# Patient Record
Sex: Female | Born: 1960 | Hispanic: Yes | Marital: Married | Smoking: Never smoker
Health system: Southern US, Community
[De-identification: ages and names within clinical notes are randomized; demographics above are authoritative.]

## PROBLEM LIST (undated history)

## (undated) DIAGNOSIS — IMO0002 Reserved for concepts with insufficient information to code with codable children: Secondary | ICD-10-CM

## (undated) DIAGNOSIS — K750 Abscess of liver: Secondary | ICD-10-CM

## (undated) DIAGNOSIS — M8580 Other specified disorders of bone density and structure, unspecified site: Secondary | ICD-10-CM

## (undated) DIAGNOSIS — K219 Gastro-esophageal reflux disease without esophagitis: Secondary | ICD-10-CM

## (undated) DIAGNOSIS — R0789 Other chest pain: Secondary | ICD-10-CM

## (undated) DIAGNOSIS — R590 Localized enlarged lymph nodes: Secondary | ICD-10-CM

## (undated) DIAGNOSIS — S161XXA Strain of muscle, fascia and tendon at neck level, initial encounter: Secondary | ICD-10-CM

## (undated) DIAGNOSIS — E119 Type 2 diabetes mellitus without complications: Secondary | ICD-10-CM

## (undated) DIAGNOSIS — G43909 Migraine, unspecified, not intractable, without status migrainosus: Secondary | ICD-10-CM

## (undated) DIAGNOSIS — R739 Hyperglycemia, unspecified: Secondary | ICD-10-CM

## (undated) DIAGNOSIS — K839 Disease of biliary tract, unspecified: Secondary | ICD-10-CM

## (undated) DIAGNOSIS — I10 Essential (primary) hypertension: Secondary | ICD-10-CM

## (undated) DIAGNOSIS — K635 Polyp of colon: Secondary | ICD-10-CM

## (undated) DIAGNOSIS — E7800 Pure hypercholesterolemia, unspecified: Secondary | ICD-10-CM

## (undated) DIAGNOSIS — S3613XA Injury of bile duct, initial encounter: Secondary | ICD-10-CM

## (undated) DIAGNOSIS — K2270 Barrett's esophagus without dysplasia: Secondary | ICD-10-CM

## (undated) DIAGNOSIS — R112 Nausea with vomiting, unspecified: Secondary | ICD-10-CM

## (undated) DIAGNOSIS — K812 Acute cholecystitis with chronic cholecystitis: Secondary | ICD-10-CM

## (undated) DIAGNOSIS — Z9889 Other specified postprocedural states: Secondary | ICD-10-CM

## (undated) HISTORY — DX: Hyperglycemia, unspecified: R73.9

## (undated) HISTORY — DX: Strain of muscle, fascia and tendon at neck level, initial encounter: S16.1XXA

## (undated) HISTORY — DX: Barrett's esophagus without dysplasia: K22.70

## (undated) HISTORY — DX: Polyp of colon: K63.5

## (undated) HISTORY — PX: BLADDER SURGERY: SHX569

## (undated) HISTORY — DX: Other chest pain: R07.89

## (undated) HISTORY — PX: BREAST CYST ASPIRATION: SHX578

## (undated) HISTORY — DX: Abscess of liver: K75.0

## (undated) HISTORY — DX: Acute cholecystitis with chronic cholecystitis: K81.2

## (undated) HISTORY — DX: Pure hypercholesterolemia, unspecified: E78.00

## (undated) HISTORY — DX: Localized enlarged lymph nodes: R59.0

## (undated) HISTORY — DX: Gastro-esophageal reflux disease without esophagitis: K21.9

## (undated) HISTORY — DX: Other specified disorders of bone density and structure, unspecified site: M85.80

## (undated) HISTORY — DX: Injury of bile duct, initial encounter: S36.13XA

## (undated) HISTORY — DX: Reserved for concepts with insufficient information to code with codable children: IMO0002

## (undated) HISTORY — DX: Disease of biliary tract, unspecified: K83.9

## (undated) HISTORY — DX: Essential (primary) hypertension: I10

---

## 2006-06-20 LAB — CONVERTED CEMR LAB: Pap Smear: NORMAL

## 2007-02-20 HISTORY — PX: ABDOMINAL HYSTERECTOMY: SHX81

## 2008-02-20 DIAGNOSIS — IMO0002 Reserved for concepts with insufficient information to code with codable children: Secondary | ICD-10-CM

## 2008-02-20 HISTORY — DX: Reserved for concepts with insufficient information to code with codable children: IMO0002

## 2008-02-20 HISTORY — PX: COLONOSCOPY W/ POLYPECTOMY: SHX1380

## 2009-02-19 HISTORY — PX: COLONOSCOPY W/ POLYPECTOMY: SHX1380

## 2009-06-06 ENCOUNTER — Encounter (INDEPENDENT_AMBULATORY_CARE_PROVIDER_SITE_OTHER): Payer: Self-pay | Admitting: *Deleted

## 2009-06-14 LAB — HM COLONOSCOPY: HM Colonoscopy: NORMAL

## 2009-06-16 ENCOUNTER — Emergency Department (HOSPITAL_BASED_OUTPATIENT_CLINIC_OR_DEPARTMENT_OTHER): Admission: EM | Admit: 2009-06-16 | Discharge: 2009-06-16 | Payer: Self-pay | Admitting: Emergency Medicine

## 2009-06-17 ENCOUNTER — Ambulatory Visit: Payer: Self-pay | Admitting: Family

## 2009-06-28 ENCOUNTER — Ambulatory Visit: Payer: Self-pay | Admitting: Family

## 2009-07-04 ENCOUNTER — Ambulatory Visit: Payer: Self-pay | Admitting: Family

## 2009-07-04 DIAGNOSIS — E559 Vitamin D deficiency, unspecified: Secondary | ICD-10-CM | POA: Insufficient documentation

## 2009-07-04 DIAGNOSIS — Z9189 Other specified personal risk factors, not elsewhere classified: Secondary | ICD-10-CM | POA: Insufficient documentation

## 2009-07-04 LAB — CONVERTED CEMR LAB
ALT: 15 units/L (ref 0–35)
AST: 17 units/L (ref 0–37)
Albumin: 4.5 g/dL (ref 3.5–5.2)
Alkaline Phosphatase: 92 units/L (ref 39–117)
BUN: 13 mg/dL (ref 6–23)
Basophils Absolute: 0 10*3/uL (ref 0.0–0.1)
Basophils Relative: 1 % (ref 0–1)
CO2: 28 meq/L (ref 19–32)
Calcium: 10.1 mg/dL (ref 8.4–10.5)
Chloride: 105 meq/L (ref 96–112)
Cholesterol: 149 mg/dL (ref 0–200)
Creatinine, Ser: 0.6 mg/dL (ref 0.40–1.20)
Eosinophils Absolute: 0.1 10*3/uL (ref 0.0–0.7)
Eosinophils Relative: 3 % (ref 0–5)
Glucose, Bld: 99 mg/dL (ref 70–99)
HCT: 40.1 % (ref 36.0–46.0)
HDL: 58 mg/dL (ref >39)
Hemoglobin: 13.1 g/dL (ref 12.0–15.0)
LDL Cholesterol: 71 mg/dL (ref 0–99)
Lymphocytes Relative: 45 % (ref 12–46)
Lymphs Abs: 1.7 10*3/uL (ref 0.7–4.0)
MCHC: 32.7 g/dL (ref 30.0–36.0)
MCV: 83.5 fL (ref 78.0–100.0)
Monocytes Absolute: 0.2 10*3/uL (ref 0.1–1.0)
Monocytes Relative: 5 % (ref 3–12)
Neutro Abs: 1.8 10*3/uL (ref 1.7–7.7)
Neutrophils Relative %: 47 % (ref 43–77)
Platelets: 317 10*3/uL (ref 150–400)
Potassium: 5 meq/L (ref 3.5–5.3)
RBC: 4.8 M/uL (ref 3.87–5.11)
RDW: 13.4 % (ref 11.5–15.5)
Sodium: 141 meq/L (ref 135–145)
TSH: 0.573 microintl units/mL (ref 0.350–4.500)
Total Bilirubin: 0.5 mg/dL (ref 0.3–1.2)
Total CHOL/HDL Ratio: 2.6
Total Protein: 7.4 g/dL (ref 6.0–8.3)
Triglycerides: 101 mg/dL (ref <150)
VLDL: 20 mg/dL (ref 0–40)
Vit D, 1,25-Dihydroxy: 23 — ABNORMAL LOW (ref 30–89)
WBC: 3.8 10*3/uL — ABNORMAL LOW (ref 4.0–10.5)

## 2009-07-05 ENCOUNTER — Encounter (INDEPENDENT_AMBULATORY_CARE_PROVIDER_SITE_OTHER): Payer: Self-pay | Admitting: *Deleted

## 2009-07-05 ENCOUNTER — Telehealth: Payer: Self-pay | Admitting: Family

## 2009-07-06 ENCOUNTER — Telehealth: Payer: Self-pay | Admitting: Family

## 2009-07-11 ENCOUNTER — Encounter: Payer: Self-pay | Admitting: Family

## 2009-07-11 DIAGNOSIS — Z8711 Personal history of peptic ulcer disease: Secondary | ICD-10-CM | POA: Insufficient documentation

## 2009-07-11 DIAGNOSIS — Z8719 Personal history of other diseases of the digestive system: Secondary | ICD-10-CM | POA: Insufficient documentation

## 2009-07-11 DIAGNOSIS — M8580 Other specified disorders of bone density and structure, unspecified site: Secondary | ICD-10-CM | POA: Insufficient documentation

## 2009-07-25 ENCOUNTER — Telehealth: Payer: Self-pay | Admitting: Family

## 2009-08-26 ENCOUNTER — Encounter: Payer: Self-pay | Admitting: Family

## 2009-08-26 LAB — HM MAMMOGRAPHY: HM Mammogram: NORMAL

## 2009-08-30 ENCOUNTER — Encounter: Payer: Self-pay | Admitting: Family

## 2009-09-01 ENCOUNTER — Encounter: Payer: Self-pay | Admitting: Cardiology

## 2009-09-01 ENCOUNTER — Emergency Department (HOSPITAL_BASED_OUTPATIENT_CLINIC_OR_DEPARTMENT_OTHER)
Admission: EM | Admit: 2009-09-01 | Discharge: 2009-09-01 | Payer: Self-pay | Source: Home / Self Care | Admitting: Emergency Medicine

## 2009-09-05 ENCOUNTER — Telehealth: Payer: Self-pay | Admitting: Family

## 2009-09-14 ENCOUNTER — Encounter: Payer: Self-pay | Admitting: Cardiology

## 2009-09-14 ENCOUNTER — Ambulatory Visit: Payer: Self-pay | Admitting: Cardiology

## 2009-09-26 ENCOUNTER — Ambulatory Visit: Payer: Self-pay | Admitting: Family

## 2009-09-26 DIAGNOSIS — E785 Hyperlipidemia, unspecified: Secondary | ICD-10-CM | POA: Insufficient documentation

## 2009-09-26 LAB — CONVERTED CEMR LAB
ALT: 18 units/L (ref 0–35)
AST: 22 units/L (ref 0–37)
Albumin: 4.5 g/dL (ref 3.5–5.2)
Alkaline Phosphatase: 91 units/L (ref 39–117)
Bilirubin, Direct: 0.1 mg/dL (ref 0.0–0.3)
Cholesterol: 196 mg/dL (ref 0–200)
HDL: 53 mg/dL (ref >39)
Indirect Bilirubin: 0.3 mg/dL (ref 0.0–0.9)
LDL Cholesterol: 127 mg/dL — ABNORMAL HIGH (ref 0–99)
Total Bilirubin: 0.4 mg/dL (ref 0.3–1.2)
Total CHOL/HDL Ratio: 3.7
Total Protein: 7.1 g/dL (ref 6.0–8.3)
Triglycerides: 79 mg/dL (ref <150)
VLDL: 16 mg/dL (ref 0–40)
Vit D, 1,25-Dihydroxy: 22 — ABNORMAL LOW (ref 30–89)

## 2009-09-27 ENCOUNTER — Telehealth: Payer: Self-pay | Admitting: Family

## 2009-10-07 ENCOUNTER — Telehealth: Payer: Self-pay | Admitting: Family

## 2009-10-10 ENCOUNTER — Ambulatory Visit: Payer: Self-pay | Admitting: Internal Medicine

## 2009-10-10 ENCOUNTER — Encounter: Payer: Self-pay | Admitting: Family

## 2009-10-12 ENCOUNTER — Telehealth: Payer: Self-pay | Admitting: Family

## 2009-11-01 ENCOUNTER — Telehealth: Payer: Self-pay | Admitting: Family

## 2009-11-07 ENCOUNTER — Telehealth: Payer: Self-pay | Admitting: Family

## 2009-11-23 ENCOUNTER — Ambulatory Visit: Payer: Self-pay | Admitting: Family

## 2009-11-23 DIAGNOSIS — S139XXA Sprain of joints and ligaments of unspecified parts of neck, initial encounter: Secondary | ICD-10-CM | POA: Insufficient documentation

## 2009-11-23 LAB — CONVERTED CEMR LAB: Vit D, 1,25-Dihydroxy: 50 (ref 30–89)

## 2009-11-25 ENCOUNTER — Telehealth: Payer: Self-pay | Admitting: Family

## 2010-01-02 ENCOUNTER — Encounter: Payer: Self-pay | Admitting: Internal Medicine

## 2010-01-02 ENCOUNTER — Ambulatory Visit: Payer: Self-pay | Admitting: Family

## 2010-01-02 DIAGNOSIS — R1013 Epigastric pain: Secondary | ICD-10-CM | POA: Insufficient documentation

## 2010-01-02 LAB — CONVERTED CEMR LAB
ALT: 10 units/L (ref 0–35)
AST: 16 units/L (ref 0–37)
Albumin: 4.3 g/dL (ref 3.5–5.2)
Alkaline Phosphatase: 90 units/L (ref 39–117)
Amylase: 46 units/L (ref 0–105)
Basophils Absolute: 0 10*3/uL (ref 0.0–0.1)
Basophils Relative: 1 % (ref 0–1)
Bilirubin, Direct: 0.1 mg/dL (ref 0.0–0.3)
Eosinophils Absolute: 0 10*3/uL (ref 0.0–0.7)
Eosinophils Relative: 0 % (ref 0–5)
HCT: 39.6 % (ref 36.0–46.0)
Hemoglobin: 12.9 g/dL (ref 12.0–15.0)
Indirect Bilirubin: 0.3 mg/dL (ref 0.0–0.9)
Lipase: 25 units/L (ref 0–75)
Lymphocytes Relative: 47 % — ABNORMAL HIGH (ref 12–46)
Lymphs Abs: 1.9 10*3/uL (ref 0.7–4.0)
MCHC: 32.6 g/dL (ref 30.0–36.0)
MCV: 85.7 fL (ref 78.0–100.0)
Monocytes Absolute: 0.3 10*3/uL (ref 0.1–1.0)
Monocytes Relative: 7 % (ref 3–12)
Neutro Abs: 1.9 10*3/uL (ref 1.7–7.7)
Neutrophils Relative %: 46 % (ref 43–77)
Platelets: 303 10*3/uL (ref 150–400)
RBC: 4.62 M/uL (ref 3.87–5.11)
RDW: 14 % (ref 11.5–15.5)
Total Bilirubin: 0.4 mg/dL (ref 0.3–1.2)
Total Protein: 7 g/dL (ref 6.0–8.3)
WBC: 4.2 10*3/uL (ref 4.0–10.5)

## 2010-01-03 ENCOUNTER — Telehealth: Payer: Self-pay | Admitting: Family

## 2010-01-04 ENCOUNTER — Encounter: Payer: Self-pay | Admitting: Family

## 2010-01-05 ENCOUNTER — Telehealth: Payer: Self-pay | Admitting: Internal Medicine

## 2010-01-25 ENCOUNTER — Telehealth: Payer: Self-pay | Admitting: Family

## 2010-01-25 DIAGNOSIS — Z860100 Personal history of colon polyps, unspecified: Secondary | ICD-10-CM | POA: Insufficient documentation

## 2010-01-25 DIAGNOSIS — I10 Essential (primary) hypertension: Secondary | ICD-10-CM | POA: Insufficient documentation

## 2010-01-25 DIAGNOSIS — K219 Gastro-esophageal reflux disease without esophagitis: Secondary | ICD-10-CM | POA: Insufficient documentation

## 2010-01-25 DIAGNOSIS — K5900 Constipation, unspecified: Secondary | ICD-10-CM | POA: Insufficient documentation

## 2010-01-30 ENCOUNTER — Ambulatory Visit: Payer: Self-pay | Admitting: Internal Medicine

## 2010-02-03 ENCOUNTER — Ambulatory Visit: Payer: Self-pay | Admitting: Internal Medicine

## 2010-02-07 ENCOUNTER — Encounter: Payer: Self-pay | Admitting: Internal Medicine

## 2010-03-21 NOTE — Progress Notes (Signed)
  Phone Note Outgoing Call   Summary of Call: Please call patient and let her know that her bone density does show some bone thinning (osteopenia) not yet ostoporosis.  She should continue calcium supplements, vitamin D, and try to incorporate weight bearing exercises into her daily routine.  She will need a follow up test in 2 years.   Initial call taken by: Lemont Fillers FNP,  November 01, 2009 4:38 PM      Preventive Care Screening     dexa scan 10/10/09 Osteopenia- femoral neck R -1.1, left -1.4, spine -1.3   Appended Document:  Left message on machine to return my call.   Appended Document:  Pt notified and requests instructions be mailed to her. Instructions mailed.

## 2010-03-21 NOTE — Assessment & Plan Note (Signed)
Summary: 3 MONTH FOLLOW UP/DK--Rm 4   Vital Signs:  Patient profile:   50 year old female Height:      61 inches Weight:      152.25 pounds BMI:     28.87 Temp:     98.3 degrees F oral Pulse rate:   72 / minute Pulse rhythm:   regular Resp:     16 per minute BP sitting:   110 / 80  (right arm) Cuff size:   regular  Vitals Entered By: Mervin Kung CMA Duncan Dull) (September 26, 2009 9:19 AM) CC: Room 4  3 month follow up, fasting.  Pt  states she has been having pain in the back of her neck, has taken percocet in the past with relief. Is Patient Diabetic? No Comments Pt needs refill on Lactulose. States she takes Vitamin D when she remembers it. Nicki Guadalajara Fergerson CMA Duncan Dull)  September 26, 2009 9:23 AM    Primary Care Provider:  Lemont Fillers FNP  CC:  Room 4  3 month follow up, fasting.  Pt  states she has been having pain in the back of her neck, and has taken percocet in the past with relief..  History of Present Illness: Ms Tsang is a 50 year old female who presents today for follow up. She reports that since last visit she was seen in the ED for a UTI, but her symptoms have resolved.   1)Atypical CP- improved- scheduled for stress echo with Dr. Jens Som.  2)Vitamin D Deficiency-  Notes that she sometimes forgets to take her vitamin D supplement.  Feels like it makes her dizzy at times.    3)GERD-  reports rare symptoms of reflux,  occasionally notices when she forgets to take omeprazole  Allergies: 1)  ! Codeine  Past History:  Past Medical History: Last updated: 09/14/2009 OSTEOPENIA BARRETT'S ESOPHAGUS, HX OF  VITAMIN D DEFICIENCY CERVICAL LYMPHADENOPATHY, LEFT  Ulcer--2010 Hypercholesterolemia Urine Incontinence ?PFO  Past Surgical History: Last updated: 06/17/2009 Bladder Mesh surgery?-- 2010, Dr. Noland Fordyce, Colon polypectomy--2010 Hysterectomy--2009, Dr. Ferdinand Cava  Family History: Last updated: 09/14/2009 diabetes--sister colon  cancer--father PE--mother,deceased HTN--brother,sister, father, mother Hypercholesterolemia--brother,sister,father,mother Mouth cancer--paternal grandmother Thyroid problem-- 1/62 sister 60 year old daughter-healthy 20-  healthy.   Brother with MI at age 51 Father with MI at age 53  Social History: Last updated: 07/04/2009 Married- is guardian for two grandchildren ages 25 and 7. Never Smoked Alcohol use-no Regular exercise-no Works in housekeeping at [employer]  Risk Factors: Alcohol Use: 0 (06/17/2009) Caffeine Use: 1 cup (06/17/2009) Exercise: no (06/17/2009)  Risk Factors: Smoking Status: never (06/17/2009)  Physical Exam  General:  Well-developed,well-nourished,in no acute distress; alert,appropriate and cooperative throughout examination Head:  Normocephalic and atraumatic without obvious abnormalities. No apparent alopecia or balding. Abdomen:  Bowel sounds positive,abdomen soft and non-tender without masses, organomegaly or hernias noted. Extremities:  No clubbing, cyanosis, edema, or deformity noted with normal full range of motion of all joints.     Impression & Recommendations:  Problem # 1:  OSTEOPENIA (ICD-733.90) Assessment Comment Only Due for dexa scan.  Will order, will also have patient add a daily calcium supplement.  Problem # 2:  VITAMIN D DEFICIENCY (ICD-268.9) Assessment: Comment Only Check vitamin D Level Orders: T-Vitamin D (25-Hydroxy) (04540-98119)  Problem # 3:  CHEST PAIN, ATYPICAL, HX OF (ICD-V15.89) Assessment: Improved Plan for stress echo- per Dr. Jens Som.    Problem # 4:  HYPERLIPIDEMIA (ICD-272.4) Assessment: Comment Only Check flp today. Recently started pravastatin in  place of Simvastatin.   Her updated medication list for this problem includes:    Pravastatin Sodium 40 Mg Tabs (Pravastatin sodium) ..... One tablet by mouth daily at bedtime  Orders: TLB-Lipid Panel (80061-LIPID) TLB-Hepatic/Liver Function Pnl  (80076-HEPATIC)  Complete Medication List: 1)  Omeprazole 40 Mg Cpdr (Omeprazole) .... Take 1 tablet by mouth once a day 2)  Pravastatin Sodium 40 Mg Tabs (Pravastatin sodium) .... One tablet by mouth daily at bedtime 3)  Lactulose 10 Gm/73ml Soln (Lactulose) .Marland Kitchen.. 1 tblsp daily 4)  Ergocalciferol 50000 Unit Caps (Ergocalciferol) .... One cap by mouth once weekly for 12 weeks 5)  Caltrate 600+d 600-400 Mg-unit Tabs (Calcium carbonate-vitamin d) .... One tablet by mouth twice daily  Patient Instructions: 1)  Please arrange a bone density scan at the front desk on your way out. (733.90) 2)  Please complete your lab work downstairs today. 3)  Follow up in 3 months.  Prescriptions: PRAVASTATIN SODIUM 40 MG TABS (PRAVASTATIN SODIUM) one tablet by mouth daily at bedtime  #30 x 5   Entered and Authorized by:   Lemont Fillers FNP   Signed by:   Lemont Fillers FNP on 09/26/2009   Method used:   Electronically to        [facility] ([facility]ail)       [address]       [place], [place]  [address]       Ph: [phone]       Fax: [phone]   RxID:   [id number]    Current Allergies (reviewed today): ! CODEINE

## 2010-03-21 NOTE — Assessment & Plan Note (Signed)
Summary: [facility]   Visit Type:  Initial Consult  CC:  Atypical chest pain.  History of Present Illness: 50 year old female for evaluation of chest pain. No prior cardiac history. The patient has had intermittent chest pain for approximately 5 years. The pain is substernal and described as a tightness. It radiates to her left shoulder and upper arm. It is not pleuritic, positional, exertional or related to food. There are no associated symptoms. It can last all day. It resolved spontaneously. She does not have exertional chest pain, dyspnea on exertion, orthopnea, PND, pedal edema or syncope. Because of the above we were asked to further evaluate.  Current Medications (verified): 1)  Omeprazole 40 Mg Cpdr (Omeprazole) .... Take 1 Tablet By Mouth Once A Day 2)  Pravastatin Sodium 40 Mg Tabs (Pravastatin Sodium) .... One Tablet By Mouth Daily At Bedtime 3)  Lactulose 10 Gm/7m Soln (Lactulose) ..Marland Kitchen. 1 Tblsp Daily 4)  Ergocalciferol 50000 Unit Caps (Ergocalciferol) .... One Cap By Mouth Once Weekly For 12 Weeks  Allergies: 1)  ! Codeine  Past History:  Past Medical History: OSTEOPENIA BARRETT'S ESOPHAGUS, HX OF  VITAMIN D DEFICIENCY CERVICAL LYMPHADENOPATHY, LEFT  Ulcer--2010 Hypercholesterolemia Urine Incontinence ?PFO  Past Surgical History: Reviewed history from 06/17/2009 and no changes required. Bladder Mesh surgery?-- 2010, Dr. LRhodia Albright Colon polypectomy--2010 Hysterectomy--2009, Dr. ABethann Goo Family History: Reviewed history from 07/04/2009 and no changes required. diabetes--sister colon cancer--father PE--mother,deceased HTN--brother,sister, father, mother Hypercholesterolemia--brother,sister,father,mother Mouth cancer--paternal grandmother Thyroid problem-- 1/243sister 32year old daughter-healthy 240  healthy.   Brother with MI at age 3580Father with MI at age 638 Social History: Reviewed history from 07/04/2009 and no changes required. Married- is  guardian for two grandchildren ages 11and 138 Never Smoked Alcohol use-no Regular exercise-no Works in housekeeping at [employer]      no fevers or chills, productive cough, hemoptysis, dysphasia, odynophagia, melena, hematochezia, dysuria, hematuria, rash, seizure activity, orthopnea, PND, pedal edema, claudication. Remaining systems are negative.   Vital Signs:  Patient profile:   50year old female Height:      61 inches Weight:      152 pounds BMI:     28.82 Pulse rate:   89 / minute Pulse rhythm:   regular Resp:     18 per minute BP sitting:   124 / 84  (left arm) Cuff size:   large  Vitals Entered By: LSidney Ace(September 14, 2009 9:03 AM)  Physical Exam  General:  Well developed/well nourished in NAD Skin warm/dry Patient not depressed No peripheral clubbing Back-normal HEENT-normal/normal eyelids Neck supple/normal carotid upstroke bilaterally; no bruits; no JVD; no thyromegaly chest - CTA/ normal expansion CV - RRR/normal S1 and S2; no murmurs, rubs or gallops;  PMI nondisplaced Abdomen -NT/ND, no HSM, no mass, + bowel sounds, no bruit 2+ femoral pulses, no bruits Ext-no edema, chords, 2+ DP Neuro-grossly nonfocal      EKG  Procedure date:  09/14/2009  Findings:      Normal sinus rhythm at a rate of 89. Axis normal. Nonspecific ST changes.  Impression & Recommendations:  Problem # 1:  CHEST PAIN, ATYPICAL, HX OF (ICD-V15.89) Symptoms atypical. However she has a strong family history. Schedule stress echocardiogram. If negative no further cardiac evaluation. Orders: EKG w/ Interpretation (93000) Stress Echo (Stress Echo)  Problem # 2:  BARRETT'S ESOPHAGUS, HX OF (ICD-V12.79) Some symptoms may be secondary to reflux. Management per primary care.  Patient Instructions: 1)  Your physician recommends that you schedule a follow-up appointment in: AS NEEDED 2)  Your physician recommends that you continue on your current  medications as directed. Please refer to the Current Medication list given to you today. 3)  Your physician has requested that you have a stress echocardiogram. For further information please visit [email or web address].  Please follow instruction sheet as given.

## 2010-03-21 NOTE — Progress Notes (Signed)
----   Converted from flag ---- ---- 07/25/2009 4:16 PM, Lannette Donath wrote: SW pt's husband, only wants to see cardiologist in [facility] on either a Monday or Friday. Can you recommend another physician? Thanks, Diane ------------------------------ Myriam Jacobson, could you please arrange referrral to a [facility] cardiologist.  Thanks

## 2010-03-21 NOTE — Progress Notes (Signed)
Summary: triage  Phone Note From Other Clinic Call back at [phone]   Caller: Bonnita Nasuti, ref coor Call For: Dr. Olevia Perches Reason for Call: Schedule Patient Appt Summary of Call: Debbrah Alar, NP would like pt seen sooner for epigastric pain... pt requested Dr. Olevia Perches specifically... no GI hx... Melissa asked if pt could be seen by our PA or NP Initial call taken by: Lucien Mons,  January 05, 2010 8:43 AM  Follow-up for Phone Call        patient wanted to bee seen by Olevia Perches on 01/27/10 or 12/12 .  I have rescheduled the patient with Dr Olevia Perches for 01/30/10 8:45.  They were offered an earlier appointment with an extender with [facility] supervising, but the patient had specific days she requested.   Follow-up by: Barb Merino RN, [facility],  January 05, 2010 10:43 AM

## 2010-03-21 NOTE — Letter (Signed)
Summary: Physician Documentation Sheet  Physician Documentation Sheet   Imported By: Ranell Patrick 09/12/2009 11:04:38  _____________________________________________________________________  External Attachment:    Type:   Image     Comment:   External Document

## 2010-03-21 NOTE — Progress Notes (Signed)
Summary: cardiology referral  Phone Note Call from Patient   Caller: Patient's Husband Call For: [facility] Summary of Call: Pt's husband returned Helen's call re: cardiology referral.  I gave him appt. date/time and he stated that would not work. She was wanting to get appt. set for 08/01/09. Verified with Sandford Craze, NP that it is ok to wait until then for appt.  Please call pt. with new appt. time.  Mervin Kung CMA  Jul 06, 2009 9:49 AM   Follow-up for Phone Call        SW pt's husband, he needs Myriam Jacobson to [facility] with new appt info for 08/01/09 appt [phone], see Tricia's phone note Follow-up by: Lannette Donath,  Jul 06, 2009 11:38 AM  Additional Follow-up for Phone Call Additional follow up Details #1::        Pt's husband called back & said that his wife can keep the 07/15/09 appt with the cardiologist only if the appt can be in our office, if the appt is at another location it needs to be changed to 08/01/09, pls call pt to verify appt Diane Tomerlin  Jul 06, 2009 11:47 AM    Additional Follow-up for Phone Call Additional follow up Details #2::    Spoke to pt. and verified cardiology appt for 07/15/09.  Pt is aware that appt. is at another location.  Dr. name, address, date and time given to pt.  Mervin Kung CMA  Jul 06, 2009 12:25 PM

## 2010-03-21 NOTE — Miscellaneous (Signed)
Summary: Appointment Canceled  Appointment status changed to canceled by [employer] on 09/26/2009 9:54 AM.  Cancellation Comments --------------------- stress echo/v15.89/umr/no prec. req/saf  Appointment Information ----------------------- Appt Type:  CARDIOLOGY ANCILLARY VISIT      Date:  Friday, October 07, 2009      Time:  10:30 AM for 60 min   Urgency:  Routine   Made By:  Pearson Grippe  To Visit:  [id number]    Reason:  stress echo/v15.89/umr/no prec. req/saf  Appt Comments ------------- -- 09/26/09 9:54: ([facility]) CANCELED -- stress echo/v15.89/umr/no prec. req/saf -- 09/14/09 10:06: ([facility]) BOOKED -- Routine CARDIOLOGY ANCILLARY VISIT at 10/07/2009 10:30 AM for 60 min stress echo/v15.89/umr/no prec. req/saf -- 09/14/09 9:31: ([facility]) BOOKED

## 2010-03-21 NOTE — Letter (Signed)
Summary: New Patient letter  [facility]  [address] [place], [place] [address]   Phone: [phone]  Fax: [phone]       01/02/2010 MRN: [id number]  Whitney Neal [address] [place], [place]  [address]  Dear Ms. Scollard,  Welcome to the Gastroenterology Division at [facility].    You are scheduled to see Dr.  Juanda Chance on 03-10-2010 at 1:30pm on the 3rd floor at [facility], [address]  We ask that you try to arrive at our office 15 minutes prior to your appointment time to allow for check-in.  We would like you to complete the enclosed self-administered evaluation form prior to your visit and bring it with you on the day of your appointment.  We will review it with you.  Also, please bring a complete list of all your medications or, if you prefer, bring the medication bottles and we will list them.  Please bring your insurance card so that we may make a copy of it.  If your insurance requires a referral to see a specialist, please bring your referral form from your primary care physician.  Co-payments are due at the time of your visit and may be paid by cash, check or credit card.     Your office visit will consist of a consult with your physician (includes a physical exam), any laboratory testing he/she may order, scheduling of any necessary diagnostic testing (e.g. x-ray, ultrasound, CT-scan), and scheduling of a procedure (e.g. Endoscopy, Colonoscopy) if required.  Please allow enough time on your schedule to allow for any/all of these possibilities.    If you cannot keep your appointment, please call [phone] to cancel or reschedule prior to your appointment date.  This allows [place] the opportunity to schedule an appointment for another patient in need of care.  If you do not cancel or reschedule by 5 p.m. the business day prior to your appointment date, you will be charged a $50.00 late cancellation/no-show fee.    Thank you for  choosing [facility] for your medical needs.  We appreciate the opportunity to care for you.  Please visit [place] at our website  to learn more about our practice.                     Sincerely,                                                             The Gastroenterology Division

## 2010-03-21 NOTE — Progress Notes (Signed)
Summary: lab result  Phone Note Outgoing Call Call back at [phone]--cell   Summary of Call: Please call patient and let her know that cholesterol looks good.  Vitamin D level is still low.  She should continue the once weekly supplement x 8 more weeks and then return for a f/u vitamin D level.  Thanks Initial call taken by: Lemont Fillers FNP,  September 27, 2009 8:15 AM  Follow-up for Phone Call        Left message on cell phone to return my call.  Nicki Guadalajara Fergerson CMA Duncan Dull)  September 27, 2009 9:19 AM   Left detailed message on cell# re: Whitney Neal's instructions. Advised pt order has been sent to the lab for repeat vit d in 8 weeks and to call if she has any questions.  Nicki Guadalajara Fergerson CMA Duncan Dull)  September 28, 2009 3:34 PM     Prescriptions: ERGOCALCIFEROL 50000 UNIT CAPS (ERGOCALCIFEROL) one cap by mouth once weekly for 12 weeks  #8 x 0   Entered and Authorized by:   Lemont Fillers FNP   Signed by:   Lemont Fillers FNP on 09/27/2009   Method used:   Electronically to        [facility] (retail)       [address]       [place], [place]  [address]       Ph: [phone]       Fax: [phone]   RxID:   [id number]

## 2010-03-21 NOTE — Progress Notes (Signed)
Summary: Cholesterol med change  Phone Note Refill Request Call back at [phone]--cell Message from:  Fax from Margarito Liner on September 05, 2009 12:16 PM  Refills Requested: Medication #1:  SIMVASTATIN 80 MG TABS Take 1 tablet by mouth once a day   Dosage confirmed as above?Dosage Confirmed   Supply Requested: 3 months   Last Refilled: 08/08/2009 Please advise if it is ok to dispense this strength for pt?  Next Appointment Scheduled: 09/26/09-- Sandford Craze, NP Initial call taken by: Mervin Kung CMA Duncan Dull),  September 05, 2009 12:16 PM  Follow-up for Phone Call        I am switching to pravachol.  Please notify pt that this is due to new study recommending against 80mg  of simvastatin.   Follow-up by: Lemont Fillers FNP,  September 05, 2009 1:14 PM  Additional Follow-up for Phone Call Additional follow up Details #1::        Left message with pt's husband to have pt return my call.  Nicki Guadalajara Fergerson CMA Duncan Dull)  September 05, 2009 3:03 PM   Left message on pt's cell to return my call.  Nicki Guadalajara Fergerson CMA Duncan Dull)  September 06, 2009 12:48 PM     Additional Follow-up for Phone Call Additional follow up Details #2::    Spoke with pt and notified her of the med change. Pt states she has been hurting all over.  Advised pt to stop all cholesterol med x 2 weeks then start Pravastatin per. Pt instructed to call the office if she develops muscle pain after starting the Pravastatin per Cerra Eisenhower O'Sullivan,NP.  Nicki Guadalajara Fergerson CMA Duncan Dull)  September 07, 2009 8:32 AM   New/Updated Medications: PRAVASTATIN SODIUM 40 MG TABS (PRAVASTATIN SODIUM) one tablet by mouth daily at bedtime Prescriptions: PRAVASTATIN SODIUM 40 MG TABS (PRAVASTATIN SODIUM) one tablet by mouth daily at bedtime  #30 x 1   Entered and Authorized by:   Lemont Fillers FNP   Signed by:   Lemont Fillers FNP on 09/05/2009   Method used:   Electronically to        [facility] (retail)       [address]       [place], [place]  [address]       Ph: [phone]       Fax: [phone]   RxID:   [id number]

## 2010-03-21 NOTE — Miscellaneous (Signed)
Summary: BONE DENSITY  Clinical Lists Changes  Orders: Added new Test order of T-Bone Densitometry (77080) - Signed Added new Test order of T-Lumbar Vertebral Assessment (77082) - Signed 

## 2010-03-21 NOTE — Progress Notes (Signed)
Summary: lab result, card referral confirmation  Phone Note Outgoing Call Call back at [phone]--cell   Summary of Call: Please call patient and let her know that her labs look good, except that her vitamin D is low.  I would like her to resume the once weekly vitamin D and plan to follow up in 3 months for appointment please. Initial call taken by: Lemont Fillers FNP,  Jul 05, 2009 10:33 AM  Follow-up for Phone Call        Left message with daughter to return my call.   Mervin Kung CMA  Jul 05, 2009 10:42 AM   Left message with pt's husband for pt to return my call.  Mervin Kung CMA  Jul 06, 2009 9:46 AM   Pt left message to call her on her cell phone @ [phone].  Advised pt. of lab results and need to start Vit D.  Pt states she will call back in the a.m. to schedule 3 month f/u after she checks her schedule.  I notified her that I spoke to her husband re: cardiology appt for 07/15/09 and he has said that was not a good time.  Per pt., she is ok to keep appt for 07/15/09.   Mervin Kung CMA  Jul 06, 2009 12:21 PM     New/Updated Medications: ERGOCALCIFEROL 50000 UNIT CAPS (ERGOCALCIFEROL) one cap by mouth once weekly for 12 weeks Prescriptions: ERGOCALCIFEROL 50000 UNIT CAPS (ERGOCALCIFEROL) one cap by mouth once weekly for 12 weeks  #12 x 0   Entered and Authorized by:   Lemont Fillers FNP   Signed by:   Lemont Fillers FNP on 07/05/2009   Method used:   Electronically to        [facility] (retail)       [address]       [place], [place]  [address]       Ph: [phone]       Fax: [phone]   RxID:   [id number]

## 2010-03-21 NOTE — Assessment & Plan Note (Signed)
Summary: 1 WEEK FOLLOW UP/MHF   Vital Signs:  Patient profile:   50 year old female Height:      61 inches Weight:      152 pounds BMI:     28.82 Temp:     98.1 degrees F oral Pulse rate:   72 / minute Pulse rhythm:   regular Resp:     12 per minute BP sitting:   108 / 66  (right arm) Cuff size:   regular  Vitals Entered By: Mervin Kung CMA (Jun 28, 2009 8:10 AM) CC: room 5  1 week follow up. Pt states swelling in neck has resolved. Is Patient Diabetic? No   CC:  room 5  1 week follow up. Pt states swelling in neck has resolved.[facility]  History of Present Illness: Whitney Neal is a 50 year old female who presents today for follow up of her cervical LAD.  Last visit she had swelling and tenderness on the left side of her neck.  She was placed on Augmentin.  Pt reports that symptoms resolved within 72 hours of starting antibiotics.  Denies current pain, swelling or fever.    Allergies: 1)  ! Codeine  Physical Exam  General:  Well-developed,well-nourished,in no acute distress; alert,appropriate and cooperative throughout examination Head:  Normocephalic and atraumatic without obvious abnormalities. No apparent alopecia or balding. Neck:  No deformities, masses, or tenderness noted. Lungs:  Normal respiratory effort, chest expands symmetrically. Lungs are clear to auscultation, no crackles or wheezes. Heart:  Normal rate and regular rhythm. S1 and S2 normal without gallop, murmur, click, rub or other extra sounds. Cervical Nodes:  No lymphadenopathy noted   Impression & Recommendations:  Problem # 1:  CERVICAL LYMPHADENOPATHY, LEFT (ICD-785.6) Assessment Improved Symptoms resolved.  Likely reactive process.  Defer further imaging at this time.  Plan f/u next week fasting for cpx.   The following medications were removed from the medication list:    Augmentin 500-125 Mg Tabs (Amoxicillin-pot clavulanate) ..... One tablet by mouth two times a day x 7 days  Complete Medication  List: 1)  Zegerid 40-1100 Mg Caps (Omeprazole-sodium bicarbonate) .... Take 1 tablet by mouth once a day 2)  Zetia 10 Mg Tabs (Ezetimibe) .... Take 1 tablet by mouth once a day  Patient Instructions: 1)  Please return fasting for a complete physical.  Current Allergies (reviewed today): ! CODEINE

## 2010-03-21 NOTE — Progress Notes (Signed)
Summary: PT RETURNED YOUR CALL  Phone Note Outgoing Call   Call placed by: Lemont Fillers FNP,  January 03, 2010 8:53 AM Call placed to: Patient Summary of Call: Left message on patient's cell for her to return my call. Initial call taken by: Lemont Fillers FNP,  January 03, 2010 8:53 AM  Follow-up for Phone Call        Pt returned your call and states you may give the informaiton to her husband at home. Nicki Guadalajara Fergerson CMA Duncan Dull)  January 03, 2010 11:55 AM   Additional Follow-up for Phone Call Additional follow up Details #1::        Spoke with Pt's husband.  Notified him that I am going to try to get her set up earlier with Dr. Verlee Monte Brodie's office to see either NP or PA for earlier apt.  Also told him that her blood work looks normal.   Additional Follow-up by: Lemont Fillers FNP,  January 03, 2010 1:05 PM    Additional Follow-up for Phone Call Additional follow up Details #2::    Myriam Jacobson, Could you please see if pt could be scheduled with Amy or Gunnar Fusi at GI for 12/9 or 12/12 for an earlier apt? Thanks Follow-up by: Lemont Fillers FNP,  January 03, 2010 1:06 PM  Additional Follow-up for Phone Call Additional follow up Details #3:: Details for Additional Follow-up Action Taken: Appt   with Dr Juanda Chance rescheduled for  December 12  @ 8:30am  L/M for pt to return call  Additional Follow-up by: Darral Dash,  January 05, 2010 10:43 AM

## 2010-03-21 NOTE — Progress Notes (Signed)
Summary: REFILL REQUEST-- omeprazole-sodium bicarbonate  Phone Note Refill Request Message from:  Fax from Pharmacy on January 25, 2010 8:53 AM  Refills Requested: Medication #1:  OMEPRAZOLE-SODIUM BICARBONATE 40-1100 MG CAPS one tablet by mouth daily   Dosage confirmed as above?Dosage Confirmed   Brand Name Necessary? No   Supply Requested: 1 month   Last Refilled: 12/02/2009 [facility] [address] [place] Valinda Hoar [phone]   Method Requested: Electronic Next Appointment Scheduled: 04-07-10 Peggyann Juba, NP Initial call taken by: Roselle Locus,  January 25, 2010 8:55 AM    Prescriptions: OMEPRAZOLE-SODIUM BICARBONATE 40-1100 MG CAPS (OMEPRAZOLE-SODIUM BICARBONATE) one tablet by mouth daily  #90 x 0   Entered by:   Mervin Kung CMA (AAMA)   Authorized by:   Lemont Fillers FNP   Signed by:   Mervin Kung CMA (AAMA) on 01/25/2010   Method used:   Electronically to        [facility] (retail)       [address]       [place], [place]  [address]       Ph: [phone]       Fax: [phone]   RxID:   [id number]   Appended Document: REFILL REQUEST-- omeprazole-sodium bicarbonate Received fax from pharmacist wanting to verify that rx should be for 1 daily. Notified her that directions are 1 daily and were called to voicemail on 12/02/09 with that direction.

## 2010-03-21 NOTE — Letter (Signed)
Summary: Out of Work  Adult nurse at [employer]. [address]   [place], [place] [address]   Phone: [phone]  Fax: [phone]    November 23, 2009   Employee:  Docia Chuck    To Whom It May Concern:   For Medical reasons, please excuse the above named employee from work for the following dates:  Start:   10/6  End:   10/7  If you need additional information, please feel free to contact our office.         Sincerely,    Lemont Fillers FNP

## 2010-03-21 NOTE — Progress Notes (Signed)
Summary: refill--Lactulose  Phone Note Call from Patient   Caller: Patient Call For: Lemont Fillers FNP Summary of Call: Pt left message requesting refill on Lactulose. Also asks that we have pharmacy change all scripts to  [facility] name as pt is coming to [place] for primary care now. Per pt, she is not going to [place] for any services. Nicki Guadalajara Fergerson CMA Duncan Dull)  November 07, 2009 9:38 AM   Follow-up for Phone Call        Pls call patient and let her know lactulose was sent to pharmacy.  When she is running low on other rx's she should call us and we will send new rx with my name on it- then pharmacy will call us for future refills- not [facility]. Follow-up by: Lemont Fillers FNP,  November 07, 2009 10:10 AM  Additional Follow-up for Phone Call Additional follow up Details #1::        Pt notified. Nicki Guadalajara Fergerson CMA Duncan Dull)  November 07, 2009 10:48 AM     Prescriptions: LACTULOSE 10 GM/15ML SOLN (LACTULOSE) 1 tblsp daily  #350 x 2   Entered and Authorized by:   Lemont Fillers FNP   Signed by:   Lemont Fillers FNP on 11/07/2009   Method used:   Electronically to        [facility]       [address]       [place], [place]  [address]       Ph: [phone]       Fax: [phone]   RxID:   [id number]

## 2010-03-21 NOTE — Letter (Signed)
Summary: Records Dated 03-19-07 thru 06-06-09/[facility]  Records Dated 03-19-07 thru 06-06-09/[facility]   Imported By: Lanelle Bal 07/20/2009 11:41:45  _____________________________________________________________________  External Attachment:    Type:   Image     Comment:   External Document

## 2010-03-21 NOTE — Progress Notes (Signed)
Summary: Omeprazole Refill  Phone Note Refill Request Message from:  Fax from Pharmacy on October 07, 2009 8:03 AM  Refills Requested: Medication #1:  OMEPRAZOLE 40 MG CPDR Take 1 tablet by mouth once a day   Dosage confirmed as above?Dosage Confirmed   Brand Name Necessary? No   Supply Requested: 1 month  Method Requested: Electronic Next Appointment Scheduled: Appt 01/02/10 Sandford Craze Initial call taken by: Glendell Docker CMA,  October 07, 2009 8:03 AM    Prescriptions: OMEPRAZOLE 40 MG CPDR (OMEPRAZOLE) Take 1 tablet by mouth once a day  #30 x 3   Entered and Authorized by:   Lemont Fillers FNP   Signed by:   Lemont Fillers FNP on 10/07/2009   Method used:   Electronically to        [facility]ail[facility]       [address]       [place], [place]  [address]       Ph: [phone]       Fax: [phone]   RxID:   [id number]

## 2010-03-21 NOTE — Assessment & Plan Note (Signed)
Summary: 3 month follow up/mhf--Rm 5   Vital Signs:  Patient profile:   49 year old female Height:      61 inches Weight:      149.50 pounds BMI:     28.35 Temp:     98.3 degrees F oral Pulse rate:   78 / minute Pulse rhythm:   regular Resp:     16 per minute BP sitting:   130 / 86  (right arm)  Vitals Entered By: Mervin Kung CMA Duncan Dull) (January 02, 2010 8:02 AM) CC: 3 month follow up. Pt has been having a burning pain in her stomach x 2 days. Is Patient Diabetic? No   Primary Care Provider:  Lemont Fillers FNP  CC:  3 month follow up. Pt has been having a burning pain in her stomach x 2 days.[facility]  History of Present Illness: Whitney Neal is a 50 year old female who presents today with chief complaint of epigastric pain. She notes that this pain started approximately 2 days ago. Pain is not improved by food. She does note some improvement with Tylenol. She denies vomiting but does have sensation of reflux in the esophagus. She has a previous history other gastric ulcer as well as Barrett esophagus. She is maintained on a proton pump inhibitor. Her previous gastroenterology workup was completed at [facility]. She tells me that she wishes to establish with both our GI.   Vitamin D- talking and also cltrate.    Allergies: 1)  ! Codeine  Past History:  Past Medical History: Last updated: 09/14/2009 OSTEOPENIA BARRETT'S ESOPHAGUS, HX OF  VITAMIN D DEFICIENCY CERVICAL LYMPHADENOPATHY, LEFT  Ulcer--2010 Hypercholesterolemia Urine Incontinence ?PFO  Past Surgical History: Last updated: 06/17/2009 Bladder Mesh surgery?-- 2010, Dr. Noland Fordyce, Colon polypectomy--2010 Hysterectomy--2009, Dr. Ferdinand Cava  Family History: Reviewed history from 09/14/2009 and no changes required. diabetes--sister colon cancer--father PE--mother,deceased HTN--brother,sister, father, mother Hypercholesterolemia--brother,sister,father,mother Mouth cancer--paternal  grandmother Thyroid problem-- 1/61 sister 48 year old daughter-healthy 80-  healthy.   Brother with MI at age 25 Father with MI at age 57  Social History: Reviewed history from 07/04/2009 and no changes required. Married- is guardian for two grandchildren ages 18 and 74. Never Smoked Alcohol use-no Regular exercise-no Works in housekeeping at [employer]  Review of Systems       the patient denies black or bloody stools. She denies hematemesis. She denies vomiting.  Physical Exam  General:  Well-developed,well-nourished,in no acute distress; alert,appropriate and cooperative throughout examination Lungs:  Normal respiratory effort, chest expands symmetrically. Lungs are clear to auscultation, no crackles or wheezes. Heart:  Normal rate and regular rhythm. S1 and S2 normal without gallop, murmur, click, rub or other extra sounds. Abdomen:  Mild epigastric tenderness without guarding.  Abdomen is soft, + bowel sounds.  Non-distended.   Impression & Recommendations:  Problem # 1:  EPIGASTRIC PAIN (ICD-789.06) Assessment New Will check LFT's, and CBC.  we'll also try to add on an amylase and lipase to rule out pancreatitis. Given patient's history suspect possibility of underlying ulcer. Plan referral to [facility] for further evaluation.  Continue Zegerid.   Orders: TLB-Hepatic/Liver Function Pnl ([id number]) Gastroenterology Referral (GI)  Complete Medication List: 1)  Omeprazole-sodium Bicarbonate 40-1100 Mg Caps (Omeprazole-sodium bicarbonate) .... One tablet by mouth daily 2)  Pravastatin Sodium 40 Mg Tabs (Pravastatin sodium) .... One tablet by mouth daily at bedtime 3)  Lactulose 10 Gm/28ml Soln (Lactulose) .[facility].. 1 tblsp daily 4)  Vitamin D3 1000 Unit Tabs (Cholecalciferol) .... 3 tabs by  mouth once daily 5)  Caltrate 600+d 600-400 Mg-unit Tabs (Calcium carbonate-vitamin d) .... One tablet by mouth twice daily 6)  Percocet 5-325 Mg Tabs (Oxycodone-acetaminophen)  .... One tablet by mouth every 6 hours as needed for pain  Other Orders: TLB-CBC Platelet - w/Differential ([id number]-[id number])  Patient Instructions: 1)  Call if you develop vomitting with "coffee grounds", black or bloody stools. 2)  You will be called about your referral to see Dr. Lina Sar.   Orders Added: 1)  TLB-CBC Platelet - w/Differential [[id number]] 2)  TLB-Hepatic/Liver Function Pnl [[id number]] 3)  Gastroenterology Referral [GI] 4)  Est. Patient Level III [81[address]]    Current Allergies (reviewed today): ! CODEINE

## 2010-03-21 NOTE — Letter (Signed)
Summary: Primary Care Consult Scheduled Letter  [place] [facility]  [address]   [place], [place] [address]   Phone: [phone]  Fax: [phone]      07/05/2009 MRN: [id number]  Zilda Levandoski [address] [place], [place]  [address]    Dear Ms. Figiel,      We have scheduled an appointment for you.  At the recommendation of MELISSA O'SULLIVAN, FNP , we have scheduled you a consult with [facility] [place]  on MAY 27,2011 at 3:30PM_.  Their address [address], [place] . The office phone number is [phone].  If this appointment day and time is not convenient for you, please feel free to call the office of the doctor you are being referred to at the number listed above and reschedule the appointment.     It is important for you to keep your scheduled appointments. We are here to make sure you are given good patient care. If you have questions or you have made changes to your appointment, please notify us at  [phone], ask for HELEN.    Thank you,  Darral Dash Patient Care Coordinator [place] [facility]
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
## 2010-03-21 NOTE — Assessment & Plan Note (Signed)
Summary: INFLAMATION IN JAW/  SEEN IN ED  LAST EVENING /HEA   Vital Signs:  Patient profile:   50 year old female Height:      61 inches Weight:      147.44 pounds BMI:     27.96 Temp:     98.0 degrees F oral Pulse rate:   90 / minute Pulse rhythm:   regular Resp:     16 per minute BP sitting:   132 / 90  (right arm) Cuff size:   regular  Vitals Entered By: Mervin Kung CMA (June 17, 2009 2:51 PM) CC: room 6  Pt here for ER follow up for cervical adenitis. Is Patient Diabetic? No   CC:  room 6  Pt here for ER follow up for cervical adenitis.[facility]  History of Present Illness: Whitney Neal is a 50 year old female (new to establish) who presents with c/o left sided cervical lymphadenapathy x 48 hours.  [facility] and was prescibed as needed vicodin and instructed to f/u with her primary MD.  She had a negative monospot as well as a normal CBC last night.  Pt notes that the area is warm and tender.  Denies associated ear pain or sore throat.  Denies fever.    Preventive Screening-Counseling & Management  Alcohol-Tobacco     Alcohol drinks/day: 0     Smoking Status: never  Caffeine-Diet-Exercise     Caffeine use/day: 1 cup     Does Patient Exercise: no  Allergies (verified): 1)  ! Codeine  Past History:  Past Medical History: Ulcer--2010 Hypercholesterolemia Urine Incontinence  Past Surgical History: Bladder Mesh surgery?-- 2010, Dr. Noland Fordyce, Colon polypectomy--2010 Hysterectomy--2009, Dr. Ferdinand Cava  Family History: diabetes--sister colon cancer--father PE--mother,deceased HTN--brother,sister, father, mother Hypercholesterolemia--brother,sister,father,mother Mouth cancer--paternal grandmother Thyroid problem-- 1/2 sister  Social History: Married Never Smoked Alcohol use-no Regular exercise-no Smoking Status:  never Caffeine use/day:  1 cup Does Patient Exercise:  no  Physical Exam  General:  Well-developed,well-nourished,in  no acute distress; alert,appropriate and cooperative throughout examination Head:  Normocephalic and atraumatic without obvious abnormalities. No apparent alopecia or balding. Eyes:  PERRLA Ears:  External ear exam shows no significant lesions or deformities.  Otoscopic examination reveals clear canals, tympanic membranes are intact bilaterally without bulging, retraction, inflammation or discharge. Hearing is grossly normal bilaterally. Mouth:  Oral mucosa and oropharynx without lesions or exudates.  Teeth in good repair. Lungs:  Normal respiratory effort, chest expands symmetrically. Lungs are clear to auscultation, no crackles or wheezes. Heart:  Normal rate and regular rhythm. S1 and S2 normal without gallop, murmur, click, rub or other extra sounds. Cervical Nodes:  + tender enlarged LN  left anterior cervical chain beneath jaw and another enlarged LN at lower portion of cervical chain.     Impression & Recommendations:  Problem # 1:  CERVICAL LYMPHADENOPATHY, LEFT (ICD-785.6) Assessment New Will treat patient empirically with Augmentin.  Plan f/u in 1 week.  If no improvement in 1 week, consider CT neck.  I suspect that this is a reactive process.   Her updated medication list for this problem includes:    Augmentin 500-125 Mg Tabs (Amoxicillin-pot clavulanate) ..... One tablet by mouth two times a day x 7 days  Complete Medication List: 1)  Zegerid 40-1100 Mg Caps (Omeprazole-sodium bicarbonate) .... Take 1 tablet by mouth once a day 2)  Augmentin 500-125 Mg Tabs (Amoxicillin-pot clavulanate) .... One tablet by mouth two times a day x 7  days  Patient Instructions: 1)  Please follow up in 1 week, call if fever over 101, worsening pain or swelling, come fasting to this appointment. Prescriptions: AUGMENTIN 500-125 MG TABS (AMOXICILLIN-POT CLAVULANATE) one tablet by mouth two times a day x 7 days  #14 x 0   Entered and Authorized by:   Lemont Fillers FNP   Signed by:   Lemont Fillers FNP on 06/17/2009   Method used:   Electronically to        [facility] ([facility]ail)       [address]       [place], [place]  [address]       Ph: [phone]       Fax: [phone]   RxID:   [id number]    Preventive Care Screening  Colonoscopy:    Date:  06/14/2009    Results:  normal   Mammogram:    Date:  02/21/2009    Results:  normal   Pap Smear:    Date:  06/20/2006    Results:  normal    Current Allergies (reviewed today): ! CODEINE

## 2010-03-21 NOTE — Miscellaneous (Signed)
  Clinical Lists Changes  Observations: Added new observation of MAMMOGRAM: normal (08/26/2009 10:50)      Preventive Care Screening  Mammogram:    Date:  08/26/2009    Results:  normal

## 2010-03-21 NOTE — Progress Notes (Signed)
Summary: lab result  Phone Note Outgoing Call Call back at [phone]--cell   Summary of Call: Please call patient and let her know that her vitamin D level is now normal.  She should finish her prescription of the once weekly vitamin D, then start 3000 units once daily as below.  Initial call taken by: Lemont Fillers FNP,  November 25, 2009 8:53 AM  Follow-up for Phone Call        Left message on pt's cell to return my call. Nicki Guadalajara Fergerson CMA Duncan Dull)  November 25, 2009 9:25 AM   Additional Follow-up for Phone Call Additional follow up Details #1::        Pt returned my call and was notified per [facility] instructions. Pt voices understanding. Nicki Guadalajara Fergerson CMA (AAMA)  November 25, 2009 10:04 AM     New/Updated Medications: VITAMIN D3 1000 UNIT TABS (CHOLECALCIFEROL) 3 tabs by mouth once daily

## 2010-03-21 NOTE — Assessment & Plan Note (Signed)
Summary: pain in neck & shoulder hurts when turns head/dt--Rm 5   Vital Signs:  Patient profile:   50 year old female Height:      61 inches Weight:      0.25 pounds BMI:     0.05 Temp:     98.3 degrees F 97.8 Pulse rate:   84 / minute Pulse rhythm:   regular Resp:     18 per minute BP sitting:   128 / 84  (right arm) Cuff size:   regular  Vitals Entered By: Mervin Kung CMA Duncan Dull) (November 23, 2009 3:01 PM) CC: Rm 5   Pt states she woke up with pain on the left side of her neck radiating to her shoulder. Pain Assessment Patient in pain? yes     Location: neck Intensity: 8 Type: sharp Onset of pain  This morning. Feels sharp and heavy. Comments Pt needs note for work today. Whitney Neal CMA Duncan Dull)  November 23, 2009 3:07 PM    Primary Care Provider:  Lemont Fillers FNP  CC:  Rm 5   Pt states she woke up with pain on the left side of her neck radiating to her shoulder.Marland Kitchen  History of Present Illness: Ms Kalman is a 50 year old female who presents today with complaint of left sided neck pain/left shoulderpain.  Woke up with this this AM, took tylenol without relief.  Pain is worse when she moves her head to the left.  She has been using 5 pound hand weights regularly.  Denies known injury.    Allergies: 1)  ! Codeine  Physical Exam  General:  Well-developed,well-nourished,in no acute distress; alert,appropriate and cooperative throughout examination Head:  Normocephalic and atraumatic without obvious abnormalities. No apparent alopecia or balding. Neck:  No deformities, masses, or tenderness noted. Msk:  Full ROM of neck, increased pain with extension.  + tenderness to palpation of left side of neck. Neurologic:  Bilateral upper extremity strength is 5/5.  Strong equal hand grasps.  2+ bilateral UE reflexes   Impression & Recommendations:  Problem # 1:  CERVICAL STRAIN (ICD-847.0) Assessment New Given patient's history of ulcers, will avoid NSAIDS.  Plan  short term rx with percocet.   Her updated medication list for this problem includes:    Percocet 5-325 Mg Tabs (Oxycodone-acetaminophen) ..... One tablet by mouth every 6 hours as needed for pain  Problem # 2:  VITAMIN D DEFICIENCY (ICD-268.9) Assessment: New Patient has been on the once weekly regimen of Vitamin D 50,000.  Will plan to check f/u level. Orders: T-Assay of Vitamin D [phone])  Complete Medication List: 1)  Omeprazole-sodium Bicarbonate 40-1100 Mg Caps (Omeprazole-sodium bicarbonate) .... One tablet by mouth daily 2)  Pravastatin Sodium 40 Mg Tabs (Pravastatin sodium) .... One tablet by mouth daily at bedtime 3)  Lactulose 10 Gm/54ml Soln (Lactulose) .Marland Kitchen.. 1 tblsp daily 4)  Vitamin D3 1000 Unit Tabs (Cholecalciferol) .... 3 tabs by mouth once daily 5)  Caltrate 600+d 600-400 Mg-unit Tabs (Calcium carbonate-vitamin d) .... One tablet by mouth twice daily 6)  Percocet 5-325 Mg Tabs (Oxycodone-acetaminophen) .... One tablet by mouth every 6 hours as needed for pain  Patient Instructions: 1)  Call if symptoms worsen or do not improve. 2)  Follow up in 4 months. Prescriptions: PERCOCET 5-325 MG TABS (OXYCODONE-ACETAMINOPHEN) one tablet by mouth every 6 hours as needed for pain  #20 x 0   Entered and Authorized by:   Lemont Fillers FNP  Signed by:   Lemont Fillers FNP on 11/23/2009   Method used:   Print then Give to Patient   RxID:   [id number]   Current Allergies (reviewed today): ! CODEINE

## 2010-03-21 NOTE — Miscellaneous (Signed)
  Clinical Lists Changes  Problems: Added new problem of BARRETT'S ESOPHAGUS, HX OF (ICD-V12.79) - per EGD 07/23/08 Added new problem of GASTRIC ULCER, HX OF (ICD-V12.71) - per EGD 07/23/08 Added new problem of OSTEOPENIA (ICD-733.90) - 03/28/07 T score -1.49 L spine, -1.84 hip

## 2010-03-21 NOTE — Letter (Signed)
Summary: New Patient Letter   at [place]  [address] [place], [place] [address]   Phone: [phone]  Fax: [phone]       06/06/2009 MRN: [id number]  Whitney Neal [address] [place], [place]  [address]  Dear Whitney Neal,   Welcome to [facility] and thank you for choosing [place] as your Primary Care Providers. Enclosed you will find information about our practice that we hope you find helpful. We have also enclosed forms to be filled out prior to your visit. This will provide [place] with the necessary information and facilitate your being seen in a timely manner. If you have any questions, please call us at:  [phone]        and we will be happy to assist you. We look forward to seeing you at your scheduled appointment time.  Appointment   MAY [phone] @ 12:40PM            with Dr.  Drue Novel               Sincerely,  Primary Health Care Team  Please arrive 15 minutes early for your first appointment and bring your insurance card. Co-pay is required at the time of your visit.  *****Please call the office if you are not able to keep this appointment. There is a charge of $50.00 if any appointment is not cancelled or rescheduled within 24 hours.

## 2010-03-21 NOTE — Progress Notes (Signed)
Summary: refill--generic zegerid  Phone Note Refill Request Call back at [phone] Message from:  Fax from [facility] on 10/11/09  Refills Requested: Medication #1:  Omeprazole/Sodium 40-1100mg . Take 1 capsule twice daily.   Supply Requested: 3 months   Last Refilled: 07/12/2009 Received fax from pharmacy that pt is requesting 90 day supply of generic zegerid.  Left message on pt's cell # to call and clarify why she is wanting to change to this med. Looks like pt was taking this from a past prescriber.  Nicki Guadalajara Fergerson CMA Duncan Dull)  October 12, 2009 8:28 AM   Next Appointment Scheduled: 12/2009-- Sandford Craze, NP  Follow-up for Phone Call        Pt returned my call and states that Generic Zegerid works better for her reflux.  On Omeprazole she is still having some reflux symptoms. Can we call in Generic Zegerid 1 twice a day as she was taking it in the past? Mervin Kung CMA Duncan Dull)  October 12, 2009 9:54 AM   Additional Follow-up for Phone Call Additional follow up Details #1::        Rx sent- she should only take once a day for long term.  Pls notify pt. Additional Follow-up by: Lemont Fillers FNP,  October 12, 2009 11:55 AM    Additional Follow-up for Phone Call Additional follow up Details #2::    Left message with pt's husband to have pt return my call. Nicki Guadalajara Fergerson CMA Duncan Dull)  October 12, 2009 1:34 PM   Received message from pt stating to call her on her cell.  Called and left message on cell voicemail re: new directions for Generic zegerid and to call if she has any questions. Nicki Guadalajara Fergerson CMA (AAMA)  October 12, 2009 1:42 PM   New/Updated Medications: OMEPRAZOLE-SODIUM BICARBONATE 40-1100 MG CAPS (OMEPRAZOLE-SODIUM BICARBONATE) one tablet by mouth daily Prescriptions: OMEPRAZOLE-SODIUM BICARBONATE 40-1100 MG CAPS (OMEPRAZOLE-SODIUM BICARBONATE) one tablet by mouth daily  #90 x 0   Entered and Authorized by:   Lemont Fillers FNP   Signed  by:   Lemont Fillers FNP on 10/12/2009   Method used:   Electronically to        [facility] (retail)       [address]       [place], [place]  [address]       Ph: [phone]       Fax: [phone]   RxID:   [id number]

## 2010-03-23 NOTE — Assessment & Plan Note (Signed)
Summary: EPIGASTRIC PAIN/YF   History of Present Illness Visit Type: Initial Consult Primary GI MD: Lina Sar MD Primary Provider: Lemont Fillers FNP Requesting Provider: Lemont Fillers FNP Chief Complaint: Epigastric abd pain, GERD, bloating, and constipation  History of Present Illness:   This 50 year old Hispanic female from the [place] with a history of gastric ulcer on upper endoscopy in March 2010 but not on a subsequent endoscopy in June 2010. She had Barrett's esophagus at the same time which showed metaplasia and goblet cells at the g-e junction. She was on ibuprofen at that time. She still has intermittent epigastric pain which bothers her episodically. Her last episode was 2 weeks ago and it is still there. She is on omeprazole 20 mg daily. Prilosec twice daily helps  but she was told not to take it twice a day. A colonoscopy in March 2010 showed a hyperplastic polyp and suboptimal prep. There is a positive family of colon cancer in her father in his 68s. There is question as to whether her mother had ulcerative colitis. Patient has chronic constipation. A CT scan of the abdomen in February 2010 showed her to be status post hysterectomy but showed an otherwise normal abdomen. Patient had recent normal liver function tests, amylase and lipase.   GI Review of Systems    Reports abdominal pain, acid reflux, bloating, and  heartburn.     Location of  Abdominal pain: epigastric area.    Denies belching, chest pain, dysphagia with liquids, dysphagia with solids, loss of appetite, nausea, vomiting, vomiting blood, weight loss, and  weight gain.      Reports constipation.     Denies anal fissure, black tarry stools, change in bowel habit, diarrhea, diverticulosis, fecal incontinence, heme positive stool, hemorrhoids, irritable bowel syndrome, jaundice, light color stool, liver problems, rectal bleeding, and  rectal pain.    Current Medications (verified): 1)   Omeprazole-Sodium Bicarbonate 40-1100 Mg Caps (Omeprazole-Sodium Bicarbonate) .... One Tablet By Mouth Daily 2)  Pravastatin Sodium 40 Mg Tabs (Pravastatin Sodium) .... One Tablet By Mouth Daily At Bedtime 3)  Lactulose 10 Gm/49ml Soln (Lactulose) .Marland Kitchen.. 1 Tblsp Daily 4)  Vitamin D3 1000 Unit Tabs (Cholecalciferol) .... 3 Tabs By Mouth Once Daily 5)  Caltrate 600+d 600-400 Mg-Unit Tabs (Calcium Carbonate-Vitamin D) .... One Tablet By Mouth Twice Daily 6)  Percocet 5-325 Mg Tabs (Oxycodone-Acetaminophen) .... One Tablet By Mouth Every 6 Hours As Needed For Pain 7)  Tylenol 325 Mg Tabs (Acetaminophen) .... As Needed For Neck Pain  Allergies (verified): 1)  ! Codeine  Past History:  Past Medical History: CERVICAL LYMPHADENOPATHY, LEFT  Ulcer--2010 Hypercholesterolemia Urine Incontinence ?PFO Family Hx of COLON CANCER (ICD-153.9) GERD (ICD-530.81) HYPERTENSION (ICD-401.9) CONSTIPATION (ICD-564.00) COLONIC POLYPS, HYPERPLASTIC, HX OF (ICD-V12.72) EPIGASTRIC PAIN (ICD-789.06) CERVICAL STRAIN (ICD-847.0) CHEST PAIN, ATYPICAL, HX OF (ICD-V15.89) OSTEOPENIA (ICD-733.90) GASTRIC ULCER, HX OF (ICD-V12.71) BARRETT'S ESOPHAGUS, HX OF (ICD-V12.79) VITAMIN D DEFICIENCY (ICD-268.9) HEALTH SCREENING (ICD-V70.0)  Past Surgical History: Reviewed history from 06/17/2009 and no changes required. Bladder Mesh surgery?-- 2010, Dr. Noland Fordyce, Colon polypectomy--2010 Hysterectomy--2009, Dr. Ferdinand Cava  Family History: Reviewed history from 09/14/2009 and no changes required. diabetes--sister colon cancer--father PE--mother,deceased HTN--brother,sister, father, mother Hypercholesterolemia--brother,sister,father,mother Mouth cancer--paternal grandmother Thyroid problem-- 1/43 sister 66 year old daughter-healthy 51-  healthy.   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 Never Smoked Alcohol use-no Regular  exercise-no Daily Caffeine Use: one daily  Illicit Drug Use - no Drug Use:  no  Review of Systems       The patient complains of arthritis/joint pain and back pain.  The patient denies allergy/sinus, anemia, anxiety-new, blood in urine, breast changes/lumps, change in vision, confusion, cough, coughing up blood, depression-new, fainting, fatigue, fever, headaches-new, hearing problems, heart murmur, heart rhythm changes, itching, menstrual pain, muscle pains/cramps, night sweats, nosebleeds, pregnancy symptoms, shortness of breath, skin rash, sleeping problems, sore throat, swelling of feet/legs, swollen lymph glands, thirst - excessive , urination - excessive , urination changes/pain, urine leakage, vision changes, and voice change.         Pertinent positive and negative review of systems were noted in the above HPI. All other ROS was otherwise negative.   Vital Signs:  Patient profile:   50 year old female Height:      61 inches Weight:      150 pounds BMI:     28.44 BSA:     1.67 Pulse rate:   80 / minute Pulse rhythm:   regular BP sitting:   128 / 74  (left arm) Cuff size:   regular  Vitals Entered By: Ok Anis CMA (January 30, 2010 8:44 AM)  Physical Exam  General:  Well developed, well nourished, no acute distress. Eyes:  PERRLA, no icterus. Mouth:  No deformity or lesions, dentition normal. Neck:  Supple; no masses or thyromegaly. Lungs:  Clear throughout to auscultation. Heart:  Regular rate and rhythm; no murmurs, rubs,  or bruits. Abdomen:  soft abdomen with normal active bowel sounds. No distention. No tenderness. Liver edge at costal margin. No scars. Rectal:  soft Hemoccult negative stool. Extremities:  No clubbing, cyanosis, edema or deformities noted. Skin:  Intact without significant lesions or rashes. Psych:  Alert and cooperative. Normal mood and affect.   Impression & Recommendations:  Problem # 1:  Family Hx of COLON CANCER (ICD-153.9) Patient will  be due for a colonoscopy in March 2015.  Problem # 2:  GERD (ICD-530.81) Patient had Barrett's esophagus on her last endoscopy in March 2010. She is due for a repeat upper endoscopy which will be scheduled this week. We will change her from omeprazole to Nexium 40 mg daily. She is to continue antireflux measures.  Other Orders: EGD (EGD)  Patient Instructions: 1)  You have been scheduled for an upper endoscopy for followup of Nexium 40 mg daily dispense  #90. 2)  Follow Antireflux measures. 3)  Recall colonoscopy March 2015. 4)  Milk of magnesia p.r.n. constipation. 5)  Copy sent to : Sandford Craze, FNP 6)  The medication list was reviewed and reconciled.  All changed / newly prescribed medications were explained.  A complete medication list was provided to the patient / caregiver. Prescriptions: NEXIUM 40 MG CPDR (ESOMEPRAZOLE MAGNESIUM) Take 1 tablet by mouth once daily  #90 x 0   Entered by:   Lamona Curl CMA (AAMA)   Authorized by:   Hart Carwin MD   Signed by:   Lamona Curl CMA (AAMA) on 01/30/2010   Method used:   Electronically to        [facility]* (retail)       [address]       [address]/mailing       [place], [place]  [address]       Ph: [phone]       Fax: [phone]   RxID:   [id number]

## 2010-03-23 NOTE — Procedures (Signed)
Summary: Upper Endoscopy  Patient: [name] Note: All result statuses are Final unless otherwise noted.  Tests: (1) Upper Endoscopy (EGD)   EGD Upper Endoscopy       DONE     [facility]     [address]     [place], [place]  [address]           ENDOSCOPY PROCEDURE REPORT           PATIENT:  Whitney Neal, Whitney Neal  MR#:  [id number]     BIRTHDATE:  1960/11/07, 49 yrs. old  GENDER:  female           ENDOSCOPIST:  Hedwig Morton. Juanda Chance, MD     Referred by:  Sandford Craze, N.P.           PROCEDURE DATE:  02/03/2010     PROCEDURE:  EGD with biopsy, [id number]     ASA CLASS:  Class I     INDICATIONS:  h/o Barrett's Esophagus Barret's esophagus on EGD     04/2008, also gastric ulcer.     Repeat EGD 07/2008 showed no ulcer     recurrent pain           MEDICATIONS:   Versed 5 mg, Fentanyl 50 mcg     TOPICAL ANESTHETIC:  Exactacain Spray           DESCRIPTION OF PROCEDURE:   After the risks benefits and     alternatives of the procedure were thoroughly explained, informed     consent was obtained.  The LB [id number] endoscope was     introduced through the mouth and advanced to the second portion of     the duodenum, without limitations.  The instrument was slowly     withdrawn as the mucosa was fully examined.     <<PROCEDUREIMAGES>>           The upper, middle, and distal third of the esophagus were     carefully inspected and no abnormalities were noted. The z-line     was well seen at the GEJ. The endoscope was pushed into the fundus     which was normal including a retroflexed view. The antrum,gastric     body, first and second part of the duodenum were unremarkable.     normal z-line with slight increase in fibrous tissue     no evidence of gastric ulcer With standard forceps, a biopsy was     obtained and sent to pathology (see image1, image2, image3, and     image4). r/o Barrett's    Retroflexed views revealed no     abnormalities.    The scope was then withdrawn  from the patient     and the procedure completed.           COMPLICATIONS:  None           ENDOSCOPIC IMPRESSION:     1) Normal EGD     s/p biopsies from z-line     no evidence of gastric ulcer     RECOMMENDATIONS:     1) Await biopsy results     cont Nexiem 40 mg po qd           REPEAT EXAM:  In 2 year(s) for.           ______________________________     Hedwig Morton. Juanda Chance, MD           CC:  n.     eSIGNED:   Hedwig Morton. Vilda Zollner at 02/03/2010 01:28 PM           Docia Chuck, [id number]  Note: An exclamation mark (!) indicates a result that was not dispersed into the flowsheet. Document Creation Date: 02/03/2010 1:28 PM _______________________________________________________________________  (1) Order result status: Final Collection or observation date-time: 02/03/2010 13:20 Requested date-time:  Receipt date-time:  Reported date-time:  Referring Physician:   Ordering Physician: Lina Sar [id number]) Specimen Source:  Source: Launa Grill Order Number: [id number] Lab site:   Appended Document: Upper Endoscopy recall 2 yrs     Procedures Next Due Date:    EGD: 01/2012

## 2010-03-23 NOTE — Letter (Signed)
Summary: EGD Instructions  [facility]  [address] [place], [place] [address]   Phone: [phone]  Fax: [phone]       Whitney Neal    12/25/1960    MRN: [id number]       Procedure Day /Date: Friday 02/03/10     Arrival Time: 12:15 pm     Procedure Time: 1:15 pm     Location of Procedure:                    _x  _ [facility] (4th Floor)  PREPARATION FOR ENDOSCOPY   On 02/03/10 THE DAY OF THE PROCEDURE:  1.   No solid foods, milk or milk products are allowed after midnight the night before your procedure.  2.   Do not drink anything colored red or purple.  Avoid juices with pulp.  No orange juice.  3.  You may drink clear liquids until 11:15 am, which is 2 hours before your procedure.                                                                                                CLEAR LIQUIDS INCLUDE: Water Jello Ice Popsicles Tea (sugar ok, no milk/cream) Powdered fruit flavored drinks Coffee (sugar ok, no milk/cream) Gatorade Juice: apple, white grape, white cranberry  Lemonade Clear bullion, consomm, broth Carbonated beverages (any kind) Strained chicken noodle soup Hard Candy   MEDICATION INSTRUCTIONS  Unless otherwise instructed, you should take regular prescription medications with a small sip of water as early as possible the morning of your procedure.                   OTHER INSTRUCTIONS  You will need a responsible adult at least 52 years of age to accompany you and drive you home.   This person must remain in the waiting room during your procedure.  Wear loose fitting clothing that is easily removed.  Leave jewelry and other valuables at home.  However, you may wish to bring a book to read or an iPod/MP3 player to listen to music as you wait for your procedure to start.  Remove all body piercing jewelry and leave at home.  Total time from sign-in until discharge is approximately 2-3 hours.  You  should go home directly after your procedure and rest.  You can resume normal activities the day after your procedure.  The day of your procedure you should not:   Drive   Make legal decisions   Operate machinery   Drink alcohol   Return to work  You will receive specific instructions about eating, activities and medications before you leave.    The above instructions have been reviewed and explained to me by   Lamona Curl CMA Duncan Dull)  January 30, 2010 9:25 AM     I fully understand and can verbalize these instructions _____________________________ Date _________

## 2010-03-23 NOTE — Letter (Signed)
Summary: Patient Notice-Barrett's [facility]  [address] [place], [place] [address]   Phone: [phone]  Fax: [phone]        February 07, 2010 MRN: [id number]    Whitney Neal [address] [place], [place]  [address]    Dear Ms. Hentges,  I am pleased to inform you that the biopsies taken during your recent endoscopic examination did not show any evidence of cancer upon pathologic examination.  However, your biopsies indicate you have a condition known as Barrett's esophagus. While not cancer, it is pre-cancerous (can progress to cancer) and needs to be monitored with repeat endoscopic examination and biopsies.  Fortunately, it is quite rare that this develops into cancer, but careful monitoring of the condition along with taking your medication as prescribed is important in reducing the risk of developing cancer.  It is my recommendation that you have a repeat upper gastrointestinal endoscopic examination in 2_ years.  Additional information/recommendations:  __Please call [phone] to schedule a return visit to further      evaluate your condition.  _x_Continue with treatment plan as outlined the day of your exam.  Please call us if you have or develop heartburn, reflux symptoms, any swallowing problems, or if you have questions about your condition that have not been fully answered at this time.  Sincerely,  Hart Carwin MD  This letter has been electronically signed by your physician.  Appended Document: Patient Notice-Barrett's Esopghagus letter mailed

## 2010-04-07 ENCOUNTER — Encounter: Payer: Self-pay | Admitting: Family

## 2010-04-07 ENCOUNTER — Ambulatory Visit (INDEPENDENT_AMBULATORY_CARE_PROVIDER_SITE_OTHER): Payer: Commercial Managed Care - PPO | Admitting: Family

## 2010-04-07 DIAGNOSIS — M899 Disorder of bone, unspecified: Secondary | ICD-10-CM

## 2010-04-07 DIAGNOSIS — K219 Gastro-esophageal reflux disease without esophagitis: Secondary | ICD-10-CM

## 2010-04-07 DIAGNOSIS — M949 Disorder of cartilage, unspecified: Secondary | ICD-10-CM

## 2010-04-07 DIAGNOSIS — E785 Hyperlipidemia, unspecified: Secondary | ICD-10-CM

## 2010-04-12 NOTE — Assessment & Plan Note (Signed)
Summary: 3 month fu/mhf--rm 5   Vital Signs:  Patient profile:   50 year old female Height:      61 inches Weight:      150.50 pounds BMI:     28.54 Temp:     98.1 degrees F oral Pulse rate:   84 / minute Pulse rhythm:   regular Resp:     12 per minute BP sitting:   122 / 80  (right arm) Cuff size:   regular  Vitals Entered By: Mervin Kung CMA Duncan Dull) (April 07, 2010 8:15 AM) CC: Pt here for 3 month follow up. Is Patient Diabetic? No Comments Pt has completed Lactulose, Percocet and Tylenol. Pt no longer takes Calcium. Nicki Guadalajara Fergerson CMA (AAMA)  April 07, 2010 8:21 AM    Primary Care Provider:  Lemont Fillers FNP  CC:  Pt here for 3 month follow up.Marland Kitchen  History of Present Illness: Ms. Lindseth is a 50 year old female who presents today for follow.  1) GERD/Barrett's Esophagus-  taking nexium with good relief.  Had neg EGD recently with [facility].  Denies chest pain.  2) Osteopenia- Stopped calcium due to constipation  3)Hyperlipidemia- patient continues pravastatin without side effects.  Allergies: 1)  ! Codeine  Past History:  Past Medical History: Last updated: 01/30/2010 CERVICAL LYMPHADENOPATHY, LEFT  Ulcer--2010 Hypercholesterolemia Urine Incontinence ?PFO Family Hx of COLON CANCER (ICD-153.9) GERD (ICD-530.81) HYPERTENSION (ICD-401.9) CONSTIPATION (ICD-564.00) COLONIC POLYPS, HYPERPLASTIC, HX OF (ICD-V12.72) EPIGASTRIC PAIN (ICD-789.06) CERVICAL STRAIN (ICD-847.0) CHEST PAIN, ATYPICAL, HX OF (ICD-V15.89) OSTEOPENIA (ICD-733.90) GASTRIC ULCER, HX OF (ICD-V12.71) BARRETT'S ESOPHAGUS, HX OF (ICD-V12.79) VITAMIN D DEFICIENCY (ICD-268.9) HEALTH SCREENING (ICD-V70.0)  Past Surgical History: Last updated: 06/17/2009 Bladder Mesh surgery?-- 2010, Dr. Noland Fordyce, Colon polypectomy--2010 Hysterectomy--2009, Dr. Ferdinand Cava  Review of Systems       see HPI  Physical Exam  General:  Well-developed,well-nourished,in no acute distress;  alert,appropriate and cooperative throughout examination Lungs:  Normal respiratory effort, chest expands symmetrically. Lungs are clear to auscultation, no crackles or wheezes. Heart:  Normal rate and regular rhythm. S1 and S2 normal without gallop, murmur, click, rub or other extra sounds. Abdomen:  Bowel sounds positive,abdomen soft and non-tender without masses, organomegaly or hernias noted. Psych:  Cognition and judgment appear intact. Alert and cooperative with normal attention span and concentration. No apparent delusions, illusions, hallucinations   Impression & Recommendations:  Problem # 1:  GERD (ICD-530.81) Assessment Improved Symptoms of epigastric pain are resolved.  Recommended that she continue PPI daily for prevention given her hx of Barretts esophagus.  Her updated medication list for this problem includes:    Nexium 40 Mg Cpdr (Esomeprazole magnesium) .Marland Kitchen... Take 1 tablet by mouth once daily  Problem # 2:  OSTEOPENIA (ICD-733.90) Assessment: Unchanged Recommended that she resume calcium supplement, add stool softner as needed   Problem # 3:  HYPERLIPIDEMIA (ICD-272.4) Assessment: Comment Only Continue pravastatin.  Repeat at her upcoming physical fasting.  Her updated medication list for this problem includes:    Pravastatin Sodium 40 Mg Tabs (Pravastatin sodium) ..... One tablet by mouth daily at bedtime  Labs Reviewed: SGOT: 16 (01/02/2010)   SGPT: 10 (01/02/2010)   HDL:53 (09/26/2009), 58 (07/04/2009)  LDL:127 (09/26/2009), 71 (32/35/5732)  Chol:196 (09/26/2009), 149 (07/04/2009)  Trig:79 (09/26/2009), 101 (07/04/2009)  Complete Medication List: 1)  Nexium 40 Mg Cpdr (Esomeprazole magnesium) .... Take 1 tablet by mouth once daily 2)  Pravastatin Sodium 40 Mg Tabs (Pravastatin sodium) .... One tablet by mouth daily at bedtime 3)  Vitamin D3 1000 Unit Tabs (Cholecalciferol) .... 3 tabs by mouth once daily 4)  Caltrate 600+d 600-400 Mg-unit Tabs (Calcium  carbonate-vitamin d) .... One tablet by mouth twice daily  Patient Instructions: 1)  Restart the calcium vitamin D.   2)  Take nexium every day. 3)  Follow up in early June for a complete physical. Come fasting to this appointment.  Prescriptions: PRAVASTATIN SODIUM 40 MG TABS (PRAVASTATIN SODIUM) one tablet by mouth daily at bedtime  #90 x 1   Entered and Authorized by:   Lemont Fillers FNP   Signed by:   Lemont Fillers FNP on 04/07/2010   Method used:   Electronically to        [facility]* (retail)       [address]       [address]/mail[address]       [place], [place]  [address]       Ph: [phone]       Fax: [phone]   RxID:   [id number] NEXIUM 40 MG CPDR (ESOMEPRAZOLE MAGNESIUM) Take 1 tablet by mouth once daily  #90 x 1   Entered and Authorized by:   Lemont Fillers FNP   Signed by:   Lemont Fillers FNP on 04/07/2010   Method used:   Electronically to        [facility]* (retail)       [address]       [address]mail[address]       [place], [place]  [address]       Ph: [phone]       Fax: [phone]   RxID:   [id number]    Orders Added: 1)  Est. Patient Level III [22025]    Current Allergies (reviewed today): ! CODEINE

## 2010-05-07 LAB — URINE MICROSCOPIC-ADD ON

## 2010-05-07 LAB — URINALYSIS, ROUTINE W REFLEX MICROSCOPIC
Bilirubin Urine: NEGATIVE
Glucose, UA: NEGATIVE mg/dL
Ketones, ur: NEGATIVE mg/dL
Nitrite: NEGATIVE
Protein, ur: NEGATIVE mg/dL
Specific Gravity, Urine: 1.011 (ref 1.005–1.030)
Urobilinogen, UA: 0.2 mg/dL (ref 0.0–1.0)
pH: 7 (ref 5.0–8.0)

## 2010-05-07 LAB — URINE CULTURE: Colony Count: 5000

## 2010-05-09 LAB — DIFFERENTIAL
Basophils Absolute: 0 10*3/uL (ref 0.0–0.1)
Basophils Relative: 1 % (ref 0–1)
Eosinophils Absolute: 0.1 10*3/uL (ref 0.0–0.7)
Eosinophils Relative: 1 % (ref 0–5)
Lymphocytes Relative: 29 % (ref 12–46)
Lymphs Abs: 1.8 10*3/uL (ref 0.7–4.0)
Monocytes Absolute: 0.5 10*3/uL (ref 0.1–1.0)
Monocytes Relative: 8 % (ref 3–12)
Neutro Abs: 3.9 10*3/uL (ref 1.7–7.7)
Neutrophils Relative %: 61 % (ref 43–77)

## 2010-05-09 LAB — CBC
HCT: 40 % (ref 36.0–46.0)
Hemoglobin: 13.3 g/dL (ref 12.0–15.0)
MCHC: 33.4 g/dL (ref 30.0–36.0)
MCV: 85.7 fL (ref 78.0–100.0)
Platelets: 281 10*3/uL (ref 150–400)
RBC: 4.67 MIL/uL (ref 3.87–5.11)
RDW: 12.7 % (ref 11.5–15.5)
WBC: 6.3 10*3/uL (ref 4.0–10.5)

## 2010-05-09 LAB — MONONUCLEOSIS SCREEN: Mono Screen: NEGATIVE

## 2010-08-23 ENCOUNTER — Inpatient Hospital Stay (HOSPITAL_COMMUNITY)
Admission: AD | Admit: 2010-08-23 | Discharge: 2010-08-24 | DRG: 392 | Disposition: A | Discharge status: Home / Self Care | Payer: 59 | Source: Other Acute Inpatient Hospital | Attending: Internal Medicine | Admitting: Internal Medicine

## 2010-08-23 ENCOUNTER — Emergency Department (HOSPITAL_BASED_OUTPATIENT_CLINIC_OR_DEPARTMENT_OTHER)
Admission: EM | Admit: 2010-08-23 | Discharge: 2010-08-23 | Disposition: A | Discharge status: Nursing Home (Includes ICF) | Payer: 59 | Source: Home / Self Care | Attending: Emergency Medicine | Admitting: Emergency Medicine

## 2010-08-23 ENCOUNTER — Emergency Department (INDEPENDENT_AMBULATORY_CARE_PROVIDER_SITE_OTHER): Payer: 59

## 2010-08-23 DIAGNOSIS — Z8249 Family history of ischemic heart disease and other diseases of the circulatory system: Secondary | ICD-10-CM

## 2010-08-23 DIAGNOSIS — R0789 Other chest pain: Secondary | ICD-10-CM

## 2010-08-23 DIAGNOSIS — I10 Essential (primary) hypertension: Secondary | ICD-10-CM | POA: Diagnosis present

## 2010-08-23 DIAGNOSIS — R1013 Epigastric pain: Secondary | ICD-10-CM

## 2010-08-23 DIAGNOSIS — Z79899 Other long term (current) drug therapy: Secondary | ICD-10-CM

## 2010-08-23 DIAGNOSIS — K279 Peptic ulcer, site unspecified, unspecified as acute or chronic, without hemorrhage or perforation: Secondary | ICD-10-CM | POA: Diagnosis present

## 2010-08-23 DIAGNOSIS — N898 Other specified noninflammatory disorders of vagina: Secondary | ICD-10-CM | POA: Diagnosis present

## 2010-08-23 DIAGNOSIS — Z823 Family history of stroke: Secondary | ICD-10-CM

## 2010-08-23 DIAGNOSIS — E785 Hyperlipidemia, unspecified: Secondary | ICD-10-CM | POA: Diagnosis present

## 2010-08-23 DIAGNOSIS — K219 Gastro-esophageal reflux disease without esophagitis: Principal | ICD-10-CM | POA: Diagnosis present

## 2010-08-23 DIAGNOSIS — R55 Syncope and collapse: Secondary | ICD-10-CM | POA: Diagnosis present

## 2010-08-23 DIAGNOSIS — R7309 Other abnormal glucose: Secondary | ICD-10-CM | POA: Diagnosis present

## 2010-08-23 LAB — COMPREHENSIVE METABOLIC PANEL
ALT: 14 U/L (ref 0–35)
AST: 20 U/L (ref 0–37)
Albumin: 4.3 g/dL (ref 3.5–5.2)
Alkaline Phosphatase: 100 U/L (ref 39–117)
BUN: 17 mg/dL (ref 6–23)
CO2: 29 mEq/L (ref 19–32)
Calcium: 10.1 mg/dL (ref 8.4–10.5)
Chloride: 102 mEq/L (ref 96–112)
Creatinine, Ser: 0.7 mg/dL (ref 0.50–1.10)
GFR calc Af Amer: >60 mL/min (ref >60)
GFR calc non Af Amer: >60 mL/min (ref >60)
Glucose, Bld: 106 mg/dL — ABNORMAL HIGH (ref 70–99)
Potassium: 3.9 mEq/L (ref 3.5–5.1)
Sodium: 140 mEq/L (ref 135–145)
Total Bilirubin: 0.3 mg/dL (ref 0.3–1.2)
Total Protein: 8 g/dL (ref 6.0–8.3)

## 2010-08-23 LAB — DIFFERENTIAL
Basophils Absolute: 0 10*3/uL (ref 0.0–0.1)
Basophils Relative: 0 % (ref 0–1)
Eosinophils Absolute: 0 10*3/uL (ref 0.0–0.7)
Eosinophils Relative: 0 % (ref 0–5)
Lymphocytes Relative: 38 % (ref 12–46)
Lymphs Abs: 2.3 10*3/uL (ref 0.7–4.0)
Monocytes Absolute: 0.5 10*3/uL (ref 0.1–1.0)
Monocytes Relative: 8 % (ref 3–12)
Neutro Abs: 3.3 10*3/uL (ref 1.7–7.7)
Neutrophils Relative %: 53 % (ref 43–77)

## 2010-08-23 LAB — CK TOTAL AND CKMB (NOT AT ARMC)
CK, MB: 2.9 ng/mL (ref 0.3–4.0)
Relative Index: 1.7 (ref 0.0–2.5)
Total CK: 172 U/L (ref 7–177)

## 2010-08-23 LAB — CBC
HCT: 39.9 % (ref 36.0–46.0)
Hemoglobin: 13.7 g/dL (ref 12.0–15.0)
MCH: 28.4 pg (ref 26.0–34.0)
MCHC: 34.3 g/dL (ref 30.0–36.0)
MCV: 82.8 fL (ref 78.0–100.0)
Platelets: 303 10*3/uL (ref 150–400)
RBC: 4.82 MIL/uL (ref 3.87–5.11)
RDW: 13.7 % (ref 11.5–15.5)
WBC: 6.1 10*3/uL (ref 4.0–10.5)

## 2010-08-23 LAB — D-DIMER, QUANTITATIVE: D-Dimer, Quant: <0.22 ug/mL-FEU (ref 0.00–0.48)

## 2010-08-23 LAB — TROPONIN I: Troponin I: <0.3 ng/mL (ref <0.30)

## 2010-08-24 LAB — CARDIAC PANEL(CRET KIN+CKTOT+MB+TROPI)
CK, MB: 3.4 ng/mL (ref 0.3–4.0)
CK, MB: 3.1 ng/mL (ref 0.3–4.0)
Relative Index: 2.3 (ref 0.0–2.5)
Relative Index: 2.5 (ref 0.0–2.5)
Total CK: 149 U/L (ref 7–177)
Total CK: 126 U/L (ref 7–177)
Troponin I: <0.3 ng/mL
Troponin I: <0.3 ng/mL

## 2010-08-24 LAB — LIPID PANEL
Cholesterol: 157 mg/dL (ref 0–200)
HDL: 52 mg/dL (ref >39)
LDL Cholesterol: 87 mg/dL (ref 0–99)
Total CHOL/HDL Ratio: 3 RATIO
Triglycerides: 89 mg/dL (ref <150)
VLDL: 18 mg/dL (ref 0–40)

## 2010-08-24 LAB — HEMOGLOBIN A1C
Hgb A1c MFr Bld: 6.3 % — ABNORMAL HIGH
Mean Plasma Glucose: 134 mg/dL — ABNORMAL HIGH

## 2010-08-25 ENCOUNTER — Encounter: Payer: Self-pay | Admitting: Family

## 2010-08-28 ENCOUNTER — Ambulatory Visit (INDEPENDENT_AMBULATORY_CARE_PROVIDER_SITE_OTHER): Payer: 59 | Admitting: Family

## 2010-08-28 ENCOUNTER — Encounter: Payer: Self-pay | Admitting: Family

## 2010-08-28 VITALS — BP 118/86 | HR 84 | Temp 97.8°F | Resp 16 | Wt 146.1 lb

## 2010-08-28 DIAGNOSIS — K219 Gastro-esophageal reflux disease without esophagitis: Secondary | ICD-10-CM

## 2010-08-28 DIAGNOSIS — Z0000 Encounter for general adult medical examination without abnormal findings: Secondary | ICD-10-CM

## 2010-08-28 DIAGNOSIS — R739 Hyperglycemia, unspecified: Secondary | ICD-10-CM | POA: Insufficient documentation

## 2010-08-28 DIAGNOSIS — R0789 Other chest pain: Secondary | ICD-10-CM

## 2010-08-28 DIAGNOSIS — Z9189 Other specified personal risk factors, not elsewhere classified: Secondary | ICD-10-CM

## 2010-08-28 NOTE — Patient Instructions (Signed)
Continue your nexium twice daily. You will be contacted about your referral to Dr. Juanda Chance and for your stress test.

## 2010-08-28 NOTE — Progress Notes (Signed)
Subjective:    Patient ID: Whitney Neal, female    DOB: [birth year], 50 y.o.   MRN: [id number]  HPI  Whitney Neal is a 50 year old female who presents today for post hospital followup. She was hospitalized on July 4 through July 5 with atypical chest pain. It was ultimately decided that her chest pain was due to GERD symptoms. She had associated near syncopal episode prior to the hospitalization which was felt to be vasovagal in nature.during this hospitalization she underwent a 2-D echo which showed normal left ventricular ejection fraction of 60-65% with trivial tricuspid regurgitation. She was also noted to have an elevated hemoglobin A1c during that hospitalization with a viable 6.3. Her blood pressure was noted to be mildly elevated is recommended this be followed up in the outpatient setting. Since returning home she reports improvement in her symptoms. She is tolerating by mouth's without difficulty. She does not some occasional nausea. She has very mild epigastric pain. Her Nexium was increased from once daily to twice daily. Hospitalization records were reviewed. Review of Systems See history of present illness  Past Medical History  Diagnosis Date  . Cervical lymphadenopathy     left  . Ulcer 2010  . Hypercholesteremia   . Urinary incontinence   . GERD (gastroesophageal reflux disease)   . Hypertension   . Constipation   . Hyperplastic colonic polyp     history of   . Epigastric pain   . Cervical strain   . Atypical chest pain   . Osteopenia   . Gastric ulcer   . Barrett's esophagus     history of   . Vitamin D deficiency   . Hyperglycemia     a1c 6.3 7/12    History   Social History  . Marital Status: Married    Spouse Name: N/A    Number of Children: N/A  . Years of Education: N/A   Occupational History  . Not on file.   Social History Main Topics  . Smoking status: Never Smoker   . Smokeless tobacco: Not on file  . Alcohol Use: No  . Drug Use: No  .  Sexually Active: Not on file   Other Topics Concern  . Not on file   Social History Narrative   Reviewed history from 07/04/2009 and no changes required.Married- is guardian for two grandchildren ages 60 and 45.Never SmokedAlcohol use-noRegular exercise-noWorks in housekeeping at [employer]    Past Surgical History  Procedure Date  . Bladder surgery     bladder mesh surgery 2010- Dr. Noland Fordyce  . Colonoscopy w/ polypectomy 2010  . Abdominal hysterectomy 2009    Dr. Ferdinand Cava    Family History  Problem Relation Age of Onset  . Diabetes Sister   . Colon cancer Father   . Pulmonary embolism Mother     deceased  . Hypertension Brother   . Hypertension Sister   . Hypertension Father   . Hypertension Mother   . Cancer Paternal Grandmother     oral  . Thyroid disease Sister     1/2 sister thyroid problem  . Healthy Daughter   . Heart attack Brother 42  . Heart attack Father 27    Allergies  Allergen Reactions  . Codeine     REACTION: headache, visual disturbance    Current Outpatient Prescriptions on File Prior to Visit  Medication Sig Dispense Refill  . esomeprazole (NEXIUM) 40 MG capsule Take 40 mg by mouth 2 (two) times daily.       Marland Kitchen  pravastatin (PRAVACHOL) 40 MG tablet Take 40 mg by mouth daily.        . Calcium Carbonate-Vitamin D (CALCIUM 600+D) 600-400 MG-UNIT per tablet Take 1 tablet by mouth 2 (two) times daily.        . Cholecalciferol (VITAMIN D3) 1000 UNITS tablet Take 1,000 Units by mouth. 3 tablets by mouth once a day        BP 118/86  Pulse 84  Temp(Src) 97.8 F (36.6 C) (Oral)  Resp 16  Wt 146 lb 1.3 oz (66.261 kg)       Objective:   Physical Exam  Constitutional: She appears well-developed and well-nourished.  Cardiovascular: Normal rate and regular rhythm.   Pulmonary/Chest: Effort normal and breath sounds normal.  Abdominal: Soft. Bowel sounds are normal. She exhibits no distension and no mass. There is no tenderness. There is no rebound.    Psychiatric: She has a normal mood and affect. Her behavior is normal. Judgment and thought content normal.          Assessment & Plan:

## 2010-08-28 NOTE — Assessment & Plan Note (Signed)
Patient with history of Barrett's esophagus and what appears to be severe GERD symptoms. I have instructed her to continue the twice daily dosing of Nexium and to followup with Dr. Julio Alm. FMLA forms completed.

## 2010-08-28 NOTE — Assessment & Plan Note (Signed)
Will order a exercise stress test. Patient is agreeable to complete at this time.

## 2010-08-30 ENCOUNTER — Ambulatory Visit (HOSPITAL_BASED_OUTPATIENT_CLINIC_OR_DEPARTMENT_OTHER)
Admission: RE | Admit: 2010-08-30 | Discharge: 2010-08-30 | Disposition: A | Discharge status: Home / Self Care | Payer: 59 | Source: Ambulatory Visit | Attending: Family | Admitting: Family

## 2010-08-30 ENCOUNTER — Telehealth: Payer: Self-pay | Admitting: Family

## 2010-08-30 DIAGNOSIS — Z1231 Encounter for screening mammogram for malignant neoplasm of breast: Secondary | ICD-10-CM | POA: Insufficient documentation

## 2010-08-30 NOTE — Telephone Encounter (Signed)
Per Hughie Closs ok to send letter for pt to return to work with no restrictions. Letter completed and faxed to number below.

## 2010-08-30 NOTE — Telephone Encounter (Signed)
Zebedee Iba from [facility] [employer] [facility]) called stating that she needs a letter faxed over to her stating that it is ok for patient to return to work on July 18th. Fax: [phone].

## 2010-08-31 NOTE — H&P (Signed)
Whitney Neal, SKELTON NO.:  [id number]  MEDICAL RECORD NO.:  [id number]  LOCATION:  4741                         FACILITY:  [facility]  PHYSICIAN:  Jonny Ruiz, MD    DATE OF BIRTH:  [birth year]  DATE OF ADMISSION:  08/23/2010 DATE OF DISCHARGE:                             HISTORY & PHYSICAL   CHIEF COMPLAINT:  Chest pain.  HISTORY OF PRESENT ILLNESS:  The patient is a 50 year old [place] female with a past medical history significant for hypercholesterolemia who has been transferred from [facility] for epigastric and lower chest pain.  The patient was in her usual state of health until December 30, today when she felt chest pressure in the lower anterior chest as well as in the epigastrium radiating 10/10 in severity without associated shortness of breath, palpitations or diaphoresis.  She denied radiation of pain.  She took Tylenol which did not make it much better.  The patient was at work.  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  By 3 p.m., the patient felt like she was going to pass out while holding the baby and a co-worker sent the patient to the nursing department for an initial assessment.  During this evaluation, the patient was found hypertensive and she was subsequently referred to [facility].  The patient was found with a blood pressure of 160/100 and because of a strong family history for ischemic heart disease was referred to [place] for further evaluation. At the present time, the patient feels better and her pain is down to 2/10.  She denies shortness of breath or palpitations.  She stated that she was feeling a neck pressure associated to a numbness in the right cheek since yesterday.  PAST MEDICAL HISTORY:  Hypercholesterolemia and peptic ulcer disease. Cardiac catheterization 15 years ago in [place] and she was found with a tiny hole in her heart.  Records not available at this  time.  SURGICAL HISTORY:  Hysterectomy and bladder procedure for urinary incontinence.  She  probably has a pessary in [place] but patient was not sure.  MEDICATIONS: 1. Pravastatin 40 mg a day. 2. Nexium 40 mg a day.  ALLERGIES:  CODEINE causes nausea and vomiting.  FAMILY HISTORY:  The mother died from complications of thromboembolism. The father had prostate cancer.  The grandfather on the mother's side had a myocardial infarction at an old age.  The grandmother in the father side had oral cancer.  She has a twin brother with hypertension. She has 1 sister with hypertension, diabetes, and ischemic heart disease and she has 5 other brothers with ischemic heart disease.  She has a total of 14 siblings.  She denies tobacco, alcohol or illicit.  She is married and they have 1 child together.  She had a miscarriage and they adopted 2 children.  She is a Engineer, civil (consulting) and works at [employer].  She is from the [place], used to live in [place] and moved to [place] 6 years ago.  REVIEW OF SYSTEMS:  CONSTITUTIONAL:  Denies fever, chills, night sweats, fatigue, malaise, weight gain, or weight loss.  NEUROLOGIC:  Positive for  numbness on the right cheek which has now resolved.  Denied headache, focal weakness, numbness or paresthesias.  Positive for lightheadedness and dizziness at 3 o'clock today.  When the patient felt like she was going to pass out did not lose consciousness or fell. CARDIOVASCULAR:  As in HPI.  Negative orthopnea, nocturia, or PND. RESPIRATORY:  Denies cough, shortness of breath, wheezes or hemoptysis. No history of tuberculosis.  GASTROINTESTINAL:  Denies heartburn, but complained of epigastric abdominal pain earlier today.  No Reflux.  No diarrhea or constipation.  No melena.  GENITOURINARY:  Positive for occasional urinary incontinence with stress.  Denies dysuria, frequency or hematuria.  MUSCULOSKELETAL:  Denies arthralgias, myalgias,  joint swelling or morning stiffness.  DERMATOLOGIC:  Denies skin rashes or growth.  PHYSICAL EXAMINATION:  VITAL SIGNS:  Temperature 98.4, pulse 78, respirations 18, BP 151/90, pulse ox 100% on room air. GENERAL APPEARANCE:  The patient is a healthy-looking Latino female who is in no distress.  She is pleasant and cooperative SKIN MUCOSA: Normal. HEENT:  Unremarkable. NECK:  Supple.  No lymphadenopathy.  No JVD.  Thyroid normal size and nontender.  No nodules. CHEST:  Symmetric.  No tenderness on palpation.  Auscultation, regular S1 and S2 without gallops, murmurs or rubs. LUNGS:  Clear to auscultation. ABDOMEN:  Soft, nontender without organomegaly or masses palpable. EXTREMITIES:  No clubbing, cyanosis or edema.  Pulse is 2+ posterior tibialis and dorsalis pedis. NEUROLOGIC:  Awake, alert and oriented x3.  PERRLA, EOMI.  Cranial nerves II-XII intact.  Motor strength 5/5 to upper and lower extremities.  Sensory 2+ DTR.  Babinski negative.  Gait normal.  LABORATORY DATA:  CBC normal.  CMP normal.  CK-MB and troponin negative. Chest x-ray negative.  EKG, normal sinus rhythm at 74 beats per minute. Nonspecific T-wave changes.  D-dimer pending resolved.  IMPRESSION:  A 50 year old female with a past medical history significant for hyperglyceridemia and a very strong family history for ischemic heart disease presenting to the hospital for chest pain and dizziness/presyncope and found with an elevated high blood pressure and made patient to telemetry unit.  Rule out MI.  Stress test in a.m. Cardiology consult pending.  DVT prophylaxis per protocol.  Continue pravastatin.  Add aspirin 81 mg a day.  For her hypertension, lisinopril 10 mg a day, amlodipine 5 mg a day.          ______________________________ Jonny Ruiz, MD     GL/MEDQ  D:  08/23/2010  T:  08/24/2010  Job:  [id number]  Electronically Signed by Jonny Ruiz MD on 08/31/2010 04:49:52 PM

## 2010-09-08 NOTE — Discharge Summary (Signed)
Whitney Neal, DLOUHY NO.:  [id number]  MEDICAL RECORD NO.:  [id number]  LOCATION:  4982                         FACILITY:  [facility]  PHYSICIAN:  Julieta Bellini, MDDATE OF BIRTH:  11/10/1960  DATE OF ADMISSION:  08/23/2010 DATE OF DISCHARGE:  08/24/2010                              DISCHARGE SUMMARY   DISCHARGE DIAGNOSES: 1. Noncardiac chest pain secondary to gastroesophageal reflux disease. 2. Near syncope episode secondary to vasovagal stimulation due to the     pain. 3. Hypertension, most likely secondary to her pain. 4. Elevated hemoglobin A1c without history of diabetes with just mild     hyperglycemia, A1c was 6.3. 5. Hyperlipidemia. 6. Vaginal dryness secondary to menopause.  DISCHARGE MEDICATIONS: 1. Enteric-coated aspirin 81 mg 1 tablet by mouth daily. 2. Nexium 40 mg 1 capsule by mouth twice a day. 3. Pravastatin 20 mg 1 tablet by mouth daily at bedtime. 4. Premarin cream 1 application vaginally Tuesday and Saturday. 5. Tylenol 325 mg 1-2 tablets by mouth every 6 hours as needed for     headache.  DISPOSITION AND FOLLOWUP:  The patient had been discharged in stable improved condition with a negative orthostatic vital signs, currently not complaining of any chest pain, any nausea, or any vomiting, and her blood pressure since admission is in normal range after having the pain in her epigastric area controlled.  She does not have a history of hypertension.  At this moment, the patient is going to be seen over the next 7-10 days by Inda Castle, NP, at her office.  It is going to be important to address another blood pressure check up to make sure that she does not have hypertension.  We are not going to start the patient on any blood pressure medications at this point and we are going to let primary care physician to see her to repeat blood pressure and if elevated, to start and adjust the doses as needed.  Next, it is going to be important for  the primary care physician to keep a close eye on the patient's gastroesophageal reflux disease since it had been brought to my attention that she had been diagnosed in December 2011, with a Barrett esophagus by EGD that was done at that time and it is something that needs to be followed up to make sure that it is not progressing. Also, her Nexium had been increased to twice a day.  Since despite being on the medication, she is still complaining of reflux discomfort.  The patient will need to have another fasting glucose to rule out that she is not having any prediabetic findings since her A1c is 6.3 and her blood sugar was just mildly elevated at 106-110.  The patient had a strong family history for diabetes.  Regarding her hyperlipidemia, we are going to continue her on pravastatin and a fasting lipid profile is going to be followed by primary care physician.  HISTORY OF PRESENT ILLNESS AND LABORATORY DATA:  For full details, please refer to dictation done by Dr. Jannet Mantis on August 23, 2010, but briefly, this is a 50 year old Whitney Neal with a past medical history significant for hypercholesterolemia and gastroesophageal reflux disease who came  into the hospital after she experienced a near syncope episode with a chest pressure in the lower chest/epigastric area, 10/10 in severity with associated palpitations.  The patient denies any radiation of the pain.  She said that she took some Tylenol and did not make any difference.  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  She was transferred for further evaluation and treatment.  PERTINENT LABORATORY DATA:  Throughout this hospitalization after being admitted for ruling out ACS, she had a CBC with differential on admission with a white blood cells 6.1, hemoglobin 13.7, platelet 303, a complete metabolic panel demonstrated a sodium of 140, potassium 3.9, chloride 102, bicarb 29,  glucose 106, creatinine 0.7, BUN 17.  LFTs within normal limits.  Cardiac markers x3 negative  D-dimer less than 0.22 which is within normal limits.  Lipid profile with a triglycerides of 89, total cholesterol 157, HDL 52, LDL 87.  Hemoglobin A1c is 6.3. The patient also had a chest x-ray that demonstrated no acute cardiopulmonary disease and a 2D echo.  HOSPITAL COURSE BY PROBLEM: 1. The patient's chest pain, noncardiac, having negative cardiac     enzymes, no EKG changes, and no abnormalities in telemetry, and     also with a normal 2D echo with a normal ejection fraction between     60 and 65%, and no hypokinesis or wall motion abnormalities and     just trivial tricuspid regurgitation.  It was thought that it is     most likely secondary to her gastroesophageal reflux disease which     despite being on Nexium once a day, is still causing her to have     some discomfort.  She had a recent EGD in December 2011, that     showed findings consistently with Barrett esophagus, so we are     going to increase the dose of her Nexium to twice a day and she     needs to follow closely for a repeat EGD to make sure that this is     not worsening despite the use of PPI.  If that is the case, the     patient might be a candidate for surgery in order to fix her reflux     disease. 2. Near syncope, most likely vasovagal secondary to the severe pain     that she has experienced.  She is not orthostatic and at this     moment, her neurologic exam is completely normal. 3. Hypertension, most likely secondary to pain.  Throughout the whole     hospitalization, the patient's blood pressure had been in the     normal range and she does not have a history of hypertension.  At     this point, we are going to hold on adding any new medication for     blood pressure control and we are going to allow patient's primary     care physician to repeat a blood pressure in the outpatient setting     and if needed to  start one medication at a time to control her     blood pressure and adjust the doses as needed.  The patient had     been instructed to follow a low-sodium diet and also to lose some     weight which will help with the blood pressure issue. 4. Elevated hemoglobin A1c with a history of diabetes and a mild  hyperglycemia, this could be a prediabetes findings.  The patient     was instructed to follow a low-carbohydrate diet and to follow with     primary care physician in order to have another fasting glucose and     to determine if she will require any oral hypoglycemic agent. 5. Hyperlipidemia, well controlled.  We are going to continue to use     of Pravachol.  DISCHARGE PHYSICAL EXAMINATION:  VITAL SIGNS:  Temperature 98.3, heart rate 76, respiratory rate 20, blood pressure 120/81, oxygen saturation 100% on room air. GENERAL:  The patient was in no acute distress, feeling okay and back to her baseline. RESPIRATORY SYSTEM:  Clear to auscultation bilaterally. CARDIOVASCULAR:  Regular rate and rhythm.  No murmurs, gallops, or rubs. ABDOMEN:  Soft, nondistended.  Positive bowel sounds. EXTREMITIES:  Without edema, cyanosis, or clubbing. NEUROLOGIC:  Nonfocal. PSYCHIATRIC:  Appropriate.     Julieta Bellini, MD     CEM/MEDQ  D:  08/24/2010  T:  08/25/2010  Job:  [id number]  cc:   Debbrah Alar, NP [place]  Electronically Signed by Barton Dubois MD on 09/08/2010 04:18:14 PM

## 2010-09-25 ENCOUNTER — Encounter: Payer: Self-pay | Admitting: Internal Medicine

## 2010-09-25 ENCOUNTER — Ambulatory Visit (INDEPENDENT_AMBULATORY_CARE_PROVIDER_SITE_OTHER): Payer: 59 | Admitting: Internal Medicine

## 2010-09-25 VITALS — BP 134/76 | HR 82 | Ht 60.0 in | Wt 147.0 lb

## 2010-09-25 DIAGNOSIS — K2270 Barrett's esophagus without dysplasia: Secondary | ICD-10-CM

## 2010-09-25 NOTE — Patient Instructions (Signed)
Whitney Neal

## 2010-09-25 NOTE — Progress Notes (Signed)
Whitney Neal 1960/09/04 MRN [id number]        History of Present Illness:  This is a 50 year old Hispanic female with Barrett's esophagus, recently seen in the emergency room with chest pain. Cardiac workup was negative. She was hospitalized the from July 4 to July 5. She was started on Nexium 40 mg twice a day with complete relief of the chest pain. Upper endoscopy in March 2010  showed gastric ulcer and Barrett's esophagus. Repeat upper endoscopy in December 2011 again showed intestinal and goblet  cell metaplasia. She denies hoarseness, cough or difficulty in swallowing   Past Medical History  Diagnosis Date  . Cervical lymphadenopathy     left  . Ulcer 2010  . Hypercholesteremia   . Urinary incontinence   . GERD (gastroesophageal reflux disease)   . Hypertension   . Constipation   . Hyperplastic colonic polyp     history of   . Epigastric pain   . Cervical strain   . Atypical chest pain   . Osteopenia   . Gastric ulcer   . Barrett's esophagus     history of   . Vitamin D deficiency   . Hyperglycemia     a1c 6.3 7/12  . Hyperplastic colon polyp    Past Surgical History  Procedure Date  . Bladder surgery     bladder mesh surgery 2010- Dr. Noland Fordyce  . Colonoscopy w/ polypectomy 2010  . Abdominal hysterectomy 2009    Dr. Ferdinand Cava    reports that she has never smoked. She does not have any smokeless tobacco history on file. She reports that she does not drink alcohol or use illicit drugs. family history includes Cancer in her paternal grandmother; Colon cancer in her father; Diabetes in her sister; Healthy in her daughter; Heart attack (age of onset:42) in her brother; Heart attack (age of onset:76) in her father; Hypertension in her brother, father, mother, and sister; Pulmonary embolism in her mother; and Thyroid disease in her sister. Allergies  Allergen Reactions  . Codeine     REACTION: headache, visual disturbance        Review of Systems: Negative for change  in bowel habits, abdominal pain shortness of breath  The remainder of the 10  point ROS is negative except as outlined in H&P   Physical Exam: General appearance  Well developed in no distress Eyes- non icteric HEENT nontraumatic, normocephalic Mouth no lesions, tongue papillated, no cheilosis Neck supple without adenopathy, thyroid not enlarged, no carotid bruits, no JVD Lungs Clear to auscultation bilaterally Cor normal S1 normal S2, regular rhythm , no murmur,  quiet precordium Abdomen soft nontender abdomen with normoactive bowel sounds. Rectal: Not done Extremities no pedal edema Skin no lesions Neurological alert and oriented x3 Psychological normal mood and  affect  Assessment and Plan:  Noncardiac chest pain likely related to gastroesophageal reflux. She has a known Barrett's esophagus and has responded to increased dose of Nexium from 40-80 mg daily. She will remain on this dose until her next upper endoscopy in December 2013 areas she will also continue antireflux measures. He may take antacids on a when necessary basis   09/25/2010 Lina Sar

## 2010-10-16 ENCOUNTER — Other Ambulatory Visit: Payer: Self-pay | Admitting: Family

## 2010-10-20 MED ORDER — PRAVASTATIN SODIUM 40 MG PO TABS: 40.0000 mg | ORAL_TABLET | Freq: Every day | ORAL | Status: DC

## 2010-10-20 NOTE — Telephone Encounter (Signed)
Refill called to Pam at [facility] for Pravastatin 40mg  1 tablet daily #90 x no refills.

## 2010-11-27 ENCOUNTER — Ambulatory Visit (INDEPENDENT_AMBULATORY_CARE_PROVIDER_SITE_OTHER): Payer: 59 | Admitting: Family

## 2010-11-27 ENCOUNTER — Telehealth: Payer: Self-pay | Admitting: Family

## 2010-11-27 ENCOUNTER — Ambulatory Visit (HOSPITAL_BASED_OUTPATIENT_CLINIC_OR_DEPARTMENT_OTHER)
Admission: RE | Admit: 2010-11-27 | Discharge: 2010-11-27 | Disposition: A | Discharge status: Home / Self Care | Payer: 59 | Source: Ambulatory Visit | Attending: Family | Admitting: Family

## 2010-11-27 ENCOUNTER — Encounter: Payer: Self-pay | Admitting: Family

## 2010-11-27 DIAGNOSIS — I10 Essential (primary) hypertension: Secondary | ICD-10-CM

## 2010-11-27 DIAGNOSIS — M549 Dorsalgia, unspecified: Secondary | ICD-10-CM

## 2010-11-27 DIAGNOSIS — Z9189 Other specified personal risk factors, not elsewhere classified: Secondary | ICD-10-CM

## 2010-11-27 DIAGNOSIS — R1013 Epigastric pain: Secondary | ICD-10-CM | POA: Insufficient documentation

## 2010-11-27 DIAGNOSIS — R7309 Other abnormal glucose: Secondary | ICD-10-CM

## 2010-11-27 DIAGNOSIS — R739 Hyperglycemia, unspecified: Secondary | ICD-10-CM

## 2010-11-27 LAB — HEPATIC FUNCTION PANEL
ALT: 16 U/L (ref 0–35)
AST: 19 U/L (ref 0–37)
Albumin: 4.3 g/dL (ref 3.5–5.2)
Alkaline Phosphatase: 87 U/L (ref 39–117)
Bilirubin, Direct: <0.1 mg/dL (ref 0.0–0.3)
Total Bilirubin: 0.3 mg/dL (ref 0.3–1.2)
Total Protein: 7.1 g/dL (ref 6.0–8.3)

## 2010-11-27 LAB — LIPASE: Lipase: 31 U/L (ref 0–75)

## 2010-11-27 LAB — HEMOGLOBIN A1C
Hgb A1c MFr Bld: 6.1 % — ABNORMAL HIGH (ref <5.7)
Mean Plasma Glucose: 128 mg/dL — ABNORMAL HIGH (ref <117)

## 2010-11-27 MED ORDER — OMEPRAZOLE-SODIUM BICARBONATE 40-1100 MG PO CAPS: 1.0000 | ORAL_CAPSULE | Freq: Two times a day (BID) | ORAL | Status: DC

## 2010-11-27 NOTE — Telephone Encounter (Signed)
Pt.notified

## 2010-11-27 NOTE — Telephone Encounter (Signed)
Received call from [facility] at [facility] pharmacy stating Zegerid rx went through for $7 copay and is now covered. Prior auth not required.

## 2010-11-27 NOTE — Assessment & Plan Note (Signed)
Pt underwent an abdominal ultrasound today to evaluate for cholelithiasis/cholecystitis.  Ultrasound is unremarkable.  I suspect that her epigastric pain is GI in nature.  Will have her transition from BID nexium to BID Zegerid to see if this helps. She was advised against the use of NSAIDS which she tells me she has not been using on a regular basis.  Will also have her complete a stress test to be thorough.

## 2010-11-27 NOTE — Telephone Encounter (Signed)
Reviewed abdominal US- negative.  Will have pt start zegerid BID in place of nexium.  Rx sent to pharmacy downstairs.  Left message on cell for pt to return our call.

## 2010-11-27 NOTE — Telephone Encounter (Signed)
Received call from pharmacy that Zegerid will require prior authorization. They will fax contact info.

## 2010-11-27 NOTE — Assessment & Plan Note (Signed)
BP is well controlled with diet at this point.  She is exercising and losing weight.

## 2010-11-27 NOTE — Patient Instructions (Signed)
Please complete your blood work and ultrasound on the first floor. Follow up in 1 month, sooner if problems or concerns.

## 2010-11-27 NOTE — Telephone Encounter (Signed)
Left message on machine to return my call. 

## 2010-11-27 NOTE — Assessment & Plan Note (Signed)
Will send for stress test, pt is agreeable to complete.

## 2010-11-27 NOTE — Progress Notes (Signed)
Subjective:    Patient ID: Whitney Neal, female    DOB: 01-12-1961, 50 y.o.   MRN: [id number]  HPI  [name] is a 50 yr old female who presents today for follow up.  1) HTN- She is watching her sodium intake.  She and her husband are walking together in the evenings.    2)GERD- notes Epigastric pain- radiates around chest between shoulder blades.  She reports that the pain is intermittent in nature.  Saw Dr. Lina Neal who recommended follow up EGD next year and that she continue on a BID PPI in the meantime. No improvement with tylenol or ibuprofen. Denies associate nausea.  This pain is present at rest and is not worsened by movement.  Some improvement with the use of heating pad.   3) History of Atypical Chest pain- She has not completed her stress test.  She is agreeable to do so.       Review of Systems See HPI  Past Medical History  Diagnosis Date  . Cervical lymphadenopathy     left  . Ulcer 2010  . Hypercholesteremia   . Urinary incontinence   . GERD (gastroesophageal reflux disease)   . Hypertension   . Constipation   . Hyperplastic colonic polyp     history of   . Epigastric pain   . Cervical strain   . Atypical chest pain   . Osteopenia   . Gastric ulcer   . Barrett's esophagus     history of   . Vitamin D deficiency   . Hyperglycemia     a1c 6.3 7/12  . Hyperplastic colon polyp     History   Social History  . Marital Status: Married    Spouse Name: N/A    Number of Children: N/A  . Years of Education: N/A   Occupational History  . Not on file.   Social History Main Topics  . Smoking status: Never Smoker   . Smokeless tobacco: Not on file  . Alcohol Use: No  . Drug Use: No  . Sexually Active: Not on file   Other Topics Concern  . Not on file   Social History Narrative   Reviewed history from 07/04/2009 and no changes required.Married- is guardian for two grandchildren ages 49 and 62.Never SmokedAlcohol use-noRegular  exercise-noWorks in housekeeping at [employer]    Past Surgical History  Procedure Date  . Bladder surgery     bladder mesh surgery 2010- Dr. Noland Neal  . Colonoscopy w/ polypectomy 2010  . Abdominal hysterectomy 2009    Whitney Neal    Family History  Problem Relation Age of Onset  . Diabetes Sister   . Colon cancer Father   . Pulmonary embolism Mother     deceased  . Hypertension Brother   . Hypertension Sister   . Hypertension Father   . Hypertension Mother   . Cancer Paternal Grandmother     oral  . Thyroid disease Sister     1/2 sister thyroid problem  . Healthy Daughter   . Heart attack Brother 42  . Heart attack Father 29    Allergies  Allergen Reactions  . Codeine     REACTION: headache, visual disturbance    Current Outpatient Prescriptions on File Prior to Visit  Medication Sig Dispense Refill  . aspirin 81 MG EC tablet Take 81 mg by mouth daily.        . Calcium Carbonate-Vitamin D (CALCIUM 600+D) 600-400 MG-UNIT per tablet Take  1 tablet by mouth 2 (two) times daily.        Whitney Neal Kitchen esomeprazole (NEXIUM) 40 MG capsule Take 40 mg by mouth 2 (two) times daily.       Whitney Neal Kitchen estradiol (ESTRACE) 0.1 MG/GM vaginal cream Place 2 g vaginally. Two times a week.       . pravastatin (PRAVACHOL) 40 MG tablet Take 1 tablet (40 mg total) by mouth daily.  90 tablet  0    BP 116/80  Pulse 78  Temp(Src) 98 F (36.7 C) (Oral)  Resp 16  Wt 149 lb 1.9 oz (67.64 kg)       Objective:   Physical Exam  Constitutional: She appears well-developed and well-nourished.  HENT:  Head: Normocephalic and atraumatic.  Cardiovascular: Normal rate and regular rhythm.   No murmur heard. Pulmonary/Chest: Breath sounds normal. No respiratory distress. She has no wheezes. She has no rales. She exhibits no tenderness.  Abdominal: Soft. Bowel sounds are normal. She exhibits no distension and no mass. There is no tenderness. There is no rebound and no guarding.  Psychiatric: She has a normal mood  and affect. Her behavior is normal. Judgment and thought content normal.          Assessment & Plan:

## 2010-11-27 NOTE — Telephone Encounter (Signed)
Great - thanks

## 2010-11-28 ENCOUNTER — Encounter: Payer: Self-pay | Admitting: Family

## 2010-12-11 ENCOUNTER — Telehealth: Payer: Self-pay | Admitting: Family

## 2010-12-11 NOTE — Telephone Encounter (Signed)
Prior Auth obtained for pt's stress test:  [id number], good for 30 days.

## 2010-12-12 ENCOUNTER — Ambulatory Visit (HOSPITAL_COMMUNITY): Payer: 59 | Attending: Family | Admitting: Radiology

## 2010-12-12 VITALS — Ht 60.0 in | Wt 146.0 lb

## 2010-12-12 DIAGNOSIS — R0602 Shortness of breath: Secondary | ICD-10-CM

## 2010-12-12 DIAGNOSIS — Z9189 Other specified personal risk factors, not elsewhere classified: Secondary | ICD-10-CM

## 2010-12-12 DIAGNOSIS — R079 Chest pain, unspecified: Secondary | ICD-10-CM

## 2010-12-12 DIAGNOSIS — R0789 Other chest pain: Secondary | ICD-10-CM | POA: Insufficient documentation

## 2010-12-12 MED ORDER — TECHNETIUM TC 99M TETROFOSMIN IV KIT
11.0000 | PACK | Freq: Once | INTRAVENOUS | Status: AC | PRN
  Administered 2010-12-12: 11 via INTRAVENOUS

## 2010-12-12 MED ORDER — TECHNETIUM TC 99M TETROFOSMIN IV KIT
33.0000 | PACK | Freq: Once | INTRAVENOUS | Status: AC | PRN
  Administered 2010-12-12: 33 via INTRAVENOUS

## 2010-12-12 NOTE — Progress Notes (Signed)
[facility] [place] [address] [phone]  Cardiology Nuclear Med Study  CAITLYNN JU is a 50 y.o. female [id number] 1960/02/25   Nuclear Med Background Indication for Stress Test:  Evaluation for Ischemia and 7/12 [facility] with Chest pain secondary to GERD,near syncope and negative enzymes History:  7/12 Echo-EF60-65%mild TR;10 yrsMPS NL per pt Cardiac Risk Factors: Family History - CAD, Hypertension and Lipids  Symptoms:  Chest Pain(intermittent/sharp to shoulder blades), Dizziness, DOE, Fatigue with Exertion, Light-Headedness and Near Syncope   Nuclear Pre-Procedure Caffeine/Decaff Intake:  None NPO After: 7:00pm   Lungs:  clear IV 0.9% NS with Angio Cath:  22g  IV Site: R Hand  IV Started by:  Matilde Haymaker, RN  Chest Size (in):  36 Cup Size: B  Height: 5' (1.524 m)  Weight:  146 lb (66.225 kg)  BMI:  Body mass index is 28.51 kg/(m^2). Tech Comments:  n/a    Nuclear Med Study 1 or 2 day study: 1 day  Stress Test Type:  Stress  Reading MD: Jenkins Rouge, MD  Order Authorizing Provider:  Janene Madeira  Resting Radionuclide: Technetium 70mTetrofosmin  Resting Radionuclide Dose: 11.0 mCi   Stress Radionuclide:  Technetium 949metrofosmin  Stress Radionuclide Dose: 33.0 mCi          Stress Protocol Rest HR: 75 Stress HR: 160  Rest BP: 117/86 Stress BP: 173/91  Exercise Time (min): 8:0266m METS: 10.10   Predicted Max HR: 168 bpm % Max HR: 95.24 bpm Rate Pressure Product: 27680   Dose of Adenosine (mg):  n/a Dose of Lexiscan: n/a mg  Dose of Atropine (mg): n/a Dose of Dobutamine: n/a mcg/kg/min (at max HR)  Stress Test Technologist: TerIleene HutchinsonMT-P  Nuclear Technologist:  SteCharlton AmorNMT     Rest Procedure:  Myocardial perfusion imaging was performed at rest 45 minutes following the intravenous administration of Technetium 37m25mrofosmin. Rest ECG: NSR  Stress Procedure:  The  patient exercised for 8:02mi33m The patient stopped due to SOB and c/o sharp c/p after exercise going into recovery that subsided within the first minute of recovery. There were no significant ST-T wave changes.  Technetium 37m T55mfosmin was injected at peak exercise and myocardial perfusion imaging was performed after a brief delay. Stress ECG: No significant change from baseline ECG  QPS Raw Data Images:  Normal; no motion artifact; normal heart/lung ratio. Stress Images:  Normal homogeneous uptake in all areas of the myocardium. Rest Images:  Normal homogeneous uptake in all areas of the myocardium. Subtraction (SDS):  Normal Transient Ischemic Dilatation (Normal <1.22):  1.02 Lung/Heart Ratio (Normal <0.45):  0.39  Quantitative Gated Spect Images QGS EDV:  54 ml QGS ESV:  16 ml QGS cine images:  NL LV Function; NL Wall Motion QGS EF: 70%  Impression Exercise Capacity:  Fair exercise capacity. BP Response:  Normal blood pressure response. Clinical Symptoms:  Atypical sharp chest pain ECG Impression:  No significant ST segment change suggestive of ischemia. Comparison with Prior Nuclear Study: No images to compare  Overall Impression:  Normal stress nuclear study.  EF 70%      Landrum Carbonell Jenkins Rouge

## 2010-12-13 ENCOUNTER — Telehealth: Payer: Self-pay | Admitting: Family

## 2010-12-13 NOTE — Telephone Encounter (Signed)
Pt.notified

## 2010-12-13 NOTE — Telephone Encounter (Signed)
Please call pt and let her know that I have reviewed her stress test and her results are normal.

## 2011-01-05 ENCOUNTER — Encounter: Payer: Self-pay | Admitting: Family

## 2011-01-05 ENCOUNTER — Ambulatory Visit (INDEPENDENT_AMBULATORY_CARE_PROVIDER_SITE_OTHER): Payer: 59 | Admitting: Family

## 2011-01-05 DIAGNOSIS — R7309 Other abnormal glucose: Secondary | ICD-10-CM

## 2011-01-05 DIAGNOSIS — I10 Essential (primary) hypertension: Secondary | ICD-10-CM

## 2011-01-05 DIAGNOSIS — R202 Paresthesia of skin: Secondary | ICD-10-CM | POA: Insufficient documentation

## 2011-01-05 DIAGNOSIS — R1013 Epigastric pain: Secondary | ICD-10-CM

## 2011-01-05 DIAGNOSIS — R200 Anesthesia of skin: Secondary | ICD-10-CM | POA: Insufficient documentation

## 2011-01-05 DIAGNOSIS — E785 Hyperlipidemia, unspecified: Secondary | ICD-10-CM

## 2011-01-05 DIAGNOSIS — Z8719 Personal history of other diseases of the digestive system: Secondary | ICD-10-CM

## 2011-01-05 DIAGNOSIS — E559 Vitamin D deficiency, unspecified: Secondary | ICD-10-CM

## 2011-01-05 DIAGNOSIS — R739 Hyperglycemia, unspecified: Secondary | ICD-10-CM

## 2011-01-05 DIAGNOSIS — R209 Unspecified disturbances of skin sensation: Secondary | ICD-10-CM

## 2011-01-05 MED ORDER — PRAVASTATIN SODIUM 40 MG PO TABS: 40.0000 mg | ORAL_TABLET | Freq: Every day | ORAL | Status: DC

## 2011-01-05 NOTE — Assessment & Plan Note (Signed)
This happened once last night. Advised pt to let us know if symptoms worsen.

## 2011-01-05 NOTE — Patient Instructions (Signed)
Please follow up in 3 months.  

## 2011-01-05 NOTE — Assessment & Plan Note (Signed)
Last A1C stable.  Will need A1C at next visit.Whitney Neal

## 2011-01-05 NOTE — Assessment & Plan Note (Signed)
Stable on pravastatin, continue same.  

## 2011-01-05 NOTE — Assessment & Plan Note (Signed)
Continue Zegerid bid.

## 2011-01-05 NOTE — Assessment & Plan Note (Signed)
Pt continues calcium + D supplement.

## 2011-01-05 NOTE — Assessment & Plan Note (Signed)
BP Readings from Last 3 Encounters:  01/05/11 110/80  11/27/10 116/80  09/25/10 134/76  BP stable on diet control only, monitor.

## 2011-01-05 NOTE — Progress Notes (Signed)
Subjective:    Patient ID: Whitney Neal, female    DOB: February 27, 1960, 50 y.o.   MRN: [id number]  HPI  [name] is a 50 yr old female who presents today for follow up.   HTN-  Diet controlled.    Barretts Esophagus/Epigastric pain- resolved on Zegerid. She is followed by Dr. Lina Sar. She had a negative nuclear stress test in October.  Numbness- Last night she had some numbness in her fingers left hand. Resolved this AM.            Review of Systems    see HPI  Past Medical History  Diagnosis Date  . Cervical lymphadenopathy     left  . Ulcer 2010  . Hypercholesteremia   . Urinary incontinence   . GERD (gastroesophageal reflux disease)   . Hypertension   . Constipation   . Hyperplastic colonic polyp     history of   . Epigastric pain   . Cervical strain   . Atypical chest pain   . Osteopenia   . Gastric ulcer   . Barrett's esophagus     history of   . Vitamin D deficiency   . Hyperglycemia     a1c 6.3 7/12  . Hyperplastic colon polyp     History   Social History  . Marital Status: Married    Spouse Name: N/A    Number of Children: N/A  . Years of Education: N/A   Occupational History  . Not on file.   Social History Main Topics  . Smoking status: Never Smoker   . Smokeless tobacco: Not on file  . Alcohol Use: No  . Drug Use: No  . Sexually Active: Not on file   Other Topics Concern  . Not on file   Social History Narrative   Reviewed history from 07/04/2009 and no changes required.Married- is guardian for two grandchildren ages 29 and 42.Never SmokedAlcohol use-noRegular exercise-noWorks in housekeeping at [employer]    Past Surgical History  Procedure Date  . Bladder surgery     bladder mesh surgery 2010- Dr. Noland Fordyce  . Colonoscopy w/ polypectomy 2010  . Abdominal hysterectomy 2009    Dr. Ferdinand Cava    Family History  Problem Relation Age of Onset  . Diabetes Sister   . Colon cancer Father   . Pulmonary embolism Mother      deceased  . Hypertension Brother   . Hypertension Sister   . Hypertension Father   . Hypertension Mother   . Cancer Paternal Grandmother     oral  . Thyroid disease Sister     1/2 sister thyroid problem  . Healthy Daughter   . Heart attack Brother 42  . Heart attack Father 56    Allergies  Allergen Reactions  . Codeine     REACTION: headache, visual disturbance    Current Outpatient Prescriptions on File Prior to Visit  Medication Sig Dispense Refill  . aspirin 81 MG EC tablet Take 81 mg by mouth daily.        . Calcium Carbonate-Vitamin D (CALCIUM 600+D) 600-400 MG-UNIT per tablet Take 1 tablet by mouth 2 (two) times daily.        Marland Kitchen estradiol (ESTRACE) 0.1 MG/GM vaginal cream Place 2 g vaginally. Two times a week.         BP 110/80  Pulse 78  Temp(Src) 98.2 F (36.8 C) (Oral)  Resp 16  Ht 5' (1.524 m)  Wt 150 lb 0.6 oz (  68.058 kg)  BMI 29.30 kg/m2    Objective:   Physical Exam  Constitutional: She appears well-developed and well-nourished. No distress.  Cardiovascular: Normal rate and regular rhythm.   No murmur heard. Pulmonary/Chest: Effort normal and breath sounds normal. No respiratory distress. She has no wheezes. She has no rales. She exhibits no tenderness.  Abdominal: Soft. Bowel sounds are normal. She exhibits no distension and no mass. There is no tenderness. There is no rebound and no guarding.  Musculoskeletal: She exhibits no edema.          Assessment & Plan:

## 2011-01-05 NOTE — Assessment & Plan Note (Addendum)
Pt had negative stress test.  Pain is resolved since she has been placed on zegerid. Continue same.

## 2011-01-19 ENCOUNTER — Other Ambulatory Visit: Payer: Self-pay | Admitting: Family

## 2011-01-19 ENCOUNTER — Telehealth: Payer: Self-pay | Admitting: *Deleted

## 2011-01-19 NOTE — Telephone Encounter (Signed)
Received message from pt that she thinks she has a sinus infection and is requesting an antibiotic be sent to her pharmacy. Left message for pt to return my call. Pt will need to be seen for Rx. Per Melissa, ok to offer pt 4pm slot today.

## 2011-01-19 NOTE — Telephone Encounter (Signed)
Rx called in on 01/05/11 #90 x 1 refill. Verbal given to [place] per 01/05/11 documentation.

## 2011-01-22 NOTE — Telephone Encounter (Signed)
Spoke with pt and she reports improvement of symptoms with OTC treatment. Advised pt to call if symptoms do not resolve or get worse. Pt voices understanding.

## 2011-04-06 ENCOUNTER — Ambulatory Visit: Payer: 59 | Admitting: Family

## 2011-04-09 ENCOUNTER — Ambulatory Visit (INDEPENDENT_AMBULATORY_CARE_PROVIDER_SITE_OTHER): Payer: 59 | Admitting: Family

## 2011-04-09 ENCOUNTER — Encounter: Payer: Self-pay | Admitting: Family

## 2011-04-09 DIAGNOSIS — K219 Gastro-esophageal reflux disease without esophagitis: Secondary | ICD-10-CM

## 2011-04-09 DIAGNOSIS — R7309 Other abnormal glucose: Secondary | ICD-10-CM

## 2011-04-09 DIAGNOSIS — R739 Hyperglycemia, unspecified: Secondary | ICD-10-CM

## 2011-04-09 DIAGNOSIS — I10 Essential (primary) hypertension: Secondary | ICD-10-CM

## 2011-04-09 DIAGNOSIS — E785 Hyperlipidemia, unspecified: Secondary | ICD-10-CM

## 2011-04-09 MED ORDER — PRAVASTATIN SODIUM 40 MG PO TABS: 40.0000 mg | ORAL_TABLET | Freq: Every day | ORAL | Status: DC

## 2011-04-09 MED ORDER — OMEPRAZOLE-SODIUM BICARBONATE 40-1100 MG PO CAPS: 1.0000 | ORAL_CAPSULE | Freq: Two times a day (BID) | ORAL | Status: DC

## 2011-04-09 NOTE — Assessment & Plan Note (Signed)
BP remains stable with diet alone. Monitor.

## 2011-04-09 NOTE — Assessment & Plan Note (Signed)
Stable on PPI. Continue Zegerid.

## 2011-04-09 NOTE — Assessment & Plan Note (Signed)
Lipids at goal last visit. Continue pravastatin.

## 2011-04-09 NOTE — Assessment & Plan Note (Signed)
Her weight has been stable. Working on diet.  Plan to check A1C next visit.

## 2011-04-09 NOTE — Progress Notes (Signed)
Subjective:    Patient ID: Whitney Neal, female    DOB: [birth year], 51 y.o.   MRN: [id number]  HPI  Whitney Neal is a 51 yr old female who presents today for follow up.   HTN-  She continues to watch her sodium.  She is not on any medication for her blood pressure at this time.   GERD-  Notes that she is continuing the zegerid.    Hyperlipidemia- She continues pravastatin- no myalgias.       Review of Systems See HPI  Past Medical History  Diagnosis Date  . Cervical lymphadenopathy     left  . Ulcer 2010  . Hypercholesteremia   . Urinary incontinence   . GERD (gastroesophageal reflux disease)   . Hypertension   . Constipation   . Hyperplastic colonic polyp     history of   . Epigastric pain   . Cervical strain   . Atypical chest pain   . Osteopenia   . Gastric ulcer   . Barrett's esophagus     history of   . Vitamin d deficiency   . Hyperglycemia     a1c 6.3 7/12  . Hyperplastic colon polyp     History   Social History  . Marital Status: Married    Spouse Name: N/A    Number of Children: N/A  . Years of Education: N/A   Occupational History  . Not on file.   Social History Main Topics  . Smoking status: Never Smoker   . Smokeless tobacco: Not on file  . Alcohol Use: No  . Drug Use: No  . Sexually Active: Not on file   Other Topics Concern  . Not on file   Social History Narrative   Reviewed history from 07/04/2009 and no changes required.Married- is guardian for two grandchildren ages 20 and 70.Never SmokedAlcohol use-noRegular exercise-noWorks in housekeeping at [employer]    Past Surgical History  Procedure Date  . Bladder surgery     bladder mesh surgery 2010- Dr. Rhodia Albright  . Colonoscopy w/ polypectomy 2010  . Abdominal hysterectomy 2009    Dr. Bethann Goo    Family History  Problem Relation Age of Onset  . Diabetes Sister   . Colon cancer Father   . Pulmonary embolism Mother     deceased  . Hypertension Brother   .  Hypertension Sister   . Hypertension Father   . Hypertension Mother   . Cancer Paternal Grandmother     oral  . Thyroid disease Sister     1/2 sister thyroid problem  . Healthy Daughter   . Heart attack Brother 12  . Heart attack Father 68    Allergies  Allergen Reactions  . Codeine     REACTION: headache, visual disturbance    Current Outpatient Prescriptions on File Prior to Visit  Medication Sig Dispense Refill  . aspirin 81 MG EC tablet Take 81 mg by mouth daily.        . Calcium Carbonate-Vitamin D (CALCIUM 600+D) 600-400 MG-UNIT per tablet Take 1 tablet by mouth 2 (two) times daily.        Marland Kitchen estradiol (ESTRACE) 0.1 MG/GM vaginal cream Place 2 g vaginally. Two times a week.         BP 102/78  Pulse 88  Temp(Src) 98.3 F (36.8 C) (Oral)  Resp 16  Ht 5' (1.524 m)  Wt 150 lb (68.04 kg)  BMI 29.30 kg/m2  SpO2 99%  Objective:   Physical Exam  Constitutional: She appears well-developed and well-nourished. No distress.  Neck: Normal range of motion. Neck supple.  Cardiovascular: Normal rate and regular rhythm.   No murmur heard. Pulmonary/Chest: Effort normal and breath sounds normal. No respiratory distress. She has no wheezes. She has no rales. She exhibits no tenderness.  Musculoskeletal: She exhibits no edema.  Skin: Skin is dry.  Psychiatric: She has a normal mood and affect. Her behavior is normal. Judgment and thought content normal.          Assessment & Plan:

## 2011-04-09 NOTE — Patient Instructions (Addendum)
Please follow up in 6 months.

## 2011-04-23 ENCOUNTER — Other Ambulatory Visit: Payer: Self-pay | Admitting: Family

## 2011-04-23 NOTE — Telephone Encounter (Signed)
Spoke with Berna Spare at [facility] and asked her to transfer zegerid refills from [facility]. Current request denied as she will contact Kristopher Oppenheim.

## 2011-07-23 ENCOUNTER — Other Ambulatory Visit: Payer: Self-pay | Admitting: Family

## 2011-07-24 ENCOUNTER — Ambulatory Visit (INDEPENDENT_AMBULATORY_CARE_PROVIDER_SITE_OTHER): Payer: 59 | Admitting: Family

## 2011-07-24 ENCOUNTER — Encounter: Payer: Self-pay | Admitting: Family

## 2011-07-24 VITALS — BP 118/80 | HR 110 | Temp 98.8°F | Resp 16 | Wt 150.0 lb

## 2011-07-24 DIAGNOSIS — R0789 Other chest pain: Secondary | ICD-10-CM

## 2011-07-24 DIAGNOSIS — J029 Acute pharyngitis, unspecified: Secondary | ICD-10-CM | POA: Insufficient documentation

## 2011-07-24 DIAGNOSIS — R079 Chest pain, unspecified: Secondary | ICD-10-CM

## 2011-07-24 LAB — POCT RAPID STREP A (OFFICE): Rapid Strep A Screen: NEGATIVE

## 2011-07-24 MED ORDER — ONDANSETRON HCL 4 MG PO TABS: 4.0000 mg | ORAL_TABLET | Freq: Three times a day (TID) | ORAL | Status: AC | PRN

## 2011-07-24 MED ORDER — AMOXICILLIN-POT CLAVULANATE 875-125 MG PO TABS: 1.0000 | ORAL_TABLET | Freq: Two times a day (BID) | ORAL | Status: AC

## 2011-07-24 NOTE — Assessment & Plan Note (Signed)
She had neg stress test 12/12/10.  EKG today notes sinus tach.  Reviewed EKG with cardiology (Dr. Gerrit Halls) who does not believe that EKG is flutter- reads as sinus tach.  I think pt is mildly dehydrated.  Rx with zofran prn N/V, hydration.

## 2011-07-24 NOTE — Progress Notes (Signed)
Subjective:    Patient ID: Whitney Neal, female    DOB: 17-Apr-1960, 51 y.o.   MRN: [id number]  HPI  Whitney Neal presents today with several complaints.  Her chief complaint is sore throat.  She reports that this started  2 weeks ago.  She then developed chest pain, and headache.   Today she develops pain on right side of throat.  Gargled with salt water.  Helped a little.  Reports that she felt very weak this AM.  +Vomitting.  No diarrhea.  No fever.  No ear pain.  Chest pain is substernal- seemed to worsen after vomitting.   Headache is located above the left eye.      Review of Systems See HPI Past Medical History  Diagnosis Date  . Cervical lymphadenopathy     left  . Ulcer 2010  . Hypercholesteremia   . Urinary incontinence   . GERD (gastroesophageal reflux disease)   . Hypertension   . Constipation   . Hyperplastic colonic polyp     history of   . Epigastric pain   . Cervical strain   . Atypical chest pain   . Osteopenia   . Gastric ulcer   . Barrett's esophagus     history of   . Vitamin d deficiency   . Hyperglycemia     a1c 6.3 7/12  . Hyperplastic colon polyp     History   Social History  . Marital Status: Married    Spouse Name: N/A    Number of Children: N/A  . Years of Education: N/A   Occupational History  . Not on file.   Social History Main Topics  . Smoking status: Never Smoker   . Smokeless tobacco: Not on file  . Alcohol Use: No  . Drug Use: No  . Sexually Active: Not on file   Other Topics Concern  . Not on file   Social History Narrative   Reviewed history from 07/04/2009 and no changes required.Married- is guardian for two grandchildren ages 61 and 41.Never SmokedAlcohol use-noRegular exercise-noWorks in housekeeping at [employer]    Past Surgical History  Procedure Date  . Bladder surgery     bladder mesh surgery 2010- Dr. Rhodia Albright  . Colonoscopy w/ polypectomy 2010  . Abdominal hysterectomy 2009    Dr. Bethann Goo     Family History  Problem Relation Age of Onset  . Diabetes Sister   . Colon cancer Father   . Pulmonary embolism Mother     deceased  . Hypertension Brother   . Hypertension Sister   . Hypertension Father   . Hypertension Mother   . Cancer Paternal Grandmother     oral  . Thyroid disease Sister     1/2 sister thyroid problem  . Healthy Daughter   . Heart attack Brother 72  . Heart attack Father 95    Allergies  Allergen Reactions  . Codeine     REACTION: headache, visual disturbance    Current Outpatient Prescriptions on File Prior to Visit  Medication Sig Dispense Refill  . aspirin 81 MG EC tablet Take 81 mg by mouth daily.        . Calcium Carbonate-Vitamin D (CALCIUM 600+D) 600-400 MG-UNIT per tablet Take 1 tablet by mouth 2 (two) times daily.        Marland Kitchen estradiol (ESTRACE) 0.1 MG/GM vaginal cream Place 2 g vaginally. Two times a week.       Marland Kitchen omeprazole-sodium bicarbonate (ZEGERID) 40-1100 MG per capsule  Take 1 capsule by mouth 2 (two) times daily.  30 capsule  5  . pravastatin (PRAVACHOL) 40 MG tablet TAKE 1 TABLET BY MOUTH AT BEDTIME  90 tablet  1  . DISCONTD: omeprazole-sodium bicarbonate (ZEGERID) 40-1100 MG per capsule Take 1 capsule by mouth 2 (two) times daily before a meal.  60 capsule  2  . DISCONTD: omeprazole-sodium bicarbonate (ZEGERID) 40-1100 MG per capsule TAKE 1 CAPSULE BY MOUTH TWICE DAILY  60 capsule  3    BP 118/80  Pulse 110  Temp(Src) 98.8 F (37.1 C) (Oral)  Resp 16  Wt 150 lb 0.6 oz (68.058 kg)  SpO2 98%       Objective:   Physical Exam  Constitutional: She is oriented to person, place, and time. She appears well-developed and well-nourished.       Tired appearing female.   HENT:  Mouth/Throat: Posterior oropharyngeal erythema present. No oropharyngeal exudate or posterior oropharyngeal edema.  Cardiovascular: Normal rate and regular rhythm.   No murmur heard. Pulmonary/Chest: Effort normal and breath sounds normal. No respiratory  distress. She has no wheezes. She has no rales. She exhibits no tenderness.  Musculoskeletal: She exhibits no edema.  Lymphadenopathy:    She has cervical adenopathy.  Neurological: She is alert and oriented to person, place, and time.  Skin: Skin is warm and dry. No rash noted.  Psychiatric: She has a normal mood and affect. Her behavior is normal. Judgment and thought content normal.          Assessment & Plan:

## 2011-07-24 NOTE — Patient Instructions (Signed)
Please drink lots of water today. Go to the ER if you are unable to keep down food/liquid/medicine. Call if fever or if sore throat, nausea does not improve.

## 2011-07-24 NOTE — Assessment & Plan Note (Signed)
Rapid strep neg, but will plan empiric rx with augmentin due to duration, cervical LAD and erythema.

## 2011-07-27 ENCOUNTER — Telehealth: Payer: Self-pay | Admitting: *Deleted

## 2011-07-27 NOTE — Telephone Encounter (Signed)
Received FMLA paperwork from pt as renewal from previous forms last July due to chest pain/heart rate. Forms initiated and forwarded to Provider for completion/signature.

## 2011-07-31 NOTE — Telephone Encounter (Signed)
Form completed.

## 2011-07-31 NOTE — Telephone Encounter (Signed)
Forms copied and sent for scanning. Original placed at front desk for pt to pick up and message left on home # to return my call.

## 2011-08-01 NOTE — Telephone Encounter (Signed)
Patient returned phone call. Informed her that forms were copied and sent for scanning and original was ready at front dest to be picked up.

## 2011-08-21 DIAGNOSIS — N952 Postmenopausal atrophic vaginitis: Secondary | ICD-10-CM | POA: Insufficient documentation

## 2011-10-01 ENCOUNTER — Ambulatory Visit (HOSPITAL_BASED_OUTPATIENT_CLINIC_OR_DEPARTMENT_OTHER)
Admission: RE | Admit: 2011-10-01 | Discharge: 2011-10-01 | Disposition: A | Discharge status: Home / Self Care | Payer: 59 | Source: Ambulatory Visit | Attending: Family | Admitting: Family

## 2011-10-01 ENCOUNTER — Other Ambulatory Visit: Payer: Self-pay | Admitting: Family

## 2011-10-01 ENCOUNTER — Ambulatory Visit (INDEPENDENT_AMBULATORY_CARE_PROVIDER_SITE_OTHER): Payer: 59 | Admitting: Family

## 2011-10-01 ENCOUNTER — Encounter: Payer: Self-pay | Admitting: Family

## 2011-10-01 VITALS — BP 130/90 | HR 98 | Temp 98.0°F | Resp 16 | Ht 60.0 in | Wt 153.1 lb

## 2011-10-01 DIAGNOSIS — R51 Headache: Secondary | ICD-10-CM

## 2011-10-01 DIAGNOSIS — Z1231 Encounter for screening mammogram for malignant neoplasm of breast: Secondary | ICD-10-CM

## 2011-10-01 DIAGNOSIS — R739 Hyperglycemia, unspecified: Secondary | ICD-10-CM

## 2011-10-01 DIAGNOSIS — E785 Hyperlipidemia, unspecified: Secondary | ICD-10-CM

## 2011-10-01 DIAGNOSIS — I10 Essential (primary) hypertension: Secondary | ICD-10-CM

## 2011-10-01 DIAGNOSIS — R7309 Other abnormal glucose: Secondary | ICD-10-CM

## 2011-10-01 DIAGNOSIS — G43909 Migraine, unspecified, not intractable, without status migrainosus: Secondary | ICD-10-CM | POA: Insufficient documentation

## 2011-10-01 LAB — LIPID PANEL
Cholesterol: 179 mg/dL (ref 0–200)
HDL: 50 mg/dL (ref >39)
LDL Cholesterol: 98 mg/dL (ref 0–99)
Total CHOL/HDL Ratio: 3.6 Ratio
Triglycerides: 155 mg/dL — ABNORMAL HIGH (ref <150)
VLDL: 31 mg/dL (ref 0–40)

## 2011-10-01 LAB — BASIC METABOLIC PANEL WITH GFR
BUN: 18 mg/dL (ref 6–23)
CO2: 27 mEq/L (ref 19–32)
Calcium: 9.4 mg/dL (ref 8.4–10.5)
Chloride: 104 mEq/L (ref 96–112)
Creat: 0.65 mg/dL (ref 0.50–1.10)
GFR, Est African American: >89 mL/min
GFR, Est Non African American: >89 mL/min
Glucose, Bld: 100 mg/dL — ABNORMAL HIGH (ref 70–99)
Potassium: 4.7 mEq/L (ref 3.5–5.3)
Sodium: 142 mEq/L (ref 135–145)

## 2011-10-01 LAB — HEPATIC FUNCTION PANEL
ALT: 15 U/L (ref 0–35)
AST: 16 U/L (ref 0–37)
Albumin: 4.1 g/dL (ref 3.5–5.2)
Alkaline Phosphatase: 77 U/L (ref 39–117)
Bilirubin, Direct: 0.1 mg/dL (ref 0.0–0.3)
Indirect Bilirubin: 0.3 mg/dL (ref 0.0–0.9)
Total Bilirubin: 0.4 mg/dL (ref 0.3–1.2)
Total Protein: 6.9 g/dL (ref 6.0–8.3)

## 2011-10-01 LAB — HEMOGLOBIN A1C
Hgb A1c MFr Bld: 6.2 % — ABNORMAL HIGH (ref <5.7)
Mean Plasma Glucose: 131 mg/dL — ABNORMAL HIGH (ref <117)

## 2011-10-01 NOTE — Progress Notes (Signed)
Subjective:    Patient ID: Whitney Neal, female    DOB: [birth year], 50 y.o.   MRN: [id number]  HPI  [name] is a 51 yr old female who presents today for follow up.  1) HTN-  Reports that her DBP tends to run around 90.  Limits sodium.  2) Hyperlipidemia- pt is fasting today, she continues pravastatin. Denies myalgias.  3) Hyperglycemia-  Denies polyuria/polydipsia  4) Headaches-  Reports intermittent.  Occasionally associated with nausea.Takes tylenol and dimetapp. Headaches resolve with use of tylenol.    Review of Systems See HPI  Past Medical History  Diagnosis Date  . Cervical lymphadenopathy     left  . Ulcer 2010  . Hypercholesteremia   . Urinary incontinence   . GERD (gastroesophageal reflux disease)   . Hypertension   . Constipation   . Hyperplastic colonic polyp     history of   . Epigastric pain   . Cervical strain   . Atypical chest pain   . Osteopenia   . Gastric ulcer   . Barrett's esophagus     history of   . Vitamin d deficiency   . Hyperglycemia     a1c 6.3 7/12  . Hyperplastic colon polyp     History   Social History  . Marital Status: Married    Spouse Name: N/A    Number of Children: N/A  . Years of Education: N/A   Occupational History  . Not on file.   Social History Main Topics  . Smoking status: Never Smoker   . Smokeless tobacco: Not on file  . Alcohol Use: No  . Drug Use: No  . Sexually Active: Not on file   Other Topics Concern  . Not on file   Social History Narrative   Reviewed history from 07/04/2009 and no changes required.Married- is guardian for two grandchildren ages 86 and 67.Never SmokedAlcohol use-noRegular exercise-noWorks in housekeeping at [employer]    Past Surgical History  Procedure Date  . Bladder surgery     bladder mesh surgery 2010- Dr. Noland Fordyce  . Colonoscopy w/ polypectomy 2010  . Abdominal hysterectomy 2009    Dr. Ferdinand Cava    Family History  Problem Relation Age of Onset  .  Diabetes Sister   . Colon cancer Father   . Pulmonary embolism Mother     deceased  . Hypertension Brother   . Hypertension Sister   . Hypertension Father   . Hypertension Mother   . Cancer Paternal Grandmother     oral  . Thyroid disease Sister     1/2 sister thyroid problem  . Healthy Daughter   . Heart attack Brother 42  . Heart attack Father 58    Allergies  Allergen Reactions  . Codeine     REACTION: headache, visual disturbance    Current Outpatient Prescriptions on File Prior to Visit  Medication Sig Dispense Refill  . aspirin 81 MG EC tablet Take 81 mg by mouth daily.        . Calcium Carbonate-Vitamin D (CALCIUM 600+D) 600-400 MG-UNIT per tablet Take 1 tablet by mouth 2 (two) times daily.        Marland Kitchen estradiol (ESTRACE) 0.1 MG/GM vaginal cream Place 2 g vaginally. Two times a week.       Marland Kitchen omeprazole-sodium bicarbonate (ZEGERID) 40-1100 MG per capsule Take 1 capsule by mouth 2 (two) times daily.  30 capsule  5  . pravastatin (PRAVACHOL) 40 MG tablet TAKE 1 TABLET  BY MOUTH AT BEDTIME  90 tablet  1    BP 130/90  Pulse 98  Temp 98 F (36.7 C) (Oral)  Resp 16  Ht 5' (1.524 m)  Wt 153 lb 1.9 oz (69.455 kg)  BMI 29.90 kg/m2  SpO2 98%       Objective:   Physical Exam  Constitutional: She appears well-developed and well-nourished. No distress.  Cardiovascular: Normal rate and regular rhythm.   No murmur heard. Pulmonary/Chest: Effort normal and breath sounds normal. No respiratory distress. She has no wheezes. She has no rales. She exhibits no tenderness.  Musculoskeletal: She exhibits no edema.  Psychiatric: She has a normal mood and affect. Her behavior is normal. Judgment and thought content normal.          Assessment & Plan:

## 2011-10-01 NOTE — Assessment & Plan Note (Signed)
Could be migraines, but seem well controlled with PRN tylenol.  Monitor for now.

## 2011-10-01 NOTE — Assessment & Plan Note (Signed)
Tolerating statin, obtain FLP/LFT.   

## 2011-10-01 NOTE — Assessment & Plan Note (Signed)
BP is borderline today.  Will monitor.  Continue exercise and low sodium diet.  We discussed limiting calories to 1200 a day to aid in weight loss- recommended calorie counting app for her phone.

## 2011-10-01 NOTE — Patient Instructions (Addendum)
Try counting calories and keeping your calories to 1200 a day.  Try a calorie app for your phone- https://peterson.info/. Continue regular exercise. Complete lab work prior to leaving.  Please schedule a follow up appointment in 3 months.

## 2011-10-01 NOTE — Assessment & Plan Note (Signed)
Obtain A1C. BMET 

## 2011-10-02 ENCOUNTER — Telehealth: Payer: Self-pay | Admitting: Family

## 2011-10-02 NOTE — Telephone Encounter (Signed)
Pls call pt and let her know that total cholesterol looks good.  Triglycerides slightly elevated.  She should avoid concentrated sweets (juices), white fluffy carbs and continue regular exercise. A1C is 6.2 which is stable.  Liver/kidney function normal.

## 2011-10-02 NOTE — Telephone Encounter (Signed)
Notified pt. 

## 2011-10-02 NOTE — Telephone Encounter (Signed)
Left message on cell# to return my call. 

## 2011-11-12 ENCOUNTER — Encounter: Payer: Self-pay | Admitting: Family

## 2011-11-12 ENCOUNTER — Ambulatory Visit (INDEPENDENT_AMBULATORY_CARE_PROVIDER_SITE_OTHER): Payer: 59 | Admitting: Family

## 2011-11-12 VITALS — BP 106/78 | HR 76 | Temp 98.1°F | Resp 16 | Wt 150.0 lb

## 2011-11-12 DIAGNOSIS — R202 Paresthesia of skin: Secondary | ICD-10-CM

## 2011-11-12 DIAGNOSIS — R209 Unspecified disturbances of skin sensation: Secondary | ICD-10-CM

## 2011-11-12 DIAGNOSIS — M949 Disorder of cartilage, unspecified: Secondary | ICD-10-CM

## 2011-11-12 DIAGNOSIS — M899 Disorder of bone, unspecified: Secondary | ICD-10-CM

## 2011-11-12 DIAGNOSIS — S139XXA Sprain of joints and ligaments of unspecified parts of neck, initial encounter: Secondary | ICD-10-CM

## 2011-11-12 DIAGNOSIS — M8580 Other specified disorders of bone density and structure, unspecified site: Secondary | ICD-10-CM

## 2011-11-12 LAB — VITAMIN B12: Vitamin B-12: 426 pg/mL (ref 211–911)

## 2011-11-12 LAB — FOLATE: Folate: 10.5 ng/mL

## 2011-11-12 MED ORDER — CYCLOBENZAPRINE HCL 5 MG PO TABS
5.0000 mg | ORAL_TABLET | Freq: Three times a day (TID) | ORAL | Status: DC | PRN
Start: 2011-11-12 — End: 2016-02-08

## 2011-11-12 NOTE — Progress Notes (Signed)
Subjective:    Patient ID: Whitney Neal, female    DOB: 1960-08-06, 51 y.o.   MRN: [id number]  HPI  Ms Whitney Neal is a 51 yr old female who presents today with several concerns.    Paresthesia- Pt reports bilateral arm tingling and hand tingling.  Also has some tingling right side of face.  Started Monday.  Pain in the base of the neck. She attributes this to using the computer at work Using [employer] and ibuprofen with some improvement.  + associated dizziness/headache.    Nausea started on Friday.  She reports that she continues to take zegerid.    Review of Systems See HPI  Past Medical History  Diagnosis Date  . Cervical lymphadenopathy     left  . Ulcer 2010  . Hypercholesteremia   . Urinary incontinence   . GERD (gastroesophageal reflux disease)   . Hypertension   . Constipation   . Hyperplastic colonic polyp     history of   . Epigastric pain   . Cervical strain   . Atypical chest pain   . Osteopenia   . Gastric ulcer   . Barrett's esophagus     history of   . Vitamin d deficiency   . Hyperglycemia     a1c 6.3 7/12  . Hyperplastic colon polyp     History   Social History  . Marital Status: Married    Spouse Name: N/A    Number of Children: N/A  . Years of Education: N/A   Occupational History  . Not on file.   Social History Main Topics  . Smoking status: Never Smoker   . Smokeless tobacco: Not on file  . Alcohol Use: No  . Drug Use: No  . Sexually Active: Not on file   Other Topics Concern  . Not on file   Social History Narrative   Reviewed history from 07/04/2009 and no changes required.Married- is guardian for two grandchildren ages 53 and 3.Never SmokedAlcohol use-noRegular exercise-noWorks in housekeeping at [employer]    Past Surgical History  Procedure Date  . Bladder surgery     bladder mesh surgery 2010- Dr. Noland Fordyce  . Colonoscopy w/ polypectomy 2010  . Abdominal hysterectomy 2009    Dr. Ferdinand Cava    Family History  Problem  Relation Age of Onset  . Diabetes Sister   . Colon cancer Father   . Pulmonary embolism Mother     deceased  . Hypertension Brother   . Hypertension Sister   . Hypertension Father   . Hypertension Mother   . Cancer Paternal Grandmother     oral  . Thyroid disease Sister     1/2 sister thyroid problem  . Healthy Daughter   . Heart attack Brother 42  . Heart attack Father 76    Allergies  Allergen Reactions  . Codeine     REACTION: headache, visual disturbance    Current Outpatient Prescriptions on File Prior to Visit  Medication Sig Dispense Refill  . aspirin 81 MG EC tablet Take 81 mg by mouth daily.        . Calcium Carbonate-Vitamin D (CALCIUM 600+D) 600-400 MG-UNIT per tablet Take 1 tablet by mouth 2 (two) times daily.        Marland Kitchen estradiol (ESTRACE) 0.1 MG/GM vaginal cream Place 2 g vaginally. Two times a week.       Marland Kitchen omeprazole-sodium bicarbonate (ZEGERID) 40-1100 MG per capsule Take 1 capsule by mouth 2 (two) times daily.  30 capsule  5  . pravastatin (PRAVACHOL) 40 MG tablet TAKE 1 TABLET BY MOUTH AT BEDTIME  90 tablet  1    BP 106/78  Pulse 76  Temp 98.1 F (36.7 C) (Oral)  Resp 16  Wt 150 lb (68.04 kg)  SpO2 99%        Objective:   Physical Exam  Constitutional: She is oriented to person, place, and time. She appears well-developed and well-nourished. No distress.  Cardiovascular: Normal rate and regular rhythm.   No murmur heard. Pulmonary/Chest: Effort normal and breath sounds normal. No respiratory distress. She has no wheezes. She has no rales. She exhibits no tenderness.  Neurological: She is alert and oriented to person, place, and time.       Bilateral UE/LE strength is 5/5.   Sensation intact to light touch bilateral face, arms, legs.          Assessment & Plan:

## 2011-11-12 NOTE — Assessment & Plan Note (Signed)
Trial of prn flexeril.  She understands that this may cause drowsiness and she should not drive after taking.  I advised her to stop ibuprofen as this may be contributing to her nausea.  Instead, recommended tylenol.

## 2011-11-12 NOTE — Patient Instructions (Addendum)
Please complete your blood work prior to leaving.  Call if symptoms worsen or if no improvement in 1 week.   Please schedule a follow up appointment in 3 months.

## 2011-11-12 NOTE — Assessment & Plan Note (Signed)
Will check B12/folate levels.  Neurologically intact.

## 2011-11-13 ENCOUNTER — Encounter: Payer: Self-pay | Admitting: Family

## 2011-11-22 ENCOUNTER — Ambulatory Visit (HOSPITAL_COMMUNITY)
Admission: RE | Admit: 2011-11-22 | Discharge: 2011-11-22 | Disposition: A | Discharge status: Home / Self Care | Payer: 59 | Source: Ambulatory Visit | Attending: Family | Admitting: Family

## 2011-11-22 ENCOUNTER — Inpatient Hospital Stay: Admission: RE | Admit: 2011-11-22 | Payer: 59 | Source: Ambulatory Visit

## 2011-11-22 DIAGNOSIS — M949 Disorder of cartilage, unspecified: Secondary | ICD-10-CM | POA: Insufficient documentation

## 2011-11-22 DIAGNOSIS — M899 Disorder of bone, unspecified: Secondary | ICD-10-CM | POA: Insufficient documentation

## 2011-11-22 DIAGNOSIS — Z13820 Encounter for screening for osteoporosis: Secondary | ICD-10-CM | POA: Insufficient documentation

## 2011-11-22 DIAGNOSIS — M8580 Other specified disorders of bone density and structure, unspecified site: Secondary | ICD-10-CM

## 2011-11-22 LAB — HM DEXA SCAN

## 2011-11-30 ENCOUNTER — Encounter: Payer: Self-pay | Admitting: Family

## 2011-12-04 ENCOUNTER — Telehealth: Payer: Self-pay | Admitting: Family

## 2011-12-04 DIAGNOSIS — E559 Vitamin D deficiency, unspecified: Secondary | ICD-10-CM

## 2011-12-04 NOTE — Addendum Note (Signed)
Addended by: Mervin Kung A on: 12/04/2011 03:17 PM   Modules accepted: Orders

## 2011-12-04 NOTE — Telephone Encounter (Signed)
Message copied by Kathi Simpers on Tue Dec 04, 2011  2:30 PM ------      Message from: O'SULLIVAN, MELISSA      Created: Thu Nov 22, 2011  9:32 PM       Pls let pt know that bone density shows osteopenia (mild bone thinning) which was also noted on her last bone density.  I would like her to return to the lab please for a vitamin D level at her convenience (vitamin D deficiency). She should continue exercise and caltrate.

## 2011-12-04 NOTE — Telephone Encounter (Signed)
Please call pt and let her know that her bone density again shows osteopenia (mild bone thinning).  It is similar to the results from 2009.  She should continue caltrate + D bid, regular exercise and plan to repeat bone density in 2 years.

## 2011-12-04 NOTE — Telephone Encounter (Signed)
Pt presented to the lab, future order released and given to the lab. 

## 2011-12-04 NOTE — Telephone Encounter (Signed)
Notified pt. She will return to the lab today. Future order entered and given to the lab. Copy of DEXA placed at the front desk for pick up per pt's request.

## 2011-12-05 ENCOUNTER — Telehealth: Payer: Self-pay | Admitting: Family

## 2011-12-05 DIAGNOSIS — E559 Vitamin D deficiency, unspecified: Secondary | ICD-10-CM

## 2011-12-05 LAB — VITAMIN D 25 HYDROXY (VIT D DEFICIENCY, FRACTURES): Vit D, 25-Hydroxy: 17 ng/mL — ABNORMAL LOW (ref 30–89)

## 2011-12-05 MED ORDER — ERGOCALCIFEROL 1.25 MG (50000 UT) PO CAPS: 50000.0000 [IU] | ORAL_CAPSULE | ORAL | Status: DC

## 2011-12-05 NOTE — Telephone Encounter (Signed)
Left message for pt to return my call.

## 2011-12-05 NOTE — Telephone Encounter (Signed)
Please call pt and let her know vitamin D is low. She should start once weekly supplement x 12 weeks, then repeat vitamin D (dx vit D deficiency).

## 2011-12-07 NOTE — Telephone Encounter (Signed)
Notified pt and she voices understanding. Future order entered for the week of 02/29/12 and given to the lab.

## 2011-12-07 NOTE — Telephone Encounter (Signed)
Pt states home today. Please return call.

## 2011-12-31 ENCOUNTER — Ambulatory Visit: Payer: 59 | Admitting: Family

## 2012-01-10 ENCOUNTER — Other Ambulatory Visit: Payer: Self-pay | Admitting: Family

## 2012-01-25 ENCOUNTER — Other Ambulatory Visit: Payer: Self-pay | Admitting: Family

## 2012-01-25 NOTE — Telephone Encounter (Signed)
Rx to pharmacy/SLS 

## 2012-02-06 ENCOUNTER — Ambulatory Visit: Payer: 59 | Admitting: Internal Medicine

## 2012-02-08 ENCOUNTER — Encounter: Payer: Self-pay | Admitting: Family

## 2012-02-08 ENCOUNTER — Ambulatory Visit (INDEPENDENT_AMBULATORY_CARE_PROVIDER_SITE_OTHER): Payer: 59 | Admitting: Family

## 2012-02-08 VITALS — BP 120/84 | HR 81 | Temp 98.4°F | Resp 16 | Ht 60.0 in | Wt 152.0 lb

## 2012-02-08 DIAGNOSIS — R739 Hyperglycemia, unspecified: Secondary | ICD-10-CM

## 2012-02-08 DIAGNOSIS — E785 Hyperlipidemia, unspecified: Secondary | ICD-10-CM

## 2012-02-08 DIAGNOSIS — I10 Essential (primary) hypertension: Secondary | ICD-10-CM

## 2012-02-08 DIAGNOSIS — R7309 Other abnormal glucose: Secondary | ICD-10-CM

## 2012-02-08 DIAGNOSIS — Z8719 Personal history of other diseases of the digestive system: Secondary | ICD-10-CM

## 2012-02-08 DIAGNOSIS — E559 Vitamin D deficiency, unspecified: Secondary | ICD-10-CM

## 2012-02-08 DIAGNOSIS — K2270 Barrett's esophagus without dysplasia: Secondary | ICD-10-CM

## 2012-02-08 LAB — HEMOGLOBIN A1C
Hgb A1c MFr Bld: 5.9 % — ABNORMAL HIGH (ref <5.7)
Mean Plasma Glucose: 123 mg/dL — ABNORMAL HIGH (ref <117)

## 2012-02-08 NOTE — Patient Instructions (Addendum)
Please complete your lab work prior to leaving.  Please follow up in 3 months.  You will be contacted about your referral to Dr. Loreta Ave  Please let us know if you have not heard back within 1 week about your referral.

## 2012-02-08 NOTE — Assessment & Plan Note (Signed)
Continues weekly supplement.  Obtain Vit D.

## 2012-02-08 NOTE — Progress Notes (Signed)
Subjective:    Patient ID: Whitney Neal, female    DOB: 11-25-1960, 51 y.o.   MRN: [id number]  HPI  Whitney Neal is a 51 yr old female who presents today for follow up.  1) HTN- She is not currently on BP meds.     2) Hyperlipidemia- she continues statin without myalgias.    3) Hyperglycemia- last A1C was 6.2  4) GERD/Barrett's Esophagus- Not epigastric burning which is relieved by eating every 2 hours.      Review of Systems    see HPI  Past Medical History  Diagnosis Date  . Cervical lymphadenopathy     left  . Ulcer 2010  . Hypercholesteremia   . Urinary incontinence   . GERD (gastroesophageal reflux disease)   . Hypertension   . Constipation   . Hyperplastic colonic polyp     history of   . Epigastric pain   . Cervical strain   . Atypical chest pain   . Osteopenia   . Gastric ulcer   . Barrett's esophagus     history of   . Vitamin D deficiency   . Hyperglycemia     a1c 6.3 7/12  . Hyperplastic colon polyp     History   Social History  . Marital Status: Married    Spouse Name: N/A    Number of Children: N/A  . Years of Education: N/A   Occupational History  . Not on file.   Social History Main Topics  . Smoking status: Never Smoker   . Smokeless tobacco: Not on file  . Alcohol Use: No  . Drug Use: No  . Sexually Active: Not on file   Other Topics Concern  . 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 Bladder surgery     bladder mesh surgery 2010- Dr. Noland Fordyce  . Colonoscopy w/ polypectomy 2010  . Abdominal hysterectomy 2009    Dr. Ferdinand Cava    Family History  Problem Relation Age of Onset  . Diabetes Sister   . Colon cancer Father   . Pulmonary embolism Mother     deceased  . Hypertension  Brother   . Hypertension Sister   . Hypertension Father   . Hypertension Mother   . Cancer Paternal Grandmother     oral  . Thyroid disease Sister     1/2 sister thyroid problem  . Healthy Daughter   . Heart attack Brother 42  . Heart attack Father 58    Allergies  Allergen Reactions  . Codeine     REACTION: headache, visual disturbance    Current Outpatient Prescriptions on File Prior to Visit  Medication Sig Dispense Refill  . aspirin 81 MG EC tablet Take 81 mg by mouth daily.        . Calcium Carbonate-Vitamin D (CALCIUM 600+D) 600-400 MG-UNIT per tablet Take 1 tablet by mouth 2 (two) times daily.        . cyclobenzaprine (FLEXERIL) 5 MG tablet Take 1 tablet (5 mg total) by mouth 3 (three) times daily as needed for muscle spasms.  20 tablet  0  . ergocalciferol (VITAMIN D2) 50000 UNITS capsule Take 1 capsule (50,000 Units total) by mouth once a week.  12 capsule  0  . estradiol (ESTRACE) 0.1 MG/GM  vaginal cream Place 2 g vaginally. Two times a week.       Marland Kitchen omeprazole-sodium bicarbonate (ZEGERID) 40-1100 MG per capsule Take 1 capsule by mouth 2 (two) times daily.  30 capsule  5  . omeprazole-sodium bicarbonate (ZEGERID) 40-1100 MG per capsule TAKE 1 CAPSULE BY MOUTH TWICE DAILY  60 capsule  3  . pravastatin (PRAVACHOL) 40 MG tablet TAKE 1 TABLET BY MOUTH AT BEDTIME  90 tablet  1    BP 120/84  Pulse 81  Temp 98.4 F (36.9 C) (Oral)  Resp 16  Ht 5' (1.524 m)  Wt 152 lb (68.947 kg)  BMI 29.69 kg/m2  SpO2 97%    Objective:   Physical Exam        Assessment & Plan:

## 2012-02-08 NOTE — Assessment & Plan Note (Signed)
Obtain A1C. 

## 2012-02-09 ENCOUNTER — Telehealth: Payer: Self-pay | Admitting: Family

## 2012-02-09 ENCOUNTER — Encounter: Payer: Self-pay | Admitting: Family

## 2012-02-09 LAB — VITAMIN D 25 HYDROXY (VIT D DEFICIENCY, FRACTURES): Vit D, 25-Hydroxy: 37 ng/mL (ref 30–89)

## 2012-02-09 NOTE — Telephone Encounter (Signed)
Pls let pt know that her sugar appears well controlled and vitamin D is improved. She should complete her weekly vitamin D supplements. When complete, she can switch to otc vitamin D 3000 units PO daily.

## 2012-02-09 NOTE — Assessment & Plan Note (Signed)
LDL at goal, continue statin. 

## 2012-02-09 NOTE — Assessment & Plan Note (Addendum)
Continues to have some intermittent GERD symptoms despite bid Zegerid.  She wishes to establish with a new GI.  Will refer to Dr. Starleen Arms.

## 2012-02-09 NOTE — Assessment & Plan Note (Signed)
BP Readings from Last 3 Encounters:  02/08/12 120/84  11/12/11 106/78  10/01/11 130/90   BP remains stable off of medication.  Monitor.

## 2012-02-11 NOTE — Telephone Encounter (Signed)
Pt unavailable; spouse informed to forward message, understood & agreed/SLS

## 2012-02-15 ENCOUNTER — Encounter: Payer: Self-pay | Admitting: Internal Medicine

## 2012-03-18 ENCOUNTER — Ambulatory Visit: Payer: 59 | Admitting: Family

## 2012-03-18 ENCOUNTER — Telehealth: Payer: Self-pay | Admitting: *Deleted

## 2012-03-18 NOTE — Telephone Encounter (Signed)
Received call from pt stating she is having pain from her throat/neck through her chest. Pain woke her up around 2am this morning and is about 6-7 intensity on a scale of 1-10. Pt denies sob, n/v, or any other symptoms. States she has taken Tylenol but it has not relieved her pain.  Advised pt to be evaluated in the ER. Pt is reluctant to be seen in the ER and wants to wait to be seen in the office. Advised pt per Provider that she should be evaluated in the ER first to rule out underlying cardiac or lung abnormalities. Pt voices understanding.

## 2012-05-12 ENCOUNTER — Ambulatory Visit: Payer: 59 | Admitting: Family

## 2012-05-23 ENCOUNTER — Encounter: Payer: Self-pay | Admitting: Family

## 2012-05-23 ENCOUNTER — Ambulatory Visit (INDEPENDENT_AMBULATORY_CARE_PROVIDER_SITE_OTHER): Payer: 59 | Admitting: Family

## 2012-05-23 VITALS — BP 112/82 | HR 80 | Temp 98.0°F | Resp 16 | Wt 150.0 lb

## 2012-05-23 DIAGNOSIS — E785 Hyperlipidemia, unspecified: Secondary | ICD-10-CM

## 2012-05-23 DIAGNOSIS — R51 Headache: Secondary | ICD-10-CM

## 2012-05-23 DIAGNOSIS — I10 Essential (primary) hypertension: Secondary | ICD-10-CM

## 2012-05-23 DIAGNOSIS — R7309 Other abnormal glucose: Secondary | ICD-10-CM

## 2012-05-23 DIAGNOSIS — R739 Hyperglycemia, unspecified: Secondary | ICD-10-CM

## 2012-05-23 LAB — HEPATIC FUNCTION PANEL
ALT: 11 U/L (ref 0–35)
AST: 16 U/L (ref 0–37)
Albumin: 4.4 g/dL (ref 3.5–5.2)
Alkaline Phosphatase: 75 U/L (ref 39–117)
Bilirubin, Direct: 0.1 mg/dL (ref 0.0–0.3)
Indirect Bilirubin: 0.4 mg/dL (ref 0.0–0.9)
Total Bilirubin: 0.5 mg/dL (ref 0.3–1.2)
Total Protein: 6.9 g/dL (ref 6.0–8.3)

## 2012-05-23 LAB — LIPID PANEL
Cholesterol: 151 mg/dL (ref 0–200)
HDL: 52 mg/dL (ref >39)
LDL Cholesterol: 77 mg/dL (ref 0–99)
Total CHOL/HDL Ratio: 2.9 Ratio
Triglycerides: 109 mg/dL (ref <150)
VLDL: 22 mg/dL (ref 0–40)

## 2012-05-23 LAB — HEMOGLOBIN A1C
Hgb A1c MFr Bld: 6.1 % — ABNORMAL HIGH (ref <5.7)
Mean Plasma Glucose: 128 mg/dL — ABNORMAL HIGH (ref <117)

## 2012-05-23 MED ORDER — AMITRIPTYLINE HCL 25 MG PO TABS: 25.0000 mg | ORAL_TABLET | Freq: Every day | ORAL | Status: DC

## 2012-05-23 MED ORDER — PRAVASTATIN SODIUM 40 MG PO TABS: ORAL_TABLET | ORAL | Status: DC

## 2012-05-23 MED ORDER — SUMATRIPTAN SUCCINATE 50 MG PO TABS: ORAL_TABLET | ORAL | Status: DC

## 2012-05-23 NOTE — Assessment & Plan Note (Signed)
Obtain flp/lft.  

## 2012-05-23 NOTE — Assessment & Plan Note (Signed)
BP stable, continue low sodium diet. 

## 2012-05-23 NOTE — Assessment & Plan Note (Signed)
Symptoms most consistent with migraines at this point. Will give trial of elavil HS for HA prophylaxis and PRN imitrex.  If no improvement in 1 month, will plan referral to HA clinic.

## 2012-05-23 NOTE — Assessment & Plan Note (Signed)
Obtain A1C. 

## 2012-05-23 NOTE — Progress Notes (Signed)
Subjective:    Patient ID: Whitney Neal, female    DOB: [birth year], 52 y.o.   MRN: [id number]  HPI  Whitney Neal is a 52 yr old female who presents today for follow up of multiple medical problems.  1) HTN- currently managed with diet alone.  2) Hyperlipidemia- she is maintained on pravastatin.    3) Hyperglycemia- last A1C back in December was 5.9.  4) Headache- She reports that she has been having headache for a "long time."  Sometimes she wakes with headache.  Reports chronic eye redness.  Occasional sinus pressure.  Reports associated nausea, mild photophobia, + associated phonophobia.    Review of Systems Past Medical History  Diagnosis Date  . Cervical lymphadenopathy     left  . Ulcer 2010  . Hypercholesteremia   . Urinary incontinence   . GERD (gastroesophageal reflux disease)   . Hypertension   . Constipation   . Hyperplastic colonic polyp     history of   . Epigastric pain   . Cervical strain   . Atypical chest pain   . Osteopenia   . Gastric ulcer   . Barrett's esophagus     history of   . Vitamin D deficiency   . Hyperglycemia     a1c 6.3 7/12  . Hyperplastic colon polyp     History   Social History  . Marital Status: Married    Spouse Name: N/A    Number of Children: N/A  . Years of Education: N/A   Occupational History  . Not on file.   Social History Main Topics  . Smoking status: Never Smoker   . Smokeless tobacco: Not on file  . Alcohol Use: No  . Drug Use: No  . Sexually Active: Not on file   Other Topics Concern  . Not on file   Social History Narrative   Reviewed history from 07/04/2009 and no changes required.   Married- is guardian for two grandchildren ages 65 and 65.   Never Smoked   Alcohol use-no   Regular exercise-no   Works in housekeeping at [employer]          Past Surgical History  Procedure Laterality Date  . Bladder surgery      bladder mesh surgery 2010- Dr. Noland Fordyce  . Colonoscopy w/ polypectomy   2010  . Abdominal hysterectomy  2009    Dr. Ferdinand Cava    Family History  Problem Relation Age of Onset  . Diabetes Sister   . Colon cancer Father   . Pulmonary embolism Mother     deceased  . Hypertension Brother   . Hypertension Sister   . Hypertension Father   . Hypertension Mother   . Cancer Paternal Grandmother     oral  . Thyroid disease Sister     1/2 sister thyroid problem  . Healthy Daughter   . Heart attack Brother 42  . Heart attack Father 15    Allergies  Allergen Reactions  . Codeine     REACTION: headache, visual disturbance    Current Outpatient Prescriptions on File Prior to Visit  Medication Sig Dispense Refill  . aspirin 81 MG EC tablet Take 81 mg by mouth daily.        . Calcium Carbonate-Vitamin D (CALCIUM 600+D) 600-400 MG-UNIT per tablet Take 1 tablet by mouth 2 (two) times daily.        . cholecalciferol (VITAMIN D) 1000 UNITS tablet Take 3,000 Units by mouth daily.      Marland Kitchen  cyclobenzaprine (FLEXERIL) 5 MG tablet Take 1 tablet (5 mg total) by mouth 3 (three) times daily as needed for muscle spasms.  20 tablet  0  . estradiol (ESTRACE) 0.1 MG/GM vaginal cream Place 2 g vaginally. Two times a week.       Marland Kitchen omeprazole-sodium bicarbonate (ZEGERID) 40-1100 MG per capsule Take 1 capsule by mouth 2 (two) times daily.  30 capsule  5  . pravastatin (PRAVACHOL) 40 MG tablet TAKE 1 TABLET BY MOUTH AT BEDTIME  90 tablet  1   No current facility-administered medications on file prior to visit.    BP 112/82  Pulse 80  Temp(Src) 98 F (36.7 C) (Oral)  Resp 16  Wt 150 lb 0.6 oz (68.058 kg)  BMI 29.3 kg/m2  SpO2 99%       Objective:   Physical Exam  Constitutional: She appears well-developed and well-nourished. No distress.  HENT:  Head: Normocephalic and atraumatic.  Eyes: Right conjunctiva is not injected. Left conjunctiva is not injected.  Cardiovascular: Normal rate and regular rhythm.   No murmur heard. Pulmonary/Chest: Effort normal and breath sounds  normal. No respiratory distress. She has no wheezes. She has no rales. She exhibits no tenderness.  Lymphadenopathy:    She has no cervical adenopathy.  Skin: Skin is warm and dry.  Psychiatric: She has a normal mood and affect. Her behavior is normal. Judgment and thought content normal.          Assessment & Plan:

## 2012-05-23 NOTE — Patient Instructions (Addendum)
Follow up in 1 month.   Complete lab work prior to leaving.

## 2012-05-26 ENCOUNTER — Encounter: Payer: Self-pay | Admitting: Family

## 2012-05-27 ENCOUNTER — Telehealth: Payer: Self-pay | Admitting: *Deleted

## 2012-05-27 NOTE — Telephone Encounter (Signed)
Pt left message requesting status of FMLA paperwork. States it is due before 05/30/12.  Please advise.

## 2012-05-28 NOTE — Telephone Encounter (Signed)
Form is complete.

## 2012-05-28 NOTE — Telephone Encounter (Signed)
Form placed at front desk for pick up and pt has been notified.

## 2012-06-30 ENCOUNTER — Encounter: Payer: Self-pay | Admitting: Family

## 2012-06-30 ENCOUNTER — Ambulatory Visit (INDEPENDENT_AMBULATORY_CARE_PROVIDER_SITE_OTHER): Payer: 59 | Admitting: Family

## 2012-06-30 VITALS — BP 116/78 | HR 84 | Temp 97.8°F | Resp 16 | Ht 60.0 in | Wt 153.0 lb

## 2012-06-30 DIAGNOSIS — M791 Myalgia, unspecified site: Secondary | ICD-10-CM

## 2012-06-30 DIAGNOSIS — IMO0001 Reserved for inherently not codable concepts without codable children: Secondary | ICD-10-CM

## 2012-06-30 DIAGNOSIS — R51 Headache: Secondary | ICD-10-CM

## 2012-06-30 NOTE — Patient Instructions (Addendum)
Please hold pravastatin x 2 weeks- call me and let me know how your muscle pain is.   Follow up in 3 months, sooner if problems/concerns.

## 2012-06-30 NOTE — Progress Notes (Signed)
Subjective:    Patient ID: Whitney Neal, female    DOB: Sep 17, 1960, 52 y.o.   MRN: [id number]  HPI  Whitney Neal is a 52 yr old female who presents today for follow up of her migraine headaches.  Last visit amitriptyline was added for HA prophylaxis.  Felt like a zombie on amitryptylline. She did not take this at night but took it PRN HA during the day.  In generall she reports some light headaches.  Took imitrex 2 days ago for bad ha. 1st dose didn't help but she did have some help from the second dose.  Reports myalgia- has been having muscle pain x 1 year.  Also reports some joint pain in the hands and feet.      Review of Systems See HPI  Past Medical History  Diagnosis Date  . Cervical lymphadenopathy     left  . Ulcer 2010  . Hypercholesteremia   . Urinary incontinence   . GERD (gastroesophageal reflux disease)   . Hypertension   . Constipation   . Hyperplastic colonic polyp     history of   . Epigastric pain   . Cervical strain   . Atypical chest pain   . Osteopenia   . Gastric ulcer   . Barrett's esophagus     history of   . Vitamin D deficiency   . Hyperglycemia     a1c 6.3 7/12  . Hyperplastic colon polyp     History   Social History  . Marital Status: Married    Spouse Name: Whitney Neal    Number of Children: Whitney Neal  . Years of Education: Whitney Neal   Occupational History  . Not on file.   Social History Main Topics  . Smoking status: Never Smoker   . Smokeless tobacco: Not on file  . Alcohol Use: No  . Drug Use: No  . Sexually Active: Not on file   Other Topics Concern  . Not on file   Social History Narrative   Reviewed history from 07/04/2009 and no changes required.   Married- is guardian for two grandchildren ages Whitney Neal and Whitney Neal.   Never Smoked   Alcohol use-no   Regular exercise-no   Works in housekeeping at [employer]          Past Surgical History  Procedure Laterality Date  . Bladder surgery      bladder mesh surgery 2010- Dr. Noland Fordyce   . Colonoscopy w/ polypectomy  2010  . Abdominal hysterectomy  2009    Dr. Ferdinand Cava    Family History  Problem Relation Age of Onset  . Diabetes Sister   . Colon cancer Father   . Pulmonary embolism Mother     deceased  . Hypertension Brother   . Hypertension Sister   . Hypertension Father   . Hypertension Mother   . Cancer Paternal Grandmother     oral  . Thyroid disease Sister     1/2 sister thyroid problem  . Healthy Daughter   . Heart attack Brother 42  . Heart attack Father 75    Allergies  Allergen Reactions  . Codeine     REACTION: headache, visual disturbance    Current Outpatient Prescriptions on File Prior to Visit  Medication Sig Dispense Refill  . amitriptyline (ELAVIL) 25 MG tablet Take 1 tablet (25 mg total) by mouth at bedtime.  30 tablet  2  . aspirin 81 MG EC tablet Take 81 mg by mouth daily.        Marland Kitchen  Calcium Carbonate-Vitamin D (CALCIUM 600+D) 600-400 MG-UNIT per tablet Take 1 tablet by mouth 2 (two) times daily.        . cholecalciferol (VITAMIN D) 1000 UNITS tablet Take 3,000 Units by mouth daily.      . cyclobenzaprine (FLEXERIL) 5 MG tablet Take 1 tablet (5 mg total) by mouth 3 (three) times daily as needed for muscle spasms.  20 tablet  0  . estradiol (ESTRACE) 0.1 MG/GM vaginal cream Place 2 g vaginally. Two times a week.       . loratadine (CLARITIN) 10 MG tablet Take 10 mg by mouth daily.      Marland Kitchen omeprazole-sodium bicarbonate (ZEGERID) 40-1100 MG per capsule Take 1 capsule by mouth 2 (two) times daily.  30 capsule  5  . pravastatin (PRAVACHOL) 40 MG tablet TAKE 1 TABLET BY MOUTH AT BEDTIME  90 tablet  1  . SUMAtriptan (IMITREX) 50 MG tablet One tab by mouth at start of headache.  May repeat in 2 hours if needed.  Max 2 tabs in 24 hours  10 tablet  2   No current facility-administered medications on file prior to visit.    Ht 5' (1.524 m)       Objective:   Physical Exam  Constitutional: She is oriented to person, place, and time. She appears  well-developed and well-nourished. No distress.  HENT:  Head: Normocephalic.  Cardiovascular: Normal rate and regular rhythm.   No murmur heard. Pulmonary/Chest: Effort normal and breath sounds normal. No respiratory distress. She has no wheezes. She has no rales. She exhibits no tenderness.  Neurological: She is alert and oriented to person, place, and time.  Psychiatric: She has a normal mood and affect. Her behavior is normal. Judgment and thought content normal.          Assessment & Plan:

## 2012-06-30 NOTE — Assessment & Plan Note (Signed)
She reports + myalgia.  I asked pt to hold pravastatin x 2 weeks and contact me to let me know if her myalgias are improved.

## 2012-06-30 NOTE — Assessment & Plan Note (Signed)
Didn't tolerate elavil.  Overall feels that HA's better.  Some relief with PRN imitrex.

## 2012-08-06 ENCOUNTER — Ambulatory Visit: Payer: 59 | Admitting: Family

## 2012-09-03 ENCOUNTER — Other Ambulatory Visit: Payer: Self-pay | Admitting: Family

## 2012-09-03 DIAGNOSIS — Z0000 Encounter for general adult medical examination without abnormal findings: Secondary | ICD-10-CM

## 2012-09-09 ENCOUNTER — Encounter: Payer: Self-pay | Admitting: Family

## 2012-09-09 ENCOUNTER — Ambulatory Visit (INDEPENDENT_AMBULATORY_CARE_PROVIDER_SITE_OTHER): Payer: 59 | Admitting: Family

## 2012-09-09 VITALS — BP 106/80 | HR 84 | Temp 98.3°F | Resp 16 | Wt 152.0 lb

## 2012-09-09 DIAGNOSIS — I10 Essential (primary) hypertension: Secondary | ICD-10-CM

## 2012-09-09 MED ORDER — OMEPRAZOLE-SODIUM BICARBONATE 40-1100 MG PO CAPS: 1.0000 | ORAL_CAPSULE | Freq: Two times a day (BID) | ORAL | Status: DC

## 2012-09-09 NOTE — Progress Notes (Signed)
Subjective:    Patient ID: Whitney Neal, female    DOB: 10-22-1960, 53 y.o.   MRN: [id number]  HPI  Whitney Neal is a 52 yr old female who presents today for follow up of her HTN. Yesterday BP was elevated at her GYN.  Had another high reading when she was at work.   Migraine- will meet with Dr. Vela Prose for management of her migraine.     Review of Systems See HPI  Past Medical History  Diagnosis Date  . Cervical lymphadenopathy     left  . Ulcer 2010  . Hypercholesteremia   . Urinary incontinence   . GERD (gastroesophageal reflux disease)   . Hypertension   . Constipation   . Hyperplastic colonic polyp     history of   . Epigastric pain   . Cervical strain   . Atypical chest pain   . Osteopenia   . Gastric ulcer   . Barrett's esophagus     history of   . Vitamin D deficiency   . Hyperglycemia     a1c 6.3 7/12  . Hyperplastic colon polyp     History   Social History  . Marital Status: Married    Spouse Name: N/A    Number of Children: N/A  . Years of Education: N/A   Occupational History  . Not on file.   Social History Main Topics  . Smoking status: Never Smoker   . Smokeless tobacco: Not on file  . Alcohol Use: No  . Drug Use: No  . Sexually Active: Not on file   Other Topics Concern  . Not on file   Social History Narrative   Reviewed history from 07/04/2009 and no changes required.   Married- is guardian for two grandchildren ages 55 and 11.   Never Smoked   Alcohol use-no   Regular exercise-no   Works in housekeeping at [employer]          Past Surgical History  Procedure Laterality Date  . Bladder surgery      bladder mesh surgery 2010- Dr. Noland Fordyce  . Colonoscopy w/ polypectomy  2010  . Abdominal hysterectomy  2009    Dr. Ferdinand Cava    Family History  Problem Relation Age of Onset  . Diabetes Sister   . Colon cancer Father   . Pulmonary embolism Mother     deceased  . Hypertension Brother   . Hypertension Sister   .  Hypertension Father   . Hypertension Mother   . Cancer Paternal Grandmother     oral  . Thyroid disease Sister     1/2 sister thyroid problem  . Healthy Daughter   . Heart attack Brother 42  . Heart attack Father 39    Allergies  Allergen Reactions  . Codeine     REACTION: headache, visual disturbance    Current Outpatient Prescriptions on File Prior to Visit  Medication Sig Dispense Refill  . amitriptyline (ELAVIL) 25 MG tablet Take 1 tablet (25 mg total) by mouth at bedtime.  30 tablet  2  . aspirin 81 MG EC tablet Take 81 mg by mouth daily.        . Calcium Carbonate-Vitamin D (CALCIUM 600+D) 600-400 MG-UNIT per tablet Take 1 tablet by mouth 2 (two) times daily.        . cholecalciferol (VITAMIN D) 1000 UNITS tablet Take 3,000 Units by mouth daily.      . cyclobenzaprine (FLEXERIL) 5 MG tablet Take 1 tablet (  5 mg total) by mouth 3 (three) times daily as needed for muscle spasms.  20 tablet  0  . estradiol (ESTRACE) 0.1 MG/GM vaginal cream Place 2 g vaginally. Two times a week.       . loratadine (CLARITIN) 10 MG tablet Take 10 mg by mouth daily.      . pravastatin (PRAVACHOL) 40 MG tablet TAKE 1 TABLET BY MOUTH AT BEDTIME  90 tablet  1  . SUMAtriptan (IMITREX) 50 MG tablet One tab by mouth at start of headache.  May repeat in 2 hours if needed.  Max 2 tabs in 24 hours  10 tablet  2   No current facility-administered medications on file prior to visit.    BP 106/80  Pulse 84  Temp(Src) 98.3 F (36.8 C) (Oral)  Resp 16  Wt 152 lb 0.6 oz (68.965 kg)  BMI 29.69 kg/m2  SpO2 99%       Objective:   Physical Exam  Constitutional: She is oriented to person, place, and time. She appears well-developed and well-nourished. No distress.  Cardiovascular: Normal rate and regular rhythm.   No murmur heard. Pulmonary/Chest: Effort normal and breath sounds normal. No respiratory distress. She has no wheezes. She has no rales. She exhibits no tenderness.  Musculoskeletal: She  exhibits no edema.  Neurological: She is alert and oriented to person, place, and time.  Psychiatric: She has a normal mood and affect. Her behavior is normal. Judgment and thought content normal.          Assessment & Plan:

## 2012-09-09 NOTE — Assessment & Plan Note (Signed)
BP looks great today. I have asked her to continue low sodium diet. Check BP once daily x 1 week and then contact us with her readings.    BP Readings from Last 3 Encounters:  09/09/12 106/80  06/30/12 116/78  05/23/12 112/82

## 2012-09-09 NOTE — Patient Instructions (Addendum)
Please check your blood pressure once daily for 1 week. Call us in 1 week with your readings. Continue a low sodium diet.  Follow up in 3 months.

## 2012-10-01 ENCOUNTER — Ambulatory Visit (HOSPITAL_COMMUNITY)
Admission: RE | Admit: 2012-10-01 | Discharge: 2012-10-01 | Disposition: A | Discharge status: Home / Self Care | Payer: 59 | Source: Ambulatory Visit | Attending: Family | Admitting: Family

## 2012-10-01 DIAGNOSIS — Z0000 Encounter for general adult medical examination without abnormal findings: Secondary | ICD-10-CM

## 2012-10-01 DIAGNOSIS — Z1231 Encounter for screening mammogram for malignant neoplasm of breast: Secondary | ICD-10-CM | POA: Insufficient documentation

## 2012-10-06 ENCOUNTER — Ambulatory Visit: Payer: 59 | Admitting: Family

## 2012-12-08 ENCOUNTER — Encounter: Payer: Self-pay | Admitting: Family

## 2012-12-08 ENCOUNTER — Ambulatory Visit (INDEPENDENT_AMBULATORY_CARE_PROVIDER_SITE_OTHER): Payer: 59 | Admitting: Family

## 2012-12-08 VITALS — BP 126/80 | HR 83 | Temp 98.1°F | Resp 16 | Ht 60.0 in | Wt 150.1 lb

## 2012-12-08 DIAGNOSIS — R51 Headache: Secondary | ICD-10-CM

## 2012-12-08 DIAGNOSIS — I10 Essential (primary) hypertension: Secondary | ICD-10-CM

## 2012-12-08 NOTE — Progress Notes (Signed)
Subjective:    Patient ID: Whitney Neal, female    DOB: Apr 16, 1960, 52 y.o.   MRN: [id number]  HPI  [name] is a 52 yr old female who presents today for follow up of her HTN.  Continues low sodium diet. Denies CPSOB/swelling.  Migraines- Reports that she is following with Dr. Vela Prose.  Reports that HA's have decreased to 2 times a week.       Review of Systems See HPI  Past Medical History  Diagnosis Date  . Cervical lymphadenopathy     left  . Ulcer 2010  . Hypercholesteremia   . Urinary incontinence   . GERD (gastroesophageal reflux disease)   . Hypertension   . Constipation   . Hyperplastic colonic polyp     history of   . Epigastric pain   . Cervical strain   . Atypical chest pain   . Osteopenia   . Gastric ulcer   . Barrett's esophagus     history of   . Vitamin D deficiency   . Hyperglycemia     a1c 6.3 7/12  . Hyperplastic colon polyp     History   Social History  . Marital Status: Married    Spouse Name: N/A    Number of Children: N/A  . Years of Education: N/A   Occupational History  . Not on file.   Social History Main Topics  . Smoking status: Never Smoker   . Smokeless tobacco: Not on file  . Alcohol Use: No  . Drug Use: No  . Sexual Activity: Not on file   Other Topics Concern  . Not on file   Social History Narrative   Reviewed history from 07/04/2009 and no changes required.   Married- is guardian for two grandchildren ages 68 and 2.   Never Smoked   Alcohol use-no   Regular exercise-no   Works in housekeeping at [employer]          Past Surgical History  Procedure Laterality Date  . Bladder surgery      bladder mesh surgery 2010- Dr. Noland Fordyce  . Colonoscopy w/ polypectomy  2010  . Abdominal hysterectomy  2009    Dr. Ferdinand Cava    Family History  Problem Relation Age of Onset  . Diabetes Sister   . Colon cancer Father   . Pulmonary embolism Mother     deceased  . Hypertension Brother   . Hypertension Sister    . Hypertension Father   . Hypertension Mother   . Cancer Paternal Grandmother     oral  . Thyroid disease Sister     1/2 sister thyroid problem  . Healthy Daughter   . Heart attack Brother 42  . Heart attack Father 45    Allergies  Allergen Reactions  . Codeine     REACTION: headache, visual disturbance    Current Outpatient Prescriptions on File Prior to Visit  Medication Sig Dispense Refill  . amitriptyline (ELAVIL) 25 MG tablet Take 1 tablet (25 mg total) by mouth at bedtime.  30 tablet  2  . aspirin 81 MG EC tablet Take 81 mg by mouth daily.        . Calcium Carbonate-Vitamin D (CALCIUM 600+D) 600-400 MG-UNIT per tablet Take 1 tablet by mouth 2 (two) times daily.        . cholecalciferol (VITAMIN D) 1000 UNITS tablet Take 3,000 Units by mouth daily.      . cyclobenzaprine (FLEXERIL) 5 MG tablet Take 1 tablet (  5 mg total) by mouth 3 (three) times daily as needed for muscle spasms.  20 tablet  0  . estradiol (ESTRACE) 0.1 MG/GM vaginal cream Place 2 g vaginally. Two times a week.       . loratadine (CLARITIN) 10 MG tablet Take 10 mg by mouth daily.      Marland Kitchen omeprazole-sodium bicarbonate (ZEGERID) 40-1100 MG per capsule Take 1 capsule by mouth 2 (two) times daily.  60 capsule  3  . pravastatin (PRAVACHOL) 40 MG tablet TAKE 1 TABLET BY MOUTH AT BEDTIME  90 tablet  1   No current facility-administered medications on file prior to visit.    BP 126/80  Pulse 83  Temp(Src) 98.1 F (36.7 C) (Oral)  Resp 16  Ht 5' (1.524 m)  Wt 150 lb 1.3 oz (68.076 kg)  BMI 29.31 kg/m2  SpO2 99%       Objective:   Physical Exam  Constitutional: She is oriented to person, place, and time. She appears well-developed and well-nourished. No distress.  HENT:  Head: Normocephalic and atraumatic.  Cardiovascular: Normal rate and regular rhythm.   No murmur heard. Pulmonary/Chest: Effort normal and breath sounds normal. No respiratory distress. She has no wheezes. She has no rales. She exhibits  no tenderness.  Musculoskeletal: She exhibits no edema.  Neurological: She is alert and oriented to person, place, and time.  Psychiatric: She has a normal mood and affect. Her behavior is normal. Judgment and thought content normal.          Assessment & Plan:

## 2012-12-08 NOTE — Assessment & Plan Note (Signed)
BP stable on low sodium diet. Continue same.  Monitor.

## 2012-12-08 NOTE — Assessment & Plan Note (Signed)
Migraines improved.  Management per Neurology.

## 2012-12-08 NOTE — Patient Instructions (Signed)
Please follow up in 6 months, sooner if problems/concerns.  

## 2013-03-31 ENCOUNTER — Telehealth: Payer: Self-pay | Admitting: Internal Medicine

## 2013-03-31 NOTE — Telephone Encounter (Signed)
TRANSFERRED GI CARE TO [facility]

## 2013-05-15 ENCOUNTER — Other Ambulatory Visit: Payer: Self-pay | Admitting: Family

## 2013-05-15 NOTE — Telephone Encounter (Signed)
Pravastatin refill sent to pt's pharmacy.  I see pt's upcoming appt on 06/01/13 is with Dr Charlett Blake but she has not seen dr  Charlett Blake before and I do not see any documentation that pt is requesting to change Providers.  Please r/s appt with PCP (O'sullivan,NP).

## 2013-05-18 NOTE — Telephone Encounter (Signed)
Patient came into office and rescheduled to see Melissa.

## 2013-05-18 NOTE — Telephone Encounter (Signed)
Left message for patient to return my call.

## 2013-06-01 ENCOUNTER — Ambulatory Visit (INDEPENDENT_AMBULATORY_CARE_PROVIDER_SITE_OTHER): Payer: 59 | Admitting: Family

## 2013-06-01 ENCOUNTER — Encounter: Payer: Self-pay | Admitting: Family

## 2013-06-01 ENCOUNTER — Ambulatory Visit: Payer: 59 | Admitting: Family Medicine

## 2013-06-01 VITALS — BP 122/82 | HR 81 | Temp 97.8°F | Resp 16 | Ht 60.0 in | Wt 153.0 lb

## 2013-06-01 DIAGNOSIS — I10 Essential (primary) hypertension: Secondary | ICD-10-CM

## 2013-06-01 DIAGNOSIS — R739 Hyperglycemia, unspecified: Secondary | ICD-10-CM

## 2013-06-01 DIAGNOSIS — G43909 Migraine, unspecified, not intractable, without status migrainosus: Secondary | ICD-10-CM

## 2013-06-01 DIAGNOSIS — R7309 Other abnormal glucose: Secondary | ICD-10-CM

## 2013-06-01 DIAGNOSIS — E785 Hyperlipidemia, unspecified: Secondary | ICD-10-CM

## 2013-06-01 LAB — BASIC METABOLIC PANEL WITH GFR
BUN: 16 mg/dL (ref 6–23)
CO2: 25 mEq/L (ref 19–32)
Calcium: 9.2 mg/dL (ref 8.4–10.5)
Chloride: 106 mEq/L (ref 96–112)
Creat: 0.57 mg/dL (ref 0.50–1.10)
GFR, Est African American: >89 mL/min
GFR, Est Non African American: >89 mL/min
Glucose, Bld: 99 mg/dL (ref 70–99)
Potassium: 4.9 mEq/L (ref 3.5–5.3)
Sodium: 142 mEq/L (ref 135–145)

## 2013-06-01 LAB — HEPATIC FUNCTION PANEL
ALT: 13 U/L (ref 0–35)
AST: 16 U/L (ref 0–37)
Albumin: 4.2 g/dL (ref 3.5–5.2)
Alkaline Phosphatase: 66 U/L (ref 39–117)
Bilirubin, Direct: 0.1 mg/dL (ref 0.0–0.3)
Indirect Bilirubin: 0.2 mg/dL (ref 0.2–1.2)
Total Bilirubin: 0.3 mg/dL (ref 0.2–1.2)
Total Protein: 6.8 g/dL (ref 6.0–8.3)

## 2013-06-01 LAB — LIPID PANEL
Cholesterol: 158 mg/dL (ref 0–200)
HDL: 51 mg/dL (ref >39)
LDL Cholesterol: 85 mg/dL (ref 0–99)
Total CHOL/HDL Ratio: 3.1 Ratio
Triglycerides: 110 mg/dL (ref <150)
VLDL: 22 mg/dL (ref 0–40)

## 2013-06-01 LAB — HEMOGLOBIN A1C
Hgb A1c MFr Bld: 6.2 % — ABNORMAL HIGH (ref <5.7)
Mean Plasma Glucose: 131 mg/dL — ABNORMAL HIGH (ref <117)

## 2013-06-01 MED ORDER — ONDANSETRON 4 MG PO TBDP: 4.0000 mg | ORAL_TABLET | Freq: Three times a day (TID) | ORAL | Status: DC | PRN

## 2013-06-01 NOTE — Progress Notes (Signed)
Pre visit review using our clinic review tool, if applicable. No additional management support is needed unless otherwise documented below in the visit note. 

## 2013-06-01 NOTE — Patient Instructions (Signed)
Please complete lab work prior to leaving. Follow up in 3 months.  

## 2013-06-01 NOTE — Assessment & Plan Note (Signed)
rx provided for zofran odt, as it is non-drowsy.

## 2013-06-01 NOTE — Assessment & Plan Note (Signed)
Stable on low sodium diet.

## 2013-06-01 NOTE — Progress Notes (Signed)
Subjective:    Patient ID: Whitney Neal, female    DOB: 1961-01-25, 53 y.o.   MRN: [id number]  HPI  Whitney Neal is a 53 yr old female who presents today for follow up.   Migraine-  She woke up yesterday AM with migraine. Reports HA is mild currently. Migraine is associated with nausea. She is maintained on topamax, elavil.  She reports that she follows with Dr. Bobby Rumpf and he gave her ? imitrex which helps. Has rx for phenergan but it makes her drowsy so she did not take.   HTN  Currently maintained on low sodium diet.   BP Readings from Last 3 Encounters:  06/01/13 122/82  12/08/12 126/80  09/09/12 106/80   Hyperlipidemia-  Currently maintained on pravastatin.  Last LDL was at goal.    Review of Systems See HPI  Past Medical History  Diagnosis Date  . Cervical lymphadenopathy     left  . Ulcer 2010  . Hypercholesteremia   . Urinary incontinence   . GERD (gastroesophageal reflux disease)   . Hypertension   . Constipation   . Hyperplastic colonic polyp     history of   . Epigastric pain   . Cervical strain   . Atypical chest pain   . Osteopenia   . Gastric ulcer   . Barrett's esophagus     history of   . Vitamin D deficiency   . Hyperglycemia     a1c 6.3 7/12  . Hyperplastic colon polyp     History   Social History  . Marital Status: Married    Spouse Name: N/A    Number of Children: N/A  . Years of Education: N/A   Occupational History  . Not on file.   Social History Main Topics  . Smoking status: Never Smoker   . Smokeless tobacco: Not on file  . Alcohol Use: No  . Drug Use: No  . Sexual Activity: Not on file   Other Topics Concern  . Not on file   Social History Narrative   Reviewed history from 07/04/2009 and no changes required.   Married- is guardian for two grandchildren ages 81 and 77.   Never Smoked   Alcohol use-no   Regular exercise-no   Works in housekeeping at [employer]          Past Surgical History  Procedure  Laterality Date  . Bladder surgery      bladder mesh surgery 2010- Dr. Rhodia Albright  . Colonoscopy w/ polypectomy  2010  . Abdominal hysterectomy  2009    Dr. Bethann Goo    Family History  Problem Relation Age of Onset  . Diabetes Sister   . Colon cancer Father   . Pulmonary embolism Mother     deceased  . Hypertension Brother   . Hypertension Sister   . Hypertension Father   . Hypertension Mother   . Cancer Paternal Grandmother     oral  . Thyroid disease Sister     1/2 sister thyroid problem  . Healthy Daughter   . Heart attack Brother 55  . Heart attack Father 8    Allergies  Allergen Reactions  . Codeine     REACTION: headache, visual disturbance    Current Outpatient Prescriptions on File Prior to Visit  Medication Sig Dispense Refill  . amitriptyline (ELAVIL) 25 MG tablet Take 1 tablet (25 mg total) by mouth at bedtime.  30 tablet  2  . aspirin 81 MG EC tablet  Take 81 mg by mouth daily.        . Calcium Carbonate-Vitamin D (CALCIUM 600+D) 600-400 MG-UNIT per tablet Take 1 tablet by mouth 2 (two) times daily.        . chlorzoxazone (PARAFON) 500 MG tablet Take 500 mg by mouth 3 (three) times daily as needed for muscle spasms.      . cholecalciferol (VITAMIN D) 1000 UNITS tablet Take 3,000 Units by mouth daily.      . cyclobenzaprine (FLEXERIL) 5 MG tablet Take 1 tablet (5 mg total) by mouth 3 (three) times daily as needed for muscle spasms.  20 tablet  0  . estradiol (ESTRACE) 0.1 MG/GM vaginal cream Place 2 g vaginally. Two times a week.       . isometheptene-acetaminophen-dichloralphenazone (MIDRIN) 65-325-100 MG capsule Take 1-2 capsules by mouth 4 (four) times daily as needed for migraine. Maximum 5 capsules in 12 hours for migraine headaches, 8 capsules in 24 hours for tension headaches.      Marland Kitchen omeprazole-sodium bicarbonate (ZEGERID) 40-1100 MG per capsule Take 1 capsule by mouth 2 (two) times daily.  60 capsule  3  . pravastatin (PRAVACHOL) 40 MG tablet TAKE 1 TABLET BY  MOUTH AT BEDTIME  90 tablet  1  . promethazine (PHENERGAN) 25 MG tablet Take 25 mg by mouth every 4 (four) hours as needed for nausea.      Marland Kitchen topiramate (TOPAMAX) 25 MG tablet Take 25 mg by mouth at bedtime. May increase by 1 tablet each week until you are taking 4 tablets at bedtime if needed.       No current facility-administered medications on file prior to visit.    BP 122/82  Pulse 81  Temp(Src) 97.8 F (36.6 C) (Oral)  Resp 16  Ht 5' (1.524 m)  Wt 153 lb (69.4 kg)  BMI 29.88 kg/m2  SpO2 99%       Objective:   Physical Exam  Constitutional: She is oriented to person, place, and time. She appears well-developed and well-nourished. No distress.  HENT:  Head: Normocephalic and atraumatic.  Cardiovascular: Normal rate and regular rhythm.   No murmur heard. Pulmonary/Chest: Effort normal and breath sounds normal. No respiratory distress. She has no wheezes. She has no rales. She exhibits no tenderness.  Musculoskeletal: She exhibits no edema.  Neurological: She is alert and oriented to person, place, and time.  Psychiatric: She has a normal mood and affect. Her behavior is normal. Judgment and thought content normal.          Assessment & Plan:

## 2013-06-01 NOTE — Assessment & Plan Note (Signed)
Tolerating statin, continue same.  Obtain lft/lipid panel.

## 2013-06-03 ENCOUNTER — Encounter: Payer: Self-pay | Admitting: Family

## 2013-06-15 ENCOUNTER — Emergency Department (HOSPITAL_BASED_OUTPATIENT_CLINIC_OR_DEPARTMENT_OTHER)
Admission: EM | Admit: 2013-06-15 | Discharge: 2013-06-15 | Disposition: A | Discharge status: Home / Self Care | Payer: 59 | Attending: Emergency Medicine | Admitting: Emergency Medicine

## 2013-06-15 ENCOUNTER — Ambulatory Visit (INDEPENDENT_AMBULATORY_CARE_PROVIDER_SITE_OTHER): Payer: 59 | Admitting: Family

## 2013-06-15 ENCOUNTER — Emergency Department (HOSPITAL_BASED_OUTPATIENT_CLINIC_OR_DEPARTMENT_OTHER): Payer: 59

## 2013-06-15 ENCOUNTER — Encounter (HOSPITAL_BASED_OUTPATIENT_CLINIC_OR_DEPARTMENT_OTHER): Payer: Self-pay | Admitting: Emergency Medicine

## 2013-06-15 ENCOUNTER — Ambulatory Visit: Payer: 59 | Admitting: Family

## 2013-06-15 VITALS — BP 170/120 | HR 118 | Temp 98.1°F | Resp 18 | Ht 60.0 in

## 2013-06-15 DIAGNOSIS — R079 Chest pain, unspecified: Secondary | ICD-10-CM | POA: Diagnosis not present

## 2013-06-15 DIAGNOSIS — Z79899 Other long term (current) drug therapy: Secondary | ICD-10-CM | POA: Diagnosis not present

## 2013-06-15 DIAGNOSIS — I10 Essential (primary) hypertension: Secondary | ICD-10-CM | POA: Diagnosis not present

## 2013-06-15 DIAGNOSIS — M899 Disorder of bone, unspecified: Secondary | ICD-10-CM | POA: Diagnosis not present

## 2013-06-15 DIAGNOSIS — Z87828 Personal history of other (healed) physical injury and trauma: Secondary | ICD-10-CM | POA: Diagnosis not present

## 2013-06-15 DIAGNOSIS — Z872 Personal history of diseases of the skin and subcutaneous tissue: Secondary | ICD-10-CM | POA: Diagnosis not present

## 2013-06-15 DIAGNOSIS — E7800 Pure hypercholesterolemia, unspecified: Secondary | ICD-10-CM | POA: Insufficient documentation

## 2013-06-15 DIAGNOSIS — M79609 Pain in unspecified limb: Secondary | ICD-10-CM | POA: Diagnosis present

## 2013-06-15 DIAGNOSIS — R112 Nausea with vomiting, unspecified: Secondary | ICD-10-CM | POA: Insufficient documentation

## 2013-06-15 DIAGNOSIS — M949 Disorder of cartilage, unspecified: Secondary | ICD-10-CM

## 2013-06-15 DIAGNOSIS — Z7982 Long term (current) use of aspirin: Secondary | ICD-10-CM | POA: Insufficient documentation

## 2013-06-15 DIAGNOSIS — E559 Vitamin D deficiency, unspecified: Secondary | ICD-10-CM | POA: Insufficient documentation

## 2013-06-15 DIAGNOSIS — Z860100 Personal history of colon polyps, unspecified: Secondary | ICD-10-CM | POA: Insufficient documentation

## 2013-06-15 DIAGNOSIS — K219 Gastro-esophageal reflux disease without esophagitis: Secondary | ICD-10-CM | POA: Insufficient documentation

## 2013-06-15 LAB — BASIC METABOLIC PANEL
BUN: 15 mg/dL (ref 6–23)
CO2: 26 mEq/L (ref 19–32)
Calcium: 9.8 mg/dL (ref 8.4–10.5)
Chloride: 103 mEq/L (ref 96–112)
Creatinine, Ser: 0.6 mg/dL (ref 0.50–1.10)
GFR calc Af Amer: >90 mL/min (ref >90)
GFR calc non Af Amer: >90 mL/min (ref >90)
Glucose, Bld: 128 mg/dL — ABNORMAL HIGH (ref 70–99)
Potassium: 4.2 mEq/L (ref 3.7–5.3)
Sodium: 142 mEq/L (ref 137–147)

## 2013-06-15 LAB — TROPONIN I
Troponin I: <0.3 ng/mL (ref <0.30)
Troponin I: <0.3 ng/mL (ref <0.30)

## 2013-06-15 LAB — CBC WITH DIFFERENTIAL/PLATELET
Basophils Absolute: 0 10*3/uL (ref 0.0–0.1)
Basophils Relative: 0 % (ref 0–1)
Eosinophils Absolute: 0 10*3/uL (ref 0.0–0.7)
Eosinophils Relative: 0 % (ref 0–5)
HCT: 42.4 % (ref 36.0–46.0)
Hemoglobin: 14.4 g/dL (ref 12.0–15.0)
Lymphocytes Relative: 28 % (ref 12–46)
Lymphs Abs: 1.4 10*3/uL (ref 0.7–4.0)
MCH: 29.3 pg (ref 26.0–34.0)
MCHC: 34 g/dL (ref 30.0–36.0)
MCV: 86.4 fL (ref 78.0–100.0)
Monocytes Absolute: 0.3 10*3/uL (ref 0.1–1.0)
Monocytes Relative: 7 % (ref 3–12)
Neutro Abs: 3.3 10*3/uL (ref 1.7–7.7)
Neutrophils Relative %: 65 % (ref 43–77)
Platelets: 315 10*3/uL (ref 150–400)
RBC: 4.91 MIL/uL (ref 3.87–5.11)
RDW: 13.3 % (ref 11.5–15.5)
WBC: 5.1 10*3/uL (ref 4.0–10.5)

## 2013-06-15 MED ORDER — ASPIRIN 81 MG PO CHEW
324.0000 mg | CHEWABLE_TABLET | Freq: Once | ORAL | Status: AC
  Administered 2013-06-15: 324 mg via ORAL
  Filled 2013-06-15: qty 4

## 2013-06-15 NOTE — Assessment & Plan Note (Signed)
+   nausea/left arm pain, chest heaviness.  Pt was sent down to ED for further evaluation. Report given to charge nurse Terrie.

## 2013-06-15 NOTE — ED Notes (Signed)
Patient transported to X-ray 

## 2013-06-15 NOTE — ED Notes (Signed)
Patient was sent by Carolynn Sayers, with a diagnosis of atrial flutter.  Patient states she has felt bad for one week, with c/o left upper arm pain, fatigue and nausea.  Today felt worse while working, and went to see her PCP.

## 2013-06-15 NOTE — Discharge Instructions (Signed)
Dolor en el pecho (inespecfico) (Chest Pain, Nonspecific) Frecuentemente es difcil dar un diagnstico especfico de la causa de un dolor en el pecho. Siempre existe una posibilidad de que el dolor est relacionado con algo ms grave como un ataque cardaco o un cogulo en los pulmones. Necesitar realizar un seguimiento con el mdico para Brewing technologist.  CAUSAS  Acidez.  Neumona o bronquitis.  Ansiedad o estrs.  Una inflamacin de la zona que rodea al corazn (pericarditis) o a los pulmones (pleuritis, pleuresa).  Un cogulo sanguneo en el pulmn.  Pulmones colapsados (neumotrax). Puede aparecer de [place] repentina por s solo (neumotrax espontneo) o por un traumatismo en el pecho.  Culebrilla (virus del herpes zster). Las paredes del pecho estn compuestas de [place], msculos y Estate manager/land agent. Cualquiera de estos puede ser fuente del dolor.  Puede haber una contusin en los huesos debido a una lesin.  Puede haber un esguince en los msculos o el cartlago ocasionado por la tos o un esfuerzo.  El cartlago tambin puede verse afectado por una inflamacin y Orthoptist (costocondritis). DIAGNSTICO Puede ser necesario realizar anlisis de laboratorio u otros estudios tales como radiografas, Aeronautical engineer, prueba de esfuerzo, o diagnstico por imgenes para el corazn para determinar la causa de su dolor.  TRATAMIENTO  El tratamiento depender de la causa que provoque el dolor en el pecho. El tratamiento pueden incluir:  Bloqueadores de cido para la acidez estomacal.  Medicamentos antiinflamatorios.  Analgsicos para las enfermedades inflamatorias.  Antibiticos si existe infeccin.  Podrn aconsejarle que modifique su estilo de vida. Esto incluye dejar de fumar y evitar el alcohol, la cafena y el chocolate.  Se le aconsejar que duerma con la cabeza levantada [place]). Esto reduce la probabilidad de que el cido vuelva hacia atrs desde el estmago  hasta el esfago.  [place] veces, Conservation officer, historic buildings en el pecho no especfico mejora dentro de los 2 a 3 das con reposo y medicamentos para Clinical biochemist. INSTRUCCIONES PARA EL CUIDADO DOMICILIARIO  Si le prescriben antibiticos, tmelos tal como se le indic. Tmelos todos, aunque se sienta mejor.  [place], evite la actividad fsica que le hace Patent examiner. Contine con las actividades fsicas tal como se le indic.  No fume.  Evite consumir alcohol.  Slo tome medicamentos de Radio broadcast assistant o prescriptos para Glass blower/designer, las molestias o bajar la fiebre segn las indicaciones de su mdico.  Siga las indicaciones del profesional que lo asiste para un mayor control si los problemas persisten.  Cumpla con todas las visitas de control programadas. Si no lo hace, podr desarrollar problemas permanentes (crnicos) relacionados con el dolor. Si tiene algn problema para asistir a la cita, debe comunicarse con el establecimiento para obtener asistencia. SOLICITE ATENCIN MDICA SI:  Tiene problemas que considere que pueden ser efectos secundarios de los medicamentos que toma. Lea con [place] recomendaciones para su medicacin.  El dolor en el pecho contina incluso despus de haber seguido el tratamiento.  Observa una erupcin en el pecho, que presenta ampollas. SOLICITE ATENCIN [place] DE INMEDIATO SI:  El dolor en el pecho aumenta o se extiende al brazo, cuello, mandbula, espalda o abdomen.  Le falta el aire, aumenta la tos o tose y [place].  Tiene dolor intenso en la espalda o el abdomen, nuseas o vmitos.  Presenta debilidad, desmayos o escalofros.  Tiene fiebre. ESTO ES UNA EMERGENCIA. No espere a que el dolor se vaya. Pida ayuda mdica de  inmediato. Comunquese con el servicio de urgencias de su localidad (911 en los [place]). No maneje solo [employer]. EST SEGURO QUE:   Comprende las instrucciones para el alta  mdica.  Controlar su enfermedad.  Solicitar atencin mdica de inmediato segn las indicaciones. Document Released: 02/05/2005 Document Revised: 04/30/2011 [facility] Patient Information 2014 [place], [place].   Hipertensin (Hypertension) Cuando el corazn late [facility] sangre a travs de las arterias. La fuerza que se origina es la presin arterial. Si la presin es demasiado elevada, se denomina hipertensin. El peligro radica en que puede sufrirla y no saberlo. Hipertensin puede significar que su corazn debe trabajar ms intensamente para bombear sangre. Las arterias pueden Insurance risk surveyor o rgidas. El [place] extra [place] el riesgo de enfermedades cardacas, ictus y [employer].  La presin arterial est formada por dos nmeros: el nmero mayor sobre el nmero menor, por ejemplo110/70. Se seala "110/72". Los valores ideales son por debajo de 120 para el nmero ms alto (sistlica) y por debajo de 80 para el ms bajo (diastlica). Anote su presin sangunea hoy. Debe prestar mucha atencin a su presin arterial si sufre alguna otra enfermedad como:  Insuficiencia cardaca  Ataques cardiacos previos  Diabetes  Enfermedad renal crnica  Ictus previo  Mltiples factores de riesgo para enfermedades cardacas Para diagnosticar si usted sufre hipertensin arterial, debe medirse la presin mientras encuentra sentado con el brazo elevado a la altura del nivel del corazn. Debe medirse al menos 2 veces. Una nica lectura de presin arterial elevada (especialmente en el servicio de emergencias) no significa que necesita tratamiento. Hay enfermedades en las que la presin arterial es diferente en ambos brazos. Es importante que consulte rpidamente con su mdico para un control. La [employer] de las personas sufren hipertensin esencial, lo que significa que no tiene una causa especfica. Este tipo de hipertensin puede bajarse modificando algunos factores en el estilo de vida  como:  Psychologist, forensic.  El consumo de cigarrillos.  La falta de actividad fsica.  Peso excesivo  Consumo de drogas y alcohol.  Consumiendo menos sal La mayora de las personas no tienen sntomas hasta que la hipertensin ocasiona un dao en el organismo. El tratamiento efectivo puede evitar, Network engineer o reducir ese dao. TRATAMIENTO: El tratamiento para la hipertensin, cuando se ha identificado una causa, est dirigido a la misma Hay un gran nmero en medicamentos para tratarla. Se agrupan en diferentes categoras y Office manager los medicamentos indicados para usted. Muchos medicamentos disponibles tienen efectos secundarios. Debe comentar los efectos secundarios con su mdico. Si la presin arterial permanece elevada despus de modificar su estilo de vida o comenzar a tomar medicamentos:  Los medicamentos deben ser reemplazados  Puede ser necesario evaluar otros problemas.  Debe estar seguro que comprende las indicaciones, que sabe cmo y cundo [employer].  Asegrese de Optometrist un control con su mdico dentro del tiempo indicado (generalmente dentro de las [employer]) para volver a Mudlogger presin arterial y Health visitor los medicamentos prescritos.  Si est tomando ms de un medicamento para la presin arterial, asegrese que sabe cmo y en qu momentos debe tomarlos. Tomar los medicamentos al mismo tiempo puede dar como resultado un gran descenso en la presin arterial. SOLICITE ATENCIN MDICA DE INMEDIATO SI PRESENTA:  Dolor de cabeza intenso, visin borrosa o cambios en la visin, o confusin.  Debilidad o adormecimientos inusuales o sensacin de desmayo.  Dolor de pecho o abdominal intenso, vmitos o problemas para respirar. ASEGRESE QUE:  Comprende estas instrucciones.  Controlar su enfermedad.  Solicitar ayuda inmediatamente si no mejora o si empeora. Document Released: 02/05/2005 Document Revised: 04/30/2011 [facility] Patient Information 2014  [place], [place].

## 2013-06-15 NOTE — Progress Notes (Signed)
Subjective:    Patient ID: Whitney Neal, female    DOB: 06/22/1960, 53 y.o.   MRN: [id number]  HPI  Whitney Neal is a 53 yr old female who presents today with chief complaint of elevated blood pressure, nausea, left arm pain and chest heaviness.  Symptoms started 2 days ago.    Review of Systems See HPI  Past Medical History  Diagnosis Date  . Cervical lymphadenopathy     left  . Ulcer 2010  . Hypercholesteremia   . Urinary incontinence   . GERD (gastroesophageal reflux disease)   . Hypertension   . Constipation   . Hyperplastic colonic polyp     history of   . Epigastric pain   . Cervical strain   . Atypical chest pain   . Osteopenia   . Gastric ulcer   . Barrett's esophagus     history of   . Vitamin D deficiency   . Hyperglycemia     a1c 6.3 7/12  . Hyperplastic colon polyp     History   Social History  . Marital Status: Married    Spouse Name: N/A    Number of Children: N/A  . Years of Education: N/A   Occupational History  . Not on file.   Social History Main Topics  . Smoking status: Never Smoker   . Smokeless tobacco: Not on file  . Alcohol Use: No  . Drug Use: No  . Sexual Activity: Not on file   Other Topics Concern  . Not on file   Social History Narrative   Reviewed history from 07/04/2009 and no changes required.   Married- is guardian for two grandchildren ages 14 and 81.   Never Smoked   Alcohol use-no   Regular exercise-no   Works in housekeeping at [employer]          Past Surgical History  Procedure Laterality Date  . Bladder surgery      bladder mesh surgery 2010- Dr. Rhodia Albright  . Colonoscopy w/ polypectomy  2010  . Abdominal hysterectomy  2009    Dr. Bethann Goo    Family History  Problem Relation Age of Onset  . Diabetes Sister   . Colon cancer Father   . Pulmonary embolism Mother     deceased  . Hypertension Brother   . Hypertension Sister   . Hypertension Father   . Hypertension Mother   . Cancer Paternal  Grandmother     oral  . Thyroid disease Sister     1/2 sister thyroid problem  . Healthy Daughter   . Heart attack Brother 9  . Heart attack Father 78    Allergies  Allergen Reactions  . Codeine     REACTION: headache, visual disturbance    Current Outpatient Prescriptions on File Prior to Visit  Medication Sig Dispense Refill  . amitriptyline (ELAVIL) 25 MG tablet Take 1 tablet (25 mg total) by mouth at bedtime.  30 tablet  2  . aspirin 81 MG EC tablet Take 81 mg by mouth daily.        . Calcium Carbonate-Vitamin D (CALCIUM 600+D) 600-400 MG-UNIT per tablet Take 1 tablet by mouth 2 (two) times daily.        . chlorzoxazone (PARAFON) 500 MG tablet Take 500 mg by mouth 3 (three) times daily as needed for muscle spasms.      . cholecalciferol (VITAMIN D) 1000 UNITS tablet Take 3,000 Units by mouth daily.      Marland Kitchen  cyclobenzaprine (FLEXERIL) 5 MG tablet Take 1 tablet (5 mg total) by mouth 3 (three) times daily as needed for muscle spasms.  20 tablet  0  . estradiol (ESTRACE) 0.1 MG/GM vaginal cream Place 2 g vaginally. Two times a week.       . isometheptene-acetaminophen-dichloralphenazone (MIDRIN) 65-325-100 MG capsule Take 1-2 capsules by mouth 4 (four) times daily as needed for migraine. Maximum 5 capsules in 12 hours for migraine headaches, 8 capsules in 24 hours for tension headaches.      Marland Kitchen omeprazole-sodium bicarbonate (ZEGERID) 40-1100 MG per capsule Take 1 capsule by mouth 2 (two) times daily.  60 capsule  3  . ondansetron (ZOFRAN ODT) 4 MG disintegrating tablet Take 1 tablet (4 mg total) by mouth every 8 (eight) hours as needed for nausea or vomiting.  20 tablet  0  . pravastatin (PRAVACHOL) 40 MG tablet TAKE 1 TABLET BY MOUTH AT BEDTIME  90 tablet  1  . promethazine (PHENERGAN) 25 MG tablet Take 25 mg by mouth every 4 (four) hours as needed for nausea.      Marland Kitchen topiramate (TOPAMAX) 25 MG tablet Take 25 mg by mouth at bedtime. May increase by 1 tablet each week until you are taking 4  tablets at bedtime if needed.       No current facility-administered medications on file prior to visit.    BP 170/120  Pulse 118  Temp(Src) 98.1 F (36.7 C) (Oral)  Resp 18  Ht 5' (1.524 m)  SpO2 100%       Objective:   Physical Exam  Constitutional: She is oriented to person, place, and time. She appears well-developed and well-nourished. She appears distressed.  Crying/uncomfortable appearing  Cardiovascular: Tachycardia present.   Pulmonary/Chest: Effort normal and breath sounds normal. No respiratory distress. She has no wheezes. She has no rales. She exhibits no tenderness.  Neurological: She is alert and oriented to person, place, and time.  Psychiatric: Her behavior is normal. Judgment and thought content normal.          Assessment & Plan:

## 2013-06-15 NOTE — ED Provider Notes (Signed)
CSN: [id number]     Arrival date & time 06/15/13  0815 History   First MD Initiated Contact with Patient 06/15/13 [phone]     Chief Complaint  Patient presents with  . Arm Pain     (Consider location/radiation/quality/duration/timing/severity/associated sxs/prior Treatment) Patient is a 53 y.o. female presenting with arm pain.  Arm Pain   Pt with history of diet controlled HTN reports she has not been feeling well for several days. She reports feeling nauseated at times, 'heavy' and intermittent aching pain in L biceps. She was at work today and felt nauseated, vomited once. Also began complaining of mild L chest pain which has since resolved. She went to her PCP office who did an EKG and sent her to the ED for further eval. PCP EKG machine read is Atrial flutter, but clearly a sinus rhythm there and here.   Past Medical History  Diagnosis Date  . Cervical lymphadenopathy     left  . Ulcer 2010  . Hypercholesteremia   . Urinary incontinence   . GERD (gastroesophageal reflux disease)   . Hypertension   . Constipation   . Hyperplastic colonic polyp     history of   . Epigastric pain   . Cervical strain   . Atypical chest pain   . Osteopenia   . Gastric ulcer   . Barrett's esophagus     history of   . Vitamin D deficiency   . Hyperglycemia     a1c 6.3 7/12  . Hyperplastic colon polyp    Past Surgical History  Procedure Laterality Date  . Bladder surgery      bladder mesh surgery 2010- Dr. Rhodia Albright  . Colonoscopy w/ polypectomy  2010  . Abdominal hysterectomy  2009    Dr. Bethann Goo   Family History  Problem Relation Age of Onset  . Diabetes Sister   . Colon cancer Father   . Pulmonary embolism Mother     deceased  . Hypertension Brother   . Hypertension Sister   . Hypertension Father   . Hypertension Mother   . Cancer Paternal Grandmother     oral  . Thyroid disease Sister     1/2 sister thyroid problem  . Healthy Daughter   . Heart attack Brother 53  . Heart attack  Father 61   History  Substance Use Topics  . Smoking status: Never Smoker   . Smokeless tobacco: Not on file  . Alcohol Use: No   OB History   Grav Para Term Preterm Abortions TAB SAB Ect Mult Living                 Review of Systems  All other systems reviewed and are negative except as noted in HPI.     Allergies  Codeine  Home Medications   Prior to Admission medications   Medication Sig Start Date End Date Taking? Authorizing Provider  amitriptyline (ELAVIL) 25 MG tablet Take 1 tablet (25 mg total) by mouth at bedtime. 05/23/12   Debbrah Alar, NP  aspirin 81 MG EC tablet Take 81 mg by mouth daily.      Historical Provider, MD  Calcium Carbonate-Vitamin D (CALCIUM 600+D) 600-400 MG-UNIT per tablet Take 1 tablet by mouth 2 (two) times daily.      Historical Provider, MD  chlorzoxazone (PARAFON) 500 MG tablet Take 500 mg by mouth 3 (three) times daily as needed for muscle spasms.    Historical Provider, MD  cholecalciferol (VITAMIN D) 1000 UNITS tablet  Take 3,000 Units by mouth daily.    Historical Provider, MD  cyclobenzaprine (FLEXERIL) 5 MG tablet Take 1 tablet (5 mg total) by mouth 3 (three) times daily as needed for muscle spasms. 11/12/11   Debbrah Alar, NP  estradiol (ESTRACE) 0.1 MG/GM vaginal cream Place 2 g vaginally. Two times a week.     Historical Provider, MD  isometheptene-acetaminophen-dichloralphenazone (MIDRIN) 65-325-100 MG capsule Take 1-2 capsules by mouth 4 (four) times daily as needed for migraine. Maximum 5 capsules in 12 hours for migraine headaches, 8 capsules in 24 hours for tension headaches.    Historical Provider, MD  omeprazole-sodium bicarbonate (ZEGERID) 40-1100 MG per capsule Take 1 capsule by mouth 2 (two) times daily. 09/09/12   Debbrah Alar, NP  ondansetron (ZOFRAN ODT) 4 MG disintegrating tablet Take 1 tablet (4 mg total) by mouth every 8 (eight) hours as needed for nausea or vomiting. 06/01/13   Debbrah Alar, NP   pravastatin (PRAVACHOL) 40 MG tablet TAKE 1 TABLET BY MOUTH AT BEDTIME 05/15/13   Debbrah Alar, NP  promethazine (PHENERGAN) 25 MG tablet Take 25 mg by mouth every 4 (four) hours as needed for nausea.    Historical Provider, MD  topiramate (TOPAMAX) 25 MG tablet Take 25 mg by mouth at bedtime. May increase by 1 tablet each week until you are taking 4 tablets at bedtime if needed.    Historical Provider, MD   BP 158/85  Pulse 92  Temp(Src) 98.3 F (36.8 C) (Oral)  Resp 16  Ht 5' (1.524 m)  Wt 153 lb (69.4 kg)  BMI 29.88 kg/m2  SpO2 100% Physical Exam  Nursing note and vitals reviewed. Constitutional: She is oriented to person, place, and time. She appears well-developed and well-nourished.  HENT:  Head: Normocephalic and atraumatic.  Eyes: EOM are normal. Pupils are equal, round, and reactive to light.  Neck: Normal range of motion. Neck supple.  Cardiovascular: Normal rate, normal heart sounds and intact distal pulses.   Pulmonary/Chest: Effort normal and breath sounds normal.  Abdominal: Bowel sounds are normal. She exhibits no distension. There is no tenderness.  Musculoskeletal: Normal range of motion. She exhibits no edema and no tenderness.  Neurological: She is alert and oriented to person, place, and time. She has normal strength. No cranial nerve deficit or sensory deficit.  Skin: Skin is warm and dry. No rash noted.  Psychiatric: She has a normal mood and affect.    ED Course  Procedures (including critical care time) Labs Review Labs Reviewed  BASIC METABOLIC PANEL - Abnormal; Notable for the following:    Glucose, Bld 128 (*)    All other components within normal limits  CBC WITH DIFFERENTIAL  TROPONIN I  TROPONIN I    Imaging Review Dg Chest 2 View  06/15/2013   CLINICAL DATA:  Chest pain.  High blood pressure.  EXAM: CHEST  2 VIEW  COMPARISON:  08/23/2010  FINDINGS: The heart size and mediastinal contours are within normal limits. Both lungs are clear.  The visualized skeletal structures are unremarkable.  IMPRESSION: No active cardiopulmonary disease.   Electronically Signed   By: Lajean Manes M.D.   On: 06/15/2013 09:15     EKG Interpretation   Date/Time:  Monday June 15 2013 08:30:04 EDT Ventricular Rate:  103 PR Interval:  174 QRS Duration: 90 QT Interval:  370 QTC Calculation: 484 R Axis:   36 Text Interpretation:  Sinus tachycardia Otherwise normal ECG Since last  tracing Rate faster Confirmed by [facility]  MD, Juanda Crumble [phone]) on 06/15/2013  8:47:58 AM      MDM   Final diagnoses:  Chest pain  Hypertension    Labs imaging and EKG are unremarkable. Pt is hypertensive here but no signs of end organ damage. Will check second Trop. Low concern for PE or ACS.   11:41 AM Pt remains asymptomatic in the ED. Atypical symptoms, Trop neg x 2. HTN here but has been well controlled on diet at home. Advised to log BP at home for a week and call her PCP with those results. Return to the ED for any other concerns.    Charles B. Karle Starch, MD 06/15/13 1143

## 2013-06-16 ENCOUNTER — Telehealth: Payer: Self-pay | Admitting: Family

## 2013-06-16 NOTE — Telephone Encounter (Signed)
Relevant patient education assigned to patient using Emmi. ° °

## 2013-06-26 ENCOUNTER — Ambulatory Visit: Payer: 59 | Admitting: Family

## 2013-07-08 ENCOUNTER — Encounter: Payer: Self-pay | Admitting: Cardiology

## 2013-07-08 ENCOUNTER — Ambulatory Visit (INDEPENDENT_AMBULATORY_CARE_PROVIDER_SITE_OTHER): Payer: 59 | Admitting: Cardiology

## 2013-07-08 VITALS — BP 130/84 | HR 88 | Ht 60.0 in | Wt 149.0 lb

## 2013-07-08 DIAGNOSIS — E785 Hyperlipidemia, unspecified: Secondary | ICD-10-CM

## 2013-07-08 DIAGNOSIS — R079 Chest pain, unspecified: Secondary | ICD-10-CM

## 2013-07-08 DIAGNOSIS — I10 Essential (primary) hypertension: Secondary | ICD-10-CM

## 2013-07-08 NOTE — Progress Notes (Signed)
HPI: 53 year old female for evaluation of chest pain. Nuclear study in 2012 showed an ejection fraction of 70% and normal perfusion. Seen in emergency room recently for left arm pain. Enzymes negative. Chest x-ray unremarkable. Hemoglobin and potassium normal. Patient denies dyspnea on exertion, orthopnea, PND, pedal edema, palpitations or syncope. No exertional chest pain. Patient states that when she was seen in the emergency room her blood pressure was elevated but now is running 130/80.  Current Outpatient Prescriptions  Medication Sig Dispense Refill  . amitriptyline (ELAVIL) 25 MG tablet Take 1 tablet (25 mg total) by mouth at bedtime.  30 tablet  2  . aspirin 81 MG EC tablet Take 81 mg by mouth daily.        . Calcium Carbonate-Vitamin D (CALCIUM 600+D) 600-400 MG-UNIT per tablet Take 1 tablet by mouth 2 (two) times daily.        . chlorzoxazone (PARAFON) 500 MG tablet Take 500 mg by mouth 3 (three) times daily as needed for muscle spasms.      . cholecalciferol (VITAMIN D) 1000 UNITS tablet Take 3,000 Units by mouth daily.      . cyclobenzaprine (FLEXERIL) 5 MG tablet Take 1 tablet (5 mg total) by mouth 3 (three) times daily as needed for muscle spasms.  20 tablet  0  . estradiol (ESTRACE) 0.1 MG/GM vaginal cream Place 2 g vaginally. Two times a week.       . isometheptene-acetaminophen-dichloralphenazone (MIDRIN) 65-325-100 MG capsule Take 1-2 capsules by mouth 4 (four) times daily as needed for migraine. Maximum 5 capsules in 12 hours for migraine headaches, 8 capsules in 24 hours for tension headaches.      Marland Kitchen omeprazole-sodium bicarbonate (ZEGERID) 40-1100 MG per capsule Take 1 capsule by mouth 2 (two) times daily.  60 capsule  3  . ondansetron (ZOFRAN ODT) 4 MG disintegrating tablet Take 1 tablet (4 mg total) by mouth every 8 (eight) hours as needed for nausea or vomiting.  20 tablet  0  . pravastatin (PRAVACHOL) 40 MG tablet TAKE 1 TABLET BY MOUTH AT BEDTIME  90 tablet  1  .  promethazine (PHENERGAN) 25 MG tablet Take 25 mg by mouth every 4 (four) hours as needed for nausea.      Marland Kitchen topiramate (TOPAMAX) 25 MG tablet Take 25 mg by mouth at bedtime. May increase by 1 tablet each week until you are taking 4 tablets at bedtime if needed.       No current facility-administered medications for this visit.    Allergies  Allergen Reactions  . Codeine     REACTION: headache, visual disturbance    Past Medical History  Diagnosis Date  . Cervical lymphadenopathy     left  . Ulcer 2010  . Hypercholesteremia   . Urinary incontinence   . GERD (gastroesophageal reflux disease)   . Hypertension   . Constipation   . Hyperplastic colonic polyp     history of   . Cervical strain   . Atypical chest pain   . Osteopenia   . Barrett's esophagus     history of   . Vitamin D deficiency   . Hyperglycemia     a1c 6.3 7/12    Past Surgical History  Procedure Laterality Date  . Bladder surgery      bladder mesh surgery 2010- Dr. Rhodia Albright  . Colonoscopy w/ polypectomy  2010  . Abdominal hysterectomy  2009    Dr. Bethann Goo    History   Social  History  . Marital Status: Married    Spouse Name: N/A    Number of Children: 2  . Years of Education: N/A   Occupational History  . [employer]   Social History Main Topics  . Smoking status: Never Smoker   . Smokeless tobacco: Not on file  . Alcohol Use: No  . Drug Use: No  . Sexual Activity: Not on file   Other Topics Concern  . Not on file   Social History Narrative   Reviewed history from 07/04/2009 and no changes required.   Married- is guardian for two grandchildren ages 2 and 27.   Never Smoked   Alcohol use-no   Regular exercise-no   Works in housekeeping at [employer]          Family History  Problem Relation Age of Onset  . Diabetes Sister   . Colon cancer Father   . Pulmonary embolism Mother     deceased  . Hypertension Brother   . Hypertension Sister   . Hypertension Father     . Hypertension Mother   . Cancer Paternal Grandmother     oral  . Thyroid disease Sister     1/2 sister thyroid problem  . Healthy Daughter   . Heart attack Brother 35  . Heart attack Father 52    ROS: no fevers or chills, productive cough, hemoptysis, dysphasia, odynophagia, melena, hematochezia, dysuria, hematuria, rash, seizure activity, orthopnea, PND, pedal edema, claudication. Remaining systems are negative.  Physical Exam:   Blood pressure 130/84, pulse 88, height 5' (1.524 m), weight 149 lb (67.586 kg).  General:  Well developed/well nourished in NAD Skin warm/dry Patient not depressed No peripheral clubbing Back-normal HEENT-normal/normal eyelids Neck supple/normal carotid upstroke bilaterally; no bruits; no JVD; no thyromegaly chest - CTA/ normal expansion CV - RRR/normal S1 and S2; no murmurs, rubs or gallops;  PMI nondisplaced Abdomen -NT/ND, no HSM, no mass, + bowel sounds, no bruit 2+ femoral pulses, no bruits Ext-no edema, chords, 2+ DP Neuro-grossly nonfocal  ECG 06/15/2013-sinus tachycardia.

## 2013-07-08 NOTE — Assessment & Plan Note (Signed)
Blood pressure is much improved. She will follow this at home and we will add medications if needed.

## 2013-07-08 NOTE — Assessment & Plan Note (Signed)
Symptoms are extremely atypical. Previous nuclear study normal. I do not think further ischemia evaluation is indicated at this point. We will reconsider if symptoms change.

## 2013-07-08 NOTE — Assessment & Plan Note (Signed)
Continue statin. 

## 2013-07-08 NOTE — Patient Instructions (Signed)
Your physician wants you to follow-up in: ONE YEAR WITH DR CRENSHAW You will receive a reminder letter in the mail two months in advance. If you don't receive a letter, please call our office to schedule the follow-up appointment.  

## 2013-08-31 ENCOUNTER — Encounter: Payer: Self-pay | Admitting: Family

## 2013-08-31 ENCOUNTER — Ambulatory Visit: Payer: 59 | Admitting: Family

## 2013-08-31 ENCOUNTER — Ambulatory Visit (INDEPENDENT_AMBULATORY_CARE_PROVIDER_SITE_OTHER): Payer: 59 | Admitting: Family

## 2013-08-31 VITALS — BP 126/84 | HR 83 | Temp 98.6°F | Resp 16 | Ht 60.0 in | Wt 150.1 lb

## 2013-08-31 DIAGNOSIS — R7309 Other abnormal glucose: Secondary | ICD-10-CM

## 2013-08-31 DIAGNOSIS — R739 Hyperglycemia, unspecified: Secondary | ICD-10-CM

## 2013-08-31 DIAGNOSIS — G43909 Migraine, unspecified, not intractable, without status migrainosus: Secondary | ICD-10-CM

## 2013-08-31 DIAGNOSIS — I10 Essential (primary) hypertension: Secondary | ICD-10-CM

## 2013-08-31 DIAGNOSIS — Z0000 Encounter for general adult medical examination without abnormal findings: Secondary | ICD-10-CM

## 2013-08-31 NOTE — Progress Notes (Signed)
Pre visit review using our clinic review tool, if applicable. No additional management support is needed unless otherwise documented below in the visit note. 

## 2013-08-31 NOTE — Assessment & Plan Note (Signed)
Has about 3 migraines per month. Topamax relieves pain.

## 2013-08-31 NOTE — Assessment & Plan Note (Signed)
BP stable. FU in 4 months. BP Readings from Last 3 Encounters:  08/31/13 126/84  07/08/13 130/84  06/15/13 158/85

## 2013-08-31 NOTE — Patient Instructions (Signed)
Please complete lab work prior to leaving. Follow up in 4 months.  

## 2013-08-31 NOTE — Progress Notes (Signed)
Subjective:    Patient ID: Whitney Neal, female    DOB: 1960-05-03, 53 y.o.   MRN: [id number]  HPI Whitney Neal is a 53 yo female here today for FU.  Hypertension-  Home Blood pressure range/ average : 409-811B systolic, diastolic 14-78G. Edema: denies Walks twice for 30-40 minutes. BP Readings from Last 3 Encounters:  08/31/13 126/84  07/08/13 130/84  06/15/13 158/85   Migraines: She has about 3 migraines per month. Topamax relieves them.  Complains of dry cough that she has had since spring. Seems to cough more when she gets overheated. Seems to resolve when she takes omeprazole.  Pt requests order for annual mammogram.  Review of Systems  Respiratory: Positive for cough. Negative for shortness of breath.   Cardiovascular: Negative for chest pain, palpitations and leg swelling.  Gastrointestinal: Negative for diarrhea, constipation and blood in stool.   Past Medical History  Diagnosis Date  . Cervical lymphadenopathy     left  . Ulcer 2010  . Hypercholesteremia   . Urinary incontinence   . GERD (gastroesophageal reflux disease)   . Hypertension   . Constipation   . Hyperplastic colonic polyp     history of   . Cervical strain   . Atypical chest pain   . Osteopenia   . Barrett's esophagus     history of   . Vitamin D deficiency   . Hyperglycemia     a1c 6.3 7/12    History   Social History  . Marital Status: Married    Spouse Name: N/A    Number of Children: 2  . Years of Education: N/A   Occupational History  . [employer]   Social History Main Topics  . Smoking status: Never Smoker   . Smokeless tobacco: Not on file  . Alcohol Use: No  . Drug Use: No  . Sexual Activity: Not on file   Other Topics Concern  . Not on file   Social History Narrative   Reviewed history from 07/04/2009 and no changes required.   Married- is guardian for two grandchildren ages 91 and 5.   Never Smoked   Alcohol use-no   Regular exercise-no   Works in housekeeping at [employer]          Past Surgical History  Procedure Laterality Date  . Bladder surgery      bladder mesh surgery 2010- Dr. Rhodia Albright  . Colonoscopy w/ polypectomy  2010  . Abdominal hysterectomy  2009    Dr. Bethann Goo    Family History  Problem Relation Age of Onset  . Diabetes Sister   . Colon cancer Father   . Pulmonary embolism Mother     deceased  . Hypertension Brother   . Hypertension Sister   . Hypertension Father   . Hypertension Mother   . Cancer Paternal Grandmother     oral  . Thyroid disease Sister     1/2 sister thyroid problem  . Healthy Daughter   . Heart attack Brother 4  . Heart attack Father 65    Allergies  Allergen Reactions  . Codeine     REACTION: headache, visual disturbance    Current Outpatient Prescriptions on File Prior to Visit  Medication Sig Dispense Refill  . amitriptyline (ELAVIL) 25 MG tablet Take 1 tablet (25 mg total) by mouth at bedtime.  30 tablet  2  . aspirin 81 MG EC tablet Take 81 mg by mouth daily.        Marland Kitchen  Calcium Carbonate-Vitamin D (CALCIUM 600+D) 600-400 MG-UNIT per tablet Take 1 tablet by mouth 2 (two) times daily.        . chlorzoxazone (PARAFON) 500 MG tablet Take 500 mg by mouth 3 (three) times daily as needed for muscle spasms.      . cholecalciferol (VITAMIN D) 1000 UNITS tablet Take 3,000 Units by mouth daily.      . cyclobenzaprine (FLEXERIL) 5 MG tablet Take 1 tablet (5 mg total) by mouth 3 (three) times daily as needed for muscle spasms.  20 tablet  0  . estradiol (ESTRACE) 0.1 MG/GM vaginal cream Place 2 g vaginally. Two times a week.       . isometheptene-acetaminophen-dichloralphenazone (MIDRIN) 65-325-100 MG capsule Take 1-2 capsules by mouth 4 (four) times daily as needed for migraine. Maximum 5 capsules in 12 hours for migraine headaches, 8 capsules in 24 hours for tension headaches.      Marland Kitchen omeprazole-sodium bicarbonate (ZEGERID) 40-1100 MG per capsule Take 1 capsule by mouth 2 (two)  times daily.  60 capsule  3  . ondansetron (ZOFRAN ODT) 4 MG disintegrating tablet Take 1 tablet (4 mg total) by mouth every 8 (eight) hours as needed for nausea or vomiting.  20 tablet  0  . pravastatin (PRAVACHOL) 40 MG tablet TAKE 1 TABLET BY MOUTH AT BEDTIME  90 tablet  1  . promethazine (PHENERGAN) 25 MG tablet Take 25 mg by mouth every 4 (four) hours as needed for nausea.      Marland Kitchen topiramate (TOPAMAX) 25 MG tablet Take 25 mg by mouth at bedtime. May increase by 1 tablet each week until you are taking 4 tablets at bedtime if needed.       No current facility-administered medications on file prior to visit.    BP 126/84  Pulse 83  Temp(Src) 98.6 F (37 C) (Oral)  Resp 16  Ht 5' (1.524 m)  Wt 150 lb 1.9 oz (68.094 kg)  BMI 29.32 kg/m2  SpO2 99%        Objective:   Physical Exam  Constitutional: She is oriented to person, place, and time. She appears well-developed and well-nourished. No distress.  HENT:  Head: Normocephalic and atraumatic.  Mouth/Throat: No oropharyngeal exudate.  Eyes: Pupils are equal, round, and reactive to light. No scleral icterus.  Cardiovascular: Normal rate, regular rhythm, normal heart sounds and intact distal pulses.  Exam reveals no gallop and no friction rub.   No murmur heard. Pulmonary/Chest: Effort normal and breath sounds normal. No respiratory distress. She has no wheezes. She has no rales.  Lymphadenopathy:    She has no cervical adenopathy.  Neurological: She is alert and oriented to person, place, and time.  Skin: Skin is warm and dry. No rash noted. She is not diaphoretic. No erythema.  Psychiatric: She has a normal mood and affect. Her behavior is normal.          Assessment & Plan:  Patient seen by [facility] NP-student.  I have personally seen and examined patient and agree with Whitney Neal's assessment and plan.

## 2013-08-31 NOTE — Assessment & Plan Note (Signed)
Check A1c and BMET.

## 2013-09-01 LAB — BASIC METABOLIC PANEL
BUN: 19 mg/dL (ref 6–23)
CO2: 27 mEq/L (ref 19–32)
Calcium: 9.5 mg/dL (ref 8.4–10.5)
Chloride: 108 mEq/L (ref 96–112)
Creat: 0.74 mg/dL (ref 0.50–1.10)
Glucose, Bld: 102 mg/dL — ABNORMAL HIGH (ref 70–99)
Potassium: 4.8 mEq/L (ref 3.5–5.3)
Sodium: 143 mEq/L (ref 135–145)

## 2013-09-01 LAB — HEMOGLOBIN A1C
Hgb A1c MFr Bld: 6.1 % — ABNORMAL HIGH (ref <5.7)
Mean Plasma Glucose: 128 mg/dL — ABNORMAL HIGH (ref <117)

## 2013-09-02 ENCOUNTER — Encounter: Payer: Self-pay | Admitting: Family

## 2013-09-18 ENCOUNTER — Other Ambulatory Visit: Payer: Self-pay | Admitting: Family

## 2013-10-08 ENCOUNTER — Ambulatory Visit (HOSPITAL_COMMUNITY): Admission: RE | Admit: 2013-10-08 | Payer: 59 | Source: Ambulatory Visit

## 2013-10-08 ENCOUNTER — Other Ambulatory Visit: Payer: Self-pay | Admitting: Family

## 2013-10-08 ENCOUNTER — Ambulatory Visit (HOSPITAL_COMMUNITY)
Admission: RE | Admit: 2013-10-08 | Discharge: 2013-10-08 | Disposition: A | Discharge status: Home / Self Care | Payer: 59 | Source: Ambulatory Visit | Attending: Family | Admitting: Family

## 2013-10-08 DIAGNOSIS — Z1231 Encounter for screening mammogram for malignant neoplasm of breast: Secondary | ICD-10-CM

## 2013-11-23 ENCOUNTER — Telehealth: Payer: Self-pay | Admitting: *Deleted

## 2013-11-23 NOTE — Telephone Encounter (Signed)
Received call from pharmacy that zegerid cost has increased greatly. Requested verbal to give pt omeprazole and sodium bicarb separately. States pt was ok with change. Verbal authorization given.

## 2013-11-24 ENCOUNTER — Encounter: Payer: Self-pay | Admitting: Internal Medicine

## 2013-12-29 ENCOUNTER — Ambulatory Visit (INDEPENDENT_AMBULATORY_CARE_PROVIDER_SITE_OTHER): Payer: 59 | Admitting: Family

## 2013-12-29 ENCOUNTER — Encounter: Payer: Self-pay | Admitting: Family

## 2013-12-29 VITALS — BP 120/82 | HR 74 | Temp 98.1°F | Resp 16 | Ht 60.0 in | Wt 155.8 lb

## 2013-12-29 DIAGNOSIS — E785 Hyperlipidemia, unspecified: Secondary | ICD-10-CM

## 2013-12-29 DIAGNOSIS — R739 Hyperglycemia, unspecified: Secondary | ICD-10-CM

## 2013-12-29 LAB — BASIC METABOLIC PANEL
BUN: 18 mg/dL (ref 6–23)
CO2: 26 mEq/L (ref 19–32)
Calcium: 10.2 mg/dL (ref 8.4–10.5)
Chloride: 105 mEq/L (ref 96–112)
Creatinine, Ser: 0.7 mg/dL (ref 0.4–1.2)
GFR: 96.9 mL/min (ref >60.00)
Glucose, Bld: 95 mg/dL (ref 70–99)
Potassium: 4.2 mEq/L (ref 3.5–5.1)
Sodium: 141 mEq/L (ref 135–145)

## 2013-12-29 LAB — HEMOGLOBIN A1C: Hgb A1c MFr Bld: 6 % (ref 4.6–6.5)

## 2013-12-29 MED ORDER — PRAVASTATIN SODIUM 40 MG PO TABS: ORAL_TABLET | ORAL | Status: DC

## 2013-12-29 NOTE — Progress Notes (Signed)
Pre visit review using our clinic review tool, if applicable. No additional management support is needed unless otherwise documented below in the visit note. 

## 2013-12-29 NOTE — Progress Notes (Signed)
Subjective:    Patient ID: Whitney Neal, female    DOB: 10-26-1960, 53 y.o.   MRN: [id number]  Hypertension  Hyperglycemia  Gastrophageal Reflux    [name] is here today for a 4 month follow up for HTN and hyperglycemia. She has had a recent migraine (see below).    1). Hypertension-  Takes at home Range 120-130/68-80's   BP Readings from Last 3 Encounters:  12/29/13 120/82  08/31/13 126/84  07/08/13 130/84  She is watching sodium intake.  Exercise is described as "walking around the hospital for work".  She is compliant on her BP medication.   2). Hyperglycemia- Eating white bread. Does not check BS at home. She reports she does not eat sweets.  Next eye exam will be 01/27/14.   3). Migraines- Last episode was Sunday evening to Monday morning (11/8-11/9). Lasted until early this am when she reports she took medication (described as little red/white pill). She had a light residual headache in the morning. She reported vomiting twice, photophobia, and sound sensitivity. Location started in the front and then progressed to the right side. She describes this as a sharp pain. She sees Dr. Bobby Rumpf again on 12/1.    Review of Systems See HPI  Past Medical History  Diagnosis Date  . Cervical lymphadenopathy     left  . Ulcer 2010  . Hypercholesteremia   . Urinary incontinence   . GERD (gastroesophageal reflux disease)   . Hypertension   . Constipation   . Hyperplastic colonic polyp     history of   . Cervical strain   . Atypical chest pain   . Osteopenia   . Barrett's esophagus     history of   . Vitamin D deficiency   . Hyperglycemia     a1c 6.3 7/12    History   Social History  . Marital Status: Married    Spouse Name: N/A    Number of Children: 2  . Years of Education: N/A   Occupational History  . [employer]   Social History Main Topics  . Smoking status: Never Smoker   . Smokeless tobacco: Not on file  . Alcohol Use: No  . Drug  Use: No  . Sexual Activity: Not on file   Other Topics Concern  . Not on file   Social History Narrative   Reviewed history from 07/04/2009 and no changes required.   Married- is guardian for two grandchildren ages 53 and 51.   Never Smoked   Alcohol use-no   Regular exercise-no   Works in housekeeping at [employer]          Past Surgical History  Procedure Laterality Date  . Bladder surgery      bladder mesh surgery 2010- Dr. Rhodia Albright  . Colonoscopy w/ polypectomy  2010  . Abdominal hysterectomy  2009    Dr. Bethann Goo    Family History  Problem Relation Age of Onset  . Diabetes Sister   . Colon cancer Father   . Pulmonary embolism Mother     deceased  . Hypertension Brother   . Hypertension Sister   . Hypertension Father   . Hypertension Mother   . Cancer Paternal Grandmother     oral  . Thyroid disease Sister     1/2 sister thyroid problem  . Healthy Daughter   . Heart attack Brother 3  . Heart attack Father 18    Allergies  Allergen Reactions  . Codeine  REACTION: headache, visual disturbance    Current Outpatient Prescriptions on File Prior to Visit  Medication Sig Dispense Refill  . amitriptyline (ELAVIL) 25 MG tablet Take 1 tablet (25 mg total) by mouth at bedtime. 30 tablet 2  . aspirin 81 MG EC tablet Take 81 mg by mouth daily.      . Calcium Carbonate-Vitamin D (CALCIUM 600+D) 600-400 MG-UNIT per tablet Take 1 tablet by mouth 2 (two) times daily.      . chlorzoxazone (PARAFON) 500 MG tablet Take 500 mg by mouth 3 (three) times daily as needed for muscle spasms.    . cholecalciferol (VITAMIN D) 1000 UNITS tablet Take 3,000 Units by mouth daily.    . cyclobenzaprine (FLEXERIL) 5 MG tablet Take 1 tablet (5 mg total) by mouth 3 (three) times daily as needed for muscle spasms. 20 tablet 0  . estradiol (ESTRACE) 0.1 MG/GM vaginal cream Place 2 g vaginally. Two times a week.     . isometheptene-acetaminophen-dichloralphenazone (MIDRIN) 65-325-100 MG  capsule Take 1-2 capsules by mouth 4 (four) times daily as needed for migraine. Maximum 5 capsules in 12 hours for migraine headaches, 8 capsules in 24 hours for tension headaches.    Marland Kitchen omeprazole-sodium bicarbonate (ZEGERID) 40-1100 MG per capsule TAKE 1 CAPSULE BY MOUTH 2 TIMES DAILY. 60 capsule 4  . ondansetron (ZOFRAN ODT) 4 MG disintegrating tablet Take 1 tablet (4 mg total) by mouth every 8 (eight) hours as needed for nausea or vomiting. 20 tablet 0  . promethazine (PHENERGAN) 25 MG tablet Take 25 mg by mouth every 4 (four) hours as needed for nausea.    Marland Kitchen topiramate (TOPAMAX) 25 MG tablet Take 25 mg by mouth at bedtime. May increase by 1 tablet each week until you are taking 4 tablets at bedtime if needed.     No current facility-administered medications on file prior to visit.    BP 120/82 mmHg  Pulse 74  Temp(Src) 98.1 F (36.7 C) (Oral)  Resp 16  Ht 5' (1.524 m)  Wt 155 lb 12.8 oz (70.67 kg)  BMI 30.43 kg/m2  SpO2 97%       Objective:   Physical Exam  Constitutional: She appears well-developed and well-nourished. No distress.  Eyes: Conjunctivae and EOM are normal. Pupils are equal, round, and reactive to light. Right eye exhibits no discharge. Left eye exhibits no discharge. No scleral icterus.  Neck: Normal range of motion. Neck supple. No tracheal deviation present. No thyromegaly present.  Cardiovascular: Normal rate, regular rhythm and normal heart sounds.  Exam reveals no gallop and no friction rub.   No murmur heard. Pulmonary/Chest: Effort normal and breath sounds normal. No respiratory distress. She has no wheezes. She has no rales. She exhibits no tenderness.  Lymphadenopathy:    She has no cervical adenopathy.  Skin: Skin is warm. She is not diaphoretic.  Psychiatric: She has a normal mood and affect. Her behavior is normal. Judgment and thought content normal.        Assessment & Plan:  For training/orientation purposes patient was seen along with Lorane Gell NP. I have personally seen and examined patient and agree with Frederico Hamman NP's assessment and plan.

## 2013-12-29 NOTE — Patient Instructions (Signed)
Please complete lab work prior to leaving. Follow up in 3 months.  

## 2013-12-31 ENCOUNTER — Encounter: Payer: Self-pay | Admitting: Family

## 2014-04-20 ENCOUNTER — Encounter: Payer: Self-pay | Admitting: Family

## 2014-04-20 ENCOUNTER — Ambulatory Visit (INDEPENDENT_AMBULATORY_CARE_PROVIDER_SITE_OTHER): Payer: 59 | Admitting: Family

## 2014-04-20 VITALS — BP 124/88 | HR 59 | Temp 98.2°F | Resp 16 | Ht 60.0 in | Wt 155.6 lb

## 2014-04-20 DIAGNOSIS — R739 Hyperglycemia, unspecified: Secondary | ICD-10-CM

## 2014-04-20 DIAGNOSIS — E785 Hyperlipidemia, unspecified: Secondary | ICD-10-CM

## 2014-04-20 DIAGNOSIS — I10 Essential (primary) hypertension: Secondary | ICD-10-CM

## 2014-04-20 DIAGNOSIS — E559 Vitamin D deficiency, unspecified: Secondary | ICD-10-CM

## 2014-04-20 LAB — LIPID PANEL
Cholesterol: 224 mg/dL — ABNORMAL HIGH (ref 0–200)
HDL: 45.3 mg/dL (ref >39.00)
NonHDL: 178.7
Total CHOL/HDL Ratio: 5
Triglycerides: 206 mg/dL — ABNORMAL HIGH (ref 0.0–149.0)
VLDL: 41.2 mg/dL — ABNORMAL HIGH (ref 0.0–40.0)

## 2014-04-20 LAB — LDL CHOLESTEROL, DIRECT: Direct LDL: 142 mg/dL

## 2014-04-20 LAB — VITAMIN D 25 HYDROXY (VIT D DEFICIENCY, FRACTURES): VITD: 14.06 ng/mL — ABNORMAL LOW (ref 30.00–100.00)

## 2014-04-20 NOTE — Patient Instructions (Signed)
Please complete lab work prior to leaving. Follow up in 3 months. You may use tylenol as needed for joint pain.

## 2014-04-20 NOTE — Progress Notes (Signed)
Subjective:    Patient ID: Whitney Neal, female    DOB: 02/25/1960, 54 y.o.   MRN: [id number]  HPI  Whitney Neal is a 54 yr old female who presents today for follow up.  Patient is currently maintained on the following medications for blood pressure: none Patient reports good compliance with blood pressure medications. Patient denies chest pain, shortness of breath or swelling. Last 3 blood pressure readings in our office are as follows: BP Readings from Last 3 Encounters:  04/20/14 124/88  12/29/13 120/82  08/31/13 126/84   Notes that her readings are sometimes higher at home.    Hyperglycemia-  Lab Results  Component Value Date   HGBA1C 6.0 12/29/2013   HGBA1C 6.1* 09/01/2013   HGBA1C 6.2* 06/01/2013   Lab Results  Component Value Date   LDLCALC 85 06/01/2013   CREATININE 0.7 12/29/2013   Hyperlipidemia- Patient is currently maintained on the following medication for hyperlipidemia: pravastatin.  Last lipid panel as follows:  Lab Results  Component Value Date   CHOL 158 06/01/2013   HDL 51 06/01/2013   LDLCALC 85 06/01/2013   TRIG 110 06/01/2013   CHOLHDL 3.1 06/01/2013    Patient denies myalgia. Patient reports good compliance with low fat/low cholesterol diet.    Review of Systems Reports joint pain hands/ similar pain in past with vit D deficiency     Past Medical History  Diagnosis Date  . Cervical lymphadenopathy     left  . Ulcer 2010  . Hypercholesteremia   . Urinary incontinence   . GERD (gastroesophageal reflux disease)   . Hypertension   . Constipation   . Hyperplastic colonic polyp     history of   . Cervical strain   . Atypical chest pain   . Osteopenia   . Barrett's esophagus     history of   . Vitamin D deficiency   . Hyperglycemia     a1c 6.3 7/12    History   Social History  . Marital Status: Married    Spouse Name: N/A  . Number of Children: 2  . Years of Education: N/A   Occupational History  . [employer]   Social History Main Topics  . Smoking status: Never Smoker   . Smokeless tobacco: Not on file  . Alcohol Use: No  . Drug Use: No  . Sexual Activity: Not on file   Other Topics Concern  . Not on file   Social History Narrative   Reviewed history from 07/04/2009 and no changes required.   Married- is guardian for two grandchildren ages 2 and 38.   Never Smoked   Alcohol use-no   Regular exercise-no   Works in housekeeping at [employer]          Past Surgical History  Procedure Laterality Date  . Bladder surgery      bladder mesh surgery 2010- Dr. Rhodia Albright  . Colonoscopy w/ polypectomy  2010  . Abdominal hysterectomy  2009    Dr. Bethann Goo    Family History  Problem Relation Age of Onset  . Diabetes Sister   . Colon cancer Father   . Pulmonary embolism Mother     deceased  . Hypertension Brother   . Hypertension Sister   . Hypertension Father   . Hypertension Mother   . Cancer Paternal Grandmother     oral  . Thyroid disease Sister     1/2 sister thyroid problem  . Healthy Daughter   .  Heart attack Brother 11  . Heart attack Father 50    Allergies  Allergen Reactions  . Codeine     REACTION: headache, visual disturbance    Current Outpatient Prescriptions on File Prior to Visit  Medication Sig Dispense Refill  . amitriptyline (ELAVIL) 25 MG tablet Take 1 tablet (25 mg total) by mouth at bedtime. 30 tablet 2  . aspirin 81 MG EC tablet Take 81 mg by mouth daily.      . Calcium Carbonate-Vitamin D (CALCIUM 600+D) 600-400 MG-UNIT per tablet Take 1 tablet by mouth 2 (two) times daily.      . chlorzoxazone (PARAFON) 500 MG tablet Take 500 mg by mouth 3 (three) times daily as needed for muscle spasms.    . cholecalciferol (VITAMIN D) 1000 UNITS tablet Take 3,000 Units by mouth daily.    . cyclobenzaprine (FLEXERIL) 5 MG tablet Take 1 tablet (5 mg total) by mouth 3 (three) times daily as needed for muscle spasms. 20 tablet 0  . estradiol (ESTRACE) 0.1  MG/GM vaginal cream Place 2 g vaginally. Two times a week.     . isometheptene-acetaminophen-dichloralphenazone (MIDRIN) 65-325-100 MG capsule Take 1-2 capsules by mouth 4 (four) times daily as needed for migraine. Maximum 5 capsules in 12 hours for migraine headaches, 8 capsules in 24 hours for tension headaches.    Marland Kitchen omeprazole-sodium bicarbonate (ZEGERID) 40-1100 MG per capsule TAKE 1 CAPSULE BY MOUTH 2 TIMES DAILY. 60 capsule 4  . ondansetron (ZOFRAN ODT) 4 MG disintegrating tablet Take 1 tablet (4 mg total) by mouth every 8 (eight) hours as needed for nausea or vomiting. 20 tablet 0  . pravastatin (PRAVACHOL) 40 MG tablet TAKE 1 TABLET BY MOUTH AT BEDTIME 90 tablet 1  . promethazine (PHENERGAN) 25 MG tablet Take 25 mg by mouth every 4 (four) hours as needed for nausea.    Marland Kitchen topiramate (TOPAMAX) 25 MG tablet Take 25 mg by mouth at bedtime. May increase by 1 tablet each week until you are taking 4 tablets at bedtime if needed.     No current facility-administered medications on file prior to visit.    BP 124/88 mmHg  Pulse 59  Temp(Src) 98.2 F (36.8 C) (Oral)  Resp 16  Ht 5' (1.524 m)  Wt 155 lb 9.6 oz (70.58 kg)  BMI 30.39 kg/m2  SpO2 97%    Objective:   Physical Exam  Constitutional: She appears well-developed and well-nourished.  HENT:  Head: Normocephalic and atraumatic.  Cardiovascular: Normal rate, regular rhythm and normal heart sounds.   No murmur heard. Pulmonary/Chest: Effort normal and breath sounds normal. No respiratory distress. She has no wheezes.  Musculoskeletal: She exhibits no edema.  Psychiatric: She has a normal mood and affect. Her behavior is normal. Judgment and thought content normal.          Assessment & Plan:

## 2014-04-20 NOTE — Assessment & Plan Note (Signed)
Check vit d level 

## 2014-04-20 NOTE — Assessment & Plan Note (Signed)
BP stable in office off of meds.

## 2014-04-20 NOTE — Assessment & Plan Note (Signed)
Obtain follow up flp, continue statin.

## 2014-04-20 NOTE — Assessment & Plan Note (Signed)
Reinforced importance of diet/exercise.

## 2014-04-20 NOTE — Progress Notes (Signed)
Pre visit review using our clinic review tool, if applicable. No additional management support is needed unless otherwise documented below in the visit note. 

## 2014-04-23 ENCOUNTER — Other Ambulatory Visit: Payer: Self-pay | Admitting: Family

## 2014-04-23 DIAGNOSIS — E559 Vitamin D deficiency, unspecified: Secondary | ICD-10-CM

## 2014-04-23 MED ORDER — ATORVASTATIN CALCIUM 40 MG PO TABS: 40.0000 mg | ORAL_TABLET | Freq: Every day | ORAL | Status: DC

## 2014-04-23 MED ORDER — VITAMIN D (ERGOCALCIFEROL) 1.25 MG (50000 UNIT) PO CAPS: 50000.0000 [IU] | ORAL_CAPSULE | ORAL | Status: DC

## 2014-04-23 NOTE — Telephone Encounter (Signed)
Cholesterol has worsened. Stop pravastatin, start atorvastatin. Vit d is low. Stop daily vit d, start weekly at 50000. Repeat flp and vit d in 12 weeks.

## 2014-04-23 NOTE — Telephone Encounter (Signed)
Notified pt and she voices understanding. Lab appt scheduled for 07/23/14 at 2pm. Future order entered.

## 2014-06-27 ENCOUNTER — Encounter (HOSPITAL_COMMUNITY): Payer: Self-pay | Admitting: Emergency Medicine

## 2014-06-27 ENCOUNTER — Emergency Department (HOSPITAL_COMMUNITY)
Admission: EM | Admit: 2014-06-27 | Discharge: 2014-06-27 | Disposition: A | Discharge status: Home / Self Care | Payer: 59 | Attending: Emergency Medicine | Admitting: Emergency Medicine

## 2014-06-27 ENCOUNTER — Emergency Department (HOSPITAL_COMMUNITY): Payer: 59

## 2014-06-27 DIAGNOSIS — Z7982 Long term (current) use of aspirin: Secondary | ICD-10-CM | POA: Diagnosis not present

## 2014-06-27 DIAGNOSIS — E559 Vitamin D deficiency, unspecified: Secondary | ICD-10-CM | POA: Diagnosis not present

## 2014-06-27 DIAGNOSIS — G40909 Epilepsy, unspecified, not intractable, without status epilepticus: Secondary | ICD-10-CM | POA: Insufficient documentation

## 2014-06-27 DIAGNOSIS — E780 Pure hypercholesterolemia: Secondary | ICD-10-CM | POA: Diagnosis not present

## 2014-06-27 DIAGNOSIS — K219 Gastro-esophageal reflux disease without esophagitis: Secondary | ICD-10-CM | POA: Diagnosis not present

## 2014-06-27 DIAGNOSIS — M25572 Pain in left ankle and joints of left foot: Secondary | ICD-10-CM | POA: Diagnosis not present

## 2014-06-27 DIAGNOSIS — I10 Essential (primary) hypertension: Secondary | ICD-10-CM | POA: Insufficient documentation

## 2014-06-27 DIAGNOSIS — Z79899 Other long term (current) drug therapy: Secondary | ICD-10-CM | POA: Insufficient documentation

## 2014-06-27 HISTORY — DX: Migraine, unspecified, not intractable, without status migrainosus: G43.909

## 2014-06-27 MED ORDER — IBUPROFEN 600 MG PO TABS: 600.0000 mg | ORAL_TABLET | Freq: Four times a day (QID) | ORAL | Status: DC | PRN

## 2014-06-27 NOTE — ED Provider Notes (Signed)
CSN: [id number]     Arrival date & time 06/27/14  1147 History  This chart was scribed for Whitney Bailiff, PA-C working with Lacretia Leigh, MD by Randa Evens, ED Scribe. This patient was seen in room WTR7/WTR7 and the patient's care was started at 12:30 PM.      Chief Complaint  Patient presents with  . Ankle Pain    pain and swlling in l/ankle   The history is provided by the patient. No language interpreter was used.   HPI Comments: Whitney Neal is a 54 y.o. female who presents to the Emergency Department complaining of new worsening left ankle pain that began 4 days ago. Pt states she has associated swelling. Pt states that she walks a lot while at work up to 12 hrs a shift. Pt states that the pain is worse when bearing weight and walking. Pt states she has applied ice and kept the leg elevated with no relief.  Pt denies injury or fall. Pt denies fever, nausea, vomiting, numbness or tingling.   Past Medical History  Diagnosis Date  . Cervical lymphadenopathy     left  . Ulcer 2010  . Hypercholesteremia   . Urinary incontinence   . GERD (gastroesophageal reflux disease)   . Hypertension   . Constipation   . Hyperplastic colonic polyp     history of   . Cervical strain   . Atypical chest pain   . Osteopenia   . Barrett's esophagus     history of   . Vitamin D deficiency   . Hyperglycemia     a1c 6.3 7/12  . Migraine    Past Surgical History  Procedure Laterality Date  . Bladder surgery      bladder mesh surgery 2010- Dr. Rhodia Albright  . Colonoscopy w/ polypectomy  2010  . Abdominal hysterectomy  2009    Dr. Bethann Goo   Family History  Problem Relation Age of Onset  . Diabetes Sister   . Colon cancer Father   . Hypertension Father   . Heart attack Father 57  . Pulmonary embolism Mother     deceased  . Hypertension Mother   . Hypertension Brother   . Hypertension Sister   . Cancer Paternal Grandmother     oral  . Thyroid disease Sister     1/2 sister thyroid problem  .  Healthy Daughter   . Heart attack Brother 42   History  Substance Use Topics  . Smoking status: Never Smoker   . Smokeless tobacco: Not on file  . Alcohol Use: No   OB History    No data available      Review of Systems  Constitutional: Negative for fever.  Gastrointestinal: Negative for nausea and vomiting.  Musculoskeletal: Positive for joint swelling and arthralgias. Negative for gait problem.     Allergies  Codeine  Home Medications   Prior to Admission medications   Medication Sig Start Date End Date Taking? Authorizing Provider  amitriptyline (ELAVIL) 25 MG tablet Take 1 tablet (25 mg total) by mouth at bedtime. 05/23/12   Debbrah Alar, NP  aspirin 81 MG EC tablet Take 81 mg by mouth daily.      Historical Provider, MD  atorvastatin (LIPITOR) 40 MG tablet Take 1 tablet (40 mg total) by mouth daily. 04/23/14   Debbrah Alar, NP  Calcium Carbonate-Vitamin D (CALCIUM 600+D) 600-400 MG-UNIT per tablet Take 1 tablet by mouth 2 (two) times daily.      Historical Provider, MD  chlorzoxazone (PARAFON) 500 MG tablet Take 500 mg by mouth 3 (three) times daily as needed for muscle spasms.    Historical Provider, MD  cyclobenzaprine (FLEXERIL) 5 MG tablet Take 1 tablet (5 mg total) by mouth 3 (three) times daily as needed for muscle spasms. 11/12/11   Debbrah Alar, NP  estradiol (ESTRACE) 0.1 MG/GM vaginal cream Place 2 g vaginally. Two times a week.     Historical Provider, MD  ibuprofen (ADVIL,MOTRIN) 600 MG tablet Take 1 tablet (600 mg total) by mouth every 6 (six) hours as needed. 06/27/14   Whitney Bailiff, PA-C  isometheptene-acetaminophen-dichloralphenazone (MIDRIN) 65-325-100 MG capsule Take 1-2 capsules by mouth 4 (four) times daily as needed for migraine. Maximum 5 capsules in 12 hours for migraine headaches, 8 capsules in 24 hours for tension headaches.    Historical Provider, MD  omeprazole-sodium bicarbonate (ZEGERID) 40-1100 MG per capsule TAKE 1 CAPSULE BY MOUTH 2  TIMES DAILY. 09/18/13   Debbrah Alar, NP  ondansetron (ZOFRAN ODT) 4 MG disintegrating tablet Take 1 tablet (4 mg total) by mouth every 8 (eight) hours as needed for nausea or vomiting. 06/01/13   Debbrah Alar, NP  promethazine (PHENERGAN) 25 MG tablet Take 25 mg by mouth every 4 (four) hours as needed for nausea.    Historical Provider, MD  topiramate (TOPAMAX) 25 MG tablet Take 25 mg by mouth at bedtime. May increase by 1 tablet each week until you are taking 4 tablets at bedtime if needed.    Historical Provider, MD  Vitamin D, Ergocalciferol, (DRISDOL) 50000 UNITS CAPS capsule Take 1 capsule (50,000 Units total) by mouth every 7 (seven) days. 04/23/14   Debbrah Alar, NP   BP 133/89 mmHg  Pulse 73  Temp(Src) 98.2 F (36.8 C)  Resp 16  Wt 152 lb (68.947 kg)  SpO2 97%   Physical Exam  Constitutional: She is oriented to person, place, and time. She appears well-developed and well-nourished. No distress.  HENT:  Head: Normocephalic and atraumatic.  Eyes: Conjunctivae and EOM are normal.  Neck: Neck supple. No tracheal deviation present.  Cardiovascular: Normal rate.   Pulses:      Dorsalis pedis pulses are 2+ on the right side, and 2+ on the left side.  Pulmonary/Chest: Effort normal. No respiratory distress.  Musculoskeletal: Normal range of motion.  Mild swelling to left lateral malleolus. No erythema, ecchymosis, warmth, deformity. Full active and passive range of motion without pain. No ankle instability. cap refill less than 2 seconds, distal sensation intact. Motor strength 5 out of 5 at hip, knee, ankle.  Neurological: She is alert and oriented to person, place, and time.  Skin: Skin is warm and dry.  Psychiatric: She has a normal mood and affect. Her behavior is normal.  Nursing note and vitals reviewed.   ED Course  Procedures (including critical care time) DIAGNOSTIC STUDIES: Oxygen Saturation is 100% on RA, normal by my interpretation.    COORDINATION OF  CARE: 12:56 PM-Discussed treatment plan with pt at bedside and pt agreed to plan.     Labs Review Labs Reviewed - No data to display  Imaging Review Dg Ankle Complete Left  06/27/2014   CLINICAL DATA:  LEFT ankle pain for 5 days.  No injury.  EXAM: LEFT ANKLE COMPLETE - 3+ VIEW  COMPARISON:  None.  FINDINGS: Anatomic alignment. Ankle mortise congruent. Talar dome intact. No fracture. No destructive osseous lesions. Cornuate navicular incidentally noted.  IMPRESSION: Negative.   Electronically Signed   By: Dereck Ligas  M.D.   On: 06/27/2014 12:48     EKG Interpretation None      MDM   Final diagnoses:  Ankle pain, left   Patient here with atraumatic ankle pain, however states she has been overusing it, working more than typically. There is some mild swelling noted, however it appears nonspecific based on negative radiographs and exam. Patient afebrile, hemodynamically stable and in no acute distress. There is no concern for septic arthritis, there is no concern for gouty arthritis. Likely inflammatory process from overuse, we'll place patient in Cam Walker boot and strongly encouraged her to follow up with orthopedics. Return precautions discussed with patient, she verbalizes understanding and agreement of this plan.  I personally performed the services described in this documentation, which was scribed in my presence. The recorded information has been reviewed and is accurate.  BP 133/89 mmHg  Pulse 73  Temp(Src) 98.2 F (36.8 C)  Resp 16  Wt 152 lb (68.947 kg)  SpO2 97%  Signed,  Whitney Bailiff, PA-C 2:58 PM     Whitney Bailiff, PA-C 06/27/14 1458  Lacretia Leigh, MD 06/28/14 1444

## 2014-06-27 NOTE — Discharge Instructions (Signed)

## 2014-06-27 NOTE — ED Notes (Signed)
Pt stated that her l/ankle began to swell on Wednesday night. Increased pain and swelling over the last 24 hours. Swelling noted on l/side of l/ankle. Denies trauma.

## 2014-07-08 ENCOUNTER — Ambulatory Visit: Payer: 59 | Attending: Orthopaedic Surgery | Admitting: Physical Therapy

## 2014-07-08 DIAGNOSIS — R262 Difficulty in walking, not elsewhere classified: Secondary | ICD-10-CM | POA: Diagnosis not present

## 2014-07-08 DIAGNOSIS — R609 Edema, unspecified: Secondary | ICD-10-CM | POA: Insufficient documentation

## 2014-07-08 DIAGNOSIS — M6281 Muscle weakness (generalized): Secondary | ICD-10-CM | POA: Insufficient documentation

## 2014-07-08 DIAGNOSIS — M25652 Stiffness of left hip, not elsewhere classified: Secondary | ICD-10-CM | POA: Diagnosis not present

## 2014-07-08 DIAGNOSIS — M25672 Stiffness of left ankle, not elsewhere classified: Secondary | ICD-10-CM | POA: Insufficient documentation

## 2014-07-08 DIAGNOSIS — M25572 Pain in left ankle and joints of left foot: Secondary | ICD-10-CM | POA: Insufficient documentation

## 2014-07-08 NOTE — Patient Instructions (Signed)
  Ankle Alphabet   Using left ankle and foot only, trace the letters of the alphabet. Perform A to Z. Repeat _1___ times per set. Do ____ sets per session. Do __2-3__ sessions per day.  http://orth.exer.us/16   Copyright  VHI. All rights reserved.    Ankle Circles   Slowly rotate right foot and ankle clockwise then counterclockwise. Gradually increase range of motion. Avoid pain. Circle __10__ times each direction per set. Do ____ sets per session. Do 2-3____ sessions per day.  http://orth.exer.us/30   Copyright  VHI. All rights reserved.    ROM: Plantar / Dorsiflexion   With left leg relaxed, gently flex and extend ankle. Move through full range of motion. Avoid pain. Repeat __20__ times per set. Do ____ sets per session. Do __3-4__ sessions per day.  http://orth.exer.us/34   Copyright  VHI. All rights reserved.    ROM: Inversion / Eversion   With left leg relaxed, gently turn ankle and foot in and out. Move through full range of motion. Avoid pain. Repeat _20___ times per set. Do ____ sets per session. Do _3-4___ sessions per day.  http://orth.exer.us/36   Copyright  VHI. All rights reserved.    Achilles / Gastroc, Standing   Stand, right foot behind, heel on floor and turned slightly out, leg straight, forward leg bent. Move hips forward. Hold _30__ seconds. Repeat _3__ times per session. Do _3__ sessions per day.  Sit and Reach - Foot Supported   Remove leg supports. Place one foot on table. Straighten leg and attempt to keep it straight. Hold _30__ seconds. Repeat _3__ times each leg, alternating. Do _2__ sessions per day.   Copyright  VHI. All rights reserved.   Madelyn Flavors, PT 07/08/2014 3:18 PM [place] [phone]

## 2014-07-08 NOTE — Therapy (Signed)
[facility] [address]  [place], [place], [address] Phone: [phone]   Fax:  [phone]  Physical Therapy Evaluation  Patient Details  Name: Whitney Neal MRN: [id number] Date of Birth: 1960-04-10 Referring Provider:  Leandrew Koyanagi, MD  Encounter Date: 07/08/2014      PT End of Session - 07/08/14 1441    Visit Number 1   Number of Visits 12   Date for PT Re-Evaluation 08/19/14   PT Start Time 1441   PT Stop Time 1524   PT Time Calculation (min) 43 min   Activity Tolerance Patient tolerated treatment well   Behavior During Therapy [facility] for tasks assessed/performed      Past Medical History  Diagnosis Date  . Cervical lymphadenopathy     left  . Ulcer 2010  . Hypercholesteremia   . Urinary incontinence   . GERD (gastroesophageal reflux disease)   . Hypertension   . Constipation   . Hyperplastic colonic polyp     history of   . Cervical strain   . Atypical chest pain   . Osteopenia   . Barrett's esophagus     history of   . Vitamin D deficiency   . Hyperglycemia     a1c 6.3 7/12  . Migraine     Past Surgical History  Procedure Laterality Date  . Bladder surgery      bladder mesh surgery 2010- Dr. Rhodia Albright  . Colonoscopy w/ polypectomy  2010  . Abdominal hysterectomy  2009    Dr. Bethann Goo    There were no vitals filed for this visit.  Visit Diagnosis:  Stiffness of left ankle joint - Plan: PT plan of care cert/re-cert  Pain in left ankle - Plan: PT plan of care cert/re-cert  Stiffness of left hip joint - Plan: PT plan of care cert/re-cert  Generalized muscle weakness - Plan: PT plan of care cert/re-cert  Edema - Plan: PT plan of care cert/re-cert  Difficulty walking - Plan: PT plan of care cert/re-cert      Subjective Assessment - 07/08/14 1444    Subjective Patient works at [employer]. Her foot had started hurting at work for a few days and then one night she couldn't walk  when she got up from bed. She went to ER on Mothers Day and they put her in a CAM boot.  She is off work until  07/15/14.  Going back to Dr. Erlinda Hong 07/29/14.    How long can you walk comfortably? 5 minutes   Patient Stated Goals to get back to work and get rid of pain   Currently in Pain? Yes   Pain Score 2    Pain Location Ankle   Pain Orientation Left;Lateral   Pain Descriptors / Indicators Pins and needles   Pain Type Acute pain   Pain Onset 1 to 4 weeks ago   Pain Frequency Intermittent   Aggravating Factors  walking   Pain Relieving Factors elevate   Effect of Pain on Daily Activities cannot work as Electrical engineer            [facility] PT Assessment - 07/08/14 0001    Assessment   Medical Diagnosis left peroneal tendinitis, sprain   Onset Date 06/27/14   Next MD Visit 07/29/14   Prior Therapy none   Precautions   Required Braces or Orthoses --  CAM boot   Restrictions   Weight Bearing Restrictions No   Balance Screen  Has the patient fallen in the past 6 months No   Has the patient had a decrease in activity level because of a fear of falling?  No   Is the patient reluctant to leave their home because of a fear of falling?  No   Prior Function   Level of Independence Independent with basic ADLs   Vocation Full time employment   Vocation Requirements nursing tech   Observation/Other Assessments   Observations mild atrophy left gastroc   ROM / Strength   AROM / PROM / Strength AROM;Strength   AROM   Overall AROM Comments marked tightness bil HS   AROM Assessment Site Ankle   Right/Left Ankle Left   Left Ankle Dorsiflexion -17  Passive 7   Left Ankle Plantar Flexion 45  60   Left Ankle Inversion 38   Left Ankle Eversion 32  40   Strength   Overall Strength Comments left hip flex, abd 4/5, ext 4-/5; Rt hip 5/5; left knee flex 4-/5, ext 5/5   Strength Assessment Site Ankle   Right/Left Ankle Left   Left Ankle Dorsiflexion 5/5   Left Ankle Plantar Flexion 4+/5   Left Ankle  Inversion 5/5   Left Ankle Eversion 4+/5                   [facility] Adult PT Treatment/Exercise - 07/08/14 0001    Exercises   Exercises Knee/Hip;Ankle   Knee/Hip Exercises: Stretches   Active Hamstring Stretch 1 rep;30 seconds  seated   Gastroc Stretch Limitations 1x 30 sec   Modalities   Modalities Iontophoresis   Iontophoresis   Type of Iontophoresis Dexamethasone   Location left lateral malleolus   Dose 1.0 cc   Time 6 hour patch   Ankle Exercises: Seated   ABC's 1 rep   Ankle Circles/Pumps AROM;10 reps   Towel Inversion/Eversion --  10 reps no towel                PT Education - 07/08/14 1529    Education provided Yes   Education Details hep: ankle rom, gastroc and HS stretch   Person(s) Educated Patient   Methods Explanation;Demonstration;Handout   Comprehension Verbalized understanding;Returned demonstration          PT Short Term Goals - 07/08/14 1544    PT SHORT TERM GOAL #1   Title I with initial hep   Time 2   Period Weeks   Status New   PT SHORT TERM GOAL #2   Title demo improved left ankle DF to -5 degrees or better   Time 2   Period Weeks   Status New           PT Long Term Goals - 07/08/14 1545    PT LONG TERM GOAL #1   Title I with advanced HEP   Time 6   Period Weeks   Status New   PT LONG TERM GOAL #2   Title amb with a normal gait patttern and pain 1/10 or less   Time 6   Period Weeks   Status New   PT LONG TERM GOAL #3   Title demo 4+/5 or better LLE strength   Time 6   Period Weeks   Status New   PT LONG TERM GOAL #4   Title demo 5 degrees left ankle DF or better   Time 6   Period Weeks   Status New  Plan - 07/08/14 1529    Clinical Impression Statement 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 She has pain with ambulation and presently wears a CAM boot until her RTD. She has decreased ROM and strength in the ankle with  mild edema. She also has marked tightness in bil HS and LLE weakness.    Pt will benefit from skilled therapeutic intervention in order to improve on the following deficits Abnormal gait;Decreased range of motion;Difficulty walking;Impaired flexibility;Increased edema;Pain;Decreased mobility;Decreased strength   PT Frequency 2x / week   PT Duration 6 weeks   PT Treatment/Interventions ADLs/Self Care Home Management;Moist Heat;Patient/family education;Therapeutic exercise;Passive range of motion;Ultrasound;Gait training;Balance training;Manual techniques;Cryotherapy;Neuromuscular re-education;Electrical Stimulation;Other (comment)  iontophoresis 4mg /ml dexamethasone   PT Next Visit Plan rom, LLE strength and flexibility, assess ionto   Consulted and Agree with Plan of Care Patient         Problem List Patient Active Problem List   Diagnosis Date Noted  . Chest pain 06/15/2013  . Myalgia 06/30/2012  . Paresthesia 11/12/2011  . Migraine 10/01/2011  . Numbness and tingling in left hand 01/05/2011  . Hyperglycemia   . Essential hypertension 01/25/2010  . GERD 01/25/2010  . COLONIC POLYPS, HYPERPLASTIC, HX OF 01/25/2010  . CERVICAL STRAIN 11/23/2009  . Hyperlipidemia 09/26/2009  . OSTEOPENIA 07/11/2009  . GASTRIC ULCER, HX OF 07/11/2009  . BARRETT'S ESOPHAGUS, HX OF 07/11/2009  . Vitamin D deficiency 07/04/2009  . CHEST PAIN, ATYPICAL, HX OF 07/04/2009   Madelyn Flavors PT  07/08/2014, 4:38 PM  [facility] [address]  [place], [place], [address] Phone: [phone]   Fax:  [phone]

## 2014-07-09 ENCOUNTER — Ambulatory Visit: Payer: 59 | Admitting: Rehabilitation

## 2014-07-14 ENCOUNTER — Ambulatory Visit: Payer: 59 | Admitting: Rehabilitation

## 2014-07-14 DIAGNOSIS — R609 Edema, unspecified: Secondary | ICD-10-CM

## 2014-07-14 DIAGNOSIS — M25672 Stiffness of left ankle, not elsewhere classified: Secondary | ICD-10-CM

## 2014-07-14 DIAGNOSIS — M6281 Muscle weakness (generalized): Secondary | ICD-10-CM

## 2014-07-14 DIAGNOSIS — R262 Difficulty in walking, not elsewhere classified: Secondary | ICD-10-CM

## 2014-07-14 DIAGNOSIS — M25652 Stiffness of left hip, not elsewhere classified: Secondary | ICD-10-CM

## 2014-07-14 DIAGNOSIS — M25572 Pain in left ankle and joints of left foot: Secondary | ICD-10-CM

## 2014-07-14 NOTE — Therapy (Signed)
[facility] [address]  [place], [place], [address] Phone: [phone]   Fax:  [phone]  Physical Therapy Treatment  Patient Details  Name: Whitney Neal MRN: [id number] Date of Birth: [birth year] Referring Provider:  Leandrew Koyanagi, MD  Encounter Date: 07/14/2014      PT End of Session - 07/14/14 1446    Visit Number 2   Number of Visits 12   Date for PT Re-Evaluation 08/19/14   PT Start Time 1446   PT Stop Time 1525   PT Time Calculation (min) 39 min      Past Medical History  Diagnosis Date  . Cervical lymphadenopathy     left  . Ulcer 2010  . Hypercholesteremia   . Urinary incontinence   . GERD (gastroesophageal reflux disease)   . Hypertension   . Constipation   . Hyperplastic colonic polyp     history of   . Cervical strain   . Atypical chest pain   . Osteopenia   . Barrett's esophagus     history of   . Vitamin D deficiency   . Hyperglycemia     a1c 6.3 7/12  . Migraine     Past Surgical History  Procedure Laterality Date  . Bladder surgery      bladder mesh surgery 2010- Dr. Rhodia Albright  . Colonoscopy w/ polypectomy  2010  . Abdominal hysterectomy  2009    Dr. Bethann Goo    There were no vitals filed for this visit.  Visit Diagnosis:  Stiffness of left ankle joint  Pain in left ankle  Stiffness of left hip joint  Generalized muscle weakness  Edema  Difficulty walking      Subjective Assessment - 07/14/14 1447    Subjective Reports taking boot off last week and has been going great. States complaince with HEP and minimal pain. Returns to work tomorrow.    Currently in Pain? Yes   Pain Score 1    Pain Location Ankle   Pain Orientation Left;Lateral            [facility] PT Assessment - 07/14/14 1514    AROM   AROM Assessment Site Ankle   Right/Left Ankle Left   Left Ankle Dorsiflexion 9   Left Ankle Plantar Flexion 45   Left Ankle Inversion 38   Left Ankle Eversion 32                      [facility] Adult PT Treatment/Exercise - 07/14/14 1450    Exercises   Exercises Ankle   Knee/Hip Exercises: Stretches   Gastroc Stretch 2 reps;20 seconds   Soleus Stretch 2 reps;20 seconds   Knee/Hip Exercises: Aerobic   Stationary Bike Nustep level 5x5' UE/LE   Knee/Hip Exercises: Standing   Other Standing Knee Exercises marching on blue foam x10   Other Standing Knee Exercises tandam stance on blue foam   Ankle Exercises: Standing   SLS max hold x 5 bilateral on blue foam   Heel Raises 10 reps  on blue foam   Toe Raise 10 reps  on blue foam   Ankle Exercises: Seated   BAPS 10 reps  DF/PF, CW/CCW, IV/EV   Other Seated Ankle Exercises 4 way ankle green TB x10                PT Education - 07/14/14 1529    Education provided Yes   Education Details HEP: 4-way ankle,  tandam stance, SLS stance   Person(s) Educated Patient   Methods Explanation;Demonstration;Handout   Comprehension Verbalized understanding;Returned demonstration          PT Short Term Goals - 07/08/14 1544    PT SHORT TERM GOAL #1   Title I with initial hep   Time 2   Period Weeks   Status New   PT SHORT TERM GOAL #2   Title demo improved left ankle DF to -5 degrees or better   Time 2   Period Weeks   Status New           PT Long Term Goals - 07/08/14 1545    PT LONG TERM GOAL #1   Title I with advanced HEP   Time 6   Period Weeks   Status New   PT LONG TERM GOAL #2   Title amb with a normal gait patttern and pain 1/10 or less   Time 6   Period Weeks   Status New   PT LONG TERM GOAL #3   Title demo 4+/5 or better LLE strength   Time 6   Period Weeks   Status New   PT LONG TERM GOAL #4   Title demo 5 degrees left ankle DF or better   Time 6   Period Weeks   Status New               Plan - 07/14/14 1529    Clinical Impression Statement Good tolerance to all exericses with no complaint of pain. Noted slight pain with standing heel  raises but pt wanted to continue. Pt is very motivated to get back to prior function. Forgot to put patch on today, will resume at next visit.    PT Next Visit Plan rom, LLE strength and flexibility. Ionto if needed.    Consulted and Agree with Plan of Care Patient        Problem List Patient Active Problem List   Diagnosis Date Noted  . Chest pain 06/15/2013  . Myalgia 06/30/2012  . Paresthesia 11/12/2011  . Migraine 10/01/2011  . Numbness and tingling in left hand 01/05/2011  . Hyperglycemia   . Essential hypertension 01/25/2010  . GERD 01/25/2010  . COLONIC POLYPS, HYPERPLASTIC, HX OF 01/25/2010  . CERVICAL STRAIN 11/23/2009  . Hyperlipidemia 09/26/2009  . OSTEOPENIA 07/11/2009  . GASTRIC ULCER, HX OF 07/11/2009  . BARRETT'S ESOPHAGUS, HX OF 07/11/2009  . Vitamin D deficiency 07/04/2009  . CHEST PAIN, ATYPICAL, HX OF 07/04/2009    Barbette Hair, PTA 07/14/2014, 3:31 PM  [facility] [address]  [place], [place], [address] Phone: [phone]   Fax:  [phone]

## 2014-07-21 ENCOUNTER — Ambulatory Visit: Payer: 59 | Attending: Orthopaedic Surgery | Admitting: Rehabilitation

## 2014-07-21 DIAGNOSIS — M6281 Muscle weakness (generalized): Secondary | ICD-10-CM | POA: Diagnosis present

## 2014-07-21 DIAGNOSIS — R262 Difficulty in walking, not elsewhere classified: Secondary | ICD-10-CM | POA: Diagnosis present

## 2014-07-21 DIAGNOSIS — M25572 Pain in left ankle and joints of left foot: Secondary | ICD-10-CM | POA: Diagnosis present

## 2014-07-21 DIAGNOSIS — M25672 Stiffness of left ankle, not elsewhere classified: Secondary | ICD-10-CM | POA: Diagnosis not present

## 2014-07-21 DIAGNOSIS — M25652 Stiffness of left hip, not elsewhere classified: Secondary | ICD-10-CM | POA: Diagnosis present

## 2014-07-21 DIAGNOSIS — R609 Edema, unspecified: Secondary | ICD-10-CM | POA: Insufficient documentation

## 2014-07-21 NOTE — Therapy (Signed)
[facility] [address]  [place], [place], [address] Phone: [phone]   Fax:  [phone]  Physical Therapy Treatment  Patient Details  Name: Whitney Neal MRN: [id number] Date of Birth: 1960-04-12 Referring Provider:  Leandrew Koyanagi, MD  Encounter Date: 07/21/2014      PT End of Session - 07/21/14 1453    Visit Number 3   Number of Visits 12   Date for PT Re-Evaluation 08/19/14   PT Start Time 1450   PT Stop Time 1530   PT Time Calculation (min) 40 min      Past Medical History  Diagnosis Date  . Cervical lymphadenopathy     left  . Ulcer 2010  . Hypercholesteremia   . Urinary incontinence   . GERD (gastroesophageal reflux disease)   . Hypertension   . Constipation   . Hyperplastic colonic polyp     history of   . Cervical strain   . Atypical chest pain   . Osteopenia   . Barrett's esophagus     history of   . Vitamin D deficiency   . Hyperglycemia     a1c 6.3 7/12  . Migraine     Past Surgical History  Procedure Laterality Date  . Bladder surgery      bladder mesh surgery 2010- Dr. Rhodia Albright  . Colonoscopy w/ polypectomy  2010  . Abdominal hysterectomy  2009    Dr. Bethann Goo    There were no vitals filed for this visit.  Visit Diagnosis:  Stiffness of left ankle joint  Pain in left ankle  Stiffness of left hip joint  Generalized muscle weakness  Edema  Difficulty walking      Subjective Assessment - 07/21/14 1452    Subjective Reports she started back to work and notes increased pain following her shift.    Currently in Pain? Yes   Pain Score 6    Pain Location Ankle   Pain Orientation Left;Lateral            [facility] PT Assessment - 07/21/14 1504    AROM   AROM Assessment Site Ankle   Right/Left Ankle Left   Left Ankle Dorsiflexion 2   Left Ankle Plantar Flexion 45   Left Ankle Inversion 38   Left Ankle Eversion 32  noted pain                      [facility] Adult PT Treatment/Exercise - 07/21/14 1454    Knee/Hip Exercises: Aerobic   Stationary Bike level 1x6'   Knee/Hip Exercises: Standing   Other Standing Knee Exercises marching on blue foam 5"x10   Other Standing Knee Exercises alternate hip abduction on blue foam x10   Modalities   Modalities Ultrasound;Iontophoresis   Ultrasound   Ultrasound Location Lt lateral ankle    Ultrasound Parameters 3.3Hz , 20%, 0.8 w/cm2 x8'   Ultrasound Goals Edema;Pain   Iontophoresis   Type of Iontophoresis Dexamethasone   Location left lateral malleolus   Dose 1.0 cc   Time 6 hour patch   Ankle Exercises: Stretches   Gastroc Stretch 3 reps;20 seconds  blue rocker   Ankle Exercises: Supine   Isometrics PF/DF, IV/EV 3"x10 each way                  PT Short Term Goals - 07/21/14 1454    PT SHORT TERM GOAL #1   Title I with initial hep  Status Achieved   PT SHORT TERM GOAL #2   Title demo improved left ankle DF to -5 degrees or better   Status Achieved           PT Long Term Goals - 07/21/14 1454    PT LONG TERM GOAL #1   Title I with advanced HEP   Status On-going   PT LONG TERM GOAL #2   Title amb with a normal gait patttern and pain 1/10 or less   Status On-going   PT LONG TERM GOAL #3   Title demo 4+/5 or better LLE strength   Status On-going   PT LONG TERM GOAL #4   Title demo 5 degrees left ankle DF or better   Status On-going               Plan - 07/21/14 1506    Clinical Impression Statement Pt reports she was working a lot last night and feels a little more limited with ROM today. Still good improvements with DF AROM but less than last week.    PT Next Visit Plan rom, LLE strength and flexibility. Ionto if needed.         Problem List Patient Active Problem List   Diagnosis Date Noted  . Chest pain 06/15/2013  . Myalgia 06/30/2012  . Paresthesia 11/12/2011  . Migraine 10/01/2011  . Numbness and tingling in left hand 01/05/2011  .  Hyperglycemia   . Essential hypertension 01/25/2010  . GERD 01/25/2010  . COLONIC POLYPS, HYPERPLASTIC, HX OF 01/25/2010  . CERVICAL STRAIN 11/23/2009  . Hyperlipidemia 09/26/2009  . OSTEOPENIA 07/11/2009  . GASTRIC ULCER, HX OF 07/11/2009  . BARRETT'S ESOPHAGUS, HX OF 07/11/2009  . Vitamin D deficiency 07/04/2009  . CHEST PAIN, ATYPICAL, HX OF 07/04/2009    Barbette Hair, PTA 07/21/2014, 3:27 PM  [facility] [place], [place], [address] Phone: [phone]   Fax:  [phone]

## 2014-07-22 ENCOUNTER — Ambulatory Visit: Payer: 59 | Admitting: Rehabilitation

## 2014-07-22 DIAGNOSIS — R609 Edema, unspecified: Secondary | ICD-10-CM

## 2014-07-22 DIAGNOSIS — M25672 Stiffness of left ankle, not elsewhere classified: Secondary | ICD-10-CM | POA: Diagnosis not present

## 2014-07-22 DIAGNOSIS — M25572 Pain in left ankle and joints of left foot: Secondary | ICD-10-CM

## 2014-07-22 DIAGNOSIS — M6281 Muscle weakness (generalized): Secondary | ICD-10-CM

## 2014-07-22 DIAGNOSIS — R262 Difficulty in walking, not elsewhere classified: Secondary | ICD-10-CM

## 2014-07-22 DIAGNOSIS — M25652 Stiffness of left hip, not elsewhere classified: Secondary | ICD-10-CM

## 2014-07-22 NOTE — Therapy (Signed)
[facility] [address]  [place], [place], [address] Phone: [phone]   Fax:  [phone]  Physical Therapy Treatment  Patient Details  Name: Whitney Neal MRN: [id number] Date of Birth: 1960/11/13 Referring Provider:  Leandrew Koyanagi, MD  Encounter Date: 07/22/2014      PT End of Session - 07/22/14 1453    Visit Number 4   Number of Visits 12   Date for PT Re-Evaluation 08/19/14   PT Start Time 1450   PT Stop Time 1520   PT Time Calculation (min) 30 min      Past Medical History  Diagnosis Date  . Cervical lymphadenopathy     left  . Ulcer 2010  . Hypercholesteremia   . Urinary incontinence   . GERD (gastroesophageal reflux disease)   . Hypertension   . Constipation   . Hyperplastic colonic polyp     history of   . Cervical strain   . Atypical chest pain   . Osteopenia   . Barrett's esophagus     history of   . Vitamin D deficiency   . Hyperglycemia     a1c 6.3 7/12  . Migraine     Past Surgical History  Procedure Laterality Date  . Bladder surgery      bladder mesh surgery 2010- Dr. Rhodia Albright  . Colonoscopy w/ polypectomy  2010  . Abdominal hysterectomy  2009    Dr. Bethann Goo    There were no vitals filed for this visit.  Visit Diagnosis:  Stiffness of left ankle joint  Pain in left ankle  Stiffness of left hip joint  Generalized muscle weakness  Edema  Difficulty walking      Subjective Assessment - 07/22/14 1452    Subjective Reports feeling a little better than yesterday but still limping due to pain.    Currently in Pain? Yes   Pain Score 5    Pain Location Ankle   Pain Orientation Left;Lateral           [facility] Adult PT Treatment/Exercise - 07/22/14 1454    Knee/Hip Exercises: Aerobic   Stationary Bike level 1x6'   Modalities   Modalities Ultrasound;Iontophoresis   Ultrasound   Ultrasound Location Lt lateral ankle   Ultrasound Parameters 3.3Hz , 20%, 0.8 w/cm2 x8'    Ultrasound Goals Edema;Pain   Iontophoresis   Type of Iontophoresis Dexamethasone   Location left lateral malleolus   Dose 1.0 cc   Time 6 hour patch   Ankle Exercises: Stretches   Soleus Stretch 3 reps;20 seconds  with towel in long sitting   Gastroc Stretch 3 reps;20 seconds  rocker                  PT Short Term Goals - 07/21/14 1454    PT SHORT TERM GOAL #1   Title I with initial hep   Status Achieved   PT SHORT TERM GOAL #2   Title demo improved left ankle DF to -5 degrees or better   Status Achieved           PT Long Term Goals - 07/21/14 1454    PT LONG TERM GOAL #1   Title I with advanced HEP   Status On-going   PT LONG TERM GOAL #2   Title amb with a normal gait patttern and pain 1/10 or less   Status On-going   PT LONG TERM GOAL #3   Title demo 4+/5 or better  LLE strength   Status On-going   PT LONG TERM GOAL #4   Title demo 5 degrees left ankle DF or better   Status On-going               Plan - 07/22/14 1522    Clinical Impression Statement Continued focus on pain relieving modalities today due to higher pain levels.    PT Next Visit Plan rom, LLE strength and flexibility. Ionto if needed.    Consulted and Agree with Plan of Care Patient        Problem List Patient Active Problem List   Diagnosis Date Noted  . Chest pain 06/15/2013  . Myalgia 06/30/2012  . Paresthesia 11/12/2011  . Migraine 10/01/2011  . Numbness and tingling in left hand 01/05/2011  . Hyperglycemia   . Essential hypertension 01/25/2010  . GERD 01/25/2010  . COLONIC POLYPS, HYPERPLASTIC, HX OF 01/25/2010  . CERVICAL STRAIN 11/23/2009  . Hyperlipidemia 09/26/2009  . OSTEOPENIA 07/11/2009  . GASTRIC ULCER, HX OF 07/11/2009  . BARRETT'S ESOPHAGUS, HX OF 07/11/2009  . Vitamin D deficiency 07/04/2009  . CHEST PAIN, ATYPICAL, HX OF 07/04/2009    Barbette Hair, PTA 07/22/2014, 3:30 PM  [facility] [place], [place], [address] Phone: [phone]   Fax:  [phone]

## 2014-07-23 ENCOUNTER — Other Ambulatory Visit (INDEPENDENT_AMBULATORY_CARE_PROVIDER_SITE_OTHER): Payer: 59

## 2014-07-23 ENCOUNTER — Other Ambulatory Visit: Payer: 59

## 2014-07-23 ENCOUNTER — Ambulatory Visit: Payer: 59

## 2014-07-23 DIAGNOSIS — E559 Vitamin D deficiency, unspecified: Secondary | ICD-10-CM

## 2014-07-23 DIAGNOSIS — E785 Hyperlipidemia, unspecified: Secondary | ICD-10-CM | POA: Diagnosis not present

## 2014-07-23 LAB — LIPID PANEL
Cholesterol: 187 mg/dL (ref 0–200)
HDL: 44.7 mg/dL (ref >39.00)
LDL Cholesterol: 121 mg/dL — ABNORMAL HIGH (ref 0–99)
NonHDL: 142.3
Total CHOL/HDL Ratio: 4
Triglycerides: 107 mg/dL (ref 0.0–149.0)
VLDL: 21.4 mg/dL (ref 0.0–40.0)

## 2014-07-23 LAB — VITAMIN D 25 HYDROXY (VIT D DEFICIENCY, FRACTURES): VITD: 35.52 ng/mL (ref 30.00–100.00)

## 2014-07-23 NOTE — Addendum Note (Signed)
Addended by: Peggyann Shoals on: 07/23/2014 01:41 PM   Modules accepted: Orders

## 2014-07-26 ENCOUNTER — Ambulatory Visit (INDEPENDENT_AMBULATORY_CARE_PROVIDER_SITE_OTHER): Payer: 59 | Admitting: Family

## 2014-07-26 ENCOUNTER — Encounter: Payer: Self-pay | Admitting: Family

## 2014-07-26 VITALS — BP 122/90 | HR 86 | Temp 97.9°F | Resp 16 | Ht 60.0 in | Wt 155.8 lb

## 2014-07-26 DIAGNOSIS — K219 Gastro-esophageal reflux disease without esophagitis: Secondary | ICD-10-CM

## 2014-07-26 DIAGNOSIS — I10 Essential (primary) hypertension: Secondary | ICD-10-CM

## 2014-07-26 DIAGNOSIS — R739 Hyperglycemia, unspecified: Secondary | ICD-10-CM

## 2014-07-26 DIAGNOSIS — E785 Hyperlipidemia, unspecified: Secondary | ICD-10-CM | POA: Diagnosis not present

## 2014-07-26 DIAGNOSIS — E559 Vitamin D deficiency, unspecified: Secondary | ICD-10-CM

## 2014-07-26 MED ORDER — VITAMIN D3 75 MCG (3000 UT) PO TABS: 1.0000 | ORAL_TABLET | Freq: Every day | ORAL | Status: DC

## 2014-07-26 MED ORDER — ATORVASTATIN CALCIUM 40 MG PO TABS: 40.0000 mg | ORAL_TABLET | Freq: Every day | ORAL | Status: DC

## 2014-07-26 NOTE — Assessment & Plan Note (Signed)
Stable on zegerid, continue same.

## 2014-07-26 NOTE — Patient Instructions (Signed)
Complete lab work prior to leaving. Start vit D 3000units once daily. Restart lipitor- call if you develop recurrent neck pain on lipitor.

## 2014-07-26 NOTE — Progress Notes (Signed)
Subjective:    Patient ID: Whitney Neal, female    DOB: [birth year], 54 y.o.   MRN: [id number]  HPI  Whitney Neal is a 54 yr old female who presents today for follow up.  Patient presents today for follow up of multiple medical problems.  Hyperglycemia  Pt is currently maintained on the following medications: none  Lab Results  Component Value Date   HGBA1C 6.0 12/29/2013   HGBA1C 6.1* 09/01/2013   HGBA1C 6.2* 06/01/2013    Lab Results  Component Value Date   LDLCALC 121* 07/23/2014   CREATININE 0.7 12/29/2013    Hyperlipidemia  Patient reports that she developed some neck pain which radiated down the left arm.  Attributed this pain to lipitor and stopped the medication. Reports pain is now resolved.  Last lipid panel as follows:  Lab Results  Component Value Date   CHOL 187 07/23/2014   HDL 44.70 07/23/2014   LDLCALC 121* 07/23/2014   LDLDIRECT 142.0 04/20/2014   TRIG 107.0 07/23/2014   CHOLHDL 4 07/23/2014   Hypertension  Patient is currently maintained on the following medications for blood pressure: none Patient reports good compliance with blood pressure medications. Patient denies chest pain, shortness of breath or swelling. Last 3 blood pressure readings in our office are as follows: BP Readings from Last 3 Encounters:  07/26/14 122/90  06/27/14 133/89  04/20/14 124/88   GERD- maintained on zegerid. Reports that this is improved with dietary modification  Vit D def- on weekly vit D supplement.    Review of Systems    see HPI  Past Medical History  Diagnosis Date  . Cervical lymphadenopathy     left  . Ulcer 2010  . Hypercholesteremia   . Urinary incontinence   . GERD (gastroesophageal reflux disease)   . Hypertension   . Constipation   . Hyperplastic colonic polyp     history of   . Cervical strain   . Atypical chest pain   . Osteopenia   . Barrett's esophagus     history of   . Vitamin D deficiency   . Hyperglycemia     a1c  6.3 7/12  . Migraine     History   Social History  . Marital Status: Married    Spouse Name: N/A  . Number of Children: 2  . Years of Education: N/A   Occupational History  . [employer]   Social History Main Topics  . Smoking status: Never Smoker   . Smokeless tobacco: Not on file  . Alcohol Use: No  . Drug Use: No  . Sexual Activity: Not on file   Other Topics Concern  . Not on file   Social History Narrative   Reviewed history from 07/04/2009 and no changes required.   Married- is guardian for two grandchildren ages 64 and 38.   Never Smoked   Alcohol use-no   Regular exercise-no   Works in housekeeping at [employer]          Past Surgical History  Procedure Laterality Date  . Bladder surgery      bladder mesh surgery 2010- Dr. Rhodia Albright  . Colonoscopy w/ polypectomy  2010  . Abdominal hysterectomy  2009    Dr. Bethann Goo    Family History  Problem Relation Age of Onset  . Diabetes Sister   . Colon cancer Father   . Hypertension Father   . Heart attack Father 69  . Pulmonary embolism Mother  deceased  . Hypertension Mother   . Hypertension Brother   . Hypertension Sister   . Cancer Paternal Grandmother     oral  . Thyroid disease Sister     1/2 sister thyroid problem  . Healthy Daughter   . Heart attack Brother 42    Allergies  Allergen Reactions  . Codeine     REACTION: headache, visual disturbance    Current Outpatient Prescriptions on File Prior to Visit  Medication Sig Dispense Refill  . amitriptyline (ELAVIL) 25 MG tablet Take 1 tablet (25 mg total) by mouth at bedtime. 30 tablet 2  . aspirin 81 MG EC tablet Take 81 mg by mouth daily.      . Calcium Carbonate-Vitamin D (CALCIUM 600+D) 600-400 MG-UNIT per tablet Take 1 tablet by mouth 2 (two) times daily.      . chlorzoxazone (PARAFON) 500 MG tablet Take 500 mg by mouth 3 (three) times daily as needed for muscle spasms.    . cyclobenzaprine (FLEXERIL) 5 MG tablet Take 1  tablet (5 mg total) by mouth 3 (three) times daily as needed for muscle spasms. 20 tablet 0  . estradiol (ESTRACE) 0.1 MG/GM vaginal cream Place 2 g vaginally. Two times a week.     Marland Kitchen ibuprofen (ADVIL,MOTRIN) 600 MG tablet Take 1 tablet (600 mg total) by mouth every 6 (six) hours as needed. 30 tablet 0  . isometheptene-acetaminophen-dichloralphenazone (MIDRIN) 65-325-100 MG capsule Take 1-2 capsules by mouth 4 (four) times daily as needed for migraine. Maximum 5 capsules in 12 hours for migraine headaches, 8 capsules in 24 hours for tension headaches.    Marland Kitchen omeprazole-sodium bicarbonate (ZEGERID) 40-1100 MG per capsule TAKE 1 CAPSULE BY MOUTH 2 TIMES DAILY. 60 capsule 4  . ondansetron (ZOFRAN ODT) 4 MG disintegrating tablet Take 1 tablet (4 mg total) by mouth every 8 (eight) hours as needed for nausea or vomiting. 20 tablet 0  . promethazine (PHENERGAN) 25 MG tablet Take 25 mg by mouth every 4 (four) hours as needed for nausea.    Marland Kitchen topiramate (TOPAMAX) 25 MG tablet Take 25 mg by mouth at bedtime. May increase by 1 tablet each week until you are taking 4 tablets at bedtime if needed.     No current facility-administered medications on file prior to visit.    BP 122/90 mmHg  Pulse 86  Temp(Src) 97.9 F (36.6 C) (Oral)  Resp 16  Ht 5' (1.524 m)  Wt 155 lb 12.8 oz (70.67 kg)  BMI 30.43 kg/m2  SpO2 98%    Objective:   Physical Exam  Constitutional: She is oriented to person, place, and time. She appears well-developed and well-nourished.  HENT:  Head: Normocephalic and atraumatic.  Cardiovascular: Normal rate, regular rhythm and normal heart sounds.   No murmur heard. Pulmonary/Chest: Effort normal and breath sounds normal. No respiratory distress. She has no wheezes.  Neurological: She is alert and oriented to person, place, and time.  Psychiatric: She has a normal mood and affect. Her behavior is normal. Judgment and thought content normal.          Assessment & Plan:

## 2014-07-26 NOTE — Assessment & Plan Note (Signed)
Discussed with patient that the pain she described sounded like cervical radiculopathy which has now resolved. Doubt that lipitor was cause for this. She has used pravastatin in the past but it was not strong enough for her. I have asked her to restart lipitor and contact us if she has any problems after starting.

## 2014-07-26 NOTE — Progress Notes (Signed)
Pre visit review using our clinic review tool, if applicable. No additional management support is needed unless otherwise documented below in the visit note. 

## 2014-07-26 NOTE — Assessment & Plan Note (Signed)
Obtain follow up A1C.   

## 2014-07-26 NOTE — Assessment & Plan Note (Signed)
Vit D level is now normal after completing 12 weeks of vit D 50000 units weekly. Start vit D 3000 units once daily.

## 2014-07-26 NOTE — Assessment & Plan Note (Signed)
Stable off of meds.  

## 2014-07-27 ENCOUNTER — Ambulatory Visit: Payer: 59 | Admitting: Rehabilitation

## 2014-07-27 LAB — HEMOGLOBIN A1C: Hgb A1c MFr Bld: 5.9 % (ref 4.6–6.5)

## 2014-07-27 LAB — BASIC METABOLIC PANEL
BUN: 13 mg/dL (ref 6–23)
CO2: 26 mEq/L (ref 19–32)
Calcium: 9.8 mg/dL (ref 8.4–10.5)
Chloride: 106 mEq/L (ref 96–112)
Creatinine, Ser: 0.66 mg/dL (ref 0.40–1.20)
GFR: 98.39 mL/min (ref >60.00)
Glucose, Bld: 88 mg/dL (ref 70–99)
Potassium: 4.5 mEq/L (ref 3.5–5.1)
Sodium: 140 mEq/L (ref 135–145)

## 2014-07-28 ENCOUNTER — Ambulatory Visit: Payer: 59 | Admitting: Rehabilitation

## 2014-07-28 DIAGNOSIS — M25572 Pain in left ankle and joints of left foot: Secondary | ICD-10-CM

## 2014-07-28 DIAGNOSIS — R609 Edema, unspecified: Secondary | ICD-10-CM

## 2014-07-28 DIAGNOSIS — M25672 Stiffness of left ankle, not elsewhere classified: Secondary | ICD-10-CM

## 2014-07-28 DIAGNOSIS — M25652 Stiffness of left hip, not elsewhere classified: Secondary | ICD-10-CM

## 2014-07-28 DIAGNOSIS — M6281 Muscle weakness (generalized): Secondary | ICD-10-CM

## 2014-07-28 DIAGNOSIS — R262 Difficulty in walking, not elsewhere classified: Secondary | ICD-10-CM

## 2014-07-28 NOTE — Therapy (Signed)
[facility] [address]  [place], [place], [address] Phone: [phone]   Fax:  [phone]  Physical Therapy Treatment  Patient Details  Name: Whitney Neal MRN: [id number] Date of Birth: [birth year] Referring Provider:  Leandrew Koyanagi, MD  Encounter Date: 07/28/2014      PT End of Session - 07/28/14 1440    Visit Number 5   Number of Visits 12   Date for PT Re-Evaluation 08/19/14   PT Start Time 1440   PT Stop Time 1520   PT Time Calculation (min) 40 min      Past Medical History  Diagnosis Date  . Cervical lymphadenopathy     left  . Ulcer 2010  . Hypercholesteremia   . Urinary incontinence   . GERD (gastroesophageal reflux disease)   . Hypertension   . Constipation   . Hyperplastic colonic polyp     history of   . Cervical strain   . Atypical chest pain   . Osteopenia   . Barrett's esophagus     history of   . Vitamin D deficiency   . Hyperglycemia     a1c 6.3 7/12  . Migraine     Past Surgical History  Procedure Laterality Date  . Bladder surgery      bladder mesh surgery 2010- Dr. Rhodia Albright  . Colonoscopy w/ polypectomy  2010  . Abdominal hysterectomy  2009    Dr. Bethann Goo    There were no vitals filed for this visit.  Visit Diagnosis:  Stiffness of left ankle joint  Pain in left ankle  Edema  Generalized muscle weakness  Difficulty walking  Stiffness of left hip joint      Subjective Assessment - 07/28/14 1443    Subjective Reports she is feeling much better today. Had the weekend off to rest her ankle. States the patch and ultrasound seemed to have helped. Pt reports pain level has been 5/10 at worst  in the past week after working a few days. Pt also states that she worked this past Monday and didn't have much pain.    Currently in Pain? Yes   Pain Score 1    Pain Location Ankle   Pain Orientation Left;Lateral            [facility] PT Assessment - 07/28/14 1444    AROM    AROM Assessment Site Ankle   Right/Left Ankle Left   Left Ankle Dorsiflexion 8   Left Ankle Plantar Flexion 45   Left Ankle Inversion 38   Left Ankle Eversion 32   Strength   Strength Assessment Site Ankle   Right/Left Ankle Left  all tested in sitting   Left Ankle Dorsiflexion 5/5   Left Ankle Plantar Flexion 4+/5   Left Ankle Inversion 5/5   Left Ankle Eversion --  5-/5; pt noted slight pain                     [facility] Adult PT Treatment/Exercise - 07/28/14 1444    Knee/Hip Exercises: Aerobic   Stationary Bike level 1x6'   Knee/Hip Exercises: Standing   Heel Raises 10 reps;1 second  on blue foam   SLS 5" hold x10 on BOSU with 2 pole assist   Other Standing Knee Exercises marching on blue foam 5"x10   Modalities   Modalities Ultrasound;Iontophoresis   Ultrasound   Ultrasound Location Lt lateral ankle   Ultrasound Parameters 3.3Mhz, 20@, 0.8w/cm2, x8'  Ultrasound Goals Edema;Pain   Iontophoresis   Type of Iontophoresis Dexamethasone   Location left lateral malleolus   Dose 1.0 cc   Time 6 hour patch (4/6)   Ankle Exercises: Supine   Isometrics PF/DF, IV/EV 3"x10 each way   Ankle Exercises: Stretches   Soleus Stretch 3 reps;20 seconds   Gastroc Stretch 3 reps;20 seconds                  PT Short Term Goals - 07/21/14 1454    PT SHORT TERM GOAL #1   Title I with initial hep   Status Achieved   PT SHORT TERM GOAL #2   Title demo improved left ankle DF to -5 degrees or better   Status Achieved           PT Long Term Goals - 07/28/14 1456    PT LONG TERM GOAL #1   Title I with advanced HEP   Status On-going   PT LONG TERM GOAL #2   Title amb with a normal gait patttern and pain 1/10 or less   Status On-going   PT LONG TERM GOAL #3   Title demo 4+/5 or better LLE strength  Met ankle portion in non-weightbearing position   Status On-going   PT LONG TERM GOAL #4   Title demo 5 degrees left ankle DF or better   Status Achieved                Plan - 07/28/14 1522    Clinical Impression Statement Good progress with ROM and MMTing compared to the evalutation. Pt has met all short term goals and met one long term goal. So far, pt seems to be responding well to ultrasound and iontophoresis treatment and we will continue to increase exercises as pt is able.    PT Next Visit Plan rom, LLE strength and flexibility. Ionto if needed.    Consulted and Agree with Plan of Care Patient        Problem List Patient Active Problem List   Diagnosis Date Noted  . Migraine 10/01/2011  . Numbness and tingling in left hand 01/05/2011  . Hyperglycemia   . Essential hypertension 01/25/2010  . GERD 01/25/2010  . COLONIC POLYPS, HYPERPLASTIC, HX OF 01/25/2010  . CERVICAL STRAIN 11/23/2009  . Hyperlipidemia 09/26/2009  . OSTEOPENIA 07/11/2009  . GASTRIC ULCER, HX OF 07/11/2009  . BARRETT'S ESOPHAGUS, HX OF 07/11/2009  . Vitamin D deficiency 07/04/2009  . CHEST PAIN, ATYPICAL, HX OF 07/04/2009    Barbette Hair, PTA  07/28/2014, 3:45 PM  [facility] [address]  [place], [place], [address] Phone: [phone]   Fax:  [phone]

## 2014-07-30 ENCOUNTER — Ambulatory Visit: Payer: 59 | Admitting: Physical Therapy

## 2014-07-30 ENCOUNTER — Encounter: Payer: Self-pay | Admitting: Family

## 2014-07-30 DIAGNOSIS — M25572 Pain in left ankle and joints of left foot: Secondary | ICD-10-CM

## 2014-07-30 DIAGNOSIS — M6281 Muscle weakness (generalized): Secondary | ICD-10-CM

## 2014-07-30 DIAGNOSIS — R262 Difficulty in walking, not elsewhere classified: Secondary | ICD-10-CM

## 2014-07-30 DIAGNOSIS — M25672 Stiffness of left ankle, not elsewhere classified: Secondary | ICD-10-CM

## 2014-07-30 DIAGNOSIS — R609 Edema, unspecified: Secondary | ICD-10-CM

## 2014-07-30 DIAGNOSIS — M25652 Stiffness of left hip, not elsewhere classified: Secondary | ICD-10-CM

## 2014-07-30 NOTE — Therapy (Signed)
[facility] [address]  [place], [place], [address] Phone: [phone]   Fax:  [phone]  Physical Therapy Treatment  Patient Details  Name: Whitney Neal MRN: [id number] Date of Birth: 1960/04/17 Referring Provider:  Leandrew Koyanagi, MD  Encounter Date: 07/30/2014      PT End of Session - 07/30/14 0925    Visit Number 6   Number of Visits 12   Date for PT Re-Evaluation 08/19/14   PT Start Time 0846   PT Stop Time 0925   PT Time Calculation (min) 39 min   Activity Tolerance Patient tolerated treatment well   Behavior During Therapy [facility] for tasks assessed/performed      Past Medical History  Diagnosis Date  . Cervical lymphadenopathy     left  . Ulcer 2010  . Hypercholesteremia   . Urinary incontinence   . GERD (gastroesophageal reflux disease)   . Hypertension   . Constipation   . Hyperplastic colonic polyp     history of   . Cervical strain   . Atypical chest pain   . Osteopenia   . Barrett's esophagus     history of   . Vitamin D deficiency   . Hyperglycemia     a1c 6.3 7/12  . Migraine     Past Surgical History  Procedure Laterality Date  . Bladder surgery      bladder mesh surgery 2010- Dr. Rhodia Albright  . Colonoscopy w/ polypectomy  2010  . Abdominal hysterectomy  2009    Dr. Bethann Goo    There were no vitals filed for this visit.  Visit Diagnosis:  Stiffness of left ankle joint  Pain in left ankle  Edema  Generalized muscle weakness  Difficulty walking  Stiffness of left hip joint      Subjective Assessment - 07/30/14 0848    Subjective L ankle feeling better; was supposed to got to MD yesterday but had to cancel.  Rescheduled for 6/23. Has a little bit of pain with working.   Patient Stated Goals to get back to work and get rid of pain   Currently in Pain? No/denies                         [facility] Adult PT Treatment/Exercise - 07/30/14 0849    Knee/Hip  Exercises: Aerobic   Stationary Bike level 1x6'   Ultrasound   Ultrasound Location L lateral ankle   Ultrasound Parameters 3.3 mHz, 20% DC, 0.8 w/cm2 x 8 min   Ultrasound Goals Edema;Pain   Iontophoresis   Type of Iontophoresis Dexamethasone   Location left lateral malleolus   Dose 1.0 cc   Time 6 hour patch (4/6)   Ankle Exercises: Stretches   Gastroc Stretch 3 reps;30 seconds   Ankle Exercises: Seated   BAPS Sitting;Level 3;15 reps  DF/PF, INV/EV, CW/CCW, mod cues for technique   Ankle Exercises: Standing   SLS 5x10 sec with intermittent UE support on blue foam                  PT Short Term Goals - 07/21/14 1454    PT SHORT TERM GOAL #1   Title I with initial hep   Status Achieved   PT SHORT TERM GOAL #2   Title demo improved left ankle DF to -5 degrees or better   Status Achieved           PT Long Term  Goals - 07/28/14 1456    PT LONG TERM GOAL #1   Title I with advanced HEP   Status On-going   PT LONG TERM GOAL #2   Title amb with a normal gait patttern and pain 1/10 or less   Status On-going   PT LONG TERM GOAL #3   Title demo 4+/5 or better LLE strength  Met ankle portion in non-weightbearing position   Status On-going   PT LONG TERM GOAL #4   Title demo 5 degrees left ankle DF or better   Status Achieved               Plan - 07/30/14 0925    Clinical Impression Statement Pt reports minimal pain while working.  Progressing well towards goals.   PT Next Visit Plan rom, LLE strength and flexibility. Ionto if needed.    Consulted and Agree with Plan of Care Patient        Problem List Patient Active Problem List   Diagnosis Date Noted  . Migraine 10/01/2011  . Numbness and tingling in left hand 01/05/2011  . Hyperglycemia   . Essential hypertension 01/25/2010  . GERD 01/25/2010  . COLONIC POLYPS, HYPERPLASTIC, HX OF 01/25/2010  . CERVICAL STRAIN 11/23/2009  . Hyperlipidemia 09/26/2009  . OSTEOPENIA 07/11/2009  . GASTRIC  ULCER, HX OF 07/11/2009  . BARRETT'S ESOPHAGUS, HX OF 07/11/2009  . Vitamin D deficiency 07/04/2009  . CHEST PAIN, ATYPICAL, HX OF 07/04/2009   Laureen Abrahams, PT, DPT 07/30/2014 9:27 AM  [facility] [place], [place], [address] Phone: [phone]   Fax:  [phone]

## 2014-08-03 ENCOUNTER — Ambulatory Visit: Payer: 59 | Admitting: Rehabilitation

## 2014-08-04 ENCOUNTER — Ambulatory Visit: Payer: 59 | Admitting: Rehabilitation

## 2014-08-04 DIAGNOSIS — M25672 Stiffness of left ankle, not elsewhere classified: Secondary | ICD-10-CM | POA: Diagnosis not present

## 2014-08-04 DIAGNOSIS — M25652 Stiffness of left hip, not elsewhere classified: Secondary | ICD-10-CM

## 2014-08-04 DIAGNOSIS — M25572 Pain in left ankle and joints of left foot: Secondary | ICD-10-CM

## 2014-08-04 DIAGNOSIS — R609 Edema, unspecified: Secondary | ICD-10-CM

## 2014-08-04 DIAGNOSIS — R262 Difficulty in walking, not elsewhere classified: Secondary | ICD-10-CM

## 2014-08-04 DIAGNOSIS — M6281 Muscle weakness (generalized): Secondary | ICD-10-CM

## 2014-08-04 NOTE — Therapy (Addendum)
[facility] [address]  [place], [place], [address] Phone: [phone]   Fax:  [phone]  Physical Therapy Treatment  Patient Details  Name: VANDELLA ORD MRN: [id number] Date of Birth: May 06, 1960 Referring Provider:  Leandrew Koyanagi, MD  Encounter Date: 08/04/2014      PT End of Session - 08/04/14 1400    Visit Number 7   Number of Visits 12   Date for PT Re-Evaluation 08/19/14   PT Start Time 1400   PT Stop Time 1435   PT Time Calculation (min) 35 min      Past Medical History  Diagnosis Date  . Cervical lymphadenopathy     left  . Ulcer 2010  . Hypercholesteremia   . Urinary incontinence   . GERD (gastroesophageal reflux disease)   . Hypertension   . Constipation   . Hyperplastic colonic polyp     history of   . Cervical strain   . Atypical chest pain   . Osteopenia   . Barrett's esophagus     history of   . Vitamin D deficiency   . Hyperglycemia     a1c 6.3 7/12  . Migraine     Past Surgical History  Procedure Laterality Date  . Bladder surgery      bladder mesh surgery 2010- Dr. Rhodia Albright  . Colonoscopy w/ polypectomy  2010  . Abdominal hysterectomy  2009    Dr. Bethann Goo    There were no vitals filed for this visit.  Visit Diagnosis:  Stiffness of left ankle joint  Pain in left ankle  Edema  Generalized muscle weakness  Difficulty walking  Stiffness of left hip joint      Subjective Assessment - 08/04/14 1402    Subjective Reports ankle is feeling a lot better, reports no pain for the past few days. Says her ankle doesn't get painful but will get tired. When her ankle gets tired, she stops and rests then it feels better.    Currently in Pain? No/denies            [facility] PT Assessment - 08/04/14 1404    Assessment   Next MD Visit 08/12/2014   AROM   AROM Assessment Site Ankle   Right/Left Ankle Left   Left Ankle Dorsiflexion 10   Left Ankle Plantar Flexion 45   Left  Ankle Inversion 38   Left Ankle Eversion 32   Strength   Strength Assessment Site Ankle;Knee;Hip   Right/Left Hip Left   Left Hip Flexion 4+/5   Left Hip Extension 4/5   Left Hip ABduction 4+/5   Right/Left Knee Left   Left Knee Flexion --  5-/5   Left Knee Extension 5/5   Right/Left Ankle Left  all tested in sitting   Left Ankle Dorsiflexion 5/5   Left Ankle Plantar Flexion 4+/5   Left Ankle Inversion 5/5   Left Ankle Eversion --  5-/5                     [facility] Adult PT Treatment/Exercise - 08/04/14 1405    Knee/Hip Exercises: Aerobic   Stationary Bike level 1x6'   Knee/Hip Exercises: Standing   Other Standing Knee Exercises marching on blue foam 5"x10   Other Standing Knee Exercises alt hip abduction on blue foam x10   Modalities   Modalities Ultrasound;Iontophoresis   Ultrasound   Ultrasound Location Lt lateral ankle   Ultrasound Parameters 3.56mz, 20%, 0.8  w/cm2, x8'   Ultrasound Goals Edema;Pain   Ankle Exercises: Standing   SLS 5x10 sec with intermittent UE support on blue foam                  PT Short Term Goals - 08/04/14 1404    PT SHORT TERM GOAL #1   Title I with initial hep   Status Achieved   PT SHORT TERM GOAL #2   Title demo improved left ankle DF to -5 degrees or better   Status New           PT Long Term Goals - 08/04/14 1404    PT LONG TERM GOAL #1   Title I with advanced HEP   Status Achieved   PT LONG TERM GOAL #2   Title amb with a normal gait patttern and pain 1/10 or less   Status Achieved   PT LONG TERM GOAL #3   Title demo 4+/5 or better LLE strength   Status Partially Met   PT LONG TERM GOAL #4   Title demo 5 degrees left ankle DF or better   Status Achieved               Plan - 08/04/14 1436    Clinical Impression Statement Reports no pain with exercises today or with MMTing and AROM. Pt feels ready for d/c but wants to check with MD first. Reviewed HEP with progression today. Did not perform  ionto due to painfree status.    PT Next Visit Plan See what MD says.         Problem List Patient Active Problem List   Diagnosis Date Noted  . Migraine 10/01/2011  . Numbness and tingling in left hand 01/05/2011  . Hyperglycemia   . Essential hypertension 01/25/2010  . GERD 01/25/2010  . COLONIC POLYPS, HYPERPLASTIC, HX OF 01/25/2010  . CERVICAL STRAIN 11/23/2009  . Hyperlipidemia 09/26/2009  . OSTEOPENIA 07/11/2009  . GASTRIC ULCER, HX OF 07/11/2009  . BARRETT'S ESOPHAGUS, HX OF 07/11/2009  . Vitamin D deficiency 07/04/2009  . CHEST PAIN, ATYPICAL, HX OF 07/04/2009    Barbette Hair, PTA 08/04/2014, 2:39 PM  [facility] [place], [place], [address] Phone: [phone]   Fax:  [phone]      PHYSICAL THERAPY DISCHARGE SUMMARY  Visits from Start of Care: 7  Current functional level related to goals / functional outcomes: See above; all goals met except 1 goal partially met   Remaining deficits: N/a; pt doing well and ready for d/c   Education / Equipment: HEP  Plan: Patient agrees to discharge.  Patient goals were met. Patient is being discharged due to meeting the stated rehab goals.  ?????    Laureen Abrahams, PT, DPT 08/31/2014 4:40 PM  [facility] [place], [place] [address]  [phone] (office) [phone] (fax)

## 2014-11-29 ENCOUNTER — Encounter: Payer: Self-pay | Admitting: Family

## 2014-11-29 ENCOUNTER — Ambulatory Visit (INDEPENDENT_AMBULATORY_CARE_PROVIDER_SITE_OTHER): Payer: 59 | Admitting: Family

## 2014-11-29 VITALS — BP 131/82 | HR 77 | Temp 98.3°F | Resp 16 | Ht 60.0 in | Wt 155.2 lb

## 2014-11-29 DIAGNOSIS — E559 Vitamin D deficiency, unspecified: Secondary | ICD-10-CM

## 2014-11-29 DIAGNOSIS — R739 Hyperglycemia, unspecified: Secondary | ICD-10-CM | POA: Diagnosis not present

## 2014-11-29 DIAGNOSIS — K219 Gastro-esophageal reflux disease without esophagitis: Secondary | ICD-10-CM | POA: Diagnosis not present

## 2014-11-29 DIAGNOSIS — Z0000 Encounter for general adult medical examination without abnormal findings: Secondary | ICD-10-CM | POA: Diagnosis not present

## 2014-11-29 DIAGNOSIS — I10 Essential (primary) hypertension: Secondary | ICD-10-CM

## 2014-11-29 MED ORDER — OMEPRAZOLE-SODIUM BICARBONATE 40-1100 MG PO CAPS: 1.0000 | ORAL_CAPSULE | Freq: Every day | ORAL | Status: DC

## 2014-11-29 MED ORDER — ATORVASTATIN CALCIUM 40 MG PO TABS: 40.0000 mg | ORAL_TABLET | Freq: Every day | ORAL | Status: DC

## 2014-11-29 NOTE — Assessment & Plan Note (Signed)
Continue low sugar diet.  Obtain follow up A1C.

## 2014-11-29 NOTE — Assessment & Plan Note (Signed)
Stable on zegerid, continue same.  

## 2014-11-29 NOTE — Assessment & Plan Note (Signed)
Vit D level normal last visit. Continue current dose of otc vit d.

## 2014-11-29 NOTE — Patient Instructions (Signed)
Please complete lab work prior to leaving. Schedule mammogram on the first floor.  

## 2014-11-29 NOTE — Progress Notes (Signed)
Subjective:    Patient ID: Whitney Neal, female    DOB: Oct 28, 1960, 54 y.o.   MRN: [id number]  HPI  [name] is a 54 yr old female who presents today for follow up.  1) GERD- maintained on zegerid. Reports no GERD symptoms when she is on med.   2) HTN-  Maintained on diet alone.  BP Readings from Last 3 Encounters:  11/29/14 131/82  07/26/14 122/90  06/27/14 133/89   3) Hyperglycemia- reports that she avoids concentrated sweets.   Lab Results  Component Value Date   HGBA1C 5.9 07/26/2014   4) Vit D deficiency- continues otc vit d supplement.     Review of Systems    see HPI  Past Medical History  Diagnosis Date  . Cervical lymphadenopathy     left  . Ulcer 2010  . Hypercholesteremia   . Urinary incontinence   . GERD (gastroesophageal reflux disease)   . Hypertension   . Constipation   . Hyperplastic colonic polyp     history of   . Cervical strain   . Atypical chest pain   . Osteopenia   . Barrett's esophagus     history of   . Vitamin D deficiency   . Hyperglycemia     a1c 6.3 7/12  . Migraine     Social History   Social History  . Marital Status: Married    Spouse Name: N/A  . Number of Children: 2  . Years of Education: N/A   Occupational History  . [employer]   Social History Main Topics  . Smoking status: Never Smoker   . Smokeless tobacco: Not on file  . Alcohol Use: No  . Drug Use: No  . Sexual Activity: Not on file   Other Topics Concern  . Not on file   Social History Narrative   Reviewed history from 07/04/2009 and no changes required.   Married- is guardian for two grandchildren ages 29 and 54.   Never Smoked   Alcohol use-no   Regular exercise-no   Works in housekeeping at [employer]          Past Surgical History  Procedure Laterality Date  . Bladder surgery      bladder mesh surgery 2010- Dr. Rhodia Albright  . Colonoscopy w/ polypectomy  2010  . Abdominal hysterectomy  2009    Dr. Bethann Goo     Family History  Problem Relation Age of Onset  . Diabetes Sister   . Colon cancer Father   . Hypertension Father   . Heart attack Father 91  . Pulmonary embolism Mother     deceased  . Hypertension Mother   . Hypertension Brother   . Hypertension Sister   . Cancer Paternal Grandmother     oral  . Thyroid disease Sister     1/2 sister thyroid problem  . Healthy Daughter   . Heart attack Brother 42    Allergies  Allergen Reactions  . Codeine     REACTION: headache, visual disturbance    Current Outpatient Prescriptions on File Prior to Visit  Medication Sig Dispense Refill  . amitriptyline (ELAVIL) 25 MG tablet Take 1 tablet (25 mg total) by mouth at bedtime. 30 tablet 2  . aspirin 81 MG EC tablet Take 81 mg by mouth daily.      . Calcium Carbonate-Vitamin D (CALCIUM 600+D) 600-400 MG-UNIT per tablet Take 1 tablet by mouth 2 (two) times daily.      Marland Kitchen  chlorzoxazone (PARAFON) 500 MG tablet Take 500 mg by mouth 3 (three) times daily as needed for muscle spasms.    . Cholecalciferol (VITAMIN D3) 3000 UNITS TABS Take 1 tablet by mouth daily.    . cyclobenzaprine (FLEXERIL) 5 MG tablet Take 1 tablet (5 mg total) by mouth 3 (three) times daily as needed for muscle spasms. 20 tablet 0  . estradiol (ESTRACE) 0.1 MG/GM vaginal cream Place 2 g vaginally. Two times a week.     Marland Kitchen ibuprofen (ADVIL,MOTRIN) 600 MG tablet Take 1 tablet (600 mg total) by mouth every 6 (six) hours as needed. 30 tablet 0  . isometheptene-acetaminophen-dichloralphenazone (MIDRIN) 65-325-100 MG capsule Take 1-2 capsules by mouth 4 (four) times daily as needed for migraine. Maximum 5 capsules in 12 hours for migraine headaches, 8 capsules in 24 hours for tension headaches.    . ondansetron (ZOFRAN ODT) 4 MG disintegrating tablet Take 1 tablet (4 mg total) by mouth every 8 (eight) hours as needed for nausea or vomiting. 20 tablet 0  . promethazine (PHENERGAN) 25 MG tablet Take 25 mg by mouth every 4 (four) hours as  needed for nausea.    Marland Kitchen topiramate (TOPAMAX) 25 MG tablet Take 25 mg by mouth at bedtime. May increase by 1 tablet each week until you are taking 4 tablets at bedtime if needed.     No current facility-administered medications on file prior to visit.    BP 131/82 mmHg  Pulse 77  Temp(Src) 98.3 F (36.8 C) (Oral)  Resp 16  Ht 5' (1.524 m)  Wt 155 lb 3.2 oz (70.398 kg)  BMI 30.31 kg/m2  SpO2 100%    Objective:   Physical Exam  Constitutional: She is oriented to person, place, and time. She appears well-developed and well-nourished.  HENT:  Head: Normocephalic and atraumatic.  Cardiovascular: Normal rate, regular rhythm and normal heart sounds.   No murmur heard. Pulmonary/Chest: Effort normal and breath sounds normal. No respiratory distress. She has no wheezes.  Musculoskeletal: She exhibits no edema.  Neurological: She is alert and oriented to person, place, and time.  Skin: Skin is warm and dry.  Psychiatric: She has a normal mood and affect. Her behavior is normal. Judgment and thought content normal.          Assessment & Plan:

## 2014-11-29 NOTE — Progress Notes (Signed)
Pre visit review using our clinic review tool, if applicable. No additional management support is needed unless otherwise documented below in the visit note. 

## 2014-11-29 NOTE — Assessment & Plan Note (Signed)
bp stable off of meds, continue low sodium diet.

## 2014-11-30 LAB — BASIC METABOLIC PANEL
BUN: 12 mg/dL (ref 6–23)
CO2: 23 mEq/L (ref 19–32)
Calcium: 9.4 mg/dL (ref 8.4–10.5)
Chloride: 110 mEq/L (ref 96–112)
Creatinine, Ser: 0.65 mg/dL (ref 0.40–1.20)
GFR: 100.02 mL/min (ref >60.00)
Glucose, Bld: 92 mg/dL (ref 70–99)
Potassium: 4.8 mEq/L (ref 3.5–5.1)
Sodium: 142 mEq/L (ref 135–145)

## 2014-11-30 LAB — HEMOGLOBIN A1C: Hgb A1c MFr Bld: 6 % (ref 4.6–6.5)

## 2014-12-01 ENCOUNTER — Encounter: Payer: Self-pay | Admitting: Family

## 2014-12-02 ENCOUNTER — Ambulatory Visit (HOSPITAL_BASED_OUTPATIENT_CLINIC_OR_DEPARTMENT_OTHER): Payer: 59

## 2014-12-06 ENCOUNTER — Ambulatory Visit (HOSPITAL_BASED_OUTPATIENT_CLINIC_OR_DEPARTMENT_OTHER): Payer: 59

## 2014-12-13 ENCOUNTER — Ambulatory Visit (HOSPITAL_BASED_OUTPATIENT_CLINIC_OR_DEPARTMENT_OTHER)
Admission: RE | Admit: 2014-12-13 | Discharge: 2014-12-13 | Disposition: A | Discharge status: Home / Self Care | Payer: 59 | Source: Ambulatory Visit | Attending: Family | Admitting: Family

## 2014-12-13 DIAGNOSIS — R921 Mammographic calcification found on diagnostic imaging of breast: Secondary | ICD-10-CM | POA: Insufficient documentation

## 2014-12-13 DIAGNOSIS — Z1231 Encounter for screening mammogram for malignant neoplasm of breast: Secondary | ICD-10-CM | POA: Insufficient documentation

## 2014-12-13 DIAGNOSIS — Z0000 Encounter for general adult medical examination without abnormal findings: Secondary | ICD-10-CM

## 2014-12-15 ENCOUNTER — Other Ambulatory Visit: Payer: Self-pay | Admitting: Family

## 2014-12-15 DIAGNOSIS — R928 Other abnormal and inconclusive findings on diagnostic imaging of breast: Secondary | ICD-10-CM

## 2014-12-20 ENCOUNTER — Ambulatory Visit
Admission: RE | Admit: 2014-12-20 | Discharge: 2014-12-20 | Disposition: A | Discharge status: Home / Self Care | Payer: 59 | Source: Ambulatory Visit | Attending: Family | Admitting: Family

## 2014-12-20 DIAGNOSIS — R928 Other abnormal and inconclusive findings on diagnostic imaging of breast: Secondary | ICD-10-CM

## 2015-02-28 MED FILL — OMEPRAZOLE DR 40 MG CAPSULE: 40 | 15 days supply | Qty: 30 | Fill #1

## 2015-02-28 MED FILL — ATORVASTATIN 40 MG TABLET: 40 | 90 days supply | Qty: 90 | Fill #1

## 2015-02-28 MED FILL — PREMARIN VAGINAL CREAM-APPL: 0.625 | 90 days supply | Qty: 30 | Fill #0

## 2015-02-28 MED FILL — PROMETHAZINE 25 MG TABLET: 25 | 2 days supply | Qty: 15 | Fill #1

## 2015-02-28 MED FILL — TOPIRAMATE 25 MG TABLET: 25 | 30 days supply | Qty: 120 | Fill #1

## 2015-05-30 ENCOUNTER — Ambulatory Visit: Payer: 59 | Admitting: Family

## 2015-06-13 MED FILL — VIT D2 1.25 MG (50,000 UNIT: 1.25 MG | 28 days supply | Qty: 4 | Fill #0

## 2015-06-16 MED FILL — CHLORZOXAZONE 500 MG TABLET: 500 | 10 days supply | Qty: 30 | Fill #0

## 2015-07-07 MED FILL — OMEPRAZOLE DR 40 MG CAPSULE: 40 | 15 days supply | Qty: 30 | Fill #2

## 2015-09-15 ENCOUNTER — Telehealth: Payer: Self-pay | Admitting: Family

## 2015-09-15 NOTE — Telephone Encounter (Signed)
Ok with me 

## 2015-09-15 NOTE — Telephone Encounter (Signed)
Patient would like to transfer from [facility] to Dr. Charlett Blake please advise

## 2015-09-18 NOTE — Telephone Encounter (Signed)
OK with me.

## 2015-09-21 NOTE — Telephone Encounter (Signed)
Patient scheduled with Dr. Charlett Blake for 12/15/2015

## 2015-12-07 LAB — CBC AND DIFFERENTIAL
Hemoglobin: 13.7 g/dL (ref 12.0–16.0)
Platelets: 360 10*3/uL (ref 150–399)
WBC: 4.4 10^3/mL

## 2015-12-07 LAB — BASIC METABOLIC PANEL
BUN: 12 mg/dL (ref 4–21)
Creatinine: 0.6 mg/dL (ref 0.5–1.1)

## 2015-12-07 LAB — HEPATIC FUNCTION PANEL
ALT: 13 U/L (ref 7–35)
AST: 16 U/L (ref 13–35)

## 2015-12-14 ENCOUNTER — Telehealth: Payer: Self-pay | Admitting: Behavioral Health

## 2015-12-14 NOTE — Telephone Encounter (Signed)
Unable to reach patient at time of Pre-Visit Call.  Left message for patient to return call when available.    

## 2015-12-15 ENCOUNTER — Ambulatory Visit (INDEPENDENT_AMBULATORY_CARE_PROVIDER_SITE_OTHER): Payer: 59 | Admitting: Family Medicine

## 2015-12-15 ENCOUNTER — Encounter: Payer: Self-pay | Admitting: Family Medicine

## 2015-12-15 ENCOUNTER — Telehealth: Payer: Self-pay | Admitting: Family Medicine

## 2015-12-15 VITALS — BP 122/82 | HR 95 | Temp 98.9°F | Wt 156.6 lb

## 2015-12-15 DIAGNOSIS — I10 Essential (primary) hypertension: Secondary | ICD-10-CM

## 2015-12-15 NOTE — Progress Notes (Signed)
  HPI  Review of Systems    Physical Exam  Musculoskeletal: Normal range of motion.     Patient left without being seen

## 2015-12-15 NOTE — Telephone Encounter (Signed)
OK we will transfer her records where ever she requests. . I will send her a dismissal letter unless there is an objection

## 2015-12-15 NOTE — Telephone Encounter (Signed)
-----   Message from Oneta Rack sent at 12/15/2015  2:55 PM EDT ----- Regarding: Patient Concern Patient had a transfer from [facility] to Dr. Charlett Blake appointment today at 2pm and stated she waited in the room for 45 minutes. Patient was upset stating she has been a patient for 10 years and will not be back.

## 2015-12-15 NOTE — Progress Notes (Signed)
Pre visit review using our clinic review tool, if applicable. No additional management support is needed unless otherwise documented below in the visit note. 

## 2015-12-16 ENCOUNTER — Encounter: Payer: Self-pay | Admitting: Family

## 2015-12-16 ENCOUNTER — Encounter: Payer: Self-pay | Admitting: Family Medicine

## 2015-12-16 NOTE — Telephone Encounter (Signed)
Please start a dismissal letter from practice for loss of therapeutic relationship

## 2015-12-16 NOTE — Telephone Encounter (Signed)
Completed form/letter on PCP's desk, will then give to Martinique Johnson Office Manager.

## 2015-12-16 NOTE — Telephone Encounter (Signed)
OK with me to send dismissal letter. Thanks.

## 2015-12-21 ENCOUNTER — Telehealth: Payer: Self-pay | Admitting: Family Medicine

## 2015-12-21 NOTE — Telephone Encounter (Signed)
Patient dismissed from [facility] by Penni Homans MD , effective December 16, 2015. Dismissal letter sent out by certified / registered mail.  DAJ

## 2016-01-05 NOTE — Progress Notes (Signed)
Letter revised and forwarded to Dr. Charlett Blake for review and signature.

## 2016-01-23 NOTE — Telephone Encounter (Signed)
Received signed domestic return receipt verifying delivery of certified letter on January 09, 2016. Article number [id number] [place] DAJ

## 2016-01-24 ENCOUNTER — Encounter: Payer: Self-pay | Admitting: *Deleted

## 2016-01-24 ENCOUNTER — Emergency Department (INDEPENDENT_AMBULATORY_CARE_PROVIDER_SITE_OTHER)
Admission: EM | Admit: 2016-01-24 | Discharge: 2016-01-24 | Disposition: A | Discharge status: Home / Self Care | Payer: 59 | Source: Home / Self Care | Attending: Family Medicine | Admitting: Family Medicine

## 2016-01-24 DIAGNOSIS — J019 Acute sinusitis, unspecified: Secondary | ICD-10-CM | POA: Diagnosis not present

## 2016-01-24 DIAGNOSIS — R030 Elevated blood-pressure reading, without diagnosis of hypertension: Secondary | ICD-10-CM

## 2016-01-24 MED ORDER — FLUTICASONE PROPIONATE 50 MCG/ACT NA SUSP: 2.0000 | Freq: Every day | NASAL | 2 refills | Status: DC

## 2016-01-24 MED ORDER — ACETAMINOPHEN 325 MG PO TABS
975.0000 mg | ORAL_TABLET | Freq: Once | ORAL | Status: AC
  Administered 2016-01-24: 975 mg via ORAL

## 2016-01-24 MED ORDER — PREDNISONE 20 MG PO TABS: ORAL_TABLET | ORAL | 0 refills | Status: DC

## 2016-01-24 MED ORDER — AMOXICILLIN-POT CLAVULANATE 875-125 MG PO TABS: 1.0000 | ORAL_TABLET | Freq: Two times a day (BID) | ORAL | 0 refills | Status: DC

## 2016-01-24 MED FILL — predniSONE 20 MG TABS: 20 | 5 days supply | Qty: 11 | Fill #0

## 2016-01-24 MED FILL — AMOX-CLAV 875-125 MG TABLET: 875-125 | 7 days supply | Qty: 14 | Fill #0

## 2016-01-24 MED FILL — FLUTICASONE PROP 50 MCG SPR: 50 | 30 days supply | Qty: 16 | Fill #0

## 2016-01-24 NOTE — ED Triage Notes (Signed)
Pt c/o nasal congestion, hoarseness, HA, runny nose, and sinus pressure x 6 days. Denies fever.

## 2016-01-24 NOTE — ED Provider Notes (Signed)
CSN: [id number]     Arrival date & time 01/24/16  1509 History   First MD Initiated Contact with Patient 01/24/16 1517     Chief Complaint  Patient presents with  . Nasal Congestion   (Consider location/radiation/quality/duration/timing/severity/associated sxs/prior Treatment) HPI  Whitney Neal is a 55 y.o. female presenting to UC with c/o 6 days of gradually worsening sinus pain and pressure, associated sore throat with hoarse voice and frontal headache.  She has taken Nyquil with mild temporary relief. No medications taken today. She notes she gets a sinus infection about 1-2x per year or every other year. No sick contacts or recent travel. Denies fever, chills, n/v/d.   BP is elevated in triage. Pt notes it is due to her frontal headache today. Denies chest pain or SOB. Denies personal hx of HTN but notes she doe shave a family hx of HTN, including her twin brother.   Past Medical History:  Diagnosis Date  . Atypical chest pain   . Barrett's esophagus    history of   . Cervical lymphadenopathy    left  . Cervical strain   . Constipation   . GERD (gastroesophageal reflux disease)   . Hypercholesteremia   . Hyperglycemia    a1c 6.3 7/12  . Hyperplastic colonic polyp    history of   . Hypertension   . Migraine   . Osteopenia   . Ulcer ([place]) 2010  . Urinary incontinence   . Vitamin D deficiency    Past Surgical History:  Procedure Laterality Date  . ABDOMINAL HYSTERECTOMY  2009   Dr. Bethann Goo  . BLADDER SURGERY     bladder mesh surgery 2010- Dr. Rhodia Albright  . COLONOSCOPY W/ POLYPECTOMY  2010   Family History  Problem Relation Age of Onset  . Colon cancer Father   . Hypertension Father   . Heart attack Father 43  . Cancer Father     prostate CA  . Pulmonary embolism Mother     deceased  . Hypertension Mother   . Diabetes Sister   . Hypertension Brother   . Hypertension Sister   . Cancer Paternal Grandmother     oral  . Thyroid disease Sister     1/2 sister thyroid  problem  . Healthy Daughter   . Heart attack Brother 82   Social History  Substance Use Topics  . Smoking status: Never Smoker  . Smokeless tobacco: Never Used  . Alcohol use No   OB History    No data available     Review of Systems  Constitutional: Negative for chills and fever.  HENT: Positive for congestion, ear pain ( bilateral ear pressure, worse in Right ear), postnasal drip, sinus pain, sinus pressure and sore throat. Negative for rhinorrhea, trouble swallowing and voice change.   Respiratory: Positive for cough ( minimal). Negative for shortness of breath.   Cardiovascular: Negative for chest pain and palpitations.  Gastrointestinal: Negative for abdominal pain, diarrhea, nausea and vomiting.  Musculoskeletal: Negative for arthralgias, back pain and myalgias.  Skin: Negative for rash.  Neurological: Positive for headaches ( frontal). Negative for dizziness and light-headedness.    Allergies  Codeine  Home Medications   Prior to Admission medications   Medication Sig Start Date End Date Taking? Authorizing Provider  amitriptyline (ELAVIL) 25 MG tablet Take 1 tablet (25 mg total) by mouth at bedtime. 05/23/12   Debbrah Alar, NP  amoxicillin-clavulanate (AUGMENTIN) 875-125 MG tablet Take 1 tablet by mouth 2 (two) times daily.  One po bid x 7 days 01/24/16   Noland Fordyce, PA-C  aspirin 81 MG EC tablet Take 81 mg by mouth daily.      Historical Provider, MD  atorvastatin (LIPITOR) 40 MG tablet Take 1 tablet (40 mg total) by mouth daily. 11/29/14   Debbrah Alar, NP  Calcium Carbonate-Vitamin D (CALCIUM 600+D) 600-400 MG-UNIT per tablet Take 1 tablet by mouth 2 (two) times daily.      Historical Provider, MD  chlorzoxazone (PARAFON) 500 MG tablet Take 500 mg by mouth 3 (three) times daily as needed for muscle spasms.    Historical Provider, MD  Cholecalciferol (VITAMIN D3) 3000 UNITS TABS Take 1 tablet by mouth daily. 07/26/14   Debbrah Alar, NP  cyclobenzaprine  (FLEXERIL) 5 MG tablet Take 1 tablet (5 mg total) by mouth 3 (three) times daily as needed for muscle spasms. 11/12/11   Debbrah Alar, NP  estradiol (ESTRACE) 0.1 MG/GM vaginal cream Place 2 g vaginally. Two times a week.     Historical Provider, MD  fluticasone (FLONASE) 50 MCG/ACT nasal spray Place 2 sprays into both nostrils daily. 01/24/16   Noland Fordyce, PA-C  ibuprofen (ADVIL,MOTRIN) 600 MG tablet Take 1 tablet (600 mg total) by mouth every 6 (six) hours as needed. 06/27/14   Dahlia Bailiff, PA-C  isometheptene-acetaminophen-dichloralphenazone (MIDRIN) 65-325-100 MG capsule Take 1-2 capsules by mouth 4 (four) times daily as needed for migraine. Maximum 5 capsules in 12 hours for migraine headaches, 8 capsules in 24 hours for tension headaches.    Historical Provider, MD  omeprazole-sodium bicarbonate (ZEGERID) 40-1100 MG capsule Take 1 capsule by mouth daily. 11/29/14   Debbrah Alar, NP  predniSONE (DELTASONE) 20 MG tablet 3 tabs po day one, then 2 po daily x 4 days 01/24/16   Noland Fordyce, PA-C  topiramate (TOPAMAX) 25 MG tablet Take 25 mg by mouth at bedtime. May increase by 1 tablet each week until you are taking 4 tablets at bedtime if needed.    Historical Provider, MD   Meds Ordered and Administered this Visit   Medications  acetaminophen (TYLENOL) tablet 975 mg (975 mg Oral Given 01/24/16 1541)    BP (!) 173/107 (BP Location: Left Arm)   Pulse 97   Temp 97.8 F (36.6 C) (Oral)   Resp 18   Ht 5' (1.524 m)   Wt 156 lb (70.8 kg)   SpO2 96%   BMI 30.47 kg/m  No data found.   Physical Exam  Constitutional: She appears well-developed and well-nourished. No distress.  HENT:  Head: Normocephalic and atraumatic.  Right Ear: Tympanic membrane normal.  Left Ear: Tympanic membrane normal.  Nose: Mucosal edema present. Right sinus exhibits maxillary sinus tenderness and frontal sinus tenderness. Left sinus exhibits maxillary sinus tenderness and frontal sinus tenderness.   Mouth/Throat: Uvula is midline, oropharynx is clear and moist and mucous membranes are normal.  Eyes: Conjunctivae are normal. No scleral icterus.  Neck: Normal range of motion. Neck supple.  Cardiovascular: Normal rate, regular rhythm and normal heart sounds.   Pulmonary/Chest: Effort normal and breath sounds normal. No stridor. No respiratory distress. She has no wheezes. She has no rales.  Abdominal: Soft. She exhibits no distension. There is no tenderness.  Musculoskeletal: Normal range of motion.  Lymphadenopathy:    She has cervical adenopathy.  Neurological: She is alert.  Skin: Skin is warm and dry. She is not diaphoretic.  Nursing note and vitals reviewed.   Urgent Care Course   Clinical Course  Procedures (including critical care time)  Labs Review Labs Reviewed - No data to display  Imaging Review No results found.   MDM   1. Acute rhinosinusitis   2. Elevated blood pressure reading    Pt c/o worsening sinus pain and pressure for about 6 days. BP also elevated, possibly due to nyquil or current headache.  Encouraged to f/u with PCP in 1-2 weeks for recheck of BP as some OTC cough/cold medications can elevated BP, but with family hx of HTN, she will likely need to be on BP medication.  Rx: Augmentin, prednisone, and Flonase Home care instructions provided. F/u with PCP in 1-2 weeks. Patient verbalized understanding and agreement with treatment plan.    Noland Fordyce, PA-C 01/24/16 [phone]

## 2016-02-08 ENCOUNTER — Encounter: Payer: Self-pay | Admitting: Osteopathic Medicine

## 2016-02-08 ENCOUNTER — Ambulatory Visit (INDEPENDENT_AMBULATORY_CARE_PROVIDER_SITE_OTHER): Payer: 59 | Admitting: Osteopathic Medicine

## 2016-02-08 VITALS — BP 150/85 | HR 92 | Ht 60.0 in | Wt 154.0 lb

## 2016-02-08 DIAGNOSIS — Z8669 Personal history of other diseases of the nervous system and sense organs: Secondary | ICD-10-CM

## 2016-02-08 DIAGNOSIS — E7800 Pure hypercholesterolemia, unspecified: Secondary | ICD-10-CM | POA: Diagnosis not present

## 2016-02-08 DIAGNOSIS — R030 Elevated blood-pressure reading, without diagnosis of hypertension: Secondary | ICD-10-CM

## 2016-02-08 DIAGNOSIS — K2100 Gastro-esophageal reflux disease with esophagitis, without bleeding: Secondary | ICD-10-CM

## 2016-02-08 MED ORDER — ATORVASTATIN CALCIUM 40 MG PO TABS: 40.0000 mg | ORAL_TABLET | Freq: Every day | ORAL | 3 refills | Status: DC

## 2016-02-08 MED ORDER — OMEPRAZOLE-SODIUM BICARBONATE 40-1100 MG PO CAPS: 1.0000 | ORAL_CAPSULE | Freq: Every day | ORAL | 3 refills | Status: DC

## 2016-02-08 MED FILL — ATORVASTATIN 40 MG TABLET: 40 | 90 days supply | Qty: 90 | Fill #0 | Status: TO

## 2016-02-08 NOTE — Progress Notes (Signed)
HPI: Whitney Neal is a 55 y.o. female  who presents to [place] today, 02/08/16,  for chief complaint of:  Chief Complaint  Patient presents with  . Establish Care    Pleasant patient here to establish care today. No complaints. Past medical history reviewed as below. Requests refills on some of her medications.  History of migraines: Patient has not had headaches in some time, she is off of her migraine medication, these were removed from the medication list.  GERD: Requests refill of PPI. History of Barrett's esophagus and gastric ulcer.  Hyperlipidemia: Requests refill on statin medication  Elevated blood pressure: Patient states that she has never been on blood pressure medications, hypertension is listed on her problem list. No chest pain, pressure, tightness of breath. Blood pressure actually a bit higher on manual recheck as below.   Past medical, surgical, social and family history reviewed: Patient Active Problem List   Diagnosis Date Noted  . Migraine 10/01/2011  . Numbness and tingling in left hand 01/05/2011  . Hyperglycemia   . Essential hypertension 01/25/2010  . GERD 01/25/2010  . COLONIC POLYPS, HYPERPLASTIC, HX OF 01/25/2010  . CERVICAL STRAIN 11/23/2009  . Hyperlipidemia 09/26/2009  . OSTEOPENIA 07/11/2009  . GASTRIC ULCER, HX OF 07/11/2009  . BARRETT'S ESOPHAGUS, HX OF 07/11/2009  . Vitamin D deficiency 07/04/2009  . CHEST PAIN, ATYPICAL, HX OF 07/04/2009   Past Surgical History:  Procedure Laterality Date  . ABDOMINAL HYSTERECTOMY  2009   Dr. Bethann Goo  . BLADDER SURGERY     bladder mesh surgery 2010- Dr. Rhodia Albright  . COLONOSCOPY W/ POLYPECTOMY  2010   Social History  Substance Use Topics  . Smoking status: Never Smoker  . Smokeless tobacco: Never Used  . Alcohol use No   Family History  Problem Relation Age of Onset  . Colon cancer Father   . Hypertension Father   . Heart attack Father 24  . Cancer Father      prostate CA  . Pulmonary embolism Mother     deceased  . Hypertension Mother   . Diabetes Sister   . Hypertension Brother   . Hypertension Sister   . Cancer Paternal Grandmother     oral  . Thyroid disease Sister     1/2 sister thyroid problem  . Healthy Daughter   . Heart attack Brother 42     Current medication list and allergy/intolerance information reviewed:   Current Outpatient Prescriptions  Medication Sig Dispense Refill  . aspirin 81 MG EC tablet Take 81 mg by mouth daily.      Marland Kitchen atorvastatin (LIPITOR) 40 MG tablet Take 1 tablet (40 mg total) by mouth daily. 90 tablet 3  . Calcium Carbonate-Vitamin D (CALCIUM 600+D) 600-400 MG-UNIT per tablet Take 1 tablet by mouth 2 (two) times daily.      . Cholecalciferol (VITAMIN D3) 3000 UNITS TABS Take 1 tablet by mouth daily.    Marland Kitchen estradiol (ESTRACE) 0.1 MG/GM vaginal cream Place 2 g vaginally. Two times a week.     . isometheptene-acetaminophen-dichloralphenazone (MIDRIN) 65-325-100 MG capsule Take 1-2 capsules by mouth 4 (four) times daily as needed for migraine. Maximum 5 capsules in 12 hours for migraine headaches, 8 capsules in 24 hours for tension headaches.    Marland Kitchen omeprazole-sodium bicarbonate (ZEGERID) 40-1100 MG capsule Take 1 capsule by mouth daily. 90 capsule 3  . topiramate (TOPAMAX) 25 MG tablet Take 25 mg by mouth at bedtime. May increase by 1 tablet  each week until you are taking 4 tablets at bedtime if needed.     No current facility-administered medications for this visit.    Allergies  Allergen Reactions  . Codeine     REACTION: headache, visual disturbance      Review of Systems:  Constitutional:  No  fever, no chills, No recent illness, No unintentional weight changes. No significant fatigue.   HEENT: No  headache, no vision change  Cardiac: No  chest pain, No  pressure, No palpitations, No  Orthopnea  Respiratory:  No  shortness of breath. No  Cough  Gastrointestinal: No  abdominal pain, No   nausea, No  vomiting,  No  blood in stool, No  diarrhea, No  constipation   Musculoskeletal: No new myalgia/arthralgia  Skin: No  Rash  Neurologic: No  weakness, No  dizziness  Psychiatric: No  concerns with depression,  Exam:  BP (!) 150/95   Pulse 92   Ht 5' (1.524 m)   Wt 154 lb (69.9 kg)   BMI 30.08 kg/m   Constitutional: VS see above. General Appearance: alert, well-developed, well-nourished, NAD  Eyes: Normal lids and conjunctive, non-icteric sclera  Ears, Nose, Mouth, Throat: MMM, Normal external inspection ears/nares/mouth/lips/gums.   Neck: No masses, trachea midline. No thyroid enlargement. No tenderness/mass appreciated. No lymphadenopathy  Respiratory: Normal respiratory effort. no wheeze, no rhonchi, no rales  Cardiovascular: S1/S2 normal, no murmur, no rub/gallop auscultated. RRR. No lower extremity edema.  Gastrointestinal: Nontender, no masses. No hepatomegaly, no splenomegaly. No hernia appreciated. Bowel sounds normal. Rectal exam deferred.   Musculoskeletal: Gait normal. No clubbing/cyanosis of digits.   Neurological: Normal balance/coordination. No tremor.  Skin: warm, dry, intact. No rash/ulcer.   Psychiatric: Normal judgment/insight. Normal mood and affect. Oriented x3.    Recent labs reviewed: See obstructive results. No major concerns    ASSESSMENT/PLAN:   Elevated blood pressure reading  High cholesterol - Plan: atorvastatin (LIPITOR) 40 MG tablet  History of migraine  Gastroesophageal reflux disease with esophagitis - Plan: omeprazole-sodium bicarbonate (ZEGERID) 40-1100 MG capsule      Visit summary with medication list and pertinent instructions was printed for patient to review. All questions at time of visit were answered - patient instructed to contact office with any additional concerns. ER/RTC precautions were reviewed with the patient. Follow-up plan: Return in about 3 months (around 05/08/2016) for RECHECK BLOOD PRESSURE  .

## 2016-02-14 ENCOUNTER — Encounter: Payer: Self-pay | Admitting: Osteopathic Medicine

## 2016-03-22 DIAGNOSIS — H5203 Hypermetropia, bilateral: Secondary | ICD-10-CM | POA: Diagnosis not present

## 2016-03-22 DIAGNOSIS — H52203 Unspecified astigmatism, bilateral: Secondary | ICD-10-CM | POA: Diagnosis not present

## 2016-03-22 DIAGNOSIS — H2513 Age-related nuclear cataract, bilateral: Secondary | ICD-10-CM | POA: Diagnosis not present

## 2016-03-22 DIAGNOSIS — H524 Presbyopia: Secondary | ICD-10-CM | POA: Diagnosis not present

## 2016-03-23 ENCOUNTER — Other Ambulatory Visit: Payer: Self-pay

## 2016-03-23 ENCOUNTER — Other Ambulatory Visit: Payer: Self-pay | Admitting: Family

## 2016-03-23 DIAGNOSIS — K2100 Gastro-esophageal reflux disease with esophagitis, without bleeding: Secondary | ICD-10-CM

## 2016-03-23 MED ORDER — OMEPRAZOLE-SODIUM BICARBONATE 40-1100 MG PO CAPS: 1.0000 | ORAL_CAPSULE | Freq: Every day | ORAL | 3 refills | Status: DC

## 2016-03-23 NOTE — Telephone Encounter (Signed)
Patient request refill for Omeprazole 40 -1100 mg. #[phone] refills sent to [facility]. Rhonda Cunningham,CMA

## 2016-03-26 MED FILL — OMEPRAZOLE DR 40 MG CAPSULE: 40 | 90 days supply | Qty: 90 | Fill #0

## 2016-04-03 ENCOUNTER — Telehealth: Payer: Self-pay | Admitting: Osteopathic Medicine

## 2016-04-03 NOTE — Telephone Encounter (Signed)
Zegerid not covered.  Insurance suggests Omeprazole (Prilosec), Lansoprazole (Prevacid), or Pantoprazole (Protonix). Can we see which of these the patient may have tried in the past and if she is ok with the substitution of one of these?

## 2016-04-03 NOTE — Telephone Encounter (Signed)
Left detailed message on patient vm advising that she call back and let us know about the medications. Chavela Justiniano,CMA

## 2016-04-05 NOTE — Telephone Encounter (Signed)
Called patient still no answer and a message was left for patient to call back. Rhonda Cunningham,CMA

## 2016-05-08 ENCOUNTER — Ambulatory Visit (INDEPENDENT_AMBULATORY_CARE_PROVIDER_SITE_OTHER): Payer: 59 | Admitting: Osteopathic Medicine

## 2016-05-08 ENCOUNTER — Encounter: Payer: Self-pay | Admitting: Osteopathic Medicine

## 2016-05-08 VITALS — BP 143/95 | HR 90 | Ht 60.0 in | Wt 152.0 lb

## 2016-05-08 DIAGNOSIS — I10 Essential (primary) hypertension: Secondary | ICD-10-CM

## 2016-05-08 MED ORDER — LOSARTAN POTASSIUM 25 MG PO TABS: 25.0000 mg | ORAL_TABLET | Freq: Every day | ORAL | 1 refills | Status: DC

## 2016-05-08 MED FILL — LOSARTAN POTASSIUM 25 MG TA: 25 | 60 days supply | Qty: 60 | Fill #0

## 2016-05-08 NOTE — Patient Instructions (Signed)
Plan:  In 2 weeks: appointment with me to recheck blood pressure and go over lab results, please get labs done fasting the day before your visit with me.  Any questions, let me know!

## 2016-05-08 NOTE — Progress Notes (Signed)
HPI: Whitney Neal is a 56 y.o. female  who presents to [place] today, 05/08/16,  for chief complaint of:  Chief Complaint  Patient presents with  . Follow-up    blood pressure     HTN: BP high few months ago but had been normal until that point except one high reading in UC. (+)FH in parents and siblings. (+)Headache, no CP/SOB   Past medical history, surgical history, social history and family history reviewed.  Patient Active Problem List   Diagnosis Date Noted  . Migraine 10/01/2011  . Numbness and tingling in left hand 01/05/2011  . Hyperglycemia   . Essential hypertension 01/25/2010  . GERD 01/25/2010  . COLONIC POLYPS, HYPERPLASTIC, HX OF 01/25/2010  . CERVICAL STRAIN 11/23/2009  . Hyperlipidemia 09/26/2009  . OSTEOPENIA 07/11/2009  . GASTRIC ULCER, HX OF 07/11/2009  . BARRETT'S ESOPHAGUS, HX OF 07/11/2009  . Vitamin D deficiency 07/04/2009  . CHEST PAIN, ATYPICAL, HX OF 07/04/2009    Current medication list and allergy/intolerance information reviewed.   Current Outpatient Prescriptions on File Prior to Visit  Medication Sig Dispense Refill  . aspirin 81 MG EC tablet Take 81 mg by mouth daily.      Marland Kitchen atorvastatin (LIPITOR) 40 MG tablet Take 1 tablet (40 mg total) by mouth daily. 90 tablet 3  . Calcium Carbonate-Vitamin D (CALCIUM 600+D) 600-400 MG-UNIT per tablet Take 1 tablet by mouth 2 (two) times daily.      Marland Kitchen omeprazole (PRILOSEC) 40 MG capsule TAKE 1 CAPSULE BY MOUTH ONCE A DAY (ALONG WITH SODIUM BICARBONATE TABLETS) 30 capsule 5  . omeprazole-sodium bicarbonate (ZEGERID) 40-1100 MG capsule Take 1 capsule by mouth daily. 90 capsule 3   No current facility-administered medications on file prior to visit.    Allergies  Allergen Reactions  . Codeine     REACTION: headache, visual disturbance      Review of Systems:  Constitutional: No recent illness  HEENT: +mild headache, no vision change  Cardiac: No  chest  pain, No  pressure, No palpitations  Respiratory:  No  shortness of breath.   Neurologic: No  weakness, No  Dizziness   Exam:  BP (!) 143/95   Pulse 90   Ht 5' (1.524 m)   Wt 152 lb (68.9 kg)   BMI 29.69 kg/m   Constitutional: VS see above. General Appearance: alert, well-developed, well-nourished, NAD  Respiratory: Normal respiratory effort. no wheeze, no rhonchi, no rales  Cardiovascular: S1/S2 normal, no murmur, no rub/gallop auscultated. RRR.   Musculoskeletal: Gait normal. Symmetric and independent movement of all extremities  Neurological: Normal balance/coordination. No tremor.  Skin: warm, dry, intact.   Psychiatric: Normal judgment/insight. Normal mood and affect. Oriented x3.      ASSESSMENT/PLAN:   Essential hypertension - Plan: losartan (COZAAR) 25 MG tablet, COMPLETE METABOLIC PANEL WITH GFR, Lipid panel    Patient Instructions  Plan:  In 2 weeks: appointment with me to recheck blood pressure and go over lab results, please get labs done fasting the day before your visit with me.  Any questions, let me know!     Follow-up plan: Return for BLOOD PRESSURE FOLLOWUP .  Visit summary with medication list and pertinent instructions was printed for patient to review, alert [place] if any changes needed. All questions at time of visit were answered - patient instructed to contact office with any additional concerns. ER/RTC precautions were reviewed with the patient and understanding verbalized.

## 2016-05-14 DIAGNOSIS — I10 Essential (primary) hypertension: Secondary | ICD-10-CM | POA: Diagnosis not present

## 2016-05-14 LAB — COMPLETE METABOLIC PANEL WITH GFR
ALT: 14 U/L (ref 6–29)
AST: 14 U/L (ref 10–35)
Albumin: 3.8 g/dL (ref 3.6–5.1)
Alkaline Phosphatase: 82 U/L (ref 33–130)
BUN: 14 mg/dL (ref 7–25)
CO2: 25 mmol/L (ref 20–31)
Calcium: 9.5 mg/dL (ref 8.6–10.4)
Chloride: 108 mmol/L (ref 98–110)
Creat: 0.61 mg/dL (ref 0.50–1.05)
GFR, Est African American: >89 mL/min (ref >=60)
GFR, Est Non African American: >89 mL/min (ref >=60)
Glucose, Bld: 105 mg/dL — ABNORMAL HIGH (ref 65–99)
Potassium: 4.5 mmol/L (ref 3.5–5.3)
Sodium: 142 mmol/L (ref 135–146)
Total Bilirubin: 0.4 mg/dL (ref 0.2–1.2)
Total Protein: 6.6 g/dL (ref 6.1–8.1)

## 2016-05-14 LAB — LIPID PANEL
Cholesterol: 173 mg/dL (ref <200)
HDL: 43 mg/dL — ABNORMAL LOW (ref >50)
LDL Cholesterol: 104 mg/dL — ABNORMAL HIGH (ref <100)
Total CHOL/HDL Ratio: 4 Ratio (ref <5.0)
Triglycerides: 131 mg/dL (ref <150)
VLDL: 26 mg/dL (ref <30)

## 2016-05-23 ENCOUNTER — Ambulatory Visit (INDEPENDENT_AMBULATORY_CARE_PROVIDER_SITE_OTHER): Payer: 59 | Admitting: Osteopathic Medicine

## 2016-05-23 ENCOUNTER — Encounter: Payer: Self-pay | Admitting: Osteopathic Medicine

## 2016-05-23 VITALS — BP 125/80 | HR 100 | Wt 152.0 lb

## 2016-05-23 DIAGNOSIS — Z7189 Other specified counseling: Secondary | ICD-10-CM | POA: Diagnosis not present

## 2016-05-23 DIAGNOSIS — E785 Hyperlipidemia, unspecified: Secondary | ICD-10-CM

## 2016-05-23 DIAGNOSIS — R7301 Impaired fasting glucose: Secondary | ICD-10-CM | POA: Diagnosis not present

## 2016-05-23 DIAGNOSIS — I10 Essential (primary) hypertension: Secondary | ICD-10-CM | POA: Diagnosis not present

## 2016-05-23 LAB — POCT GLYCOSYLATED HEMOGLOBIN (HGB A1C): Hemoglobin A1C: 5.7

## 2016-05-23 NOTE — Progress Notes (Signed)
HPI: Whitney Neal is a 55 y.o. female  who presents to [place] today, 05/23/16,  for chief complaint of:  Chief Complaint  Patient presents with  . Follow-up    BLOOD PRESSURE     HTN: BP high few months ago but had been normal until that point except one high reading in UC. (+)FH in parents and siblings. (+)Headache at last visit, today headache gone, no CP/SOB. Last visit stated antihypertensives w/ Losartan.   Hyperlipidemia: Patient states will work on diet/exercise, history of high cholesterol runs in her family.  Elevated fasting glucose: We'll get A1c today. Patient states that she doesn't eat much in the way of carbs except red on occasion. History of diabetes runs in her family.  Past medical history, surgical history, social history and family history reviewed.  Patient Active Problem List   Diagnosis Date Noted  . Migraine 10/01/2011  . Numbness and tingling in left hand 01/05/2011  . Hyperglycemia   . Essential hypertension 01/25/2010  . GERD 01/25/2010  . COLONIC POLYPS, HYPERPLASTIC, HX OF 01/25/2010  . CERVICAL STRAIN 11/23/2009  . Hyperlipidemia 09/26/2009  . OSTEOPENIA 07/11/2009  . GASTRIC ULCER, HX OF 07/11/2009  . BARRETT'S ESOPHAGUS, HX OF 07/11/2009  . Vitamin D deficiency 07/04/2009  . CHEST PAIN, ATYPICAL, HX OF 07/04/2009    Current medication list and allergy/intolerance information reviewed.   Current Outpatient Prescriptions on File Prior to Visit  Medication Sig Dispense Refill  . aspirin 81 MG EC tablet Take 81 mg by mouth daily.      Marland Kitchen atorvastatin (LIPITOR) 40 MG tablet Take 1 tablet (40 mg total) by mouth daily. 90 tablet 3  . Calcium Carbonate-Vitamin D (CALCIUM 600+D) 600-400 MG-UNIT per tablet Take 1 tablet by mouth 2 (two) times daily.      Marland Kitchen losartan (COZAAR) 25 MG tablet Take 1 tablet (25 mg total) by mouth daily. 30 tablet 1  . omeprazole-sodium bicarbonate (ZEGERID) 40-1100 MG capsule Take 1  capsule by mouth daily. 90 capsule 3   No current facility-administered medications on file prior to visit.    Allergies  Allergen Reactions  . Codeine     REACTION: headache, visual disturbance      Review of Systems:  Constitutional: No recent illness  HEENT: No headache, no vision change  Cardiac: No  chest pain, No  pressure, No palpitations  Respiratory:  No  shortness of breath.   Neurologic: No  weakness, No  Dizziness   Exam:  BP 125/80   Pulse 100   Wt 152 lb (68.9 kg)   BMI 29.69 kg/m   Constitutional: VS see above. General Appearance: alert, well-developed, well-nourished, NAD  Respiratory: Normal respiratory effort. no wheeze, no rhonchi, no rales  Cardiovascular: S1/S2 normal, no murmur, no rub/gallop auscultated. RRR.   Musculoskeletal: Gait normal. Symmetric and independent movement of all extremities  Neurological: Normal balance/coordination. No tremor.  Skin: warm, dry, intact.   Psychiatric: Normal judgment/insight. Normal mood and affect. Oriented x3.   Recent Results (from the past 2160 hour(s))  COMPLETE METABOLIC PANEL WITH GFR     Status: Abnormal   Collection Time: 05/14/16  5:26 PM  Result Value Ref Range   Sodium 142 135 - 146 mmol/L   Potassium 4.5 3.5 - 5.3 mmol/L   Chloride 108 98 - 110 mmol/L   CO2 25 20 - 31 mmol/L   Glucose, Bld 105 (H) 65 - 99 mg/dL   BUN 14 7 - 25  mg/dL   Creat 0.61 0.50 - 1.05 mg/dL    Comment:   For patients > or = 57 years of age: The upper reference limit for Creatinine is approximately 13% higher for people identified as African-American.      Total Bilirubin 0.4 0.2 - 1.2 mg/dL   Alkaline Phosphatase 82 33 - 130 U/L   AST 14 10 - 35 U/L   ALT 14 6 - 29 U/L   Total Protein 6.6 6.1 - 8.1 g/dL   Albumin 3.8 3.6 - 5.1 g/dL   Calcium 9.5 8.6 - 10.4 mg/dL   GFR, Est African American >89 >=60 mL/min   GFR, Est Non African American >89 >=60 mL/min  Lipid panel     Status: Abnormal   Collection  Time: 05/14/16  5:26 PM  Result Value Ref Range   Cholesterol 173 <200 mg/dL   Triglycerides 131 <150 mg/dL   HDL 43 (L) >50 mg/dL   Total CHOL/HDL Ratio 4.0 <5.0 Ratio   VLDL 26 <30 mg/dL   LDL Cholesterol 104 (H) <100 mg/dL   Results for orders placed or performed in visit on 05/23/16 (from the past 24 hour(s))  POCT HgB A1C     Status: None   Collection Time: 05/23/16  3:25 PM  Result Value Ref Range   Hemoglobin A1C 5.7       ASSESSMENT/PLAN:   Essential hypertension - Continue losartan. Recheck BMP in 1 month - Plan: BASIC METABOLIC PANEL WITH GFR  Cardiac risk counseling - ASCVD 10-year risk 3.7%  Elevated fasting glucose - A1c in prediabetic range, lifestyle modifications discussed, if A1c stable/increasing will consider medications - Plan: POCT HgB A1C  Hyperlipidemia, unspecified hyperlipidemia type - Lifestyle modifications discussed, not in statin benefit group      Follow-up plan: Return in about 3 months (around 08/22/2016) for recheck sugars .  Visit summary with medication list and pertinent instructions was printed for patient to review, alert [place] if any changes needed. All questions at time of visit were answered - patient instructed to contact office with any additional concerns. ER/RTC precautions were reviewed with the patient and understanding verbalized.

## 2016-06-25 DIAGNOSIS — I10 Essential (primary) hypertension: Secondary | ICD-10-CM | POA: Diagnosis not present

## 2016-06-26 LAB — BASIC METABOLIC PANEL WITH GFR
BUN: 11 mg/dL (ref 7–25)
CO2: 25 mmol/L (ref 20–31)
Calcium: 9.3 mg/dL (ref 8.6–10.4)
Chloride: 108 mmol/L (ref 98–110)
Creat: 0.63 mg/dL (ref 0.50–1.05)
GFR, Est African American: >89 mL/min (ref >=60)
GFR, Est Non African American: >89 mL/min (ref >=60)
Glucose, Bld: 102 mg/dL — ABNORMAL HIGH (ref 65–99)
Potassium: 4.6 mmol/L (ref 3.5–5.3)
Sodium: 142 mmol/L (ref 135–146)

## 2016-07-13 ENCOUNTER — Other Ambulatory Visit: Payer: Self-pay | Admitting: Osteopathic Medicine

## 2016-07-13 DIAGNOSIS — I10 Essential (primary) hypertension: Secondary | ICD-10-CM

## 2016-07-17 MED FILL — LOSARTAN POTASSIUM 25 MG TA: 25 | 30 days supply | Qty: 30 | Fill #0

## 2016-08-15 MED FILL — ATORVASTATIN 40 MG TABLET: 40 | 90 days supply | Qty: 90 | Fill #0

## 2016-08-15 MED FILL — LOSARTAN POTASSIUM 25 MG TA: 25 | 30 days supply | Qty: 30 | Fill #1

## 2016-08-17 ENCOUNTER — Other Ambulatory Visit: Payer: Self-pay | Admitting: Osteopathic Medicine

## 2016-08-17 DIAGNOSIS — Z1231 Encounter for screening mammogram for malignant neoplasm of breast: Secondary | ICD-10-CM

## 2016-08-23 ENCOUNTER — Encounter: Payer: Self-pay | Admitting: Osteopathic Medicine

## 2016-08-23 ENCOUNTER — Ambulatory Visit (INDEPENDENT_AMBULATORY_CARE_PROVIDER_SITE_OTHER): Payer: 59 | Admitting: Osteopathic Medicine

## 2016-08-23 VITALS — BP 108/65 | HR 86 | Ht 61.25 in | Wt 153.0 lb

## 2016-08-23 DIAGNOSIS — R7303 Prediabetes: Secondary | ICD-10-CM | POA: Diagnosis not present

## 2016-08-23 DIAGNOSIS — K2100 Gastro-esophageal reflux disease with esophagitis, without bleeding: Secondary | ICD-10-CM

## 2016-08-23 DIAGNOSIS — R7301 Impaired fasting glucose: Secondary | ICD-10-CM

## 2016-08-23 LAB — POCT GLYCOSYLATED HEMOGLOBIN (HGB A1C): Hemoglobin A1C: 5.8

## 2016-08-23 MED ORDER — OMEPRAZOLE-SODIUM BICARBONATE 40-1100 MG PO CAPS: 1.0000 | ORAL_CAPSULE | Freq: Every day | ORAL | 3 refills | Status: DC

## 2016-08-23 MED ORDER — METFORMIN HCL ER 500 MG PO TB24: 500.0000 mg | ORAL_TABLET | Freq: Every day | ORAL | 3 refills | Status: DC

## 2016-08-23 MED FILL — METFORMIN HCL ER 500 MG TAB: 500 | 90 days supply | Qty: 90 | Fill #0

## 2016-08-23 NOTE — Patient Instructions (Signed)
Prevencin de la diabetes mellitus tipo2 (Preventing Type 2 Diabetes Mellitus) La diabetes tipo2 (diabetes mellitus tipo2) es una enfermedad a largo plazo (crnica) que afecta los niveles de azcar en la sangre (glucosa). Normalmente, una hormona llamada insulina estimula el ingreso de la glucosa en las clulas del cuerpo. Las clulas usan la glucosa para Dealer. En la diabetes tipo2, puede presentarse uno de los siguientes problemas, o ambos:  El organismo no produce la cantidad suficiente de [place].  El organismo no responde de [place] a la insulina que produce (resistencia a la insulina). La resistencia a la insulina o la falta de esta hormona hace que el exceso de glucosa se acumule en la sangre, en lugar de ir a las clulas. Como consecuencia, se desarrolla glucemia alta (hiperglucemia), que puede causar muchas complicaciones. El sobrepeso o la obesidad, y Catering manager un estilo de vida inactivo (sedentario) pueden aumentar el riesgo de tener diabetes. La diabetes tipo2 se puede retardar o evitar al realizar ciertos cambios en la alimentacin y en el estilo de vida. QU CAMBIOS EN LA ALIMENTACIN SE PUEDEN HACER?  Consuma comidas y colaciones saludables regularmente. Lleve con usted una colacin saludable para cuando tenga [employer], por Radisson, una fruta o un puado de frutos secos.  Coma carne [place] y protenas con bajo contenido de grasas saturadas, como pollo, pescado, huevos blancos y frijoles. Evite las carnes procesadas.  Coma mucha fruta y verdura, y Ardelia Mems cantidad importante de cereales no procesados (cereales integrales). Se recomienda que consuma lo siguiente: ? De 1 a 2tazas de frutas [employer]. ? De 2 a 3tazas de [employer]. ? [place] (170g) a 8onzas (227g) de cereales integrales todos los [place], como avena, [place], trigo [place], arroz integral, quinua y mijo.  Productos lcteos con bajo contenido de [place], [place], yogur y [place].  Alimentos que contengan grasas saludables, como frutos secos, Musician, aceite de [place] y aceite de canola.  Beba agua [place]. Evite bebidas que contengan ms azcar, como gaseosas y t [place].  Siga las indicaciones del mdico con respecto a las restricciones especficas para las comidas o bebidas.  Controle la cantidad de comida que consume en un momento dado (tamao de la porcin). ? Revise las etiquetas de los alimentos para conocer el tamao de la porcin. ? Utilice una balanza de cocina para pesar las cantidades de alimentos.  [place]tee o cocine al vapor los alimentos en vez de frerlos. Cocine con agua o caldo en vez de aceite o manteca.  Limite la ingesta de lo siguiente: ? Sal (sodio). No consuma ms de 1cucharadita (2400mg ) de Electrical engineer. Si tiene [place] cardiopata o hipertensin arterial, consuma menos de  o de cucharadita (1500mg ) de sodio por da. ? Grasas saturadas. Es la grasa que se encuentra en estado slido a temperatura ambiente, como la [place] o la grasa de la carne. QU CAMBIOS EN EL ESTILO DE VIDA SE PUEDEN HACER? Actividad  Haga actividad fsica de intensidad moderada durante al menos 54minutos como mnimo 5das por semana, o tanto como le haya indicado el mdico.  Pregntele al mdico qu actividades son seguras para usted. Una combinacin de actividades puede ser la mejor opcin, por ejemplo, caminar, practicar natacin, andar en bicicleta y hacer entrenamiento de fuerza.  Trate de agregar la actividad fsica a Conservation officer, nature. Por ejemplo: ? Estacione en lugares que estn ms alejados de lo habitual para poder caminar ms. Por ejemplo, estacione en una esquina  alejada del estacionamiento cuando vaya a la oficina o a la tienda de comestibles. ? D una caminata durante su hora de almuerzo. ? Utilice las Clinical cytogeneticist del ascensor o de las escaleras mecnicas. Prdida de peso  Baje de peso segn se le indique. El mdico  puede determinar cuntos kilos tiene que bajar y [place] a que adelgace de Geographical information systems officer segura.  Si tiene sobrepeso u obesidad, es posible que se le indique bajar, por lo menos del 5% al 7% del Engineer, site. Alcohol y tabaco  Limite el consumo de alcohol a no ms de 1 medida por da si es mujer y no est Music therapist, y 2 medidas por da si es hombre. Una medida equivale a 12onzas de cerveza, 5onzas de vino o 1onzas de bebidas alcohlicas de alta graduacin.  No consuma ningn producto que contenga tabaco, lo que incluye cigarrillos, tabaco de Higher education careers adviser y Psychologist, sport and exercise. Si necesita ayuda para dejar de fumar, consulte al [employer]. [place] con el mdico  Contrlese el nivel sanguneo de glucosa con frecuencia como se lo haya indicado el mdico.  Analice los factores de riesgo y cmo puede reducir el riesgo de tener diabetes.  Hgase las pruebas de [employer] se lo haya indicado el mdico. Puede hacerse pruebas de deteccin de forma peridica, especialmente si presenta ciertos factores de riesgo para la diabetes tipo2.  Haga una cita con un especialista en alimentacin y nutricin (nutricionista certificado). Un nutricionista certificado puede ayudarlo a preparar un plan de alimentacin saludable, y a comprender los tamaos de las porciones y las etiquetas de los alimentos. POR QU ESTOS CAMBIOS SON IMPORTANTES?  Al hacer cambios en el estilo de vida y la alimentacin, es posible prevenir o retardar la diabetes tipo2 y los problemas de salud relacionados.  Puede ser difcil reconocer los signos de la diabetes tipo2. La mejor manera de evitar los posibles daos al organismo es tomar medidas para prevenir la enfermedad antes de presentar sntomas. QU PUEDE SUCEDER SI NO SE REALIZAN CAMBIOS?  Los niveles sanguneos de glucosa pueden seguir aumentando. Es peligroso Systems analyst glucemia alta durante mucho tiempo. Demasiada glucosa en la sangre puede daar los vasos sanguneos, el  corazn, los riones, los nervios y los ojos.  Puede desarrollar prediabetes o diabetes tipo2. La diabetes tipo2 puede producir muchos problemas de salud crnicos y complicaciones, por ejemplo: ? Cardiopata. ? Ictus. ? Ceguera. ? Enfermedad renal. ? Depresin. ? Mala [employer] y en las piernas, que podra llevar a la extraccin quirrgica (amputacin) en casos graves. DNDE ENCONTRAR ASISTENCIA:  Pdale al mdico que le recomiende a un nutricionista certificado, a Radio broadcast assistant para el cuidado de la diabetes o un programa para Sports coach de [place].  Busque grupos para bajar de peso locales o en lnea.  Inscrbase en un gimnasio, club de preparacin fsica o grupo de actividades al [employer], [place] un club para salir a Writer. DNDE ENCONTRAR MS INFORMACIN: Para obtener ms informacin sobre la diabetes y la prevencin de la diabetes, visite los siguientes sitios web:  Asociacin Americana de la Diabetes (American Diabetes Association, ADA): www.diabetes.[place] Diabetes y las Enfermedades Digestivas y [facility] of Diabetes and Digestive and Kidney Diseases): FindSpin.nl Para obtener ms informacin sobre una alimentacin saludable, visite los siguientes sitios web:  Choose My Plate (MiPlato), Departamento de Agricultura de EE.UU. (U.S. Department of Agriculture, Engineer, structural): http://wiley-williams.com/  [employer] Dietary Guidelines ([place]as de alimentacin) de la Oficina de Prevencin de [place] y Promocin de  la [employer] International Barista of Disease Prevention and Health Promotion, [place]): SurferLive.at Resumen  Puede reducir el riesgo de desarrollar diabetes tipo2 al aumentar la actividad fsica, comer alimentos saludables y bajar de [place], segn se le indique.  Hable con el mdico sobre el riesgo de desarrollar diabetes tipo2. Pregntele [employer]  de sangre o las pruebas de  deteccin que deba [place]. Esta informacin no tiene Marine scientist el consejo del mdico. Asegrese de hacerle al mdico cualquier pregunta que tenga. Document Released: 03/29/2015 Document Revised: 03/29/2015 Document Reviewed: 03/29/2015 Elsevier Interactive Patient Education  Henry Schein.

## 2016-08-23 NOTE — Progress Notes (Signed)
HPI: Whitney Neal is a 56 y.o. female  who presents to [place] today, 08/23/16,  for chief complaint of:  Chief Complaint  Patient presents with  . Follow-up    Prediabetes:  A1C last visit 5.7, recheck today was 5.8 Patient is open to starting medications at this point in addition to lifestyle modifications.   Past medical history, surgical history, social history and family history reviewed.  Patient Active Problem List   Diagnosis Date Noted  . Elevated fasting glucose 05/23/2016  . Cardiac risk counseling 05/23/2016  . Migraine 10/01/2011  . Numbness and tingling in left hand 01/05/2011  . Hyperglycemia   . Essential hypertension 01/25/2010  . GERD 01/25/2010  . COLONIC POLYPS, HYPERPLASTIC, HX OF 01/25/2010  . CERVICAL STRAIN 11/23/2009  . Hyperlipidemia 09/26/2009  . OSTEOPENIA 07/11/2009  . GASTRIC ULCER, HX OF 07/11/2009  . BARRETT'S ESOPHAGUS, HX OF 07/11/2009  . Vitamin D deficiency 07/04/2009  . CHEST PAIN, ATYPICAL, HX OF 07/04/2009    Current medication list and allergy/intolerance information reviewed.   Current Outpatient Prescriptions on File Prior to Visit  Medication Sig Dispense Refill  . aspirin 81 MG EC tablet Take 81 mg by mouth daily.      Marland Kitchen atorvastatin (LIPITOR) 40 MG tablet Take 1 tablet (40 mg total) by mouth daily. 90 tablet 3  . Calcium Carbonate-Vitamin D (CALCIUM 600+D) 600-400 MG-UNIT per tablet Take 1 tablet by mouth 2 (two) times daily.      Marland Kitchen losartan (COZAAR) 25 MG tablet TAKE 1 TABLET (25 MG TOTAL) BY MOUTH DAILY. 30 tablet 1  . omeprazole-sodium bicarbonate (ZEGERID) 40-1100 MG capsule Take 1 capsule by mouth daily. 90 capsule 3   No current facility-administered medications on file prior to visit.    Allergies  Allergen Reactions  . Codeine     REACTION: headache, visual disturbance      Review of Systems:  Constitutional: No recent illness  HEENT: No headache, no vision  change  Cardiac: No  chest pain, No  pressure, No palpitations  Respiratory:  No  shortness of breath.   Neurologic: No  weakness, No  Dizziness   Exam:  BP 108/65   Pulse 86   Ht 5' 1.25" (1.556 m)   Wt 153 lb (69.4 kg)   SpO2 98%   BMI 28.67 kg/m   Constitutional: VS see above. General Appearance: alert, well-developed, well-nourished, NAD  Respiratory: Normal respiratory effort. no wheeze, no rhonchi, no rales  Cardiovascular: S1/S2 normal, no murmur, no rub/gallop auscultated. RRR.   Musculoskeletal: Gait normal. Symmetric and independent movement of all extremities  Neurological: Normal balance/coordination. No tremor.  Skin: warm, dry, intact.   Psychiatric: Normal judgment/insight. Normal mood and affect. Oriented x3.    Results for orders placed or performed in visit on 08/23/16 (from the past 24 hour(s))  POCT HgB A1C     Status: None   Collection Time: 08/23/16  3:36 PM  Result Value Ref Range   Hemoglobin A1C 5.8       ASSESSMENT/PLAN:   Prediabetes - Plan: POCT HgB A1C, metFORMIN (GLUCOPHAGE XR) 500 MG 24 hr tablet  Gastroesophageal reflux disease with esophagitis - Plan: omeprazole-sodium bicarbonate (ZEGERID) 40-1100 MG capsule      Follow-up plan: Return in about 4 months (around 12/24/2016) for recheck sugar levels and blood pressure, sooner if you need me!.  Visit summary with medication list and pertinent instructions was printed for patient to review, alert [place] if any  changes needed. All questions at time of visit were answered - patient instructed to contact office with any additional concerns. ER/RTC precautions were reviewed with the patient and understanding verbalized.    Total time spent 15 minutes, greater than 50% of the visit was counseling and coordinating care for diagnosis of Prediabetes.

## 2016-08-27 ENCOUNTER — Telehealth: Payer: Self-pay | Admitting: Osteopathic Medicine

## 2016-08-27 MED ORDER — SODIUM BICARBONATE 325 MG PO TABS: 325.0000 mg | ORAL_TABLET | Freq: Two times a day (BID) | ORAL | 11 refills | Status: DC

## 2016-08-27 MED ORDER — OMEPRAZOLE 40 MG PO CPDR: 40.0000 mg | DELAYED_RELEASE_CAPSULE | Freq: Every day | ORAL | 3 refills | Status: DC

## 2016-08-27 MED FILL — OMEPRAZOLE DR 40 MG CAPSULE: 40 | 30 days supply | Qty: 30 | Fill #0

## 2016-08-27 MED FILL — SODIUM BICARB 10 GRAIN TAB: 650 | 90 days supply | Qty: 180 | Fill #0

## 2016-08-27 NOTE — Telephone Encounter (Signed)
Spoke with pharmacy, they have the Rx ready for Pt.

## 2016-08-27 NOTE — Telephone Encounter (Signed)
Received call from [facility] that Rx for Zegerid is too expensive, request Rx be sent in separately.

## 2016-08-27 NOTE — Telephone Encounter (Signed)
Sent!

## 2016-08-30 ENCOUNTER — Encounter (INDEPENDENT_AMBULATORY_CARE_PROVIDER_SITE_OTHER): Payer: Self-pay

## 2016-08-30 ENCOUNTER — Ambulatory Visit
Admission: RE | Admit: 2016-08-30 | Discharge: 2016-08-30 | Disposition: A | Discharge status: Home / Self Care | Payer: 59 | Source: Ambulatory Visit | Attending: Osteopathic Medicine | Admitting: Osteopathic Medicine

## 2016-08-30 DIAGNOSIS — Z1231 Encounter for screening mammogram for malignant neoplasm of breast: Secondary | ICD-10-CM | POA: Diagnosis not present

## 2016-09-17 ENCOUNTER — Other Ambulatory Visit: Payer: Self-pay | Admitting: Osteopathic Medicine

## 2016-09-17 DIAGNOSIS — I10 Essential (primary) hypertension: Secondary | ICD-10-CM

## 2016-09-17 MED FILL — LOSARTAN POTASSIUM 25 MG TA: 25 | 90 days supply | Qty: 90 | Fill #0

## 2016-11-20 ENCOUNTER — Other Ambulatory Visit: Payer: Self-pay | Admitting: Osteopathic Medicine

## 2016-11-20 DIAGNOSIS — I10 Essential (primary) hypertension: Secondary | ICD-10-CM

## 2016-11-20 MED FILL — METFORMIN HCL ER 500 MG TAB: 500 | 90 days supply | Qty: 90 | Fill #1

## 2016-12-27 ENCOUNTER — Encounter: Payer: Self-pay | Admitting: Osteopathic Medicine

## 2016-12-27 ENCOUNTER — Ambulatory Visit (INDEPENDENT_AMBULATORY_CARE_PROVIDER_SITE_OTHER): Payer: 59 | Admitting: Osteopathic Medicine

## 2016-12-27 VITALS — BP 138/92 | HR 80 | Temp 98.0°F | Resp 16 | Wt 150.5 lb

## 2016-12-27 DIAGNOSIS — R7303 Prediabetes: Secondary | ICD-10-CM

## 2016-12-27 DIAGNOSIS — I10 Essential (primary) hypertension: Secondary | ICD-10-CM | POA: Diagnosis not present

## 2016-12-27 LAB — POCT GLYCOSYLATED HEMOGLOBIN (HGB A1C): Hemoglobin A1C: 5.8

## 2016-12-27 MED ORDER — LOSARTAN POTASSIUM 25 MG PO TABS: 25.0000 mg | ORAL_TABLET | Freq: Every day | ORAL | 3 refills | Status: DC

## 2016-12-27 MED FILL — LOSARTAN POTASSIUM 25 MG TA: 25 | 90 days supply | Qty: 90 | Fill #0

## 2016-12-27 NOTE — Progress Notes (Signed)
HPI: Whitney Neal is a 57 y.o. female  who presents to [place] today, 12/27/16,  for chief complaint of:  Chief Complaint  Patient presents with  . Hypertension    follow up  . Diabetes    follow up    Prediabetes:  A1C previous visit 5.7, recheck last visit was 5.8, stable today at 5.8. Doing well on Metformin.   BP: BP a bit high today. Stressful night at work, only got 4 hrs sleep today. 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     Past medical history, surgical history, social history and family history reviewed.  Patient Active Problem List   Diagnosis Date Noted  . Elevated fasting glucose 05/23/2016  . Cardiac risk counseling 05/23/2016  . Migraine 10/01/2011  . Numbness and tingling in left hand 01/05/2011  . Hyperglycemia   . Essential hypertension 01/25/2010  . GERD 01/25/2010  . COLONIC POLYPS, HYPERPLASTIC, HX OF 01/25/2010  . CERVICAL STRAIN 11/23/2009  . Hyperlipidemia 09/26/2009  . OSTEOPENIA 07/11/2009  . GASTRIC ULCER, HX OF 07/11/2009  . BARRETT'S ESOPHAGUS, HX OF 07/11/2009  . Vitamin D deficiency 07/04/2009  . CHEST PAIN, ATYPICAL, HX OF 07/04/2009    Current medication list and allergy/intolerance information reviewed.   Current Outpatient Medications on File Prior to Visit  Medication Sig Dispense Refill  . aspirin 81 MG EC tablet Take 81 mg by mouth daily.      Marland Kitchen atorvastatin (LIPITOR) 40 MG tablet Take 1 tablet (40 mg total) by mouth daily. 90 tablet 3  . Calcium Carbonate-Vitamin D (CALCIUM 600+D) 600-400 MG-UNIT per tablet Take 1 tablet by mouth 2 (two) times daily.      Marland Kitchen losartan (COZAAR) 25 MG tablet TAKE 1 TABLET BY MOUTH DAILY 90 tablet 0  . metFORMIN (GLUCOPHAGE XR) 500 MG 24 hr tablet Take 1 tablet (500 mg total) by mouth daily with breakfast. 90 tablet 3  . omeprazole (PRILOSEC) 40 MG capsule Take 1 capsule (40 mg total) by mouth daily. 30 capsule 3  . omeprazole-sodium bicarbonate  (ZEGERID) 40-1100 MG capsule Take 1 capsule by mouth daily. 90 capsule 3  . sodium bicarbonate 325 MG tablet Take 1-2 tablets (325-650 mg total) by mouth 2 (two) times daily. Prn heartburn 180 tablet 11   No current facility-administered medications on file prior to visit.    Allergies  Allergen Reactions  . Codeine     REACTION: headache, visual disturbance      Review of Systems:  Constitutional: No recent illness  HEENT: No headache, no vision change  Cardiac: No  chest pain, No  pressure, No palpitations  Respiratory:  No  shortness of breath.   Neurologic: No  weakness, No  Dizziness   Exam:  BP (!) 138/92 (BP Location: Left Arm, Patient Position: Sitting, Cuff Size: Small)   Pulse 80   Temp 98 F (36.7 C) (Oral)   Resp 16   Wt 150 lb 8 oz (68.3 kg)   SpO2 99%   BMI 28.21 kg/m   Constitutional: VS see above. General Appearance: alert, well-developed, well-nourished, NAD  Respiratory: Normal respiratory effort. no wheeze, no rhonchi, no rales  Cardiovascular: S1/S2 normal, no murmur, no rub/gallop auscultated. RRR.   Musculoskeletal: Gait normal. Symmetric and independent movement of all extremities  Neurological: Normal balance/coordination. No tremor.  Skin: warm, dry, intact.   Psychiatric: Normal judgment/insight. Normal mood and affect. Oriented x3.      ASSESSMENT/PLAN:  Prediabetes - stable, tolerating metformin, can f/u 4-6 mos - Plan: POCT glycosylated hemoglobin (Hb A1C)  Essential hypertension - goals discussed, will check BP at work and if >130/80 goal will need to adjust medications or consider BID/increased losartan  - Plan: losartan (COZAAR) 25 MG tablet      Follow-up plan: Return in about 6 months (around 06/26/2017) for [place], sooner if needed .  Visit summary with medication list and pertinent instructions was printed for patient to review, alert [place] if any changes needed. All questions at time of visit were answered -  patient instructed to contact office with any additional concerns. ER/RTC precautions were reviewed with the patient and understanding verbalized.

## 2016-12-28 ENCOUNTER — Encounter: Payer: Self-pay | Admitting: Osteopathic Medicine

## 2016-12-28 ENCOUNTER — Ambulatory Visit (INDEPENDENT_AMBULATORY_CARE_PROVIDER_SITE_OTHER): Payer: 59 | Admitting: Osteopathic Medicine

## 2016-12-28 ENCOUNTER — Telehealth: Payer: Self-pay | Admitting: Internal Medicine

## 2016-12-28 VITALS — BP 146/91 | HR 74 | Temp 98.1°F

## 2016-12-28 DIAGNOSIS — K2100 Gastro-esophageal reflux disease with esophagitis, without bleeding: Secondary | ICD-10-CM

## 2016-12-28 DIAGNOSIS — Z8711 Personal history of peptic ulcer disease: Secondary | ICD-10-CM

## 2016-12-28 DIAGNOSIS — R1013 Epigastric pain: Secondary | ICD-10-CM | POA: Diagnosis not present

## 2016-12-28 DIAGNOSIS — Z8719 Personal history of other diseases of the digestive system: Secondary | ICD-10-CM | POA: Diagnosis not present

## 2016-12-28 MED ORDER — SUCRALFATE 1 GM/10ML PO SUSP: 1.0000 g | Freq: Three times a day (TID) | ORAL | 1 refills | Status: DC

## 2016-12-28 MED ORDER — GI COCKTAIL ~~LOC~~
30.0000 mL | Freq: Once | ORAL | Status: AC
  Administered 2016-12-28: 30 mL via ORAL

## 2016-12-28 MED FILL — CARAFATE 1 GM/10 ML SUSP: 1 | 11 days supply | Qty: 420 | Fill #0

## 2016-12-28 NOTE — Progress Notes (Signed)
HPI: Whitney Neal is a 56 y.o. female who  has a past medical history of Atypical chest pain, Barrett's esophagus, Cervical lymphadenopathy, Cervical strain, Constipation, GERD (gastroesophageal reflux disease), Hypercholesteremia, Hyperglycemia, Hyperplastic colonic polyp, Hypertension, Migraine, Osteopenia, Ulcer (2010), Urinary incontinence, and Vitamin D deficiency.  she presents to [facility], 12/28/16,  for chief complaint of:  Chief Complaint  Patient presents with  . Abdominal Pain    . Context: Hx GERD, feels like this but pretty severe. Hx ulcers years ago. Takes omeprazole  . Location: epigastric radiating into esophagus (pt points to middle of chest and up neck), also a bit sore around front of chest. No back pain . Quality: feels like getting hit in the stomach, burning pian up the esophagus . Duration: started about 05:00 today, now in office at 9:51 AM  . Timing: constant, worse with lying down  . Assoc signs/symptoms: no fever/chlils, no angina.    Past medical, surgical, social and family history reviewed:  Patient Active Problem List   Diagnosis Date Noted  . Elevated fasting glucose 05/23/2016  . Cardiac risk counseling 05/23/2016  . Migraine 10/01/2011  . Numbness and tingling in left hand 01/05/2011  . Hyperglycemia   . Essential hypertension 01/25/2010  . GERD 01/25/2010  . COLONIC POLYPS, HYPERPLASTIC, HX OF 01/25/2010  . CERVICAL STRAIN 11/23/2009  . Hyperlipidemia 09/26/2009  . OSTEOPENIA 07/11/2009  . GASTRIC ULCER, HX OF 07/11/2009  . BARRETT'S ESOPHAGUS, HX OF 07/11/2009  . Vitamin D deficiency 07/04/2009  . CHEST PAIN, ATYPICAL, HX OF 07/04/2009    Past Surgical History:  Procedure Laterality Date  . ABDOMINAL HYSTERECTOMY  2009   Dr. Bethann Goo - benign disease  . BLADDER SURGERY     bladder mesh surgery 2010- Dr. Rhodia Albright  . BREAST CYST ASPIRATION    . COLONOSCOPY W/ POLYPECTOMY  2010    Social  History   Tobacco Use  . Smoking status: Never Smoker  . Smokeless tobacco: Never Used  Substance Use Topics  . Alcohol use: No    Family History  Problem Relation Age of Onset  . Colon cancer Father   . Hypertension Father   . Heart attack Father 25  . Cancer Father        prostate CA  . Pulmonary embolism Mother        deceased  . Hypertension Mother   . Diabetes Sister   . Hypertension Brother   . Hypertension Sister   . Cancer Paternal Grandmother        oral  . Thyroid disease Sister        1/2 sister thyroid problem  . Healthy Daughter   . Heart attack Brother 5  . Breast cancer Cousin      Current medication list and allergy/intolerance information reviewed:    Current Outpatient Medications  Medication Sig Dispense Refill  . aspirin 81 MG EC tablet Take 81 mg by mouth daily.      Marland Kitchen atorvastatin (LIPITOR) 40 MG tablet Take 1 tablet (40 mg total) by mouth daily. 90 tablet 3  . Calcium Carbonate-Vitamin D (CALCIUM 600+D) 600-400 MG-UNIT per tablet Take 1 tablet by mouth 2 (two) times daily.      Marland Kitchen losartan (COZAAR) 25 MG tablet Take 1 tablet (25 mg total) daily by mouth. 90 tablet 3  . metFORMIN (GLUCOPHAGE XR) 500 MG 24 hr tablet Take 1 tablet (500 mg total) by mouth daily with breakfast. 90 tablet 3  .  omeprazole (PRILOSEC) 40 MG capsule Take 1 capsule (40 mg total) by mouth daily. 30 capsule 3  . sodium bicarbonate 325 MG tablet Take 1-2 tablets (325-650 mg total) by mouth 2 (two) times daily. Prn heartburn 180 tablet 11   No current facility-administered medications for this visit.     Allergies  Allergen Reactions  . Codeine     REACTION: headache, visual disturbance      Review of Systems:  Constitutional:  No  fever, no chills, No recent illness, No unintentional weight changes. No significant fatigue.   HEENT: No  headache, no vision change,  Cardiac: No  chest pain, No  pressure, No palpitations, No  Orthopnea  Respiratory:  No  shortness of  breath. No  Cough  Gastrointestinal: +abdominal pain, No  nausea, No  vomiting,  No  blood in stool, No  diarrhea, No  constipation   Musculoskeletal: No new myalgia/arthralgia  Skin: No  Rash,  Neurologic: No  weakness, No  dizziness   Exam:  BP (!) 146/91   Pulse 74   Temp 98.1 F (36.7 C) (Oral)   Constitutional: VS see above. General Appearance: alert, well-developed, well-nourished, NAD  Eyes: Normal lids and conjunctive, non-icteric sclera  Ears, Nose, Mouth, Throat: MMM, Normal external inspection ears/nares/mouth/lips/gums.   Neck: No masses, trachea midline.   Respiratory: Normal respiratory effort. no wheeze, no rhonchi, no rales  Cardiovascular: S1/S2 normal, no murmur, no rub/gallop auscultated. RRR. No lower extremity edema.   Gastrointestinal: (+)TTP epigastrum, no masses. No hepatomegaly, no splenomegaly. No hernia appreciated. Bowel sounds normal. Rectal exam deferred.   Musculoskeletal: Gait normal.  Neurological: Normal balance/coordination. No tremor.   Psychiatric: Normal judgment/insight. Normal mood and affect. Oriented x3.     EKG interpretation: Rate: 77 Rhythm: sinus No ST/T changes concerning for acute ischemia/infarct  Previous EKG 05/2013 tachycardic but sinus    ASSESSMENT/PLAN: The primary encounter diagnosis was Epigastric abdominal pain. Diagnoses of Gastroesophageal reflux disease with esophagitis, History of Barrett's esophagus, and GASTRIC ULCER, HX OF were also pertinent to this visit.  EKG ok, symptoms was better with GI cocktail in the office.   Symptoms more consistent with GERD or possible ulcer than anything else.   She is on PPI, unable to do breath test for H Pylori but I think she needs referral to GI anyway for likely EGD.    Patient Instructions  I think your symptoms are more likely due to gastritis, possible ulcer as opposed to anything dangerous like heart issue or GI bleed.   Plan:  Increase Omeprazole to  40 mg twice daily instead of once daily  Added Carafate syrup to take 3-4 times per day through the weekend  Labs today  [place], avoid fatty or spicy foods. Would recommend sleeping upright for the next few days.   Let's get you into GI, they will likely need to do a scope to take a look at the stomach  If worse, especially if you vomit anything that looks like coffee grounds, or experience vomiting blood, dark black stool, or worsening chest or back pain, please go to an emergency room right away     Orders Placed This Encounter  Procedures  . CBC with Differential/Platelet  . COMPLETE METABOLIC PANEL WITH GFR  . Lipase  . Ambulatory referral to Gastroenterology    Referral Priority:   Urgent    Referral Type:   Consultation    Referral Reason:   Specialty Services Required    Number of  Visits Requested:   1  . EKG 12-Lead     Visit summary with medication list and pertinent instructions was printed for patient to review. All questions at time of visit were answered - patient instructed to contact office with any additional concerns. ER/RTC precautions were reviewed with the patient. Follow-up plan: Return if symptoms worsen please go to ER, or see [place] or GI if fail to improve.  Note: Total time spent 25 minutes, greater than 50% of the visit was spent face-to-face counseling and coordinating care for the following: The primary encounter diagnosis was Epigastric abdominal pain. Diagnoses of Gastroesophageal reflux disease with esophagitis, History of Barrett's esophagus, and GASTRIC ULCER, HX OF were also pertinent to this visit.Marland Kitchen  Please note: voice recognition software was used to produce this document, and typos may escape review. Please contact Dr. Sheppard Coil for any needed clarifications.

## 2016-12-28 NOTE — Telephone Encounter (Signed)
Referral in epic for patient to be seen for ov. Patient was seen by Dr.Brodie in 2012 then had EGD with Dr.Hung in 2014. Patient wants to come back to our office due to her now working for [employer]. Patient not requesting certain doctor so referral and EGD placed on DOD for referral date today 12/28/16 Dr.Gessner's desk for review.

## 2016-12-28 NOTE — Patient Instructions (Addendum)
I think your symptoms are more likely due to gastritis, possible ulcer as opposed to anything dangerous like heart issue or GI bleed.   Plan:  Increase Omeprazole to 40 mg twice daily instead of once daily  Added Carafate syrup to take 3-4 times per day through the weekend  Labs today  [place], avoid fatty or spicy foods. Would recommend sleeping upright for the next few days.   Let's get you into GI, they will likely need to do a scope to take a look at the stomach  If worse, especially if you vomit anything that looks like coffee grounds, or experience vomiting blood, dark black stool, or worsening chest or back pain, please go to an emergency room right away

## 2016-12-29 LAB — COMPLETE METABOLIC PANEL WITH GFR
AG Ratio: 1.5 (calc) (ref 1.0–2.5)
ALT: 29 U/L (ref 6–29)
AST: 67 U/L — ABNORMAL HIGH (ref 10–35)
Albumin: 4.3 g/dL (ref 3.6–5.1)
Alkaline phosphatase (APISO): 82 U/L (ref 33–130)
BUN: 17 mg/dL (ref 7–25)
CO2: 24 mmol/L (ref 20–32)
Calcium: 9.5 mg/dL (ref 8.6–10.4)
Chloride: 106 mmol/L (ref 98–110)
Creat: 0.65 mg/dL (ref 0.50–1.05)
GFR, Est African American: 113 mL/min/{1.73_m2} (ref >=60)
GFR, Est Non African American: 98 mL/min/{1.73_m2} (ref >=60)
Globulin: 2.8 g/dL (calc) (ref 1.9–3.7)
Glucose, Bld: 92 mg/dL (ref 65–99)
Potassium: 4.3 mmol/L (ref 3.5–5.3)
Sodium: 141 mmol/L (ref 135–146)
Total Bilirubin: 0.4 mg/dL (ref 0.2–1.2)
Total Protein: 7.1 g/dL (ref 6.1–8.1)

## 2016-12-29 LAB — LIPASE: Lipase: 30 U/L (ref 7–60)

## 2016-12-29 LAB — CBC WITH DIFFERENTIAL/PLATELET
Basophils Absolute: 29 cells/uL (ref 0–200)
Basophils Relative: 0.5 %
Eosinophils Absolute: 29 cells/uL (ref 15–500)
Eosinophils Relative: 0.5 %
HCT: 38.6 % (ref 35.0–45.0)
Hemoglobin: 13.3 g/dL (ref 11.7–15.5)
Lymphs Abs: 2155 cells/uL (ref 850–3900)
MCH: 29 pg (ref 27.0–33.0)
MCHC: 34.5 g/dL (ref 32.0–36.0)
MCV: 84.1 fL (ref 80.0–100.0)
MPV: 11.2 fL (ref 7.5–12.5)
Monocytes Relative: 6.4 %
Neutro Abs: 3124 cells/uL (ref 1500–7800)
Neutrophils Relative %: 54.8 %
Platelets: 367 10*3/uL (ref 140–400)
RBC: 4.59 10*6/uL (ref 3.80–5.10)
RDW: 12.9 % (ref 11.0–15.0)
Total Lymphocyte: 37.8 %
WBC mixed population: 365 cells/uL (ref 200–950)
WBC: 5.7 10*3/uL (ref 3.8–10.8)

## 2017-01-04 NOTE — Telephone Encounter (Signed)
Spoke to Ms Lansdale. She speaks english and does not need an interpreter. Although Dr Carlean Purl approved the switch, she chooses to keep her care with Dr Benson [place]. Referral has been forwarded there.

## 2017-01-09 ENCOUNTER — Other Ambulatory Visit: Payer: Self-pay | Admitting: Gastroenterology

## 2017-01-09 DIAGNOSIS — K219 Gastro-esophageal reflux disease without esophagitis: Secondary | ICD-10-CM | POA: Diagnosis not present

## 2017-01-09 DIAGNOSIS — Z1211 Encounter for screening for malignant neoplasm of colon: Secondary | ICD-10-CM | POA: Diagnosis not present

## 2017-01-09 DIAGNOSIS — R1013 Epigastric pain: Secondary | ICD-10-CM

## 2017-01-09 MED FILL — PLENVU 140 GM SOLR: 140 | 1 days supply | Qty: 3 | Fill #0

## 2017-01-14 MED FILL — OMEPRAZOLE DR 40 MG CAPSULE: 40 | 30 days supply | Qty: 30 | Fill #1

## 2017-01-22 ENCOUNTER — Ambulatory Visit (HOSPITAL_COMMUNITY): Payer: 59

## 2017-01-28 ENCOUNTER — Ambulatory Visit (HOSPITAL_COMMUNITY): Payer: 59

## 2017-01-29 ENCOUNTER — Ambulatory Visit (HOSPITAL_COMMUNITY): Payer: 59

## 2017-01-31 DIAGNOSIS — D124 Benign neoplasm of descending colon: Secondary | ICD-10-CM | POA: Diagnosis not present

## 2017-01-31 DIAGNOSIS — K5730 Diverticulosis of large intestine without perforation or abscess without bleeding: Secondary | ICD-10-CM | POA: Diagnosis not present

## 2017-01-31 DIAGNOSIS — K635 Polyp of colon: Secondary | ICD-10-CM | POA: Diagnosis not present

## 2017-01-31 DIAGNOSIS — Z1211 Encounter for screening for malignant neoplasm of colon: Secondary | ICD-10-CM | POA: Diagnosis not present

## 2017-01-31 DIAGNOSIS — D120 Benign neoplasm of cecum: Secondary | ICD-10-CM | POA: Diagnosis not present

## 2017-02-01 ENCOUNTER — Ambulatory Visit (HOSPITAL_COMMUNITY)
Admission: RE | Admit: 2017-02-01 | Discharge: 2017-02-01 | Disposition: A | Discharge status: Home / Self Care | Payer: 59 | Source: Ambulatory Visit | Attending: Gastroenterology | Admitting: Gastroenterology

## 2017-02-01 DIAGNOSIS — K8020 Calculus of gallbladder without cholecystitis without obstruction: Secondary | ICD-10-CM | POA: Diagnosis not present

## 2017-02-01 DIAGNOSIS — K769 Liver disease, unspecified: Secondary | ICD-10-CM | POA: Diagnosis not present

## 2017-02-01 DIAGNOSIS — R1013 Epigastric pain: Secondary | ICD-10-CM | POA: Diagnosis present

## 2017-02-04 ENCOUNTER — Telehealth: Payer: Self-pay | Admitting: Osteopathic Medicine

## 2017-02-04 DIAGNOSIS — D1803 Hemangioma of intra-abdominal structures: Secondary | ICD-10-CM | POA: Insufficient documentation

## 2017-02-04 NOTE — Telephone Encounter (Signed)
Please call patient:  I got ultrasound results back - some stones in the gallbladder but nothing seems to be blocking the bile duct, and no infection.   Also on ultrasound there was a small spot in the liver likely a hemangioma, which is a common benign tumor made of a cluster of blood vessels and is not cancerous. This is usually followed up with repeat ultrasound in 6 months to ensure it's stable.   If she is still having abdominal pain, I can refer her to GI if she'd like?

## 2017-02-05 NOTE — Telephone Encounter (Signed)
Called pt. And results given. Pt states not having the abdominal pain anymore. KG LPN

## 2017-02-13 MED FILL — OMEPRAZOLE DR 40 MG CAPSULE: 40 | 30 days supply | Qty: 30 | Fill #2

## 2017-03-04 MED FILL — METFORMIN HCL ER 500 MG TAB: 500 | 90 days supply | Qty: 90 | Fill #2

## 2017-03-08 ENCOUNTER — Emergency Department (HOSPITAL_BASED_OUTPATIENT_CLINIC_OR_DEPARTMENT_OTHER): Payer: 59

## 2017-03-08 ENCOUNTER — Inpatient Hospital Stay (HOSPITAL_BASED_OUTPATIENT_CLINIC_OR_DEPARTMENT_OTHER)
Admission: EM | Admit: 2017-03-08 | Discharge: 2017-03-15 | DRG: 418 | Disposition: A | Discharge status: Home / Self Care | Payer: 59 | Attending: Surgery | Admitting: Surgery

## 2017-03-08 ENCOUNTER — Encounter (HOSPITAL_BASED_OUTPATIENT_CLINIC_OR_DEPARTMENT_OTHER): Payer: Self-pay

## 2017-03-08 ENCOUNTER — Other Ambulatory Visit: Payer: Self-pay

## 2017-03-08 DIAGNOSIS — K567 Ileus, unspecified: Secondary | ICD-10-CM | POA: Diagnosis not present

## 2017-03-08 DIAGNOSIS — E785 Hyperlipidemia, unspecified: Secondary | ICD-10-CM | POA: Diagnosis present

## 2017-03-08 DIAGNOSIS — I10 Essential (primary) hypertension: Secondary | ICD-10-CM | POA: Diagnosis present

## 2017-03-08 DIAGNOSIS — Z8249 Family history of ischemic heart disease and other diseases of the circulatory system: Secondary | ICD-10-CM

## 2017-03-08 DIAGNOSIS — Z7984 Long term (current) use of oral hypoglycemic drugs: Secondary | ICD-10-CM | POA: Diagnosis not present

## 2017-03-08 DIAGNOSIS — K7581 Nonalcoholic steatohepatitis (NASH): Secondary | ICD-10-CM | POA: Diagnosis not present

## 2017-03-08 DIAGNOSIS — E7800 Pure hypercholesterolemia, unspecified: Secondary | ICD-10-CM | POA: Diagnosis present

## 2017-03-08 DIAGNOSIS — K2270 Barrett's esophagus without dysplasia: Secondary | ICD-10-CM | POA: Diagnosis present

## 2017-03-08 DIAGNOSIS — Z9049 Acquired absence of other specified parts of digestive tract: Secondary | ICD-10-CM | POA: Diagnosis not present

## 2017-03-08 DIAGNOSIS — K819 Cholecystitis, unspecified: Secondary | ICD-10-CM

## 2017-03-08 DIAGNOSIS — K832 Perforation of bile duct: Secondary | ICD-10-CM | POA: Diagnosis not present

## 2017-03-08 DIAGNOSIS — R739 Hyperglycemia, unspecified: Secondary | ICD-10-CM | POA: Diagnosis present

## 2017-03-08 DIAGNOSIS — K8010 Calculus of gallbladder with chronic cholecystitis without obstruction: Secondary | ICD-10-CM | POA: Diagnosis not present

## 2017-03-08 DIAGNOSIS — S3613XA Injury of bile duct, initial encounter: Secondary | ICD-10-CM

## 2017-03-08 DIAGNOSIS — R000 Tachycardia, unspecified: Secondary | ICD-10-CM | POA: Diagnosis not present

## 2017-03-08 DIAGNOSIS — R1011 Right upper quadrant pain: Secondary | ICD-10-CM | POA: Diagnosis not present

## 2017-03-08 DIAGNOSIS — R0789 Other chest pain: Secondary | ICD-10-CM | POA: Diagnosis present

## 2017-03-08 DIAGNOSIS — R0602 Shortness of breath: Secondary | ICD-10-CM | POA: Diagnosis not present

## 2017-03-08 DIAGNOSIS — K219 Gastro-esophageal reflux disease without esophagitis: Secondary | ICD-10-CM | POA: Diagnosis present

## 2017-03-08 DIAGNOSIS — K9189 Other postprocedural complications and disorders of digestive system: Secondary | ICD-10-CM | POA: Diagnosis not present

## 2017-03-08 DIAGNOSIS — K8000 Calculus of gallbladder with acute cholecystitis without obstruction: Secondary | ICD-10-CM

## 2017-03-08 DIAGNOSIS — E559 Vitamin D deficiency, unspecified: Secondary | ICD-10-CM | POA: Diagnosis not present

## 2017-03-08 DIAGNOSIS — K838 Other specified diseases of biliary tract: Secondary | ICD-10-CM | POA: Diagnosis not present

## 2017-03-08 DIAGNOSIS — K9181 Other intraoperative complications of digestive system: Secondary | ICD-10-CM | POA: Diagnosis not present

## 2017-03-08 DIAGNOSIS — Q441 Other congenital malformations of gallbladder: Secondary | ICD-10-CM | POA: Diagnosis not present

## 2017-03-08 DIAGNOSIS — Z90710 Acquired absence of both cervix and uterus: Secondary | ICD-10-CM | POA: Diagnosis not present

## 2017-03-08 DIAGNOSIS — K8020 Calculus of gallbladder without cholecystitis without obstruction: Secondary | ICD-10-CM | POA: Diagnosis not present

## 2017-03-08 DIAGNOSIS — K828 Other specified diseases of gallbladder: Secondary | ICD-10-CM | POA: Diagnosis not present

## 2017-03-08 DIAGNOSIS — K8012 Calculus of gallbladder with acute and chronic cholecystitis without obstruction: Secondary | ICD-10-CM | POA: Diagnosis not present

## 2017-03-08 DIAGNOSIS — Z7982 Long term (current) use of aspirin: Secondary | ICD-10-CM | POA: Diagnosis not present

## 2017-03-08 DIAGNOSIS — Y838 Other surgical procedures as the cause of abnormal reaction of the patient, or of later complication, without mention of misadventure at the time of the procedure: Secondary | ICD-10-CM | POA: Diagnosis not present

## 2017-03-08 DIAGNOSIS — Z885 Allergy status to narcotic agent status: Secondary | ICD-10-CM | POA: Diagnosis not present

## 2017-03-08 DIAGNOSIS — R079 Chest pain, unspecified: Secondary | ICD-10-CM

## 2017-03-08 DIAGNOSIS — K812 Acute cholecystitis with chronic cholecystitis: Secondary | ICD-10-CM | POA: Diagnosis present

## 2017-03-08 DIAGNOSIS — Z79899 Other long term (current) drug therapy: Secondary | ICD-10-CM | POA: Diagnosis not present

## 2017-03-08 DIAGNOSIS — K839 Disease of biliary tract, unspecified: Secondary | ICD-10-CM

## 2017-03-08 DIAGNOSIS — R932 Abnormal findings on diagnostic imaging of liver and biliary tract: Secondary | ICD-10-CM | POA: Diagnosis not present

## 2017-03-08 DIAGNOSIS — K5904 Chronic idiopathic constipation: Secondary | ICD-10-CM

## 2017-03-08 HISTORY — DX: Other specified postprocedural states: Z98.890

## 2017-03-08 HISTORY — DX: Nausea with vomiting, unspecified: R11.2

## 2017-03-08 LAB — CBC
HCT: 39.1 % (ref 36.0–46.0)
Hemoglobin: 13.3 g/dL (ref 12.0–15.0)
MCH: 28.9 pg (ref 26.0–34.0)
MCHC: 34 g/dL (ref 30.0–36.0)
MCV: 84.8 fL (ref 78.0–100.0)
Platelets: 317 10*3/uL (ref 150–400)
RBC: 4.61 MIL/uL (ref 3.87–5.11)
RDW: 13.3 % (ref 11.5–15.5)
WBC: 8 10*3/uL (ref 4.0–10.5)

## 2017-03-08 LAB — COMPREHENSIVE METABOLIC PANEL
ALT: 40 U/L (ref 14–54)
AST: 84 U/L — ABNORMAL HIGH (ref 15–41)
Albumin: 3.9 g/dL (ref 3.5–5.0)
Alkaline Phosphatase: 130 U/L — ABNORMAL HIGH (ref 38–126)
Anion gap: 12 (ref 5–15)
BUN: 16 mg/dL (ref 6–20)
CO2: 23 mmol/L (ref 22–32)
Calcium: 9 mg/dL (ref 8.9–10.3)
Chloride: 103 mmol/L (ref 101–111)
Creatinine, Ser: 0.66 mg/dL (ref 0.44–1.00)
GFR calc Af Amer: >60 mL/min (ref >60)
GFR calc non Af Amer: >60 mL/min (ref >60)
Glucose, Bld: 173 mg/dL — ABNORMAL HIGH (ref 65–99)
Potassium: 3.7 mmol/L (ref 3.5–5.1)
Sodium: 138 mmol/L (ref 135–145)
Total Bilirubin: 0.5 mg/dL (ref 0.3–1.2)
Total Protein: 7.3 g/dL (ref 6.5–8.1)

## 2017-03-08 LAB — TROPONIN I
Troponin I: <0.03 ng/mL (ref <0.03)
Troponin I: <0.03 ng/mL (ref <0.03)

## 2017-03-08 LAB — LIPASE, BLOOD: Lipase: 31 U/L (ref 11–51)

## 2017-03-08 LAB — D-DIMER, QUANTITATIVE (NOT AT ARMC): D-Dimer, Quant: <0.27 ug/mL-FEU (ref 0.00–0.50)

## 2017-03-08 MED ORDER — GI COCKTAIL ~~LOC~~
30.0000 mL | Freq: Once | ORAL | Status: AC
  Administered 2017-03-08: 30 mL via ORAL
  Filled 2017-03-08: qty 30

## 2017-03-08 MED ORDER — FENTANYL CITRATE (PF) 100 MCG/2ML IJ SOLN: 12.5000 ug | Freq: Once | INTRAMUSCULAR | Status: DC

## 2017-03-08 MED ORDER — ONDANSETRON HCL 4 MG/2ML IJ SOLN
4.0000 mg | Freq: Once | INTRAMUSCULAR | Status: AC
  Administered 2017-03-08: 4 mg via INTRAVENOUS
  Filled 2017-03-08: qty 2

## 2017-03-08 MED ORDER — HYDROMORPHONE HCL 1 MG/ML IJ SOLN
1.0000 mg | Freq: Once | INTRAMUSCULAR | Status: AC
  Administered 2017-03-08: 1 mg via INTRAVENOUS
  Filled 2017-03-08: qty 1

## 2017-03-08 MED ORDER — FENTANYL CITRATE (PF) 100 MCG/2ML IJ SOLN
50.0000 ug | Freq: Once | INTRAMUSCULAR | Status: AC
  Administered 2017-03-08: 50 ug via INTRAVENOUS
  Filled 2017-03-08: qty 2

## 2017-03-08 MED ORDER — ONDANSETRON HCL 8 MG PO TABS: 4.0000 mg | ORAL_TABLET | Freq: Once | ORAL | Status: DC

## 2017-03-08 NOTE — ED Triage Notes (Signed)
C/o right side CP x 3 weeks-states she was seen by GI had [place] and dx with gallstones-pt grimacing pain-presents to triage in w/c

## 2017-03-08 NOTE — ED Provider Notes (Signed)
[facility] Provider Note   CSN: [id number] Arrival date & time: 03/08/17  1916     History   Chief Complaint Chief Complaint  Patient presents with  . Chest Pain    HPI Whitney Neal is a 57 y.o. female with PMH HTN, barrett's esophagus, presenting to ED with pain that radiates from her RUQ across her entire chest. It also goes to her back. She feels pain is sharp. It came on after dinner of steak and potatoes. Prilosec did not relieve symptoms prompting her to come to ED. She denies dyspnea, diaphoresis. She has had previous symptoms in a prior episode last week which lasted about 30 minutes and improved when she drank milk.   Patient reports that she had a previous workup by gastroenterology with colonoscopy, endoscopy which were negative and GB ultrasound which showed cholelithiasis but was otherwise negative, and these were completed in December. Biopsies to rule out barrett's esophagus are still pending.  Past Medical History:  Diagnosis Date  . Atypical chest pain   . Barrett's esophagus    history of   . Cervical lymphadenopathy    left  . Cervical strain   . Constipation   . GERD (gastroesophageal reflux disease)   . Hypercholesteremia   . Hyperglycemia    a1c 6.3 7/12  . Hyperplastic colonic polyp    history of   . Hypertension   . Migraine   . Osteopenia   . Ulcer 2010  . Urinary incontinence   . Vitamin D deficiency     Patient Active Problem List   Diagnosis Date Noted  . Hemangioma of liver 02/04/2017  . Elevated fasting glucose 05/23/2016  . Cardiac risk counseling 05/23/2016  . Migraine 10/01/2011  . Numbness and tingling in left hand 01/05/2011  . Hyperglycemia   . Essential hypertension 01/25/2010  . GERD 01/25/2010  . COLONIC POLYPS, HYPERPLASTIC, HX OF 01/25/2010  . CERVICAL STRAIN 11/23/2009  . Hyperlipidemia 09/26/2009  . OSTEOPENIA 07/11/2009  . GASTRIC ULCER, HX OF 07/11/2009  . History of Barrett's  esophagus 07/11/2009  . Vitamin D deficiency 07/04/2009  . CHEST PAIN, ATYPICAL, HX OF 07/04/2009    Past Surgical History:  Procedure Laterality Date  . ABDOMINAL HYSTERECTOMY  2009   Dr. Bethann Goo - benign disease  . BLADDER SURGERY     bladder mesh surgery 2010- Dr. Rhodia Albright  . BREAST CYST ASPIRATION    . COLONOSCOPY W/ POLYPECTOMY  2010    OB History    No data available      Home Medications    Prior to Admission medications   Medication Sig Start Date End Date Taking? Authorizing Provider  aspirin 81 MG EC tablet Take 81 mg by mouth daily.      [provider]  atorvastatin (LIPITOR) 40 MG tablet Take 1 tablet (40 mg total) by mouth daily. 02/08/16   Emeterio Reeve, DO  Calcium Carbonate-Vitamin D (CALCIUM 600+D) 600-400 MG-UNIT per tablet Take 1 tablet by mouth 2 (two) times daily.      [provider]  losartan (COZAAR) 25 MG tablet Take 1 tablet (25 mg total) daily by mouth. 12/27/16   Emeterio Reeve, DO  metFORMIN (GLUCOPHAGE XR) 500 MG 24 hr tablet Take 1 tablet (500 mg total) by mouth daily with breakfast. 08/23/16 08/23/17  Emeterio Reeve, DO  omeprazole (PRILOSEC) 40 MG capsule Take 1 capsule (40 mg total) by mouth daily. 08/27/16   Emeterio Reeve, DO  sodium bicarbonate 325 MG  tablet Take 1-2 tablets (325-650 mg total) by mouth 2 (two) times daily. Prn heartburn 08/27/16   Emeterio Reeve, DO  sucralfate (CARAFATE) 1 GM/10ML suspension Take 10 mLs (1 g total) 4 (four) times daily -  with meals and at bedtime by mouth. 12/28/16   Emeterio Reeve, DO    Family History Family History  Problem Relation Age of Onset  . Colon cancer Father   . Hypertension Father   . Heart attack Father 28  . Cancer Father        prostate CA  . Pulmonary embolism Mother        deceased  . Hypertension Mother   . Diabetes Sister   . Hypertension Brother   . Hypertension Sister   . Cancer Paternal Grandmother        oral  . Thyroid disease Sister         1/2 sister thyroid problem  . Healthy Daughter   . Heart attack Brother 71  . Breast cancer Cousin     Social History Social History   Tobacco Use  . Smoking status: Never Smoker  . Smokeless tobacco: Never Used  Substance Use Topics  . Alcohol use: No  . Drug use: No     Allergies   Codeine and Morphine and related   Review of Systems Review of Systems See HPI for ROS  Physical Exam Updated Vital Signs BP (!) 146/90 (BP Location: Right Arm)   Pulse (!) 104   Temp 97.6 F (36.4 C) (Oral)   Resp (!) 22   Ht 5' (1.524 m)   Wt 68 kg (150 lb)   SpO2 98%   BMI 29.29 kg/m   Physical Exam  Constitutional: She appears well-developed and well-nourished.  HENT:  Head: Normocephalic and atraumatic.  Neck: Neck supple.  Cardiovascular: Regular rhythm. Tachycardia present. Exam reveals no gallop and no friction rub.  No murmur heard. Pulmonary/Chest: Effort normal and breath sounds normal. No accessory muscle usage.  Abdominal:  Soft, +TTP in epigastric region, negative murphy sign. No Rebounding or guarding. No hepatosplenomegaly.  Musculoskeletal: Normal range of motion.  Neurological: She is alert.  Skin: Capillary refill takes less than 2 seconds.     ED Treatments / Results  Labs (all labs ordered are listed, but only abnormal results are displayed) Labs Reviewed  COMPREHENSIVE METABOLIC PANEL - Abnormal; Notable for the following components:      Result Value   Glucose, Bld 173 (*)    AST 84 (*)    Alkaline Phosphatase 130 (*)    All other components within normal limits  TROPONIN I  CBC  LIPASE, BLOOD  D-DIMER, QUANTITATIVE (NOT AT [facility])  TROPONIN I   EKG  EKG Interpretation  Date/Time:  Friday March 08 2017 19:20:13 EST Ventricular Rate:  99 PR Interval:  152 QRS Duration: 92 QT Interval:  360 QTC Calculation: 462 R Axis:   66 Text Interpretation:  Normal sinus rhythm Cannot rule out Anterior infarct , age undetermined No significant  change since last tracing Confirmed by Blanchie Dessert [phone]) on 03/08/2017 7:27:35 PM       Radiology Dg Chest 2 View  Result Date: 03/08/2017 CLINICAL DATA:  Acute right chest pain today. EXAM: CHEST  2 VIEW COMPARISON:  None. FINDINGS: The cardiomediastinal silhouette is unremarkable. There is no evidence of focal airspace disease, pulmonary edema, suspicious pulmonary nodule/mass, pleural effusion, or pneumothorax. No acute bony abnormalities are identified. IMPRESSION: No active cardiopulmonary disease. Electronically Signed  By: Margarette Canada M.D.   On: 03/08/2017 20:18   US Abdomen Limited Ruq  Result Date: 03/08/2017 CLINICAL DATA:  Right upper quadrant pain EXAM: ULTRASOUND ABDOMEN LIMITED RIGHT UPPER QUADRANT COMPARISON:  02/01/2017 FINDINGS: Gallbladder: Multiple mobile gallstones. Positive sonographic Murphy. Normal wall thickness Common bile duct: Diameter: 4 mm Liver: Small echogenic mass measuring 8 mm in the right lobe. Portal vein is patent on color Doppler imaging with normal direction of blood flow towards the liver. IMPRESSION: 1. Cholelithiasis with positive sonographic Murphy; gallbladder wall thickness is normal however, concern is for an acute cholecystitis. 2. Negative for biliary dilatation 3. Stable 8 mm echogenic mass in the right liver. Electronically Signed   By: Donavan Foil M.D.   On: 03/08/2017 22:56    Procedures Procedures (including critical care time)  Medications Ordered in ED Medications  gi cocktail (Maalox,Lidocaine,Donnatal) (30 mLs Oral Given 03/08/17 2008)  fentaNYL (SUBLIMAZE) injection 50 mcg (50 mcg Intravenous Given 03/08/17 2008)  ondansetron (ZOFRAN) injection 4 mg (4 mg Intravenous Given 03/08/17 2008)  gi cocktail (Maalox,Lidocaine,Donnatal) (30 mLs Oral Given 03/08/17 2218)  fentaNYL (SUBLIMAZE) injection 50 mcg (50 mcg Intravenous Given 03/08/17 2247)  HYDROmorphone (DILAUDID) injection 1 mg (1 mg Intravenous Given 03/08/17 2315)     Initial Impression / Assessment and Plan / ED Course  I have reviewed the triage vital signs and the nursing notes.  Pertinent labs & imaging results that were available during my care of the patient were reviewed by me and considered in my medical decision making (see chart for details).    57 year old female with history of reflux presents for abdominal pain radiating to chest and back. Symptoms are most consistent with GI etiology. CBC and CMP unremarkable other than mildly elevated AST at 84 and mildly elevated ALK phos at 130. Considered atypical chest pain/cardiac etiology. EKG notable for sinus tachycardia. Troponin negative x2. Considered PE as etiology of sudden onset chest pain, however reassured by negative D dimer.  Repeat RUQ U/S in the ED showed cholelithiasis with positive sonographic murphy. No biliary dilatation. Patient continued to have 10/10 pain despite multiple doses of fentanyl. General surgery consulted for cholecystitis. Spoke with Dr. Johney Maine and recommendation was to do ED to ED transfer so patient may be evaluated. Will speak to ED provider at [facility].  Final Clinical Impressions(s) / ED Diagnoses   Final diagnoses:  RUQ abdominal pain    ED Discharge Orders    None       Everrett Coombe, MD 03/08/17 2325    Everrett Coombe, MD 03/08/17 2951    Blanchie Dessert, MD 03/09/17 1123

## 2017-03-08 NOTE — ED Notes (Signed)
Patient transported to Ultrasound 

## 2017-03-09 ENCOUNTER — Inpatient Hospital Stay (HOSPITAL_COMMUNITY): Payer: 59

## 2017-03-09 ENCOUNTER — Inpatient Hospital Stay (HOSPITAL_COMMUNITY): Payer: 59 | Admitting: Certified Registered"

## 2017-03-09 ENCOUNTER — Encounter (HOSPITAL_COMMUNITY): Admission: EM | Disposition: A | Discharge status: Home / Self Care | Payer: Self-pay | Source: Home / Self Care

## 2017-03-09 ENCOUNTER — Encounter (HOSPITAL_COMMUNITY): Payer: Self-pay | Admitting: Surgery

## 2017-03-09 DIAGNOSIS — K5900 Constipation, unspecified: Secondary | ICD-10-CM | POA: Insufficient documentation

## 2017-03-09 DIAGNOSIS — K9189 Other postprocedural complications and disorders of digestive system: Secondary | ICD-10-CM | POA: Diagnosis not present

## 2017-03-09 DIAGNOSIS — S3613XA Injury of bile duct, initial encounter: Secondary | ICD-10-CM

## 2017-03-09 DIAGNOSIS — K219 Gastro-esophageal reflux disease without esophagitis: Secondary | ICD-10-CM | POA: Diagnosis not present

## 2017-03-09 DIAGNOSIS — E785 Hyperlipidemia, unspecified: Secondary | ICD-10-CM | POA: Diagnosis not present

## 2017-03-09 DIAGNOSIS — K839 Disease of biliary tract, unspecified: Secondary | ICD-10-CM | POA: Diagnosis not present

## 2017-03-09 DIAGNOSIS — K8010 Calculus of gallbladder with chronic cholecystitis without obstruction: Secondary | ICD-10-CM | POA: Diagnosis not present

## 2017-03-09 DIAGNOSIS — R932 Abnormal findings on diagnostic imaging of liver and biliary tract: Secondary | ICD-10-CM | POA: Diagnosis not present

## 2017-03-09 DIAGNOSIS — Z9049 Acquired absence of other specified parts of digestive tract: Secondary | ICD-10-CM | POA: Diagnosis not present

## 2017-03-09 DIAGNOSIS — Z7982 Long term (current) use of aspirin: Secondary | ICD-10-CM | POA: Diagnosis not present

## 2017-03-09 DIAGNOSIS — Z90710 Acquired absence of both cervix and uterus: Secondary | ICD-10-CM | POA: Diagnosis not present

## 2017-03-09 DIAGNOSIS — E7800 Pure hypercholesterolemia, unspecified: Secondary | ICD-10-CM | POA: Diagnosis present

## 2017-03-09 DIAGNOSIS — K567 Ileus, unspecified: Secondary | ICD-10-CM | POA: Diagnosis not present

## 2017-03-09 DIAGNOSIS — K7581 Nonalcoholic steatohepatitis (NASH): Secondary | ICD-10-CM | POA: Diagnosis present

## 2017-03-09 DIAGNOSIS — Z8249 Family history of ischemic heart disease and other diseases of the circulatory system: Secondary | ICD-10-CM | POA: Diagnosis not present

## 2017-03-09 DIAGNOSIS — Q441 Other congenital malformations of gallbladder: Secondary | ICD-10-CM | POA: Diagnosis not present

## 2017-03-09 DIAGNOSIS — R000 Tachycardia, unspecified: Secondary | ICD-10-CM | POA: Diagnosis present

## 2017-03-09 DIAGNOSIS — R079 Chest pain, unspecified: Secondary | ICD-10-CM | POA: Diagnosis not present

## 2017-03-09 DIAGNOSIS — R739 Hyperglycemia, unspecified: Secondary | ICD-10-CM | POA: Diagnosis present

## 2017-03-09 DIAGNOSIS — R0602 Shortness of breath: Secondary | ICD-10-CM | POA: Diagnosis not present

## 2017-03-09 DIAGNOSIS — K832 Perforation of bile duct: Secondary | ICD-10-CM | POA: Diagnosis not present

## 2017-03-09 DIAGNOSIS — E559 Vitamin D deficiency, unspecified: Secondary | ICD-10-CM | POA: Diagnosis not present

## 2017-03-09 DIAGNOSIS — Z79899 Other long term (current) drug therapy: Secondary | ICD-10-CM | POA: Diagnosis not present

## 2017-03-09 DIAGNOSIS — Y838 Other surgical procedures as the cause of abnormal reaction of the patient, or of later complication, without mention of misadventure at the time of the procedure: Secondary | ICD-10-CM | POA: Diagnosis not present

## 2017-03-09 DIAGNOSIS — I10 Essential (primary) hypertension: Secondary | ICD-10-CM | POA: Diagnosis not present

## 2017-03-09 DIAGNOSIS — R1011 Right upper quadrant pain: Secondary | ICD-10-CM | POA: Diagnosis not present

## 2017-03-09 DIAGNOSIS — K2270 Barrett's esophagus without dysplasia: Secondary | ICD-10-CM | POA: Diagnosis present

## 2017-03-09 DIAGNOSIS — K828 Other specified diseases of gallbladder: Secondary | ICD-10-CM | POA: Diagnosis not present

## 2017-03-09 DIAGNOSIS — K812 Acute cholecystitis with chronic cholecystitis: Secondary | ICD-10-CM

## 2017-03-09 DIAGNOSIS — Z885 Allergy status to narcotic agent status: Secondary | ICD-10-CM | POA: Diagnosis not present

## 2017-03-09 DIAGNOSIS — K8012 Calculus of gallbladder with acute and chronic cholecystitis without obstruction: Secondary | ICD-10-CM | POA: Diagnosis not present

## 2017-03-09 DIAGNOSIS — K838 Other specified diseases of biliary tract: Secondary | ICD-10-CM | POA: Diagnosis not present

## 2017-03-09 DIAGNOSIS — K9181 Other intraoperative complications of digestive system: Secondary | ICD-10-CM | POA: Diagnosis not present

## 2017-03-09 DIAGNOSIS — K8000 Calculus of gallbladder with acute cholecystitis without obstruction: Secondary | ICD-10-CM | POA: Diagnosis not present

## 2017-03-09 DIAGNOSIS — Z7984 Long term (current) use of oral hypoglycemic drugs: Secondary | ICD-10-CM | POA: Diagnosis not present

## 2017-03-09 DIAGNOSIS — R0789 Other chest pain: Secondary | ICD-10-CM | POA: Diagnosis present

## 2017-03-09 HISTORY — DX: Injury of bile duct, initial encounter: S36.13XA

## 2017-03-09 HISTORY — PX: LAPAROSCOPIC CHOLECYSTECTOMY SINGLE PORT: SHX5891

## 2017-03-09 HISTORY — DX: Acute cholecystitis with chronic cholecystitis: K81.2

## 2017-03-09 LAB — MRSA CULTURE

## 2017-03-09 LAB — HEMOGLOBIN A1C
Hgb A1c MFr Bld: 5.7 % — ABNORMAL HIGH (ref 4.8–5.6)
Mean Plasma Glucose: 116.89 mg/dL

## 2017-03-09 LAB — GLUCOSE, CAPILLARY
Glucose-Capillary: 127 mg/dL — ABNORMAL HIGH (ref 65–99)
Glucose-Capillary: 159 mg/dL — ABNORMAL HIGH (ref 65–99)
Glucose-Capillary: 157 mg/dL — ABNORMAL HIGH (ref 65–99)
Glucose-Capillary: 125 mg/dL — ABNORMAL HIGH (ref 65–99)

## 2017-03-09 LAB — SURGICAL PCR SCREEN
MRSA, PCR: INVALID — AB
Staphylococcus aureus: INVALID — AB

## 2017-03-09 LAB — HIV ANTIBODY (ROUTINE TESTING W REFLEX): HIV Screen 4th Generation wRfx: NONREACTIVE

## 2017-03-09 SURGERY — LAPAROSCOPIC CHOLECYSTECTOMY SINGLE SITE
Anesthesia: General | Site: Abdomen

## 2017-03-09 MED ORDER — CHLORHEXIDINE GLUCONATE CLOTH 2 % EX PADS
6.0000 | MEDICATED_PAD | Freq: Once | CUTANEOUS | Status: AC
  Administered 2017-03-09: 6 via TOPICAL

## 2017-03-09 MED ORDER — ONDANSETRON HCL 4 MG/2ML IJ SOLN
INTRAMUSCULAR | Status: AC
  Filled 2017-03-09: qty 2

## 2017-03-09 MED ORDER — GABAPENTIN 300 MG PO CAPS
300.0000 mg | ORAL_CAPSULE | Freq: Every day | ORAL | Status: DC
  Administered 2017-03-09: 300 mg via ORAL
  Filled 2017-03-09: qty 1

## 2017-03-09 MED ORDER — IOPAMIDOL (ISOVUE-300) INJECTION 61%
INTRAVENOUS | Status: AC
  Filled 2017-03-09: qty 50

## 2017-03-09 MED ORDER — CELECOXIB 200 MG PO CAPS
200.0000 mg | ORAL_CAPSULE | ORAL | Status: AC
  Administered 2017-03-09: 200 mg via ORAL
  Filled 2017-03-09 (×2): qty 1

## 2017-03-09 MED ORDER — ROCURONIUM BROMIDE 50 MG/5ML IV SOSY
PREFILLED_SYRINGE | INTRAVENOUS | Status: AC
  Filled 2017-03-09: qty 10

## 2017-03-09 MED ORDER — ALUM & MAG HYDROXIDE-SIMETH 200-200-20 MG/5ML PO SUSP
30.0000 mL | Freq: Four times a day (QID) | ORAL | Status: DC | PRN
  Administered 2017-03-11 – 2017-03-13 (×3): 30 mL via ORAL
  Filled 2017-03-09 (×4): qty 30

## 2017-03-09 MED ORDER — ROCURONIUM BROMIDE 10 MG/ML (PF) SYRINGE
PREFILLED_SYRINGE | INTRAVENOUS | Status: DC | PRN
  Administered 2017-03-09: 30 mg via INTRAVENOUS

## 2017-03-09 MED ORDER — PROPOFOL 10 MG/ML IV BOLUS
INTRAVENOUS | Status: AC
  Filled 2017-03-09: qty 20

## 2017-03-09 MED ORDER — ATORVASTATIN CALCIUM 40 MG PO TABS
40.0000 mg | ORAL_TABLET | Freq: Every day | ORAL | Status: DC
  Administered 2017-03-10 – 2017-03-14 (×4): 40 mg via ORAL
  Filled 2017-03-09 (×4): qty 1

## 2017-03-09 MED ORDER — DEXTROSE 5 % IV SOLN
2.0000 g | INTRAVENOUS | Status: DC
  Filled 2017-03-09: qty 2

## 2017-03-09 MED ORDER — FENTANYL CITRATE (PF) 250 MCG/5ML IJ SOLN
INTRAMUSCULAR | Status: AC
  Filled 2017-03-09: qty 5

## 2017-03-09 MED ORDER — DEXTROSE 5 % IV SOLN
INTRAVENOUS | Status: AC
  Filled 2017-03-09: qty 2

## 2017-03-09 MED ORDER — MIDAZOLAM HCL 2 MG/2ML IJ SOLN
INTRAMUSCULAR | Status: AC
  Filled 2017-03-09: qty 2

## 2017-03-09 MED ORDER — PHENYLEPHRINE 40 MCG/ML (10ML) SYRINGE FOR IV PUSH (FOR BLOOD PRESSURE SUPPORT)
PREFILLED_SYRINGE | INTRAVENOUS | Status: DC | PRN
  Administered 2017-03-09 (×4): 80 ug via INTRAVENOUS

## 2017-03-09 MED ORDER — LABETALOL HCL 5 MG/ML IV SOLN
INTRAVENOUS | Status: DC | PRN
  Administered 2017-03-09: 2.5 mg via INTRAVENOUS
  Administered 2017-03-09: 5 mg via INTRAVENOUS
  Administered 2017-03-09 (×2): 2.5 mg via INTRAVENOUS

## 2017-03-09 MED ORDER — DIPHENHYDRAMINE HCL 12.5 MG/5ML PO ELIX
12.5000 mg | ORAL_SOLUTION | Freq: Four times a day (QID) | ORAL | Status: DC | PRN
  Filled 2017-03-09: qty 5

## 2017-03-09 MED ORDER — BISACODYL 10 MG RE SUPP: 10.0000 mg | Freq: Two times a day (BID) | RECTAL | Status: DC | PRN

## 2017-03-09 MED ORDER — HYDROMORPHONE HCL 1 MG/ML IJ SOLN
0.5000 mg | INTRAMUSCULAR | Status: DC | PRN
  Administered 2017-03-09 (×2): 0.5 mg via INTRAVENOUS
  Filled 2017-03-09 (×2): qty 1

## 2017-03-09 MED ORDER — FLUTICASONE PROPIONATE 50 MCG/ACT NA SUSP: 1.0000 | Freq: Every day | NASAL | Status: DC | PRN

## 2017-03-09 MED ORDER — FENTANYL CITRATE (PF) 250 MCG/5ML IJ SOLN
INTRAMUSCULAR | Status: DC | PRN
  Administered 2017-03-09: 50 ug via INTRAVENOUS
  Administered 2017-03-09: 100 ug via INTRAVENOUS
  Administered 2017-03-09 (×2): 50 ug via INTRAVENOUS
  Administered 2017-03-09: 100 ug via INTRAVENOUS
  Administered 2017-03-09 (×3): 50 ug via INTRAVENOUS

## 2017-03-09 MED ORDER — INSULIN ASPART 100 UNIT/ML ~~LOC~~ SOLN
0.0000 [IU] | Freq: Three times a day (TID) | SUBCUTANEOUS | Status: DC
Start: 2017-03-09 — End: 2017-03-15
  Administered 2017-03-10 (×2): 2 [IU] via SUBCUTANEOUS
  Administered 2017-03-10: 3 [IU] via SUBCUTANEOUS
  Administered 2017-03-11 – 2017-03-14 (×5): 2 [IU] via SUBCUTANEOUS
  Administered 2017-03-15: 4 [IU] via SUBCUTANEOUS

## 2017-03-09 MED ORDER — SUCCINYLCHOLINE CHLORIDE 200 MG/10ML IV SOSY
PREFILLED_SYRINGE | INTRAVENOUS | Status: AC
  Filled 2017-03-09: qty 20

## 2017-03-09 MED ORDER — ONDANSETRON HCL 4 MG/2ML IJ SOLN
INTRAMUSCULAR | Status: DC | PRN
  Administered 2017-03-09: 4 mg via INTRAVENOUS

## 2017-03-09 MED ORDER — PROMETHAZINE HCL 25 MG/ML IJ SOLN
INTRAMUSCULAR | Status: AC
  Administered 2017-03-09: 6.25 mg
  Filled 2017-03-09: qty 1

## 2017-03-09 MED ORDER — LOSARTAN POTASSIUM 25 MG PO TABS
25.0000 mg | ORAL_TABLET | Freq: Every day | ORAL | Status: DC
  Administered 2017-03-09 – 2017-03-14 (×5): 25 mg via ORAL
  Filled 2017-03-09 (×5): qty 1

## 2017-03-09 MED ORDER — LACTATED RINGERS IR SOLN
Status: DC | PRN
  Administered 2017-03-09: 3000 mL

## 2017-03-09 MED ORDER — PROMETHAZINE HCL 25 MG/ML IJ SOLN: 6.2500 mg | INTRAMUSCULAR | Status: DC | PRN

## 2017-03-09 MED ORDER — ACETAMINOPHEN 650 MG RE SUPP: 650.0000 mg | Freq: Four times a day (QID) | RECTAL | Status: DC | PRN

## 2017-03-09 MED ORDER — PROPOFOL 10 MG/ML IV BOLUS
INTRAVENOUS | Status: DC | PRN
  Administered 2017-03-09: 150 mg via INTRAVENOUS

## 2017-03-09 MED ORDER — INSULIN ASPART 100 UNIT/ML ~~LOC~~ SOLN: 0.0000 [IU] | Freq: Every day | SUBCUTANEOUS | Status: DC

## 2017-03-09 MED ORDER — SUGAMMADEX SODIUM 200 MG/2ML IV SOLN
INTRAVENOUS | Status: DC | PRN
  Administered 2017-03-09: 150 mg via INTRAVENOUS

## 2017-03-09 MED ORDER — FENTANYL CITRATE (PF) 100 MCG/2ML IJ SOLN
25.0000 ug | INTRAMUSCULAR | Status: DC | PRN
  Administered 2017-03-09 – 2017-03-10 (×4): 25 ug via INTRAVENOUS
  Administered 2017-03-10: 50 ug via INTRAVENOUS
  Administered 2017-03-10 (×2): 25 ug via INTRAVENOUS
  Administered 2017-03-11: 50 ug via INTRAVENOUS
  Administered 2017-03-14: 100 ug via INTRAVENOUS
  Administered 2017-03-14 (×2): 50 ug via INTRAVENOUS
  Filled 2017-03-09 (×8): qty 2

## 2017-03-09 MED ORDER — CHLORHEXIDINE GLUCONATE CLOTH 2 % EX PADS: 6.0000 | MEDICATED_PAD | Freq: Once | CUTANEOUS | Status: DC

## 2017-03-09 MED ORDER — METRONIDAZOLE IN NACL 5-0.79 MG/ML-% IV SOLN
500.0000 mg | Freq: Three times a day (TID) | INTRAVENOUS | Status: DC
  Administered 2017-03-09 – 2017-03-10 (×4): 500 mg via INTRAVENOUS
  Filled 2017-03-09 (×6): qty 100

## 2017-03-09 MED ORDER — ONDANSETRON HCL 4 MG/2ML IJ SOLN
4.0000 mg | Freq: Once | INTRAMUSCULAR | Status: AC
  Administered 2017-03-09: 4 mg via INTRAVENOUS

## 2017-03-09 MED ORDER — CLINDAMYCIN PHOSPHATE 600 MG/50ML IV SOLN
600.0000 mg | Freq: Once | INTRAVENOUS | Status: DC
  Filled 2017-03-09: qty 50

## 2017-03-09 MED ORDER — BUPIVACAINE-EPINEPHRINE (PF) 0.5% -1:200000 IJ SOLN
INTRAMUSCULAR | Status: AC
  Filled 2017-03-09: qty 30

## 2017-03-09 MED ORDER — SIMETHICONE 80 MG PO CHEW
40.0000 mg | CHEWABLE_TABLET | Freq: Four times a day (QID) | ORAL | Status: DC | PRN
  Administered 2017-03-10: 40 mg via ORAL
  Filled 2017-03-09 (×2): qty 1

## 2017-03-09 MED ORDER — KETOROLAC TROMETHAMINE 30 MG/ML IJ SOLN: 30.0000 mg | Freq: Once | INTRAMUSCULAR | Status: DC | PRN

## 2017-03-09 MED ORDER — 0.9 % SODIUM CHLORIDE (POUR BTL) OPTIME
TOPICAL | Status: DC | PRN
  Administered 2017-03-09: 1000 mL

## 2017-03-09 MED ORDER — METHOCARBAMOL 1000 MG/10ML IJ SOLN
1000.0000 mg | Freq: Four times a day (QID) | INTRAVENOUS | Status: DC | PRN
  Filled 2017-03-09: qty 10

## 2017-03-09 MED ORDER — GABAPENTIN 300 MG PO CAPS
300.0000 mg | ORAL_CAPSULE | ORAL | Status: AC
  Administered 2017-03-09: 300 mg via ORAL
  Filled 2017-03-09: qty 1

## 2017-03-09 MED ORDER — ACETAMINOPHEN 500 MG PO TABS
1000.0000 mg | ORAL_TABLET | ORAL | Status: AC
  Administered 2017-03-09: 1000 mg via ORAL
  Filled 2017-03-09: qty 2

## 2017-03-09 MED ORDER — LACTATED RINGERS IV BOLUS (SEPSIS): 1000.0000 mL | Freq: Three times a day (TID) | INTRAVENOUS | Status: DC | PRN

## 2017-03-09 MED ORDER — PANTOPRAZOLE SODIUM 40 MG PO TBEC
40.0000 mg | DELAYED_RELEASE_TABLET | Freq: Every day | ORAL | Status: DC
  Administered 2017-03-09 – 2017-03-12 (×4): 40 mg via ORAL
  Filled 2017-03-09 (×4): qty 1

## 2017-03-09 MED ORDER — ENOXAPARIN SODIUM 40 MG/0.4ML ~~LOC~~ SOLN
40.0000 mg | SUBCUTANEOUS | Status: DC
  Administered 2017-03-10 – 2017-03-12 (×3): 40 mg via SUBCUTANEOUS
  Filled 2017-03-09 (×3): qty 0.4

## 2017-03-09 MED ORDER — HYDRALAZINE HCL 20 MG/ML IJ SOLN
5.0000 mg | INTRAMUSCULAR | Status: DC | PRN
  Administered 2017-03-10: 5 mg via INTRAVENOUS
  Filled 2017-03-09: qty 1

## 2017-03-09 MED ORDER — HYDROMORPHONE HCL 1 MG/ML IJ SOLN: 0.2500 mg | INTRAMUSCULAR | Status: DC | PRN

## 2017-03-09 MED ORDER — LACTATED RINGERS IV SOLN
INTRAVENOUS | Status: DC
  Administered 2017-03-09 – 2017-03-13 (×6): via INTRAVENOUS
  Administered 2017-03-15: 1000 mL via INTRAVENOUS

## 2017-03-09 MED ORDER — CLINDAMYCIN PHOSPHATE 600 MG/50ML IV SOLN
INTRAVENOUS | Status: DC | PRN
  Administered 2017-03-09: 600 mg via INTRAVENOUS

## 2017-03-09 MED ORDER — BUPIVACAINE HCL (PF) 0.25 % IJ SOLN
INTRAMUSCULAR | Status: AC
  Filled 2017-03-09: qty 30

## 2017-03-09 MED ORDER — PROCHLORPERAZINE EDISYLATE 5 MG/ML IJ SOLN: 5.0000 mg | INTRAMUSCULAR | Status: DC | PRN

## 2017-03-09 MED ORDER — METHOCARBAMOL 500 MG PO TABS
1000.0000 mg | ORAL_TABLET | Freq: Four times a day (QID) | ORAL | Status: DC | PRN
  Administered 2017-03-12 – 2017-03-14 (×2): 1000 mg via ORAL
  Filled 2017-03-09 (×2): qty 2

## 2017-03-09 MED ORDER — OXYCODONE HCL 5 MG PO TABS
5.0000 mg | ORAL_TABLET | ORAL | Status: DC | PRN
  Administered 2017-03-09 (×2): 10 mg via ORAL
  Administered 2017-03-10: 5 mg via ORAL
  Administered 2017-03-10 (×3): 10 mg via ORAL
  Administered 2017-03-10: 5 mg via ORAL
  Administered 2017-03-11 – 2017-03-15 (×5): 10 mg via ORAL
  Filled 2017-03-09: qty 1
  Filled 2017-03-09: qty 2
  Filled 2017-03-09 (×2): qty 1
  Filled 2017-03-09 (×2): qty 2
  Filled 2017-03-09: qty 1
  Filled 2017-03-09 (×6): qty 2

## 2017-03-09 MED ORDER — HYDROCORTISONE 1 % EX CREA
1.0000 "application " | TOPICAL_CREAM | Freq: Three times a day (TID) | CUTANEOUS | Status: DC | PRN
  Filled 2017-03-09: qty 28

## 2017-03-09 MED ORDER — LIP MEDEX EX OINT
1.0000 "application " | TOPICAL_OINTMENT | Freq: Two times a day (BID) | CUTANEOUS | Status: DC
  Administered 2017-03-09 – 2017-03-14 (×9): 1 via TOPICAL
  Filled 2017-03-09: qty 7

## 2017-03-09 MED ORDER — DIPHENHYDRAMINE HCL 50 MG/ML IJ SOLN: 12.5000 mg | Freq: Four times a day (QID) | INTRAMUSCULAR | Status: DC | PRN

## 2017-03-09 MED ORDER — DEXTROSE 5 % IV SOLN
5.0000 mg/kg | Freq: Once | INTRAVENOUS | Status: AC
  Administered 2017-03-09: 340 mg via INTRAVENOUS
  Filled 2017-03-09: qty 8.5

## 2017-03-09 MED ORDER — LIDOCAINE 2% (20 MG/ML) 5 ML SYRINGE
INTRAMUSCULAR | Status: DC | PRN
  Administered 2017-03-09: 1.5 mg/kg/h via INTRAVENOUS

## 2017-03-09 MED ORDER — ACETAMINOPHEN 325 MG PO TABS
650.0000 mg | ORAL_TABLET | Freq: Four times a day (QID) | ORAL | Status: DC | PRN
  Administered 2017-03-09 (×2): 650 mg via ORAL
  Filled 2017-03-09 (×2): qty 2

## 2017-03-09 MED ORDER — DEXTROSE 5 % IV SOLN
2.0000 g | INTRAVENOUS | Status: DC
  Administered 2017-03-09 (×2): 2 g via INTRAVENOUS
  Filled 2017-03-09: qty 2

## 2017-03-09 MED ORDER — PSYLLIUM 95 % PO PACK
1.0000 | PACK | Freq: Every day | ORAL | Status: DC
  Administered 2017-03-10 – 2017-03-12 (×2): 1 via ORAL
  Filled 2017-03-09 (×2): qty 1

## 2017-03-09 MED ORDER — IOPAMIDOL (ISOVUE-300) INJECTION 61%
INTRAVENOUS | Status: DC | PRN
  Administered 2017-03-09: 7 mL

## 2017-03-09 MED ORDER — BUPIVACAINE HCL (PF) 0.25 % IJ SOLN
INTRAMUSCULAR | Status: DC | PRN
  Administered 2017-03-09: 40 mL

## 2017-03-09 MED ORDER — MIDAZOLAM HCL 2 MG/2ML IJ SOLN
INTRAMUSCULAR | Status: DC | PRN
  Administered 2017-03-09: 2 mg via INTRAVENOUS

## 2017-03-09 MED ORDER — ONDANSETRON 4 MG PO TBDP: 4.0000 mg | ORAL_TABLET | Freq: Four times a day (QID) | ORAL | Status: DC | PRN

## 2017-03-09 MED ORDER — LACTATED RINGERS IV SOLN
INTRAVENOUS | Status: DC | PRN
Start: 2017-03-09 — End: 2017-03-09
  Administered 2017-03-09 (×2): via INTRAVENOUS

## 2017-03-09 MED ORDER — MENTHOL 3 MG MT LOZG
1.0000 | LOZENGE | OROMUCOSAL | Status: DC | PRN
  Filled 2017-03-09 (×2): qty 9

## 2017-03-09 MED ORDER — ASPIRIN EC 81 MG PO TBEC
81.0000 mg | DELAYED_RELEASE_TABLET | Freq: Every day | ORAL | Status: DC
  Administered 2017-03-09 – 2017-03-12 (×4): 81 mg via ORAL
  Filled 2017-03-09 (×4): qty 1

## 2017-03-09 MED ORDER — MAGIC MOUTHWASH
15.0000 mL | Freq: Four times a day (QID) | ORAL | Status: DC | PRN
  Filled 2017-03-09: qty 15

## 2017-03-09 MED ORDER — PHENYLEPHRINE 40 MCG/ML (10ML) SYRINGE FOR IV PUSH (FOR BLOOD PRESSURE SUPPORT)
PREFILLED_SYRINGE | INTRAVENOUS | Status: AC
  Filled 2017-03-09: qty 30

## 2017-03-09 MED ORDER — DEXAMETHASONE SODIUM PHOSPHATE 10 MG/ML IJ SOLN
INTRAMUSCULAR | Status: AC
  Filled 2017-03-09: qty 2

## 2017-03-09 MED ORDER — SUCCINYLCHOLINE CHLORIDE 200 MG/10ML IV SOSY
PREFILLED_SYRINGE | INTRAVENOUS | Status: DC | PRN
  Administered 2017-03-09: 100 mg via INTRAVENOUS

## 2017-03-09 MED ORDER — GUAIFENESIN-DM 100-10 MG/5ML PO SYRP: 10.0000 mL | ORAL_SOLUTION | ORAL | Status: DC | PRN

## 2017-03-09 MED ORDER — PHENOL 1.4 % MT LIQD
1.0000 | OROMUCOSAL | Status: DC | PRN
  Filled 2017-03-09 (×2): qty 177

## 2017-03-09 MED ORDER — EPHEDRINE 5 MG/ML INJ
INTRAVENOUS | Status: AC
  Filled 2017-03-09: qty 10

## 2017-03-09 MED ORDER — ONDANSETRON HCL 4 MG/2ML IJ SOLN
INTRAMUSCULAR | Status: AC
  Filled 2017-03-09: qty 4

## 2017-03-09 MED ORDER — DEXAMETHASONE SODIUM PHOSPHATE 10 MG/ML IJ SOLN
INTRAMUSCULAR | Status: DC | PRN
  Administered 2017-03-09: 10 mg via INTRAVENOUS

## 2017-03-09 MED ORDER — LIDOCAINE 2% (20 MG/ML) 5 ML SYRINGE
INTRAMUSCULAR | Status: DC | PRN
  Administered 2017-03-09: 60 mg via INTRAVENOUS

## 2017-03-09 MED ORDER — METFORMIN HCL ER 500 MG PO TB24
500.0000 mg | ORAL_TABLET | Freq: Every day | ORAL | Status: DC
  Administered 2017-03-10 – 2017-03-12 (×3): 500 mg via ORAL
  Filled 2017-03-09 (×3): qty 1

## 2017-03-09 MED ORDER — ONDANSETRON HCL 4 MG/2ML IJ SOLN
4.0000 mg | Freq: Four times a day (QID) | INTRAMUSCULAR | Status: DC | PRN
  Administered 2017-03-09 – 2017-03-14 (×6): 4 mg via INTRAVENOUS
  Filled 2017-03-09 (×6): qty 2

## 2017-03-09 MED ORDER — HYDROCORTISONE 2.5 % RE CREA
1.0000 "application " | TOPICAL_CREAM | Freq: Four times a day (QID) | RECTAL | Status: DC | PRN
  Filled 2017-03-09: qty 28.35

## 2017-03-09 MED ORDER — SUGAMMADEX SODIUM 200 MG/2ML IV SOLN
INTRAVENOUS | Status: AC
  Filled 2017-03-09: qty 4

## 2017-03-09 MED ORDER — METOCLOPRAMIDE HCL 5 MG/ML IJ SOLN
5.0000 mg | Freq: Four times a day (QID) | INTRAMUSCULAR | Status: DC | PRN
  Administered 2017-03-09: 10 mg via INTRAVENOUS
  Administered 2017-03-11: 5 mg via INTRAVENOUS
  Administered 2017-03-12: 10 mg via INTRAVENOUS
  Filled 2017-03-09 (×3): qty 2

## 2017-03-09 MED ORDER — LIDOCAINE 2% (20 MG/ML) 5 ML SYRINGE
INTRAMUSCULAR | Status: AC
  Filled 2017-03-09: qty 15

## 2017-03-09 SURGICAL SUPPLY — 43 items
APPLIER CLIP 5 13 M/L LIGAMAX5 (MISCELLANEOUS) ×2
CABLE HIGH FREQUENCY MONO STRZ (ELECTRODE) ×2 IMPLANT
CHLORAPREP W/TINT 26ML (MISCELLANEOUS) ×2 IMPLANT
CLIP APPLIE 5 13 M/L LIGAMAX5 (MISCELLANEOUS) ×1 IMPLANT
COVER MAYO STAND STRL (DRAPES) ×2 IMPLANT
COVER SURGICAL LIGHT HANDLE (MISCELLANEOUS) ×2 IMPLANT
DECANTER SPIKE VIAL GLASS SM (MISCELLANEOUS) ×2 IMPLANT
DRAIN CHANNEL 19F RND (DRAIN) IMPLANT
DRAIN CHANNEL RND F F (WOUND CARE) ×2 IMPLANT
DRAPE C-ARM 42X120 X-RAY (DRAPES) ×2 IMPLANT
DRAPE WARM FLUID 44X44 (DRAPE) ×2 IMPLANT
DRSG TEGADERM 4X4.75 (GAUZE/BANDAGES/DRESSINGS) ×2 IMPLANT
ELECT REM PT RETURN 15FT ADLT (MISCELLANEOUS) ×2 IMPLANT
ENDOLOOP SUT PDS II  0 18 (SUTURE)
ENDOLOOP SUT PDS II 0 18 (SUTURE) IMPLANT
EVACUATOR SILICONE 100CC (DRAIN) ×2 IMPLANT
GAUZE SPONGE 2X2 8PLY STRL LF (GAUZE/BANDAGES/DRESSINGS) ×1 IMPLANT
GLOVE ECLIPSE 8.0 STRL XLNG CF (GLOVE) ×2 IMPLANT
GLOVE INDICATOR 8.0 STRL GRN (GLOVE) ×2 IMPLANT
GOWN STRL REUS W/TWL XL LVL3 (GOWN DISPOSABLE) ×4 IMPLANT
IRRIG SUCT STRYKERFLOW 2 WTIP (MISCELLANEOUS) ×2
IRRIGATION SUCT STRKRFLW 2 WTP (MISCELLANEOUS) ×1 IMPLANT
KIT BASIN OR (CUSTOM PROCEDURE TRAY) ×2 IMPLANT
PAD POSITIONING PINK XL (MISCELLANEOUS) ×2 IMPLANT
POSITIONER SURGICAL ARM (MISCELLANEOUS) IMPLANT
POUCH RETRIEVAL ECOSAC 10 (ENDOMECHANICALS) ×1 IMPLANT
POUCH RETRIEVAL ECOSAC 10MM (ENDOMECHANICALS) ×1
POUCH SPECIMEN RETRIEVAL 10MM (ENDOMECHANICALS) IMPLANT
SCISSORS METZENBAUM CVD 33 (INSTRUMENTS) ×2 IMPLANT
SET CHOLANGIOGRAPH MIX (MISCELLANEOUS) ×2 IMPLANT
SHEARS HARMONIC ACE PLUS 36CM (ENDOMECHANICALS) ×2 IMPLANT
SPONGE GAUZE 2X2 STER 10/PKG (GAUZE/BANDAGES/DRESSINGS) ×1
SUT MNCRL AB 4-0 PS2 18 (SUTURE) ×2 IMPLANT
SUT PDS AB 1 CT1 27 (SUTURE) ×4 IMPLANT
SUT PDS AB 2-0 CT2 27 (SUTURE) ×2 IMPLANT
SUT PROLENE 2 0 SH DA (SUTURE) ×2 IMPLANT
SYR 20CC LL (SYRINGE) ×2 IMPLANT
TOWEL OR 17X26 10 PK STRL BLUE (TOWEL DISPOSABLE) ×2 IMPLANT
TOWEL OR NON WOVEN STRL DISP B (DISPOSABLE) ×2 IMPLANT
TRAY LAPAROSCOPIC (CUSTOM PROCEDURE TRAY) ×2 IMPLANT
TROCAR BLADELESS OPT 5 100 (ENDOMECHANICALS) ×2 IMPLANT
TROCAR BLADELESS OPT 5 150 (ENDOMECHANICALS) ×2 IMPLANT
TUBING INSUF HEATED (TUBING) ×2 IMPLANT

## 2017-03-09 NOTE — Discharge Instructions (Signed)
DRAIN CARE:   You have a closed bulb drain to help you heal.    A bulb drain is a small, plastic reservoir which creates a gentle suction. It is used to remove excess fluid from a surgical wound. The color and amount of fluid will vary. Immediately after surgery, the fluid is bright red. It may gradually change to a yellow color. When the amount decreases to about 1 or 2 tablespoons (15 to 30 cc) per 24 hours, your caregiver will usually remove it.  JP Care  The Jackson-Pratt drainage system has flexible tubing attached to a soft, plastic bulb with a stopper. The drainage end of the tubing, which is flat and white, goes into your body through a small opening near your incision (surgical cut). A stitch holds the drainage end in place. The rest of the tube is outside your body, attached to the bulb. When the bulb is compressed with the stopper in place, it creates a vacuum. This causes a constant gentle suction, which helps draw out fluid that collects under your incision. The bulb should be compressed at all times, except when you are emptying the drainage.  How long you will have your Jackson-Pratt depends on your surgery and the amount of fluid is draining. This is different for everyone. The Jackson-Pratt is usually removed when the drainage is 30 mL or less over 24 hours. To keep track of how much drainage youre having, you will record the amount in a drainage log. Its important to bring the log with you to your follow-up appointments.  Caring for Your Jackson-Pratt at Home In order to care for your Jackson-Pratt at home, you or your caregiver will do the following:  Empty the drain once a day and record the color and amount of drainage  Care for the area where the tubing enters your skin by washing with soap and water.  Milk the tubing to help move clots into the bulb.  Do this before you empty and measure your drainage. Look in the mirror at the tubing. This will help you see where your  hands need to be. Pinch the tubing close to where it goes into your skin between your thumb and forefinger. With the thumb and forefinger of your other hand, pinch the tubing right below your other fingers. Keep your fingers pinched and slide them down the tubing, pushing any clots down toward the bulb. You may want to use alcohol swabs to help you slide your fingers down the tubing. Repeat steps 3 and 4 as necessary to push clots from the tubing into the bulb. If you are not able to move a clot into the bulb, call your doctors office. The fluid may leak around the insertion site if a clot is blocking the drainage flow. If there is fluid in the bulb and no leakage at the insertion site, the drain is working.  How to Empty Your Jackson-Pratt and Record the Drainage You will need to empty your Jackson-Pratt every day  Gather the following supplies:  Measuring container your nurse gave you Jackson-Pratt Drainage Record  Pen or pencil  Instructions Clean an area to work on. Clean your hands thoroughly. Unplug the stopper on top of your Jackson-Pratt. This will cause the bulb to expand. Do not touch the inside of the stopper or the inner area of the opening on the bulb. Turn your Jackson-Pratt upside down, gently squeeze the bulb, and pour the drainage into the measuring container. Turn your Jackson-Pratt right  side up. Squeeze the bulb until your fingers feel the palm of your hand. Keep squeezing the bulb while you replug the stopper. Make sure the bulb stays fully compressed to ensure constant, gentle suction.    Check the amount and color of drainage in the measuring container. The first couple days after surgery the fluid may be dark red. This is normal. As you heal the fluid may look pink or pale yellow. Record this amount and the color of drainage on your Jackson-Pratt Drainage Record. Flush the drainage down the toilet and rinse the measuring container with water.  Caring for the  Insertion Site  Once you have emptied the drainage, clean your hands again. Check the area around the insertion site. Look for tenderness, swelling, or pus. If you have any of these, or if you have a temperature of 101 F (38.3 C) or higher, you may have an infection. Call your doctors office.  Sometimes, the drain causes redness the size of a dime at your insertion site. This is normal. Your healthcare provider will tell you if you should place a bandage over the insertion site.  Wash drain site with soap & water (dilute hydrogen peroxide PRN) daily & replace clean dressing / tape    DAILY CARE  Keep the bulb compressed at all times, except while emptying it. The compression creates suction.   Keep sites where the tubes enter the skin dry and covered with a light bandage (dressing).   Tape the tubes to your skin, 1 to 2 inches below the insertion sites, to keep from pulling on your stitches. Tubes are stitched in place and will not slip out.   Pin the bulb to your shirt (not to your pants) with a safety pin.   For the first few days after surgery, there usually is more fluid in the bulb. Empty the bulb whenever it becomes half full because the bulb does not create enough suction if it is too full. Include this amount in your 24 hour totals.   When the amount of drainage decreases, empty the bulb at the same time every day. Write down the amounts and the 24 hour totals. Your caregiver will want to know them. This helps your caregiver know when the tubes can be removed.   (We anticipate removing the drain in 1-3 weeks, depending on when the output is <21m a day for 2+ days)  If there is drainage around the tube sites, change dressings and keep the area dry. If you see a clot in the tube, leave it alone. However, if the tube does not appear to be draining, let your caregiver know.  TO EMPTY THE BULB  Open the stopper to release suction.   Holding the stopper out of the way, pour  drainage into the measuring cup that was sent home with you.   Measure and write down the amount. If there are 2 bulbs, note the amount of drainage from bulb 1 or bulb 2 and keep the totals separate. Your caregiver will want to know which tube is draining more.   Compress the bulb by folding it in half.   Replace the stopper.   Check the tape that holds the tube to your skin, and pin the bulb to your shirt.  SEEK MEDICAL CARE IF:  The drainage develops a bad odor.   You have an oral temperature above 102 F (38.9 C).   The amount of drainage from your wound suddenly increases or decreases.  You accidentally pull out your drain.   You have any other questions or concerns.  MAKE SURE YOU:   Understand these instructions.   Will watch your condition.   Will get help right away if you are not doing well or get worse.     Call our office if you have any questions about your drain. [phone]   LAPAROSCOPIC SURGERY: POST OP INSTRUCTIONS  ######################################################################  EAT Gradually transition to a high fiber diet with a fiber supplement over the next few weeks after discharge.  Start with a pureed / full liquid diet (see below)  WALK Walk an hour a day.  Control your pain to do that.    CONTROL PAIN Control pain so that you can walk, sleep, tolerate sneezing/coughing, go up/down stairs.  HAVE A BOWEL MOVEMENT DAILY Keep your bowels regular to avoid problems.  OK to try a laxative to override constipation.  OK to use an antidairrheal to slow down diarrhea.  Call if not better after 2 tries  CALL IF YOU HAVE PROBLEMS/CONCERNS Call if you are still struggling despite following these instructions. Call if you have concerns not answered by these instructions  ######################################################################    1. DIET: Follow a light bland diet the first 24 hours after arrival home, such as soup, liquids,  crackers, etc.  Be sure to include lots of fluids daily.  Avoid fast food or heavy meals as your are more likely to get nauseated.  Eat a low fat the next few days after surgery.   2. Take your usually prescribed home medications unless otherwise directed. 3. PAIN CONTROL: a. Pain is best controlled by a usual combination of three different methods TOGETHER: i. Ice/Heat ii. Over the counter pain medication iii. Prescription pain medication b. Most patients will experience some swelling and bruising around the incisions.  Ice packs or heating pads (30-60 minutes up to 6 times a day) will help. Use ice for the first few days to help decrease swelling and bruising, then switch to heat to help relax tight/sore spots and speed recovery.  Some people prefer to use ice alone, heat alone, alternating between ice & heat.  Experiment to what works for you.  Swelling and bruising can take several weeks to resolve.   c. It is helpful to take an over-the-counter pain medication regularly for the first few weeks.  Choose one of the following that works best for you: i. Naproxen (Aleve, etc)  Two 27m tabs twice a day ii. Ibuprofen (Advil, etc) Three 2065mtabs four times a day (every meal & bedtime) iii. Acetaminophen (Tylenol, etc) 500-65011mour times a day (every meal & bedtime) d. A  prescription for pain medication (such as oxycodone, hydrocodone, etc) should be given to you upon discharge.  Take your pain medication as prescribed.  i. If you are having problems/concerns with the prescription medicine (does not control pain, nausea, vomiting, rash, itching, etc), please call us [phone] see if we need to switch you to a different pain medicine that will work better for you and/or control your side effect better. ii. If you need a refill on your pain medication, please contact your pharmacy.  They will contact our office to request authorization. Prescriptions will not be filled after 5 pm or on  week-ends. 4. Avoid getting constipated.  Between the surgery and the pain medications, it is common to experience some constipation.  Increasing fluid intake and taking a fiber supplement (such as Metamucil, Citrucel, FiberCon, MiraLax,  etc) 1-2 times a day regularly will usually help prevent this problem from occurring.  A mild laxative (prune juice, Milk of Magnesia, MiraLax, etc) should be taken according to package directions if there are no bowel movements after 48 hours.   5. Watch out for diarrhea.  If you have many loose bowel movements, simplify your diet to bland foods & liquids for a few days.  Stop any stool softeners and decrease your fiber supplement.  Switching to mild anti-diarrheal medications (Kayopectate, Pepto Bismol) can help.  If this worsens or does not improve, please call us. 6. Wash / shower every day.  You may shower over the dressings as they are waterproof.  Continue to shower over incision(s) after the dressing is off. 7. Remove your waterproof bandages 5 days after surgery.  You may leave the incision open to air.  You may replace a dressing/Band-Aid to cover the incision for comfort if you wish.  8. ACTIVITIES as tolerated:   a. You may resume regular (light) daily activities beginning the next day--such as daily self-care, walking, climbing stairs--gradually increasing activities as tolerated.  If you can walk 30 minutes without difficulty, it is safe to try more intense activity such as jogging, treadmill, bicycling, low-impact aerobics, swimming, etc. b. Save the most intensive and strenuous activity for last such as sit-ups, heavy lifting, contact sports, etc  Refrain from any heavy lifting or straining until you are off narcotics for pain control.   c. DO NOT PUSH THROUGH PAIN.  Let pain be your guide: If it hurts to do something, don't do it.  Pain is your body warning you to avoid that activity for another week until the pain goes down. d. You may drive when you are  no longer taking prescription pain medication, you can comfortably wear a seatbelt, and you can safely maneuver your car and apply brakes. e. Dennis Bast may have sexual intercourse when it is comfortable.  9. FOLLOW UP in our office a. Please call [facility] at [phone] to set up an appointment to see your surgeon in the office for a follow-up appointment approximately 2-3 weeks after your surgery. b. Make sure that you call for this appointment the day you arrive home to insure a convenient appointment time. 10. IF YOU HAVE DISABILITY OR FAMILY LEAVE FORMS, BRING THEM TO THE OFFICE FOR PROCESSING.  DO NOT GIVE THEM TO YOUR DOCTOR.   WHEN TO CALL us [phone]: 1. Poor pain control 2. Reactions / problems with new medications (rash/itching, nausea, etc)  3. Fever over 101.5 F (38.5 C) 4. Inability to urinate 5. Nausea and/or vomiting 6. Worsening swelling or bruising 7. Continued bleeding from incision. 8. Increased pain, redness, or drainage from the incision   The clinic staff is available to answer your questions during regular business hours (8:30am-5pm).  Please dont hesitate to call and ask to speak to one of our nurses for clinical concerns.   If you have a medical emergency, go to the nearest emergency room or call 911.  A surgeon from [facility] is always on call at the [facility], [place], [place], [place], [place]  [address] ? MAIN: [phone] ? TOLL FREE: [phone] ?  FAX [phone] [email or web address]

## 2017-03-09 NOTE — Anesthesia Procedure Notes (Signed)
Date/Time: 03/09/2017 9:42 AM Performed by: Cynda Familia, CRNA Oxygen Delivery Method: Simple face mask Placement Confirmation: breath sounds checked- equal and bilateral and positive ETCO2 Dental Injury: Teeth and Oropharynx as per pre-operative assessment

## 2017-03-09 NOTE — Anesthesia Postprocedure Evaluation (Signed)
Anesthesia Post Note  Patient: BRITTNEY MUCHA  Procedure(s) Performed: LAPAROSCOPIC CHOLECYSTECTOMY WITH INTRAOPERATIVE CHOLANGIOGRAM, CLOSURE OF ABERRANT DUCT OF LUSCHKA (N/A Abdomen)     Patient location during evaluation: PACU Anesthesia Type: General Level of consciousness: awake and alert Pain management: pain level controlled Vital Signs Assessment: post-procedure vital signs reviewed and stable Respiratory status: spontaneous breathing, nonlabored ventilation, respiratory function stable and patient connected to nasal cannula oxygen Cardiovascular status: blood pressure returned to baseline and stable Postop Assessment: no apparent nausea or vomiting Anesthetic complications: no    Last Vitals:  Vitals:   03/09/17 1000 03/09/17 1015  BP: (!) 162/96 (!) 143/93  Pulse: 86 74  Resp: 16 10  Temp: 36.8 C   SpO2: 100% 100%    Last Pain:  Vitals:   03/09/17 1015  TempSrc:   PainSc: Asleep                 Lynkin Saini S

## 2017-03-09 NOTE — Addendum Note (Signed)
Addendum  created 03/09/17 1306 by Cynda Familia, CRNA   Child order released for a procedure order, Intraprocedure Blocks edited, Sign clinical note

## 2017-03-09 NOTE — Anesthesia Procedure Notes (Signed)
Procedure Name: Intubation Date/Time: 03/09/2017 7:51 AM Performed by: Cynda Familia, CRNA Pre-anesthesia Checklist: Patient identified, Emergency Drugs available, Suction available and Patient being monitored Patient Re-evaluated:Patient Re-evaluated prior to induction Oxygen Delivery Method: Circle System Utilized Preoxygenation: Pre-oxygenation with 100% oxygen Induction Type: IV induction, Rapid sequence and Cricoid Pressure applied Ventilation: Mask ventilation without difficulty Laryngoscope Size: Miller and 2 Grade View: Grade I Tube type: Oral Tube size: 7.0 mm Number of attempts: 1 Airway Equipment and Method: Stylet Placement Confirmation: ETT inserted through vocal cords under direct vision,  positive ETCO2 and breath sounds checked- equal and bilateral Secured at: 21 cm Tube secured with: Tape Dental Injury: Teeth and Oropharynx as per pre-operative assessment  Comments: Smooth IV induction (Rose present)-- intubation AM CRNA-- atraumatic--- teeth and mouth as preop-- missing teeth and chipped teeth present-- no changed to teeth and mouth after laryngoscopy-- bilat BS Rose

## 2017-03-09 NOTE — H&P (Addendum)
[name] [name], [place] [address] Phone: [phone] FAX: [place]  Apr 30, 1960 [id number]  CARE TEAM:  PCP: Whitney Reeve, DO  Outpatient Care Team: Patient Care Team: Whitney Reeve, DO as PCP - General (Osteopathic Medicine)  Inpatient Treatment Team: Treatment Team: Attending Provider: Charlesetta Shanks, MD; Technician: Whitney Neal, EMT; Registered Nurse: Whitney Garfinkel, RN; Consulting Physician: Whitney Pace, Md, MD   This patient is a 57 y.o.female who presents today for surgical evaluation at the request of Dr Whitney Coombe, MD, [facility]   Chief complaint / Reason for evaluation: Gallbladder attack with persistent pain.  Probable cholecystitis  Pleasant Hispanic female with intermittent attacks of upper abdominal pain.  Has been seeing Dr. Carol Neal with gastroenterology with workups including colonoscopy and other testing.  History of Barrett's esophagus in distant past but negative on repeat endoscopies in the past 10 years.  Placed on proton pump inhibitors.  However getting repeated attacks.  She does have a history of heartburn and reflux but this does not feel like that.  She had steak and potatoes for dinner and began to have sharp pain half hour later.  The pain would not go away.  Went to the emergency room at [facility].  Pain primarily right-sided.  Ultrasound confirmed gallstones with positive sonographic Murphy sign suspicious for early acute cholecystitis.  Surgical tunnel consultation requested.  Patient transferred to [place] in the hospital for surgical consultation.  Husband is at her bedside.  Patient had a hysterectomy and bladder repair in the past.  No other abdominal surgeries.  No cardiopulmonary issues.  She does not smoke.  She is not a diabetic.  She works in the [employer].  No personal nor family history of GI/colon cancer, inflammatory bowel  disease, irritable bowel syndrome, allergy such as Celiac Sprue, dietary/dairy problems, colitis, ulcers nor gastritis.  No recent sick contacts/gastroenteritis.  No travel outside the country.  No changes in diet.  No dysphagia to solids or liquids.  No significant heartburn or reflux.  No hematochezia, hematemesis, coffee ground emesis.  History of Barrett's esophagus in the past.  Repeat endoscopy 2011 and 2014 underwhelming.  Questionable history of prior ulcers.  None on those endoscopies either.   Assessment  Whitney Neal  56 y.o. female       Problem List:  Principal Problem:   Acute calculous cholecystitis Active Problems:   Hyperlipidemia   Essential hypertension   GERD   Hyperglycemia   History physical and ultrasound findings suspicious for acute cholecystitis  Plan:  Admit  IV fluids.  IV antibiotics  Pain and nausea control.  Laparoscopic cholecystectomy with cholangiogram given increased liver function tests:  The anatomy & physiology of hepatobiliary & pancreatic function was discussed.  The pathophysiology of gallbladder dysfunction was discussed.  Natural history risks without surgery was discussed.   I feel the risks of no intervention will lead to serious problems that outweigh the operative risks; therefore, I recommended cholecystectomy to remove the pathology.  I explained laparoscopic techniques with possible need for an open approach.  Probable cholangiogram to evaluate the bilary tract was explained as well.    Risks such as bleeding, infection, abscess, leak, injury to other organs, need for repair of tissues / organs, need for further treatment, stroke, heart attack, death, and other risks were discussed.  I noted a good likelihood this will help address the  problem.  Possibility that this will not correct all abdominal symptoms was explained.  Goals of post-operative recovery were discussed as well.  We will work to minimize complications.  An  educational handout further explaining the pathology and treatment options was given as well.  Questions were answered.  The patient expresses understanding & wishes to proceed with surgery.  Hypertension control.  GERD - PPI & PRN Maalox  Sliding scale insulin given glucose above 140 just in case.  Hold metformin for now.  Hypercholesterolemia: Resume statin  -VTE prophylaxis- SCDs, etc -mobilize as tolerated to help recovery  30 minutes spent in review, evaluation, examination, counseling, and coordination of care.  More than 50% of that time was spent in counseling.  Whitney Neal, M.D., F.A.C.S. Gastrointestinal and Minimally Invasive Surgery [facility] [address], [place], [place] [address] [phone] Main / Paging   03/09/2017      Past Medical History:  Diagnosis Date  . Atypical chest pain   . Barrett's esophagus    history of   . Cervical lymphadenopathy    left  . Cervical strain   . Constipation   . GERD (gastroesophageal reflux disease)   . Hypercholesteremia   . Hyperglycemia    a1c 6.3 7/12  . Hyperplastic colonic polyp    history of   . Hypertension   . Migraine   . Osteopenia   . Ulcer 2010  . Urinary incontinence   . Vitamin D deficiency     Past Surgical History:  Procedure Laterality Date  . ABDOMINAL HYSTERECTOMY  2009   Dr. Bethann Neal - benign disease  . BLADDER SURGERY     bladder mesh surgery 2010- Dr. Rhodia Neal  . BREAST CYST ASPIRATION    . COLONOSCOPY W/ POLYPECTOMY  2010    Social History   Socioeconomic History  . Marital status: Married    Spouse name: Not on file  . Number of children: 2  . Years of education: Not on file  . Highest education level: Not on file  Social Needs  . Financial resource strain: Not on file  . Food insecurity - worry: Not on file  . Food insecurity - inability: Not on file  . Transportation needs - medical: Not on file  . Transportation needs -  non-medical: Not on file  Occupational History  . Occupation: Chartered certified accountant    Employer: [employer]  Tobacco Use  . Smoking status: Never Smoker  . Smokeless tobacco: Never Used  Substance and Sexual Activity  . Alcohol use: No  . Drug use: No  . Sexual activity: Not on file  Other Topics Concern  . Not on file  Social History Narrative   Reviewed history from 07/04/2009 and no changes required.   Married- is guardian for two grandchildren ages 58 and 46.   Never Smoked   Alcohol use-no   Regular exercise-no   Works in housekeeping at [employer]          Family History  Problem Relation Age of Onset  . Colon cancer Father   . Hypertension Father   . Heart attack Father 20  . Cancer Father        prostate CA  . Pulmonary embolism Mother        deceased  . Hypertension Mother   . Diabetes Sister   . Hypertension Brother   . Hypertension Sister   . Cancer Paternal Grandmother  oral  . Thyroid disease Sister        1/2 sister thyroid problem  . Healthy Daughter   . Heart attack Brother 58  . Breast cancer Cousin     Current Facility-Administered Medications  Medication Dose Route Frequency Provider Last Rate Last Dose  . ondansetron (ZOFRAN) 4 MG/2ML injection            Current Outpatient Medications  Medication Sig Dispense Refill  . aspirin 81 MG EC tablet Take 81 mg by mouth daily.      Marland Kitchen atorvastatin (LIPITOR) 40 MG tablet Take 1 tablet (40 mg total) by mouth daily. 90 tablet 3  . Calcium Carbonate-Vitamin D (CALCIUM 600+D) 600-400 MG-UNIT per tablet Take 1 tablet by mouth 2 (two) times daily.      Marland Kitchen losartan (COZAAR) 25 MG tablet Take 1 tablet (25 mg total) daily by mouth. 90 tablet 3  . metFORMIN (GLUCOPHAGE XR) 500 MG 24 hr tablet Take 1 tablet (500 mg total) by mouth daily with breakfast. 90 tablet 3  . omeprazole (PRILOSEC) 40 MG capsule Take 1 capsule (40 mg total) by mouth daily. 30 capsule 3  . sodium bicarbonate 325 MG tablet Take 1-2  tablets (325-650 mg total) by mouth 2 (two) times daily. Prn heartburn 180 tablet 11  . sucralfate (CARAFATE) 1 GM/10ML suspension Take 10 mLs (1 g total) 4 (four) times daily -  with meals and at bedtime by mouth. 420 mL 1     Allergies  Allergen Reactions  . Codeine     REACTION: headache, visual disturbance  . Morphine And Related     ROS:   All other systems reviewed & are negative except per HPI or as noted below: Constitutional:  No fevers, chills, sweats.  Weight stable Eyes:  No vision changes, No discharge HENT:  No sore throats, nasal drainage Lymph: No neck swelling, No bruising easily Pulmonary:  No cough, productive sputum CV: No orthopnea, PND  Patient walks 30 minutes for about 1 miles without difficulty.  No exertional chest/neck/shoulder/arm pain. GI:  No personal nor family history of GI/colon cancer, inflammatory bowel disease, irritable bowel syndrome, allergy such as Celiac Sprue, dietary/dairy problems, colitis.  No recent sick contacts/gastroenteritis.  No travel outside the country.  No changes in diet. Renal: No UTIs, No hematuria Genital:  No drainage, bleeding, masses Musculoskeletal: No severe joint pain.  Good ROM major joints Skin:  No sores or lesions.  No rashes Heme/Lymph:  No easy bleeding.  No swollen lymph nodes Neuro: No focal weakness/numbness.  No seizures Psych: No suicidal ideation.  No hallucinations  BP (!) 153/95 (BP Location: Left Arm)   Pulse 90   Temp 97.6 F (36.4 C) (Oral)   Resp 16   Ht 5' (1.524 m)   Wt 68 kg (150 lb)   SpO2 98%   BMI 29.29 kg/m   Physical Exam: General: Pt awake/alert/oriented x4 in mild major acute distress Eyes: PERRL, normal EOM. Sclera nonicteric Neuro: CN II-XII intact w/o focal sensory/motor deficits. Lymph: No head/neck/groin lymphadenopathy Psych:  No delerium/psychosis/paranoia HENT: Normocephalic, Mucus membranes moist.  No thrush Neck: Supple, No tracheal deviation Chest: No pain.  Good  respiratory excursion. CV:  Pulses intact.  Regular rhythm Abdomen: Soft, Nondistended.  Tenderness in right upper quadrant only with mild Murphy sign.  Some epigastric discomfort.  The rest of the abdomen is nontender.  No incarcerated hernias. Gen:  No inguinal hernias.  No inguinal lymphadenopathy.   Ext:  SCDs  BLE.  No significant edema.  No cyanosis Skin: No petechiae / purpurea.  No major sores Musculoskeletal: No severe joint pain.  Good ROM major joints   Results:   Labs: Results for orders placed or performed during the hospital encounter of 03/08/17 (from the past 48 hour(s))  Troponin I     Status: None   Collection Time: 03/08/17  7:54 PM  Result Value Ref Range   Troponin I <0.03 <0.03 ng/mL  Comprehensive metabolic panel     Status: Abnormal   Collection Time: 03/08/17  7:54 PM  Result Value Ref Range   Sodium 138 135 - 145 mmol/L   Potassium 3.7 3.5 - 5.1 mmol/L   Chloride 103 101 - 111 mmol/L   CO2 23 22 - 32 mmol/L   Glucose, Bld 173 (H) 65 - 99 mg/dL   BUN 16 6 - 20 mg/dL   Creatinine, Ser 0.66 0.44 - 1.00 mg/dL   Calcium 9.0 8.9 - 10.3 mg/dL   Total Protein 7.3 6.5 - 8.1 g/dL   Albumin 3.9 3.5 - 5.0 g/dL   AST 84 (H) 15 - 41 U/L   ALT 40 14 - 54 U/L   Alkaline Phosphatase 130 (H) 38 - 126 U/L   Total Bilirubin 0.5 0.3 - 1.2 mg/dL   GFR calc non Af Amer >60 >60 mL/min   GFR calc Af Amer >60 >60 mL/min    Comment: (NOTE) The eGFR has been calculated using the CKD EPI equation. This calculation has not been validated in all clinical situations. eGFR's persistently <60 mL/min signify possible Chronic Kidney Disease.    Anion gap 12 5 - 15  CBC     Status: None   Collection Time: 03/08/17  7:54 PM  Result Value Ref Range   WBC 8.0 4.0 - 10.5 K/uL   RBC 4.61 3.87 - 5.11 MIL/uL   Hemoglobin 13.3 12.0 - 15.0 g/dL   HCT 39.1 36.0 - 46.0 %   MCV 84.8 78.0 - 100.0 fL   MCH 28.9 26.0 - 34.0 pg   MCHC 34.0 30.0 - 36.0 g/dL   RDW 13.3 11.5 - 15.5 %    Platelets 317 150 - 400 K/uL  Lipase, blood     Status: None   Collection Time: 03/08/17  7:54 PM  Result Value Ref Range   Lipase 31 11 - 51 U/L  D-dimer, quantitative (not at [facility])     Status: None   Collection Time: 03/08/17  7:54 PM  Result Value Ref Range   D-Dimer, Quant <0.27 0.00 - 0.50 ug/mL-FEU    Comment: (NOTE) At the manufacturer cut-off of 0.50 ug/mL FEU, this assay has been documented to exclude PE with a sensitivity and negative predictive value of 97 to 99%.  At this time, this assay has not been approved by the FDA to exclude DVT/VTE. Results should be correlated with clinical presentation.   Troponin I     Status: None   Collection Time: 03/08/17  9:50 PM  Result Value Ref Range   Troponin I <0.03 <0.03 ng/mL    Imaging / Studies: Dg Chest 2 View  Result Date: 03/08/2017 CLINICAL DATA:  Acute right chest pain today. EXAM: CHEST  2 VIEW COMPARISON:  None. FINDINGS: The cardiomediastinal silhouette is unremarkable. There is no evidence of focal airspace disease, pulmonary edema, suspicious pulmonary nodule/mass, pleural effusion, or pneumothorax. No acute bony abnormalities are identified. IMPRESSION: No active cardiopulmonary disease. Electronically Signed   By: Cleatis Polka.D.  On: 03/08/2017 20:18   US Abdomen Limited Ruq  Result Date: 03/08/2017 CLINICAL DATA:  Right upper quadrant pain EXAM: ULTRASOUND ABDOMEN LIMITED RIGHT UPPER QUADRANT COMPARISON:  02/01/2017 FINDINGS: Gallbladder: Multiple mobile gallstones. Positive sonographic Murphy. Normal wall thickness Common bile duct: Diameter: 4 mm Liver: Small echogenic mass measuring 8 mm in the right lobe. Portal vein is patent on color Doppler imaging with normal direction of blood flow towards the liver. IMPRESSION: 1. Cholelithiasis with positive sonographic Murphy; gallbladder wall thickness is normal however, concern is for an acute cholecystitis. 2. Negative for biliary dilatation 3. Stable 8 mm echogenic  mass in the right liver. Electronically Signed   By: Donavan Foil M.D.   On: 03/08/2017 22:56    Medications / Allergies: per chart  Antibiotics: Anti-infectives (From admission, onward)   None        Note: Portions of this report may have been transcribed using voice recognition software. Every effort was made to ensure accuracy; however, inadvertent computerized transcription errors may be present.   Any transcriptional errors that result from this process are unintentional.    Whitney Neal, M.D., F.A.C.S. Gastrointestinal and Minimally Invasive Surgery [facility] [address] [place], [place] [address] [phone] Main / Paging   03/09/2017

## 2017-03-09 NOTE — ED Notes (Signed)
Patient received from [place] from [place]. Dr. Johney Maine to be paged.

## 2017-03-09 NOTE — ED Notes (Signed)
ED TO INPATIENT HANDOFF REPORT  Name/Age/Gender Whitney Neal 57 y.o. female  Code Status    Code Status Orders  (From admission, onward)        Start     Ordered   03/09/17 0154  Full code  Continuous     03/09/17 0157    Code Status History    Date Active Date Inactive Code Status Order ID Comments User Context   This patient has a current code status but no historical code status.      Home/SNF/Other Home  Chief Complaint Dizziness  Level of Care/Admitting Diagnosis ED Disposition    ED Disposition Condition Comment   Admit  Hospital Area: [facility] [[id number]]  Level of Care: Med-Surg [16]  Diagnosis: Acute cholecystitis with chronic cholecystitis [109323]  Admitting Physician: CCS, [place]  Attending Physician: CCS, MD [[id number]]  Estimated length of stay: past midnight tomorrow  Certification:: I certify this patient will need inpatient services for at least 2 midnights  Bed request comments: 5W prefered  PT Class (Do Not Modify): Inpatient [101]  PT Acc Code (Do Not Modify): Private [1]       Medical History Past Medical History:  Diagnosis Date  . Atypical chest pain   . Barrett's esophagus    history of.  Normal EGD 2011, 2014  . Cervical lymphadenopathy    left  . Cervical strain   . Constipation   . GERD (gastroesophageal reflux disease)   . Hypercholesteremia   . Hyperglycemia    a1c 6.3 7/12  . Hyperplastic colonic polyp    history of   . Hypertension   . Migraine   . Osteopenia   . Ulcer 2010  . Urinary incontinence   . Vitamin D deficiency     Allergies Allergies  Allergen Reactions  . Codeine     REACTION: headache, visual disturbance  . Morphine And Related     IV Location/Drains/Wounds Patient Lines/Drains/Airways Status   Active Line/Drains/Airways    Name:   Placement date:   Placement time:   Site:   Days:   Peripheral IV 03/08/17 Right Antecubital   03/08/17    2001    Antecubital   1          Labs/Imaging Results for orders placed or performed during the hospital encounter of 03/08/17 (from the past 48 hour(s))  Troponin I     Status: None   Collection Time: 03/08/17  7:54 PM  Result Value Ref Range   Troponin I <0.03 <0.03 ng/mL  Comprehensive metabolic panel     Status: Abnormal   Collection Time: 03/08/17  7:54 PM  Result Value Ref Range   Sodium 138 135 - 145 mmol/L   Potassium 3.7 3.5 - 5.1 mmol/L   Chloride 103 101 - 111 mmol/L   CO2 23 22 - 32 mmol/L   Glucose, Bld 173 (H) 65 - 99 mg/dL   BUN 16 6 - 20 mg/dL   Creatinine, Ser 0.66 0.44 - 1.00 mg/dL   Calcium 9.0 8.9 - 10.3 mg/dL   Total Protein 7.3 6.5 - 8.1 g/dL   Albumin 3.9 3.5 - 5.0 g/dL   AST 84 (H) 15 - 41 U/L   ALT 40 14 - 54 U/L   Alkaline Phosphatase 130 (H) 38 - 126 U/L   Total Bilirubin 0.5 0.3 - 1.2 mg/dL   GFR calc non Af Amer >60 >60 mL/min   GFR calc Af Amer >60 >60 mL/min  Comment: (NOTE) The eGFR has been calculated using the CKD EPI equation. This calculation has not been validated in all clinical situations. eGFR's persistently <60 mL/min signify possible Chronic Kidney Disease.    Anion gap 12 5 - 15  CBC     Status: None   Collection Time: 03/08/17  7:54 PM  Result Value Ref Range   WBC 8.0 4.0 - 10.5 K/uL   RBC 4.61 3.87 - 5.11 MIL/uL   Hemoglobin 13.3 12.0 - 15.0 g/dL   HCT 39.1 36.0 - 46.0 %   MCV 84.8 78.0 - 100.0 fL   MCH 28.9 26.0 - 34.0 pg   MCHC 34.0 30.0 - 36.0 g/dL   RDW 13.3 11.5 - 15.5 %   Platelets 317 150 - 400 K/uL  Lipase, blood     Status: None   Collection Time: 03/08/17  7:54 PM  Result Value Ref Range   Lipase 31 11 - 51 U/L  D-dimer, quantitative (not at [facility])     Status: None   Collection Time: 03/08/17  7:54 PM  Result Value Ref Range   D-Dimer, Quant <0.27 0.00 - 0.50 ug/mL-FEU    Comment: (NOTE) At the manufacturer cut-off of 0.50 ug/mL FEU, this assay has been documented to exclude PE with a sensitivity and negative predictive value of 97  to 99%.  At this time, this assay has not been approved by the FDA to exclude DVT/VTE. Results should be correlated with clinical presentation.   Troponin I     Status: None   Collection Time: 03/08/17  9:50 PM  Result Value Ref Range   Troponin I <0.03 <0.03 ng/mL   Dg Chest 2 View  Result Date: 03/08/2017 CLINICAL DATA:  Acute right chest pain today. EXAM: CHEST  2 VIEW COMPARISON:  None. FINDINGS: The cardiomediastinal silhouette is unremarkable. There is no evidence of focal airspace disease, pulmonary edema, suspicious pulmonary nodule/mass, pleural effusion, or pneumothorax. No acute bony abnormalities are identified. IMPRESSION: No active cardiopulmonary disease. Electronically Signed   By: Margarette Canada M.D.   On: 03/08/2017 20:18   US Abdomen Limited Ruq  Result Date: 03/08/2017 CLINICAL DATA:  Right upper quadrant pain EXAM: ULTRASOUND ABDOMEN LIMITED RIGHT UPPER QUADRANT COMPARISON:  02/01/2017 FINDINGS: Gallbladder: Multiple mobile gallstones. Positive sonographic Murphy. Normal wall thickness Common bile duct: Diameter: 4 mm Liver: Small echogenic mass measuring 8 mm in the right lobe. Portal vein is patent on color Doppler imaging with normal direction of blood flow towards the liver. IMPRESSION: 1. Cholelithiasis with positive sonographic Murphy; gallbladder wall thickness is normal however, concern is for an acute cholecystitis. 2. Negative for biliary dilatation 3. Stable 8 mm echogenic mass in the right liver. Electronically Signed   By: Donavan Foil M.D.   On: 03/08/2017 22:56    Pending Labs Unresulted Labs (From admission, onward)   Start     Ordered   03/16/17 0500  Creatinine, serum  (enoxaparin (LOVENOX)    CrCl >/= 30 ml/min)  Weekly,   R    Comments:  while on enoxaparin therapy    03/09/17 0157   03/09/17 0156  Hemoglobin A1c  Once,   R    Comments:  To assess prior glycemic control    03/09/17 0157   03/09/17 0154  HIV antibody (Routine Testing)  Once,   R      03/09/17 0157      Vitals/Pain Today's Vitals   03/08/17 2321 03/08/17 2340 03/08/17 2352 03/09/17 0104  BP: Marland Kitchen)  146/90   (!) 153/95  Pulse: (!) 104   90  Resp: (!) 22   16  Temp:      TempSrc:      SpO2: 98%   98%  Weight:      Height:      PainSc: 0-No pain 0-No pain 0-No pain 5     Isolation Precautions No active isolations  Medications Medications  ondansetron (ZOFRAN) 4 MG/2ML injection (not administered)  Chlorhexidine Gluconate Cloth 2 % PADS 6 each (not administered)    And  Chlorhexidine Gluconate Cloth 2 % PADS 6 each (not administered)  gabapentin (NEURONTIN) capsule 300 mg (not administered)  acetaminophen (TYLENOL) tablet 1,000 mg (not administered)  celecoxib (CELEBREX) capsule 200 mg (not administered)  cefTRIAXone (ROCEPHIN) 2 g in dextrose 5 % 50 mL IVPB (not administered)    And  metroNIDAZOLE (FLAGYL) IVPB 500 mg (0 mg Intravenous Stopped 03/09/17 0242)  enoxaparin (LOVENOX) injection 40 mg (not administered)  lactated ringers infusion ( Intravenous New Bag/Given 03/09/17 0245)  acetaminophen (TYLENOL) tablet 650 mg (650 mg Oral Given 03/09/17 0245)    Or  acetaminophen (TYLENOL) suppository 650 mg ( Rectal See Alternative 03/09/17 0245)  diphenhydrAMINE (BENADRYL) 12.5 MG/5ML elixir 12.5 mg (not administered)    Or  diphenhydrAMINE (BENADRYL) injection 12.5 mg (not administered)  ondansetron (ZOFRAN-ODT) disintegrating tablet 4 mg ( Oral See Alternative 03/09/17 0246)    Or  ondansetron (ZOFRAN) injection 4 mg (4 mg Intravenous Given 03/09/17 0246)  simethicone (MYLICON) chewable tablet 40 mg (not administered)  insulin aspart (novoLOG) injection 0-15 Units (not administered)  insulin aspart (novoLOG) injection 0-5 Units (not administered)  cefTRIAXone (ROCEPHIN) 2 g in dextrose 5 % 50 mL IVPB (2 g Intravenous New Bag/Given 03/09/17 0225)  lactated ringers bolus 1,000 mL (not administered)  methocarbamol (ROBAXIN) 1,000 mg in dextrose 5 % 50 mL IVPB (not  administered)  methocarbamol (ROBAXIN) tablet 1,000 mg (not administered)  gabapentin (NEURONTIN) capsule 300 mg (not administered)  lip balm (CARMEX) ointment 1 application (not administered)  magic mouthwash (not administered)  psyllium (HYDROCIL/METAMUCIL) packet 1 packet (not administered)  bisacodyl (DULCOLAX) suppository 10 mg (not administered)  prochlorperazine (COMPAZINE) injection 5-10 mg (not administered)  metoCLOPramide (REGLAN) injection 5-10 mg (not administered)  hydrALAZINE (APRESOLINE) injection 5-10 mg (not administered)  guaiFENesin-dextromethorphan (ROBITUSSIN DM) 100-10 MG/5ML syrup 10 mL (not administered)  hydrocortisone (ANUSOL-HC) 2.5 % rectal cream 1 application (not administered)  alum & mag hydroxide-simeth (MAALOX/MYLANTA) 200-200-20 MG/5ML suspension 30 mL (not administered)  hydrocortisone cream 1 % 1 application (not administered)  menthol-cetylpyridinium (CEPACOL) lozenge 3 mg (not administered)  phenol (CHLORASEPTIC) mouth spray 1-2 spray (not administered)  gi cocktail (Maalox,Lidocaine,Donnatal) (30 mLs Oral Given 03/08/17 2008)  fentaNYL (SUBLIMAZE) injection 50 mcg (50 mcg Intravenous Given 03/08/17 2008)  ondansetron (ZOFRAN) injection 4 mg (4 mg Intravenous Given 03/08/17 2008)  gi cocktail (Maalox,Lidocaine,Donnatal) (30 mLs Oral Given 03/08/17 2218)  fentaNYL (SUBLIMAZE) injection 50 mcg (50 mcg Intravenous Given 03/08/17 2247)  HYDROmorphone (DILAUDID) injection 1 mg (1 mg Intravenous Given 03/08/17 2315)  ondansetron (ZOFRAN) injection 4 mg (4 mg Intravenous Given 03/09/17 0025)    Mobility walks

## 2017-03-09 NOTE — Anesthesia Preprocedure Evaluation (Addendum)
Anesthesia Evaluation  Patient identified by MRN, date of birth, ID band Patient awake    Reviewed: Allergy & Precautions, NPO status , Patient's Chart, lab work & pertinent test results  Airway Mallampati: II  TM Distance: >3 FB Neck ROM: Full    Dental no notable dental hx. (+) Dental Advisory Given, Chipped, Missing   Pulmonary neg pulmonary ROS,    Pulmonary exam normal breath sounds clear to auscultation       Cardiovascular hypertension, Normal cardiovascular exam Rhythm:Regular Rate:Normal     Neuro/Psych negative neurological ROS  negative psych ROS   GI/Hepatic Neg liver ROS, GERD  ,  Endo/Other  negative endocrine ROSdiabetes, Type 2, Oral Hypoglycemic Agents  Renal/GU negative Renal ROS  negative genitourinary   Musculoskeletal negative musculoskeletal ROS (+)   Abdominal   Peds negative pediatric ROS (+)  Hematology negative hematology ROS (+)   Anesthesia Other Findings   Reproductive/Obstetrics negative OB ROS                          Anesthesia Physical Anesthesia Plan  ASA: II  Anesthesia Plan: General   Post-op Pain Management:    Induction: Intravenous  PONV Risk Score and Plan: 3 and Ondansetron, Dexamethasone, Midazolam and Treatment may vary due to age or medical condition  Airway Management Planned: Oral ETT  Additional Equipment:   Intra-op Plan:   Post-operative Plan: Extubation in OR  Informed Consent: I have reviewed the patients History and Physical, chart, labs and discussed the procedure including the risks, benefits and alternatives for the proposed anesthesia with the patient or authorized representative who has indicated his/her understanding and acceptance.   Dental advisory given  Plan Discussed with: CRNA and Surgeon  Anesthesia Plan Comments:         Anesthesia Quick Evaluation

## 2017-03-09 NOTE — Op Note (Signed)
03/09/2017  PATIENT:  Whitney Neal  57 y.o. 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  ASSISTANT: OR Staff   ANESTHESIA:    General with endotracheal intubation Local anesthetic as a field block  EBL:  (See Anesthesia Intraoperative Record) Total I/O In: 900 [I.V.:900] Out: 100 [Blood:100]  Delay start of Pharmacological VTE agent (>24hrs) due to surgical blood loss or risk of bleeding:  no  DRAINS: 19 Fr Blake closed bulb drain in the RUQ   SPECIMEN: Gallbladder    DISPOSITION OF SPECIMEN:  PATHOLOGY  COUNTS:  YES  PLAN OF CARE: Admit to inpatient   PATIENT DISPOSITION:  PACU - hemodynamically stable.  INDICATION: Pleasant middle-aged woman with intermittent attacks of abdominal pain.  Initially vague but became more suspicious for biliary colic.  Had an attack that would not go away with worsening pain.  Came to emergency room.  Admitted.  I offered laparoscopic cholecystectomy  The anatomy & physiology of hepatobiliary & pancreatic function was discussed.  The pathophysiology of gallbladder dysfunction was discussed.  Natural history risks without surgery was discussed.   I feel the risks of no intervention will lead to serious problems that outweigh the operative risks; therefore, I recommended cholecystectomy to remove the pathology.  I explained laparoscopic techniques with possible need for an open approach.  Probable  cholangiogram to evaluate the bilary tract was explained as well.    Risks such as bleeding, infection, abscess, leak, injury to other organs, need for further treatment, heart attack, death, and other risks were discussed.  I noted a good likelihood this will help address the problem.  Possibility that this will not correct all abdominal symptoms was explained.  Goals of post-operative recovery were discussed as well.  We will work to minimize complications.  An educational handout further explaining the pathology and treatment options was given as well.  Questions were answered.  The patient expresses understanding & wishes to proceed with surgery.  OR FINDINGS: Thick gallbladder wall with some adhesions consistent with chronic cholecystitis.  Very distended and Turgeon with some edema consistent with acute cholecystitis.  Very superficial bile duct coming off a secondary branch of right hepatic duct consistent with bile duct of Luschka.  Surgically ligated with 2-0 PDS laparoscopically.  Liver: Fatty steatohepatitis  DESCRIPTION:   The patient was identified & brought in the operating room. The patient was positioned supine with arms tucked. SCDs were active during the entire case. The patient underwent general anesthesia without any difficulty.  The abdomen was prepped and draped in a sterile fashion. A Surgical Timeout confirmed our plan.  I made a transverse curvilinear incision through the superior umbilical fold.  I placed a 75m long port through the supraumbilical fascia using a modified Hassan cutdown technique with umbilical stalk fascial countertraction. I began carbon dioxide insufflation.  No change in end tidal CO2 measurement.   Camera inspection revealed no injury. There were no adhesions to the anterior abdominal wall supraumbilically.  I proceeded to continue with single site technique. I placed a #5  port in left upper aspect of the wound. I placed a 5 mm atraumatic grasper in the  right inferior aspect of the wound.  I turned attention to the right upper quadrant.  Bladder is very distended with some edema across some chronic changes consistent with acute on chronic cholecystitis.  The gallbladder fundus was elevated cephalad. I freed adhesions to the ventral surface of the gallbladder off carefully.  I did have to decompress the gallbladder as it was very distended, removing some thick bile and very small stones.  I freed the peritoneal coverings between the gallbladder and the liver on the posteriolateral and anteriomedial walls. I alternated between Harmonic & blunt Maryland dissection to help get a good critical view of the cystic artery and cystic duct. I did further dissection to free the posterior two thirds of the gallbladder off the liver bed to get a good critical view of the infundibulum and cystic duct.  It was quite intrahepatic and inflamed with poor liver tissues.  I dissected out the cystic artery; and, after getting a good 360 view, ligated the anterior & posterior branches of the cystic artery close on the infundibulum using the Harmonic ultrasonic dissection.  Reinforced the cystic arterial ligation with some clips as well  I skeletonized the cystic duct.  I placed a clip on the infundibulum. I did a partial cystic duct-otomy and ensured patency. I placed a 5 [place] cholangiocatheter through a puncture site at the right subcostal ridge of the abdominal wall and directed it into the cystic duct.  We ran a cholangiogram with dilute radio-opaque contrast and continuous fluoroscopy. Contrast flowed from a side branch consistent with cystic duct cannulization. Contrast flowed up the common hepatic duct into the right and left intrahepatic chains out to secondary radicals. Contrast flowed down the common bile duct easily across the normal ampulla into the duodenum.  There is just a trickle down into the duodenum.  There was obvious leaking of contrast.  I do not seem to be  coming from the cystic duct stump but from a branch off the right hepatic duct.  Suspicious for a aberrant duct of Luschka.    I upsize the left periumbilical port to 10 mm and placed another 35m port in the right mid abdomen.  I flushed the cholangiocatheter with isotonic fluid to confirm a small wispy leak on the posterior gallbladder fossa consistent with a duct of Lushka.  Removed the cholangiocatheter. I placed clips on the cystic duct x4. I completed cystic duct transection.  I assured hemostasis on the gallbladder fossa liver bed and irrigated carefully.  I again reinspected to confirm the small bile duct of Luschka 38mopening draining bile.   I ligated this aberrent GB fossa bile duct of Lushka with 2-0 PDS in a figure-of-eight fashion x3 using laparoscopic intracorporeal suturing.  That appeared to close the exposed bile duct of Luschka.  I irrigated and inspected meticulously.  Saw no more leaking of bile.  Hemostasis was good.  I freed the gallbladder from its remaining attachments to the liver. I ensured hemostasis on the gallbladder fossa of the liver and elsewhere. I inspected the rest of the abdomen & detected no injury nor bleeding elsewhere.  I removed the gallbladder out the supraumbilical fascia.  Placed the 10 mm port.  I did reinspection irrigation.  I placed a drain and brought it out the right paramedian port site.  I laid it in the retrohepatic space and over the gallbladder fossa over towards  the lesser curvature of the stomach.  I brought up some greater omentum to help plug up the region well.  I evacuated carbon dioxide.  I closed the periumbilical fascia sites transversely using #1 PDS interrupted stitches. I closed the skin using 4-0 monocryl stitch.  Drain secured with 2-0 Prolene.  Sterile dressing was applied. The patient was extubated & arrived in the PACU in stable condition..  I had discussed postoperative care with the patient in the holding area. I discussed operative  findings, updated the patient's status, discussed probable steps to recovery, and gave postoperative recommendations to the patient's spouse.  Recommendations were made.  Questions were answered.  He expressed understanding & appreciation.  [name], M.D., F.A.C.S. Gastrointestinal and Minimally Invasive Surgery [facility] [address], [place], [place] [address] [phone] Main / Paging  03/09/2017 9:46 AM

## 2017-03-09 NOTE — ED Notes (Signed)
Dr Gross at bedside

## 2017-03-09 NOTE — Transfer of Care (Signed)
Immediate Anesthesia Transfer of Care Note  Patient: Whitney Neal  Procedure(s) Performed: LAPAROSCOPIC CHOLECYSTECTOMY WITH INTRAOPERATIVE CHOLANGIOGRAM, CLOSURE OF ABERRANT DUCT OF LUSCHKA (N/A Abdomen)  Patient Location: PACU  Anesthesia Type:General  Level of Consciousness: sedated  Airway & Oxygen Therapy: Patient Spontanous Breathing and Patient connected to face mask oxygen  Post-op Assessment: Report given to RN and Post -op Vital signs reviewed and stable  Post vital signs: Reviewed and stable  Last Vitals:  Vitals:   03/09/17 0104 03/09/17 0601  BP: (!) 153/95 139/85  Pulse: 90 99  Resp: 16 16  Temp:  37 C  SpO2: 98% 99%    Last Pain:  Vitals:   03/09/17 0601  TempSrc: Oral  PainSc:       Patients Stated Pain Goal: 2 (04/59/97 7414)  Complications: No apparent anesthesia complications

## 2017-03-10 ENCOUNTER — Encounter (HOSPITAL_COMMUNITY): Payer: Self-pay | Admitting: Surgery

## 2017-03-10 LAB — COMPREHENSIVE METABOLIC PANEL
ALT: 178 U/L — ABNORMAL HIGH (ref 14–54)
AST: 149 U/L — ABNORMAL HIGH (ref 15–41)
Albumin: 3.7 g/dL (ref 3.5–5.0)
Alkaline Phosphatase: 127 U/L — ABNORMAL HIGH (ref 38–126)
Anion gap: 10 (ref 5–15)
BUN: 11 mg/dL (ref 6–20)
CO2: 24 mmol/L (ref 22–32)
Calcium: 9.1 mg/dL (ref 8.9–10.3)
Chloride: 103 mmol/L (ref 101–111)
Creatinine, Ser: 0.56 mg/dL (ref 0.44–1.00)
GFR calc Af Amer: >60 mL/min (ref >60)
GFR calc non Af Amer: >60 mL/min (ref >60)
Glucose, Bld: 146 mg/dL — ABNORMAL HIGH (ref 65–99)
Potassium: 4.1 mmol/L (ref 3.5–5.1)
Sodium: 137 mmol/L (ref 135–145)
Total Bilirubin: 0.5 mg/dL (ref 0.3–1.2)
Total Protein: 7.2 g/dL (ref 6.5–8.1)

## 2017-03-10 LAB — CBC
HCT: 41 % (ref 36.0–46.0)
Hemoglobin: 13.5 g/dL (ref 12.0–15.0)
MCH: 28.5 pg (ref 26.0–34.0)
MCHC: 32.9 g/dL (ref 30.0–36.0)
MCV: 86.5 fL (ref 78.0–100.0)
Platelets: 375 10*3/uL (ref 150–400)
RBC: 4.74 MIL/uL (ref 3.87–5.11)
RDW: 13.8 % (ref 11.5–15.5)
WBC: 9.5 10*3/uL (ref 4.0–10.5)

## 2017-03-10 LAB — GLUCOSE, CAPILLARY
Glucose-Capillary: 126 mg/dL — ABNORMAL HIGH (ref 65–99)
Glucose-Capillary: 190 mg/dL — ABNORMAL HIGH (ref 65–99)
Glucose-Capillary: 126 mg/dL — ABNORMAL HIGH (ref 65–99)
Glucose-Capillary: 112 mg/dL — ABNORMAL HIGH (ref 65–99)

## 2017-03-10 MED ORDER — ENALAPRILAT 1.25 MG/ML IV SOLN
0.6250 mg | Freq: Four times a day (QID) | INTRAVENOUS | Status: DC | PRN
  Filled 2017-03-10: qty 1

## 2017-03-10 MED ORDER — METOPROLOL TARTRATE 5 MG/5ML IV SOLN
5.0000 mg | Freq: Four times a day (QID) | INTRAVENOUS | Status: DC | PRN
  Administered 2017-03-11 – 2017-03-14 (×2): 5 mg via INTRAVENOUS
  Filled 2017-03-10 (×2): qty 5

## 2017-03-10 MED ORDER — HYDRALAZINE HCL 20 MG/ML IJ SOLN
5.0000 mg | INTRAMUSCULAR | Status: DC | PRN
  Administered 2017-03-11: 5 mg via INTRAVENOUS
  Administered 2017-03-14: 10 mg via INTRAVENOUS
  Filled 2017-03-10 (×2): qty 1

## 2017-03-10 MED ORDER — ONDANSETRON HCL 4 MG PO TABS
4.0000 mg | ORAL_TABLET | Freq: Three times a day (TID) | ORAL | Status: AC
  Administered 2017-03-10 – 2017-03-11 (×4): 4 mg via ORAL
  Filled 2017-03-10 (×4): qty 1

## 2017-03-10 MED ORDER — ACETAMINOPHEN 500 MG PO TABS
1000.0000 mg | ORAL_TABLET | Freq: Three times a day (TID) | ORAL | Status: DC
  Administered 2017-03-10 – 2017-03-14 (×12): 1000 mg via ORAL
  Filled 2017-03-10 (×13): qty 2

## 2017-03-10 MED ORDER — CIPROFLOXACIN IN D5W 400 MG/200ML IV SOLN
400.0000 mg | Freq: Two times a day (BID) | INTRAVENOUS | Status: DC
  Administered 2017-03-10 – 2017-03-11 (×4): 400 mg via INTRAVENOUS
  Filled 2017-03-10 (×5): qty 200

## 2017-03-10 MED ORDER — METRONIDAZOLE IN NACL 5-0.79 MG/ML-% IV SOLN
500.0000 mg | Freq: Four times a day (QID) | INTRAVENOUS | Status: AC
  Administered 2017-03-10 – 2017-03-11 (×5): 500 mg via INTRAVENOUS
  Filled 2017-03-10 (×5): qty 100

## 2017-03-10 MED ORDER — GABAPENTIN 300 MG PO CAPS
300.0000 mg | ORAL_CAPSULE | Freq: Two times a day (BID) | ORAL | Status: DC
  Administered 2017-03-10 – 2017-03-14 (×8): 300 mg via ORAL
  Filled 2017-03-10 (×8): qty 1

## 2017-03-10 NOTE — Progress Notes (Signed)
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    Assessment  Fair - probable ileus  Plan:  -keep at liquids -pain control -scheduled Tylenol and gabapentin.  As needed narcotics and muscle relaxants.  Ice/heat.  Keep drain for at least 2 weeks.  Still some bile in the drain but improved.  Moderate risk for ligated duct of loose, on gallbladder fossa of liver to open up again and require ERCP with stenting.  Follow.  IV antibiotics for at least 5 more days.  Question of allergy to Rocephin.  Therefore Cipro/Flagyl per protocol.  Hypertension control.  On home Cozaar.  As needed backup.  Nausea control.  Try scheduled Zofran for a day.  Continue as needed backup.  Hyperglycemia - metformin & SSI backup  GERD - PPI  Hyperchol -  liitor  Patient concerned about losing her job.  Try to do note she had had emergency surgery and needs time to recover.  This is not a simple routine gallbladder.  We will asked social work/case management to help evaluate and support.  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  -VTE prophylaxis- SCDs, etc  -mobilize as tolerated to help recovery  20 minutes spent in review, evaluation, examination, counseling, and coordination of care.  More than 50% of that time was spent in counseling.  I updated the patient's status to the patient.  Recommendations were made.  Questions were answered.  She expressed understanding & appreciation.   [name], M.D., F.A.C.S. Gastrointestinal and Minimally Invasive Surgery [facility] [address], [place], [place] [address] [phone] Main / Paging   03/10/2017    Subjective: (Chief complaint)  Very sore  N/V last night - less now.  Full w Dys1 diet  Worried about losing job  Objective:  Vital signs:  Vitals:   03/09/17 2150 03/10/17 0015 03/10/17 0210 03/10/17 0523  BP: (!) 187/105 (!) 179/97 (!) 163/109 (!) 133/99  Pulse: 98 (!) 109 (!) 110 (!) 120  Resp: _0 Temp: 98.8 F (37.1 C)  99.1 F (37.3 C) 99.6 F (37.6 C)  TempSrc: Oral  Oral Oral  SpO2: 100%  98% 100%  Weight:      Height:        Last BM Date: 03/06/17  Intake/Output   Yesterday:  01/19 0701 - 01/20 0700 In: 4010 [P.O.:420; I.V.:3800; IV Piggyback:150]  Out: 625 [Urine:200; Drains:325; Blood:100] This shift:  No intake/output data recorded.  Bowel function:  Flatus: No  BM:  No  Drain: Thinly Sanginobilious in bulb - but tubing serosanguinous   Physical Exam:  General: Pt awake/alert/oriented x4 in mild acute distress Eyes: PERRL, normal EOM.  Sclera clear.  No icterus Neuro: CN II-XII intact w/o focal sensory/motor deficits. Lymph: No head/neck/groin  lymphadenopathy Psych:  No delerium/psychosis/paranoia HENT: Normocephalic, Mucus membranes moist.  No thrush Neck: Supple, No tracheal deviation Chest: No chest wall pain w good excursion CV:  Pulses intact.  Regular rhythm MS: Normal AROM mjr joints.  No obvious deformity  Abdomen: Somewhat firm.  Mildy distended.  Mildly tender at incisions only.  No evidence of peritonitis.  No incarcerated hernias.  Ext:  No deformity.  No mjr edema.  No cyanosis Skin: No petechiae / purpura  Results:   Labs: Results for orders placed or performed during the hospital encounter of 03/08/17 (from the past 48 hour(s))  Troponin I     Status: None   Collection Time: 03/08/17  7:54 PM  Result Value Ref Range   Troponin I <0.03 <0.03 ng/mL  Comprehensive metabolic panel     Status: Abnormal   Collection Time: 03/08/17  7:54 PM  Result Value Ref Range   Sodium 138 135 - 145 mmol/L   Potassium 3.7 3.5 - 5.1 mmol/L   Chloride 103 101 - 111 mmol/L   CO2 23 22 - 32 mmol/L   Glucose, Bld 173 (H) 65 - 99 mg/dL   BUN 16 6 - 20 mg/dL   Creatinine, Ser 0.66 0.44 - 1.00 mg/dL   Calcium 9.0 8.9 - 10.3 mg/dL   Total Protein 7.3 6.5 - 8.1 g/dL   Albumin 3.9 3.5 - 5.0 g/dL   AST 84 (H) 15 - 41 U/L   ALT 40 14 - 54 U/L   Alkaline Phosphatase 130 (H) 38 - 126 U/L   Total Bilirubin 0.5 0.3 - 1.2 mg/dL   GFR calc non Af Amer >60 >60 mL/min   GFR calc Af Amer >60 >60 mL/min    Comment: (NOTE) The eGFR has been calculated using the CKD EPI equation. This calculation has not been validated in all clinical situations. eGFR's persistently <60 mL/min signify possible Chronic Kidney Disease.    Anion gap 12 5 - 15  CBC     Status: None   Collection Time: 03/08/17  7:54 PM  Result Value Ref Range   WBC 8.0 4.0 - 10.5 K/uL   RBC 4.61 3.87 - 5.11 MIL/uL   Hemoglobin 13.3 12.0 - 15.0 g/dL   HCT 39.1 36.0 - 46.0 %   MCV 84.8 78.0 - 100.0 fL   MCH 28.9 26.0 - 34.0 pg   MCHC 34.0 30.0 - 36.0 g/dL   RDW 13.3  11.5 - 15.5 %   Platelets 317 150 - 400 K/uL  Lipase, blood     Status: None   Collection Time: 03/08/17  7:54 PM  Result Value Ref Range   Lipase 31 11 - 51 U/L  D-dimer, quantitative (not at [facility])     Status: None   Collection Time: 03/08/17  7:54 PM  Result Value Ref Range   D-Dimer, Quant <0.27 0.00 - 0.50 ug/mL-FEU    Comment: (NOTE) At the manufacturer cut-off of 0.50 ug/mL FEU, this assay has been documented to exclude PE with a sensitivity and negative predictive value of 97 to 99%.  At this time, this assay  has not been approved by the FDA to exclude DVT/VTE. Results should be correlated with clinical presentation.   Troponin I     Status: None   Collection Time: 03/08/17  9:50 PM  Result Value Ref Range   Troponin I <0.03 <0.03 ng/mL  Surgical PCR screen     Status: Abnormal   Collection Time: 03/09/17  3:06 AM  Result Value Ref Range   MRSA, PCR INVALID RESULTS, SPECIMEN SENT FOR CULTURE (A) NEGATIVE   Staphylococcus aureus INVALID RESULTS, SPECIMEN SENT FOR CULTURE (A) NEGATIVE    Comment: (NOTE) The Xpert SA Assay (FDA approved for NASAL specimens in patients 19 years of age and older), is one component of a comprehensive surveillance program. It is not intended to diagnose infection nor to guide or monitor treatment.   MRSA culture     Status: None   Collection Time: 03/09/17  3:06 AM  Result Value Ref Range   Specimen Description NOSE    Special Requests NONE    Culture      NO STAPHYLOCOCCUS AUREUS ISOLATED Performed at [facility], [address], [place], [place] [address]    Report Status 03/09/2017 FINAL   Glucose, capillary     Status: Abnormal   Collection Time: 03/09/17  3:07 AM  Result Value Ref Range   Glucose-Capillary 127 (H) 65 - 99 mg/dL  HIV antibody (Routine Testing)     Status: None   Collection Time: 03/09/17  5:33 AM  Result Value Ref Range   HIV Screen 4th Generation wRfx Non Reactive Non Reactive    Comment:  (NOTE) Performed At: [facility] [place], [place] [id number] Rush Farmer MD [id number]   Hemoglobin A1c     Status: Abnormal   Collection Time: 03/09/17  5:33 AM  Result Value Ref Range   Hgb A1c MFr Bld 5.7 (H) 4.8 - 5.6 %    Comment: (NOTE) Pre diabetes:          5.7%-6.4% Diabetes:              >6.4% Glycemic control for   <7.0% adults with diabetes    Mean Plasma Glucose 116.89 mg/dL    Comment: Performed at [facility], [place] [address], [place], [place] [address]  Glucose, capillary     Status: Abnormal   Collection Time: 03/09/17 10:27 AM  Result Value Ref Range   Glucose-Capillary 159 (H) 65 - 99 mg/dL  Glucose, capillary     Status: Abnormal   Collection Time: 03/09/17  3:14 PM  Result Value Ref Range   Glucose-Capillary 157 (H) 65 - 99 mg/dL  Glucose, capillary     Status: Abnormal   Collection Time: 03/09/17 10:11 PM  Result Value Ref Range   Glucose-Capillary 125 (H) 65 - 99 mg/dL  Comprehensive metabolic panel     Status: Abnormal   Collection Time: 03/10/17  4:25 AM  Result Value Ref Range   Sodium 137 135 - 145 mmol/L   Potassium 4.1 3.5 - 5.1 mmol/L   Chloride 103 101 - 111 mmol/L   CO2 24 22 - 32 mmol/L   Glucose, Bld 146 (H) 65 - 99 mg/dL   BUN 11 6 - 20 mg/dL   Creatinine, Ser 0.56 0.44 - 1.00 mg/dL   Calcium 9.1 8.9 - 10.3 mg/dL   Total Protein 7.2 6.5 - 8.1 g/dL   Albumin 3.7 3.5 - 5.0 g/dL   AST 149 (H) 15 - 41 U/L  ALT 178 (H) 14 - 54 U/L   Alkaline Phosphatase 127 (H) 38 - 126 U/L   Total Bilirubin 0.5 0.3 - 1.2 mg/dL   GFR calc non Af Amer >60 >60 mL/min   GFR calc Af Amer >60 >60 mL/min    Comment: (NOTE) The eGFR has been calculated using the CKD EPI equation. This calculation has not been validated in all clinical situations. eGFR's persistently <60 mL/min signify possible Chronic Kidney Disease.    Anion gap 10 5 - 15  CBC     Status: None   Collection Time: 03/10/17  4:25 AM  Result Value Ref  Range   WBC 9.5 4.0 - 10.5 K/uL   RBC 4.74 3.87 - 5.11 MIL/uL   Hemoglobin 13.5 12.0 - 15.0 g/dL   HCT 41.0 36.0 - 46.0 %   MCV 86.5 78.0 - 100.0 fL   MCH 28.5 26.0 - 34.0 pg   MCHC 32.9 30.0 - 36.0 g/dL   RDW 13.8 11.5 - 15.5 %   Platelets 375 150 - 400 K/uL  Glucose, capillary     Status: Abnormal   Collection Time: 03/10/17  7:47 AM  Result Value Ref Range   Glucose-Capillary 126 (H) 65 - 99 mg/dL    Imaging / Studies: Dg Chest 2 View  Result Date: 03/08/2017 CLINICAL DATA:  Acute right chest pain today. EXAM: CHEST  2 VIEW COMPARISON:  None. FINDINGS: The cardiomediastinal silhouette is unremarkable. There is no evidence of focal airspace disease, pulmonary edema, suspicious pulmonary nodule/mass, pleural effusion, or pneumothorax. No acute bony abnormalities are identified. IMPRESSION: No active cardiopulmonary disease. Electronically Signed   By: Margarette Canada M.D.   On: 03/08/2017 20:18   Dg Cholangiogram Operative  Result Date: 03/09/2017 CLINICAL DATA:  Calculus cholecystitis EXAM: INTRAOPERATIVE CHOLANGIOGRAM TECHNIQUE: Cholangiographic images from the C-arm fluoroscopic device were submitted for interpretation post-operatively. Please see the procedural report for the amount of contrast and the fluoroscopy time utilized. COMPARISON:  03/08/2017 FINDINGS: Intraoperative cholangiogram performed during the operative procedure. There is leakage of contrast at the gallbladder fossa along the right liver margin from the cystic duct catheter. The biliary confluence, common hepatic duct, residual cystic duct, and common bile duct are patent. Contrast does drain into the duodenum. Mixing artifact noted throughout the biliary tree. Negative for significant obstruction, filling defect, or stricture. IMPRESSION: Patent biliary system. Electronically Signed   By: Jerilynn Mages.  Shick M.D.   On: 03/09/2017 08:54   US Abdomen Limited Ruq  Result Date: 03/08/2017 CLINICAL DATA:  Right upper quadrant pain  EXAM: ULTRASOUND ABDOMEN LIMITED RIGHT UPPER QUADRANT COMPARISON:  02/01/2017 FINDINGS: Gallbladder: Multiple mobile gallstones. Positive sonographic Murphy. Normal wall thickness Common bile duct: Diameter: 4 mm Liver: Small echogenic mass measuring 8 mm in the right lobe. Portal vein is patent on color Doppler imaging with normal direction of blood flow towards the liver. IMPRESSION: 1. Cholelithiasis with positive sonographic Murphy; gallbladder wall thickness is normal however, concern is for an acute cholecystitis. 2. Negative for biliary dilatation 3. Stable 8 mm echogenic mass in the right liver. Electronically Signed   By: Donavan Foil M.D.   On: 03/08/2017 22:56    Medications / Allergies: per chart  Antibiotics: Anti-infectives (From admission, onward)   Start     Dose/Rate Route Frequency Ordered Stop   03/10/17 1200  metroNIDAZOLE (FLAGYL) IVPB 500 mg     500 mg 100 mL/hr over 60 Minutes Intravenous Every 6 hours 03/10/17 0851 03/11/17 1759   03/10/17 1000  ciprofloxacin (CIPRO) IVPB 400 mg     400 mg 200 mL/hr over 60 Minutes Intravenous Every 12 hours 03/10/17 0851 03/12/17 2159   03/09/17 0815  clindamycin (CLEOCIN) IVPB 600 mg  Status:  Discontinued     600 mg 100 mL/hr over 30 Minutes Intravenous  Once 03/09/17 0813 03/10/17 0851   03/09/17 0815  gentamicin (GARAMYCIN) 340 mg in dextrose 5 % 50 mL IVPB     5 mg/kg  68 kg 117 mL/hr over 30 Minutes Intravenous  Once 03/09/17 0813 03/09/17 0921   03/09/17 0731  dextrose 5 % with cefTRIAXone (ROCEPHIN) ADS Med  Status:  Discontinued    Comments:  Octavio Graves   : cabinet override      03/09/17 0731 03/09/17 0817   03/09/17 0200  cefTRIAXone (ROCEPHIN) 2 g in dextrose 5 % 50 mL IVPB  Status:  Discontinued     2 g 100 mL/hr over 30 Minutes Intravenous On call to O.R. 03/09/17 0128 03/09/17 0307   03/09/17 0200  cefTRIAXone (ROCEPHIN) 2 g in dextrose 5 % 50 mL IVPB  Status:  Discontinued     2 g 100 mL/hr over 30 Minutes  Intravenous Every 24 hours 03/09/17 0157 03/10/17 0851   03/09/17 0145  metroNIDAZOLE (FLAGYL) IVPB 500 mg  Status:  Discontinued     500 mg 100 mL/hr over 60 Minutes Intravenous Every 8 hours 03/09/17 0128 03/10/17 0851        Note: Portions of this report may have been transcribed using voice recognition software. Every effort was made to ensure accuracy; however, inadvertent computerized transcription errors may be present.   Any transcriptional errors that result from this process are unintentional.     [name], M.D., F.A.C.S. Gastrointestinal and Minimally Invasive Surgery [facility] [address] [place], [place] [address] [phone] Main / Paging   03/10/2017

## 2017-03-11 LAB — HEPATIC FUNCTION PANEL
ALT: 143 U/L — ABNORMAL HIGH (ref 14–54)
AST: 77 U/L — ABNORMAL HIGH (ref 15–41)
Albumin: 3.7 g/dL (ref 3.5–5.0)
Alkaline Phosphatase: 104 U/L (ref 38–126)
Bilirubin, Direct: 0.2 mg/dL (ref 0.1–0.5)
Indirect Bilirubin: 0.8 mg/dL (ref 0.3–0.9)
Total Bilirubin: 1 mg/dL (ref 0.3–1.2)
Total Protein: 6.9 g/dL (ref 6.5–8.1)

## 2017-03-11 LAB — CREATININE, SERUM
Creatinine, Ser: 0.55 mg/dL (ref 0.44–1.00)
GFR calc Af Amer: >60 mL/min (ref >60)
GFR calc non Af Amer: >60 mL/min (ref >60)

## 2017-03-11 LAB — CBC
HCT: 39 % (ref 36.0–46.0)
Hemoglobin: 12.7 g/dL (ref 12.0–15.0)
MCH: 28.3 pg (ref 26.0–34.0)
MCHC: 32.6 g/dL (ref 30.0–36.0)
MCV: 87.1 fL (ref 78.0–100.0)
Platelets: 347 10*3/uL (ref 150–400)
RBC: 4.48 MIL/uL (ref 3.87–5.11)
RDW: 14.1 % (ref 11.5–15.5)
WBC: 12.1 10*3/uL — ABNORMAL HIGH (ref 4.0–10.5)

## 2017-03-11 LAB — GLUCOSE, CAPILLARY
Glucose-Capillary: 123 mg/dL — ABNORMAL HIGH (ref 65–99)
Glucose-Capillary: 124 mg/dL — ABNORMAL HIGH (ref 65–99)
Glucose-Capillary: 119 mg/dL — ABNORMAL HIGH (ref 65–99)
Glucose-Capillary: 114 mg/dL — ABNORMAL HIGH (ref 65–99)

## 2017-03-11 LAB — POTASSIUM: Potassium: 4.1 mmol/L (ref 3.5–5.1)

## 2017-03-11 LAB — MAGNESIUM: Magnesium: 2.2 mg/dL (ref 1.7–2.4)

## 2017-03-11 MED ORDER — SIMETHICONE 80 MG PO CHEW
40.0000 mg | CHEWABLE_TABLET | Freq: Four times a day (QID) | ORAL | Status: DC | PRN
  Administered 2017-03-13 – 2017-03-15 (×3): 40 mg via ORAL
  Filled 2017-03-11 (×3): qty 1

## 2017-03-11 NOTE — Progress Notes (Signed)
PA paged due to patient's resting heart rate ranging from 125-130 bpm. Will continue to monitor and await orders.

## 2017-03-11 NOTE — Progress Notes (Signed)
[facility] Progress Note  2 Days Post-Op  Subjective: CC: ileus Patient with pain reasonably well controlled. Has been walking some already today. Some nausea with PO intake. No flatus. Denies chest pain, SOB, palpitations.  UOP good. Tachy, afebrile.   Objective: Vital signs in last 24 hours: Temp:  [98.2 F (36.8 C)-99.7 F (37.6 C)] 99.7 F (37.6 C) (01/21 1117) Pulse Rate:  [96-119] 96 (01/21 1117) Resp:  [18] 18 (01/21 1117) BP: (156-181)/(91-106) 156/91 (01/21 1117) SpO2:  [91 %-98 %] 98 % (01/21 1117) Last BM Date: 03/06/17  Intake/Output from previous day: 01/20 0701 - 01/21 0700 In: 3166.7 [P.O.:1380; I.V.:1486.7; IV Piggyback:300] Out: 400 [Urine:200; Drains:200] Intake/Output this shift: Total I/O In: 1360 [P.O.:660; I.V.:200; IV Piggyback:500] Out: 110 [Drains:110]  PE: Gen:  Alert, NAD, pleasant Card:  Sinus tachycardia, pedal pulses 2+ BL Pulm:  Normal effort, clear to auscultation bilaterally Abd: Soft, appropriately tender, mildly distended, bowel sounds hypoactive, no HSM, incisions C/D/I Skin: warm and dry, no rashes  Psych: A&Ox3   Lab Results:  Recent Labs    03/10/17 0425 03/11/17 0434  WBC 9.5 12.1*  HGB 13.5 12.7  HCT 41.0 39.0  PLT 375 347   BMET Recent Labs    03/08/17 1954 03/10/17 0425 03/11/17 0434  NA 138 137  --   K 3.7 4.1 4.1  CL 103 103  --   CO2 23 24  --   GLUCOSE 173* 146*  --   BUN 16 11  --   CREATININE 0.66 0.56 0.55  CALCIUM 9.0 9.1  --    PT/INR No results for input(s): LABPROT, INR in the last 72 hours. CMP     Component Value Date/Time   NA 137 03/10/2017 0425   K 4.1 03/11/2017 0434   CL 103 03/10/2017 0425   CO2 24 03/10/2017 0425   GLUCOSE 146 (H) 03/10/2017 0425   BUN 11 03/10/2017 0425   BUN 12 12/07/2015   CREATININE 0.55 03/11/2017 0434   CREATININE 0.65 12/28/2016 1027   CALCIUM 9.1 03/10/2017 0425   PROT 6.9 03/11/2017 0434   ALBUMIN 3.7 03/11/2017 0434   AST 77 (H)  03/11/2017 0434   ALT 143 (H) 03/11/2017 0434   ALKPHOS 104 03/11/2017 0434   BILITOT 1.0 03/11/2017 0434   GFRNONAA >60 03/11/2017 0434   GFRNONAA 98 12/28/2016 1027   GFRAA >60 03/11/2017 0434   GFRAA 113 12/28/2016 1027   Lipase     Component Value Date/Time   LIPASE 31 03/08/2017 1954       Studies/Results: No results found.  Anti-infectives: Anti-infectives (From admission, onward)   Start     Dose/Rate Route Frequency Ordered Stop   03/10/17 1200  metroNIDAZOLE (FLAGYL) IVPB 500 mg     500 mg 100 mL/hr over 60 Minutes Intravenous Every 6 hours 03/10/17 0851 03/11/17 1346   03/10/17 1000  ciprofloxacin (CIPRO) IVPB 400 mg     400 mg 200 mL/hr over 60 Minutes Intravenous Every 12 hours 03/10/17 0851 03/12/17 2159   03/09/17 0815  clindamycin (CLEOCIN) IVPB 600 mg  Status:  Discontinued     600 mg 100 mL/hr over 30 Minutes Intravenous  Once 03/09/17 0813 03/10/17 0851   03/09/17 0815  gentamicin (GARAMYCIN) 340 mg in dextrose 5 % 50 mL IVPB     5 mg/kg  68 kg 117 mL/hr over 30 Minutes Intravenous  Once 03/09/17 0813 03/09/17 0921   03/09/17 0731  dextrose 5 % with cefTRIAXone (ROCEPHIN) ADS  Med  Status:  Discontinued    Comments:  Octavio Graves   : cabinet override      03/09/17 0731 03/09/17 0817   03/09/17 0200  cefTRIAXone (ROCEPHIN) 2 g in dextrose 5 % 50 mL IVPB  Status:  Discontinued     2 g 100 mL/hr over 30 Minutes Intravenous On call to O.R. 03/09/17 0128 03/09/17 0307   03/09/17 0200  cefTRIAXone (ROCEPHIN) 2 g in dextrose 5 % 50 mL IVPB  Status:  Discontinued     2 g 100 mL/hr over 30 Minutes Intravenous Every 24 hours 03/09/17 0157 03/10/17 0851   03/09/17 0145  metroNIDAZOLE (FLAGYL) IVPB 500 mg  Status:  Discontinued     500 mg 100 mL/hr over 60 Minutes Intravenous Every 8 hours 03/09/17 0128 03/10/17 0851       Assessment/Plan HTN/tachycardia - prn lopressor, improving some Hyperglycemia - metformin +SSI GERD - PPI  Acute on Chronic  Calculus Cholecystitis - s/p lap chole with IOC 1/19 Dr. Johney Maine - POD#2 - drain with 310 cc since yesterday AM - bile tinged - continue abx Aberrent biliary duct of Luschka on gallbladder fossa - s/p ligation of biliary duct of Luschka  Fatty steatohepatitis Ileus - nausea with PO intake - NPO with ice chips and sips with meds - ambulate 5+ times per day   FEN: NPO  VTE: SCDs, lovenox  ID: cipro/flagyl    LOS: 2 days    Brigid Re , [facility] 03/11/2017, 2:53 PM Pager: [phone] Mon-Fri 7:00 am-4:30 pm Sat-Sun 7:00 am-11:30 am

## 2017-03-12 ENCOUNTER — Inpatient Hospital Stay (HOSPITAL_COMMUNITY): Payer: 59

## 2017-03-12 LAB — COMPREHENSIVE METABOLIC PANEL
ALT: 96 U/L — ABNORMAL HIGH (ref 14–54)
AST: 41 U/L (ref 15–41)
Albumin: 3.6 g/dL (ref 3.5–5.0)
Alkaline Phosphatase: 95 U/L (ref 38–126)
Anion gap: 7 (ref 5–15)
BUN: 12 mg/dL (ref 6–20)
CO2: 26 mmol/L (ref 22–32)
Calcium: 8.5 mg/dL — ABNORMAL LOW (ref 8.9–10.3)
Chloride: 104 mmol/L (ref 101–111)
Creatinine, Ser: 0.63 mg/dL (ref 0.44–1.00)
GFR calc Af Amer: >60 mL/min (ref >60)
GFR calc non Af Amer: >60 mL/min (ref >60)
Glucose, Bld: 109 mg/dL — ABNORMAL HIGH (ref 65–99)
Potassium: 3.5 mmol/L (ref 3.5–5.1)
Sodium: 137 mmol/L (ref 135–145)
Total Bilirubin: 0.8 mg/dL (ref 0.3–1.2)
Total Protein: 6.6 g/dL (ref 6.5–8.1)

## 2017-03-12 LAB — CBC
HCT: 37.9 % (ref 36.0–46.0)
Hemoglobin: 12.3 g/dL (ref 12.0–15.0)
MCH: 28.5 pg (ref 26.0–34.0)
MCHC: 32.5 g/dL (ref 30.0–36.0)
MCV: 87.9 fL (ref 78.0–100.0)
Platelets: 314 10*3/uL (ref 150–400)
RBC: 4.31 MIL/uL (ref 3.87–5.11)
RDW: 13.9 % (ref 11.5–15.5)
WBC: 12.2 10*3/uL — ABNORMAL HIGH (ref 4.0–10.5)

## 2017-03-12 LAB — GLUCOSE, CAPILLARY
Glucose-Capillary: 134 mg/dL — ABNORMAL HIGH (ref 65–99)
Glucose-Capillary: 121 mg/dL — ABNORMAL HIGH (ref 65–99)
Glucose-Capillary: 124 mg/dL — ABNORMAL HIGH (ref 65–99)
Glucose-Capillary: 81 mg/dL (ref 65–99)
Glucose-Capillary: 82 mg/dL (ref 65–99)

## 2017-03-12 MED ORDER — TECHNETIUM TC 99M MEBROFENIN IV KIT
5.1000 | PACK | Freq: Once | INTRAVENOUS | Status: AC | PRN
  Administered 2017-03-12: 5.1 via INTRAVENOUS

## 2017-03-12 MED ORDER — POLYETHYLENE GLYCOL 3350 17 G PO PACK: 17.0000 g | PACK | Freq: Every day | ORAL | Status: DC

## 2017-03-12 MED ORDER — CIPROFLOXACIN HCL 500 MG PO TABS
500.0000 mg | ORAL_TABLET | Freq: Two times a day (BID) | ORAL | Status: DC
  Administered 2017-03-12 – 2017-03-15 (×7): 500 mg via ORAL
  Filled 2017-03-12 (×7): qty 1

## 2017-03-12 MED ORDER — DOCUSATE SODIUM 100 MG PO CAPS
100.0000 mg | ORAL_CAPSULE | Freq: Two times a day (BID) | ORAL | Status: DC
  Administered 2017-03-12 – 2017-03-14 (×4): 100 mg via ORAL
  Filled 2017-03-12 (×4): qty 1

## 2017-03-12 NOTE — Progress Notes (Signed)
CSW consult-"Patient concern about loosing Job"  CSW inquired about patient concerns. CSW encouraged call her supervisior to determine protocol when hospitalized. She reports she will follow up with her supervisor to determine protocol for FMLA. She reports having support at home with her spouse and other relatives.  No other CSW needs identified. CSW signing off.   Kathrin Greathouse, Latanya Presser, MSW Clinical Social Worker  [phone] 03/12/2017  11:52 AM

## 2017-03-12 NOTE — H&P (View-Only) (Signed)
Reason for Consult: Bile Duct Leak Referring Physician: CCS  Alonna Buckler HPI: This is a 57 year old female s/p lap chole for acute cholecystitis.  She was complaining about epigastric/RUQ pain for some time, but she started it was improved with PO intake.  A RUQ U/S was performed 01/2017 and there was evidence of gallstones.  The plan was to have her follow up in the office tomorrow, but she was admitted for her cholecystitis.  She was noted to have a accessory duct of Luska and she continued to have biliary drainage with the external drain.  A HIDA scan today revealed a large pool of bile.  Past Medical History:  Diagnosis Date  . Atypical chest pain   . Barrett's esophagus    history of.  Normal EGD 2011, 2014  . Cervical lymphadenopathy    left  . Cervical strain   . Constipation   . GERD (gastroesophageal reflux disease)   . Hypercholesteremia   . Hyperglycemia    a1c 6.3 7/12  . Hyperplastic colonic polyp    history of   . Hypertension   . Migraine   . Osteopenia   . Ulcer 2010  . Urinary incontinence   . Vitamin D deficiency     Past Surgical History:  Procedure Laterality Date  . ABDOMINAL HYSTERECTOMY  2009   Dr. Bethann Goo - benign disease  . BLADDER SURGERY     bladder mesh surgery 2010- Dr. Rhodia Albright  . BREAST CYST ASPIRATION    . COLONOSCOPY W/ POLYPECTOMY  2010  . LAPAROSCOPIC CHOLECYSTECTOMY SINGLE PORT N/A 03/09/2017   Procedure: LAPAROSCOPIC CHOLECYSTECTOMY WITH INTRAOPERATIVE CHOLANGIOGRAM, CLOSURE OF ABERRANT DUCT OF LUSCHKA;  Surgeon: Michael Boston, MD;  Location: [facility];  Service: General;  Laterality: N/A;    Family History  Problem Relation Age of Onset  . Colon cancer Father   . Hypertension Father   . Heart attack Father 21  . Cancer Father        prostate CA  . Pulmonary embolism Mother        deceased  . Hypertension Mother   . Diabetes Sister   . Hypertension Brother   . Hypertension Sister   . Cancer Paternal Grandmother        oral  .  Thyroid disease Sister        1/2 sister thyroid problem  . Healthy Daughter   . Heart attack Brother 20  . Breast cancer Cousin     Social History:  reports that  has never smoked. she has never used smokeless tobacco. She reports that she does not drink alcohol or use drugs.  Allergies:  Allergies  Allergen Reactions  . Codeine     REACTION: headache, visual disturbance  . Morphine And Related   . Rocephin [Ceftriaxone Sodium In Dextrose] Rash    Medications:  Scheduled: . acetaminophen  1,000 mg Oral TID  . aspirin EC  81 mg Oral Daily  . atorvastatin  40 mg Oral Daily  . ciprofloxacin  500 mg Oral BID  . docusate sodium  100 mg Oral BID  . enoxaparin (LOVENOX) injection  40 mg Subcutaneous Q24H  . gabapentin  300 mg Oral BID  . insulin aspart  0-15 Units Subcutaneous TID WC  . insulin aspart  0-5 Units Subcutaneous QHS  . lip balm  1 application Topical BID  . losartan  25 mg Oral Daily  . metFORMIN  500 mg Oral Q breakfast  . pantoprazole  40 mg  Oral Daily  . polyethylene glycol  17 g Oral Daily  . psyllium  1 packet Oral Daily   Continuous: . lactated ringers 75 mL/hr at 03/11/17 2240  . methocarbamol (ROBAXIN)  IV      Results for orders placed or performed during the hospital encounter of 03/08/17 (from the past 24 hour(s))  Glucose, capillary     Status: Abnormal   Collection Time: 03/11/17  5:03 PM  Result Value Ref Range   Glucose-Capillary 119 (H) 65 - 99 mg/dL  Glucose, capillary     Status: Abnormal   Collection Time: 03/11/17  9:17 PM  Result Value Ref Range   Glucose-Capillary 114 (H) 65 - 99 mg/dL  CBC     Status: Abnormal   Collection Time: 03/12/17  5:34 AM  Result Value Ref Range   WBC 12.2 (H) 4.0 - 10.5 K/uL   RBC 4.31 3.87 - 5.11 MIL/uL   Hemoglobin 12.3 12.0 - 15.0 g/dL   HCT 37.9 36.0 - 46.0 %   MCV 87.9 78.0 - 100.0 fL   MCH 28.5 26.0 - 34.0 pg   MCHC 32.5 30.0 - 36.0 g/dL   RDW 13.9 11.5 - 15.5 %   Platelets 314 150 - 400 K/uL   Comprehensive metabolic panel     Status: Abnormal   Collection Time: 03/12/17  5:34 AM  Result Value Ref Range   Sodium 137 135 - 145 mmol/L   Potassium 3.5 3.5 - 5.1 mmol/L   Chloride 104 101 - 111 mmol/L   CO2 26 22 - 32 mmol/L   Glucose, Bld 109 (H) 65 - 99 mg/dL   BUN 12 6 - 20 mg/dL   Creatinine, Ser 0.63 0.44 - 1.00 mg/dL   Calcium 8.5 (L) 8.9 - 10.3 mg/dL   Total Protein 6.6 6.5 - 8.1 g/dL   Albumin 3.6 3.5 - 5.0 g/dL   AST 41 15 - 41 U/L   ALT 96 (H) 14 - 54 U/L   Alkaline Phosphatase 95 38 - 126 U/L   Total Bilirubin 0.8 0.3 - 1.2 mg/dL   GFR calc non Af Amer >60 >60 mL/min   GFR calc Af Amer >60 >60 mL/min   Anion gap 7 5 - 15  Glucose, capillary     Status: Abnormal   Collection Time: 03/12/17  6:34 AM  Result Value Ref Range   Glucose-Capillary 134 (H) 65 - 99 mg/dL  Glucose, capillary     Status: Abnormal   Collection Time: 03/12/17  7:54 AM  Result Value Ref Range   Glucose-Capillary 121 (H) 65 - 99 mg/dL  Glucose, capillary     Status: Abnormal   Collection Time: 03/12/17 11:18 AM  Result Value Ref Range   Glucose-Capillary 124 (H) 65 - 99 mg/dL     Nm Hepatobiliary Liver Func  Result Date: 03/12/2017 CLINICAL DATA:  Recent laparoscopic cholecystectomy. Postop bile leak. EXAM: NUCLEAR MEDICINE HEPATOBILIARY IMAGING TECHNIQUE: Sequential images of the abdomen were obtained out to 60 minutes following intravenous administration of radiopharmaceutical. RADIOPHARMACEUTICALS:  5.1 mCi Tc-64m Choletec IV COMPARISON:  Intraoperative cholangiogram on 03/09/2017 FINDINGS: Prompt uptake and biliary excretion of activity by the liver is seen. No gallbladder activity, consistent with prior cholecystectomy. Leak of bowel activity is seen into Morison's pouch, which extends inferiorly in the right paracolic gutter and superiorly in the right perihepatic space. No biliary activity is seen within the common bile duct or entering the small bowel. IMPRESSION: Large postop bile  leak extending  in the right paracolic gutter and right perihepatic space. No radiopharmaceutical activity seen within the common bile duct or small bowel. This is likely due to prominent bile leak, with common bile duct obstruction considered unlikely given patency of common bile duct seen on recent intraoperative cholangiogram. Electronically Signed   By: Earle Gell M.D.   On: 03/12/2017 14:49    ROS:  As stated above in the HPI otherwise negative.  Blood pressure 130/80, pulse (!) 105, temperature 98.8 F (37.1 C), temperature source Oral, resp. rate 19, height 5' (1.524 m), weight 68 kg (150 lb), SpO2 99 %.    PE: Gen: NAD, Alert and Oriented HEENT:  [place]/AT, EOMI Neck: Supple, no LAD Lungs: CTA Bilaterally CV: RRR without M/G/R ABM: Soft, NTND, +BS Ext: No C/C/E  Assessment/Plan: 1) Bile duct leak. 2) S/p lap chole.   I will schedule the patient for an ERCP at [facility].  She will be transported by [facility] as a result of endoscopy scheduling, my personal scheduling for procedures, and her clinical need for the ERCP in a timely manner.  Plan: 1) ERCP tomorrow at [facility].    Kennie Karapetian D 03/12/2017, 4:02 PM

## 2017-03-12 NOTE — Progress Notes (Signed)
[facility] Progress Note  3 Days Post-Op  Subjective: CC-  No complaints today. Patient states that she is still sore, but doing well. Some mild nausea with PO intake, no emesis. She was started back on a heart healthy diet this morning. Passing small amount of flatus, burping more. No BM. She has been ambulating frequently in the halls. Feels more rumbling in her abdomen today.  Objective: Vital signs in last 24 hours: Temp:  [96 F (35.6 C)-99.7 F (37.6 C)] 96 F (35.6 C) (01/22 0600) Pulse Rate:  [88-113] 94 (01/22 0600) Resp:  [18] 18 (01/22 0600) BP: (110-164)/(64-93) 110/64 (01/22 0600) SpO2:  [94 %-98 %] 98 % (01/22 0600) Last BM Date: 03/07/17  Intake/Output from previous day: 01/21 0701 - 01/22 0700 In: 3280 [P.O.:1080; I.V.:1300; IV Piggyback:900] Out: 427 [Drains:427] Intake/Output this shift: No intake/output data recorded.  PE: Gen:  Alert, NAD, pleasant HEENT: EOM's intact, pupils equal and round Card:  RRR, no M/G/R heard Pulm:  CTAB, no W/R/R, effort normal Abd: Soft, mild distension, appropriately tender, +BS, lap incisions C/D/I, drain with bilious output Psych: A&Ox3  Skin: no rashes noted, warm and dry  Lab Results:  Recent Labs    03/11/17 0434 03/12/17 0534  WBC 12.1* 12.2*  HGB 12.7 12.3  HCT 39.0 37.9  PLT 347 314   BMET Recent Labs    03/10/17 0425 03/11/17 0434 03/12/17 0534  NA 137  --  137  K 4.1 4.1 3.5  CL 103  --  104  CO2 24  --  26  GLUCOSE 146*  --  109*  BUN 11  --  12  CREATININE 0.56 0.55 0.63  CALCIUM 9.1  --  8.5*   PT/INR No results for input(s): LABPROT, INR in the last 72 hours. CMP     Component Value Date/Time   NA 137 03/12/2017 0534   K 3.5 03/12/2017 0534   CL 104 03/12/2017 0534   CO2 26 03/12/2017 0534   GLUCOSE 109 (H) 03/12/2017 0534   BUN 12 03/12/2017 0534   BUN 12 12/07/2015   CREATININE 0.63 03/12/2017 0534   CREATININE 0.65 12/28/2016 1027   CALCIUM 8.5 (L) 03/12/2017  0534   PROT 6.6 03/12/2017 0534   ALBUMIN 3.6 03/12/2017 0534   AST 41 03/12/2017 0534   ALT 96 (H) 03/12/2017 0534   ALKPHOS 95 03/12/2017 0534   BILITOT 0.8 03/12/2017 0534   GFRNONAA >60 03/12/2017 0534   GFRNONAA 98 12/28/2016 1027   GFRAA >60 03/12/2017 0534   GFRAA 113 12/28/2016 1027   Lipase     Component Value Date/Time   LIPASE 31 03/08/2017 1954       Studies/Results: No results found.  Anti-infectives: Anti-infectives (From admission, onward)   Start     Dose/Rate Route Frequency Ordered Stop   03/10/17 1200  metroNIDAZOLE (FLAGYL) IVPB 500 mg     500 mg 100 mL/hr over 60 Minutes Intravenous Every 6 hours 03/10/17 0851 03/11/17 1346   03/10/17 1000  ciprofloxacin (CIPRO) IVPB 400 mg     400 mg 200 mL/hr over 60 Minutes Intravenous Every 12 hours 03/10/17 0851 03/12/17 2159   03/09/17 0815  clindamycin (CLEOCIN) IVPB 600 mg  Status:  Discontinued     600 mg 100 mL/hr over 30 Minutes Intravenous  Once 03/09/17 0813 03/10/17 0851   03/09/17 0815  gentamicin (GARAMYCIN) 340 mg in dextrose 5 % 50 mL IVPB     5 mg/kg  68 kg 117  mL/hr over 30 Minutes Intravenous  Once 03/09/17 0813 03/09/17 0921   03/09/17 0731  dextrose 5 % with cefTRIAXone (ROCEPHIN) ADS Med  Status:  Discontinued    Comments:  Octavio Graves   : cabinet override      03/09/17 0731 03/09/17 0817   03/09/17 0200  cefTRIAXone (ROCEPHIN) 2 g in dextrose 5 % 50 mL IVPB  Status:  Discontinued     2 g 100 mL/hr over 30 Minutes Intravenous On call to O.R. 03/09/17 0128 03/09/17 0307   03/09/17 0200  cefTRIAXone (ROCEPHIN) 2 g in dextrose 5 % 50 mL IVPB  Status:  Discontinued     2 g 100 mL/hr over 30 Minutes Intravenous Every 24 hours 03/09/17 0157 03/10/17 0851   03/09/17 0145  metroNIDAZOLE (FLAGYL) IVPB 500 mg  Status:  Discontinued     500 mg 100 mL/hr over 60 Minutes Intravenous Every 8 hours 03/09/17 0128 03/10/17 0851       Assessment/Plan HTN/tachycardia - prn lopressor,  improving Hyperglycemia - metformin +SSI GERD - PPI  Acute on Chronic Calculus Cholecystitis - s/p lap chole with IOC 1/19 Dr. 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 Johney Maine  Plan - drain output increased and still bile tinged, but LFTs nearly normal; discussed with MD, will order HIDA to evaluate. Change cipro to oral. Add bowel regimen.    LOS: 3 days    Wellington Hampshire , [facility] 03/12/2017, 9:37 AM Pager: [phone] Consults: [phone] Mon-Fri 7:00 am-4:30 pm Sat-Sun 7:00 am-11:30 am

## 2017-03-12 NOTE — Consult Note (Signed)
   [facility] CM Inpatient Consult   03/12/2017  AALEIGHA BOZZA 23-Feb-1960 [id number]    Came to visit Mrs. Burchfield at bedside on behalf of [employer] program for [facility] employees with [facility] insurance.  Discussed post hospital discharge call. She is agreeable. Confirms best contact information as [phone] and [phone].  Family states they have already called [employer]ML[employer] on patient's behalf. No further telephone numbers needed at this time.  Discussed the [place] program for DM.   Appreciative of visit. Provided 24-hr nurse advice line magnet.   Marthenia Rolling, MSN-Ed, RN,BSN [facility] Liaison [phone]

## 2017-03-12 NOTE — Consult Note (Signed)
Reason for Consult: Bile Duct Leak Referring Physician: CCS  Alonna Buckler HPI: This is a 57 year old female s/p lap chole for acute cholecystitis.  She was complaining about epigastric/RUQ pain for some time, but she started it was improved with PO intake.  A RUQ U/S was performed 01/2017 and there was evidence of gallstones.  The plan was to have her follow up in the office tomorrow, but she was admitted for her cholecystitis.  She was noted to have a accessory duct of Luska and she continued to have biliary drainage with the external drain.  A HIDA scan today revealed a large pool of bile.  Past Medical History:  Diagnosis Date  . Atypical chest pain   . Barrett's esophagus    history of.  Normal EGD 2011, 2014  . Cervical lymphadenopathy    left  . Cervical strain   . Constipation   . GERD (gastroesophageal reflux disease)   . Hypercholesteremia   . Hyperglycemia    a1c 6.3 7/12  . Hyperplastic colonic polyp    history of   . Hypertension   . Migraine   . Osteopenia   . Ulcer 2010  . Urinary incontinence   . Vitamin D deficiency     Past Surgical History:  Procedure Laterality Date  . ABDOMINAL HYSTERECTOMY  2009   Dr. Bethann Goo - benign disease  . BLADDER SURGERY     bladder mesh surgery 2010- Dr. Rhodia Albright  . BREAST CYST ASPIRATION    . COLONOSCOPY W/ POLYPECTOMY  2010  . LAPAROSCOPIC CHOLECYSTECTOMY SINGLE PORT N/A 03/09/2017   Procedure: LAPAROSCOPIC CHOLECYSTECTOMY WITH INTRAOPERATIVE CHOLANGIOGRAM, CLOSURE OF ABERRANT DUCT OF LUSCHKA;  Surgeon: Michael Boston, MD;  Location: [facility];  Service: General;  Laterality: N/A;    Family History  Problem Relation Age of Onset  . Colon cancer Father   . Hypertension Father   . Heart attack Father 23  . Cancer Father        prostate CA  . Pulmonary embolism Mother        deceased  . Hypertension Mother   . Diabetes Sister   . Hypertension Brother   . Hypertension Sister   . Cancer Paternal Grandmother        oral  .  Thyroid disease Sister        1/2 sister thyroid problem  . Healthy Daughter   . Heart attack Brother 68  . Breast cancer Cousin     Social History:  reports that  has never smoked. she has never used smokeless tobacco. She reports that she does not drink alcohol or use drugs.  Allergies:  Allergies  Allergen Reactions  . Codeine     REACTION: headache, visual disturbance  . Morphine And Related   . Rocephin [Ceftriaxone Sodium In Dextrose] Rash    Medications:  Scheduled: . acetaminophen  1,000 mg Oral TID  . aspirin EC  81 mg Oral Daily  . atorvastatin  40 mg Oral Daily  . ciprofloxacin  500 mg Oral BID  . docusate sodium  100 mg Oral BID  . enoxaparin (LOVENOX) injection  40 mg Subcutaneous Q24H  . gabapentin  300 mg Oral BID  . insulin aspart  0-15 Units Subcutaneous TID WC  . insulin aspart  0-5 Units Subcutaneous QHS  . lip balm  1 application Topical BID  . losartan  25 mg Oral Daily  . metFORMIN  500 mg Oral Q breakfast  . pantoprazole  40 mg  Oral Daily  . polyethylene glycol  17 g Oral Daily  . psyllium  1 packet Oral Daily   Continuous: . lactated ringers 75 mL/hr at 03/11/17 2240  . methocarbamol (ROBAXIN)  IV      Results for orders placed or performed during the hospital encounter of 03/08/17 (from the past 24 hour(s))  Glucose, capillary     Status: Abnormal   Collection Time: 03/11/17  5:03 PM  Result Value Ref Range   Glucose-Capillary 119 (H) 65 - 99 mg/dL  Glucose, capillary     Status: Abnormal   Collection Time: 03/11/17  9:17 PM  Result Value Ref Range   Glucose-Capillary 114 (H) 65 - 99 mg/dL  CBC     Status: Abnormal   Collection Time: 03/12/17  5:34 AM  Result Value Ref Range   WBC 12.2 (H) 4.0 - 10.5 K/uL   RBC 4.31 3.87 - 5.11 MIL/uL   Hemoglobin 12.3 12.0 - 15.0 g/dL   HCT 37.9 36.0 - 46.0 %   MCV 87.9 78.0 - 100.0 fL   MCH 28.5 26.0 - 34.0 pg   MCHC 32.5 30.0 - 36.0 g/dL   RDW 13.9 11.5 - 15.5 %   Platelets 314 150 - 400 K/uL   Comprehensive metabolic panel     Status: Abnormal   Collection Time: 03/12/17  5:34 AM  Result Value Ref Range   Sodium 137 135 - 145 mmol/L   Potassium 3.5 3.5 - 5.1 mmol/L   Chloride 104 101 - 111 mmol/L   CO2 26 22 - 32 mmol/L   Glucose, Bld 109 (H) 65 - 99 mg/dL   BUN 12 6 - 20 mg/dL   Creatinine, Ser 0.63 0.44 - 1.00 mg/dL   Calcium 8.5 (L) 8.9 - 10.3 mg/dL   Total Protein 6.6 6.5 - 8.1 g/dL   Albumin 3.6 3.5 - 5.0 g/dL   AST 41 15 - 41 U/L   ALT 96 (H) 14 - 54 U/L   Alkaline Phosphatase 95 38 - 126 U/L   Total Bilirubin 0.8 0.3 - 1.2 mg/dL   GFR calc non Af Amer >60 >60 mL/min   GFR calc Af Amer >60 >60 mL/min   Anion gap 7 5 - 15  Glucose, capillary     Status: Abnormal   Collection Time: 03/12/17  6:34 AM  Result Value Ref Range   Glucose-Capillary 134 (H) 65 - 99 mg/dL  Glucose, capillary     Status: Abnormal   Collection Time: 03/12/17  7:54 AM  Result Value Ref Range   Glucose-Capillary 121 (H) 65 - 99 mg/dL  Glucose, capillary     Status: Abnormal   Collection Time: 03/12/17 11:18 AM  Result Value Ref Range   Glucose-Capillary 124 (H) 65 - 99 mg/dL     Nm Hepatobiliary Liver Func  Result Date: 03/12/2017 CLINICAL DATA:  Recent laparoscopic cholecystectomy. Postop bile leak. EXAM: NUCLEAR MEDICINE HEPATOBILIARY IMAGING TECHNIQUE: Sequential images of the abdomen were obtained out to 60 minutes following intravenous administration of radiopharmaceutical. RADIOPHARMACEUTICALS:  5.1 mCi Tc-18m Choletec IV COMPARISON:  Intraoperative cholangiogram on 03/09/2017 FINDINGS: Prompt uptake and biliary excretion of activity by the liver is seen. No gallbladder activity, consistent with prior cholecystectomy. Leak of bowel activity is seen into Morison's pouch, which extends inferiorly in the right paracolic gutter and superiorly in the right perihepatic space. No biliary activity is seen within the common bile duct or entering the small bowel. IMPRESSION: Large postop bile  leak extending  in the right paracolic gutter and right perihepatic space. No radiopharmaceutical activity seen within the common bile duct or small bowel. This is likely due to prominent bile leak, with common bile duct obstruction considered unlikely given patency of common bile duct seen on recent intraoperative cholangiogram. Electronically Signed   By: Earle Gell M.D.   On: 03/12/2017 14:49    ROS:  As stated above in the HPI otherwise negative.  Blood pressure 130/80, pulse (!) 105, temperature 98.8 F (37.1 C), temperature source Oral, resp. rate 19, height 5' (1.524 m), weight 68 kg (150 lb), SpO2 99 %.    PE: Gen: NAD, Alert and Oriented HEENT:  [place]/AT, EOMI Neck: Supple, no LAD Lungs: CTA Bilaterally CV: RRR without M/G/R ABM: Soft, NTND, +BS Ext: No C/C/E  Assessment/Plan: 1) Bile duct leak. 2) S/p lap chole.   I will schedule the patient for an ERCP at [facility].  She will be transported by [facility] as a result of endoscopy scheduling, my personal scheduling for procedures, and her clinical need for the ERCP in a timely manner.  Plan: 1) ERCP tomorrow at [facility].    Butler Vegh D 03/12/2017, 4:02 PM

## 2017-03-13 ENCOUNTER — Inpatient Hospital Stay (HOSPITAL_COMMUNITY): Payer: 59 | Admitting: Anesthesiology

## 2017-03-13 ENCOUNTER — Encounter (HOSPITAL_COMMUNITY): Payer: Self-pay | Admitting: Anesthesiology

## 2017-03-13 ENCOUNTER — Inpatient Hospital Stay (HOSPITAL_COMMUNITY): Payer: 59

## 2017-03-13 ENCOUNTER — Encounter (HOSPITAL_COMMUNITY): Admission: EM | Disposition: A | Discharge status: Home / Self Care | Payer: Self-pay | Source: Home / Self Care

## 2017-03-13 HISTORY — PX: ERCP: SHX5425

## 2017-03-13 LAB — GLUCOSE, CAPILLARY
Glucose-Capillary: 73 mg/dL (ref 65–99)
Glucose-Capillary: 67 mg/dL (ref 65–99)
Glucose-Capillary: 139 mg/dL — ABNORMAL HIGH (ref 65–99)
Glucose-Capillary: 126 mg/dL — ABNORMAL HIGH (ref 65–99)
Glucose-Capillary: 107 mg/dL — ABNORMAL HIGH (ref 65–99)

## 2017-03-13 SURGERY — ERCP, WITH INTERVENTION IF INDICATED
Anesthesia: General

## 2017-03-13 MED ORDER — DEXTROSE 50 % IV SOLN
25.0000 mL | Freq: Once | INTRAVENOUS | Status: AC
  Administered 2017-03-13: 25 mL via INTRAVENOUS

## 2017-03-13 MED ORDER — CIPROFLOXACIN IN D5W 400 MG/200ML IV SOLN: 400.0000 mg | Freq: Once | INTRAVENOUS | Status: DC

## 2017-03-13 MED ORDER — ROCURONIUM BROMIDE 100 MG/10ML IV SOLN
INTRAVENOUS | Status: DC | PRN
  Administered 2017-03-13: 50 mg via INTRAVENOUS

## 2017-03-13 MED ORDER — LABETALOL HCL 5 MG/ML IV SOLN
10.0000 mg | Freq: Once | INTRAVENOUS | Status: AC
  Administered 2017-03-13: 10 mg via INTRAVENOUS

## 2017-03-13 MED ORDER — GLUCAGON HCL RDNA (DIAGNOSTIC) 1 MG IJ SOLR
INTRAMUSCULAR | Status: DC | PRN
  Administered 2017-03-13: .5 mg via INTRAVENOUS

## 2017-03-13 MED ORDER — FENTANYL CITRATE (PF) 100 MCG/2ML IJ SOLN
INTRAMUSCULAR | Status: AC
  Filled 2017-03-13: qty 4

## 2017-03-13 MED ORDER — ESMOLOL HCL 100 MG/10ML IV SOLN
INTRAVENOUS | Status: DC | PRN
  Administered 2017-03-13: 20 mg via INTRAVENOUS

## 2017-03-13 MED ORDER — DEXTROSE 50 % IV SOLN
INTRAVENOUS | Status: AC
  Filled 2017-03-13: qty 50

## 2017-03-13 MED ORDER — GLUCAGON HCL RDNA (DIAGNOSTIC) 1 MG IJ SOLR
INTRAMUSCULAR | Status: AC
  Filled 2017-03-13: qty 1

## 2017-03-13 MED ORDER — INDOMETHACIN 50 MG RE SUPP: 100.0000 mg | Freq: Once | RECTAL | Status: DC

## 2017-03-13 MED ORDER — SCOPOLAMINE 1 MG/3DAYS TD PT72
1.0000 | MEDICATED_PATCH | TRANSDERMAL | Status: DC
  Administered 2017-03-13: 1.5 mg via TRANSDERMAL

## 2017-03-13 MED ORDER — LABETALOL HCL 5 MG/ML IV SOLN
INTRAVENOUS | Status: AC
  Filled 2017-03-13: qty 4

## 2017-03-13 MED ORDER — ONDANSETRON HCL 4 MG/2ML IJ SOLN
INTRAMUSCULAR | Status: DC | PRN
  Administered 2017-03-13: 4 mg via INTRAVENOUS

## 2017-03-13 MED ORDER — NEOSTIGMINE METHYLSULFATE 10 MG/10ML IV SOLN
INTRAVENOUS | Status: DC | PRN
  Administered 2017-03-13: 4 mg via INTRAVENOUS

## 2017-03-13 MED ORDER — GLYCOPYRROLATE 0.2 MG/ML IJ SOLN
INTRAMUSCULAR | Status: DC | PRN
  Administered 2017-03-13: 0.6 mg via INTRAVENOUS

## 2017-03-13 MED ORDER — INDOMETHACIN 50 MG RE SUPP
RECTAL | Status: DC | PRN
  Administered 2017-03-13 (×2): 50 mg via RECTAL

## 2017-03-13 MED ORDER — SODIUM CHLORIDE 0.9 % IV SOLN: INTRAVENOUS | Status: DC

## 2017-03-13 MED ORDER — IOPAMIDOL (ISOVUE-300) INJECTION 61%
INTRAVENOUS | Status: AC
  Filled 2017-03-13: qty 100

## 2017-03-13 MED ORDER — FENTANYL CITRATE (PF) 100 MCG/2ML IJ SOLN
INTRAMUSCULAR | Status: DC | PRN
  Administered 2017-03-13 (×2): 50 ug via INTRAVENOUS
  Administered 2017-03-13: 100 ug via INTRAVENOUS

## 2017-03-13 MED ORDER — PROPOFOL 10 MG/ML IV BOLUS
INTRAVENOUS | Status: DC | PRN
  Administered 2017-03-13: 160 mg via INTRAVENOUS
  Administered 2017-03-13: 40 mg via INTRAVENOUS

## 2017-03-13 MED ORDER — METOCLOPRAMIDE HCL 5 MG/ML IJ SOLN
5.0000 mg | Freq: Four times a day (QID) | INTRAMUSCULAR | Status: DC | PRN
Start: 2017-03-13 — End: 2017-03-14

## 2017-03-13 MED ORDER — DEXAMETHASONE SODIUM PHOSPHATE 10 MG/ML IJ SOLN
INTRAMUSCULAR | Status: DC | PRN
  Administered 2017-03-13: 10 mg via INTRAVENOUS

## 2017-03-13 MED ORDER — CIPROFLOXACIN IN D5W 400 MG/200ML IV SOLN
INTRAVENOUS | Status: AC
  Filled 2017-03-13: qty 200

## 2017-03-13 MED ORDER — INDOMETHACIN 50 MG RE SUPP
RECTAL | Status: AC
  Filled 2017-03-13: qty 1

## 2017-03-13 MED ORDER — LIDOCAINE HCL (CARDIAC) 20 MG/ML IV SOLN
INTRAVENOUS | Status: DC | PRN
  Administered 2017-03-13: 80 mg via INTRAVENOUS

## 2017-03-13 NOTE — Anesthesia Preprocedure Evaluation (Signed)
Anesthesia Evaluation  Patient identified by MRN, date of birth, ID band Patient awake    Reviewed: Allergy & Precautions, NPO status , Patient's Chart, lab work & pertinent test results  Airway Mallampati: II  TM Distance: >3 FB Neck ROM: Full    Dental no notable dental hx. (+) Dental Advisory Given, Chipped, Missing   Pulmonary neg pulmonary ROS,    Pulmonary exam normal breath sounds clear to auscultation       Cardiovascular hypertension, Normal cardiovascular exam Rhythm:Regular Rate:Normal     Neuro/Psych  Headaches,  Neuromuscular disease negative neurological ROS  negative psych ROS   GI/Hepatic Neg liver ROS, GERD  ,  Endo/Other  negative endocrine ROSdiabetes, Type 2, Oral Hypoglycemic Agents  Renal/GU negative Renal ROS     Musculoskeletal negative musculoskeletal ROS (+)   Abdominal   Peds  Hematology negative hematology ROS (+)   Anesthesia Other Findings   Reproductive/Obstetrics negative OB ROS                             Anesthesia Physical  Anesthesia Plan  ASA: II  Anesthesia Plan: General   Post-op Pain Management:    Induction: Intravenous  PONV Risk Score and Plan: 3 and Ondansetron, Dexamethasone, Midazolam and Treatment may vary due to age or medical condition  Airway Management Planned: Oral ETT  Additional Equipment:   Intra-op Plan:   Post-operative Plan: Extubation in OR  Informed Consent: I have reviewed the patients History and Physical, chart, labs and discussed the procedure including the risks, benefits and alternatives for the proposed anesthesia with the patient or authorized representative who has indicated his/her understanding and acceptance.   Dental advisory given  Plan Discussed with: CRNA  Anesthesia Plan Comments:         Anesthesia Quick Evaluation

## 2017-03-13 NOTE — Progress Notes (Signed)
Pt is tachycardic. HR in 120s. BP elevated as well. Notified Dr. Marcie Bal and received orders for 10mg  of Labetalol IV. Will continue to monitor.

## 2017-03-13 NOTE — Op Note (Signed)
[facility] Patient Name: Whitney Neal Procedure Date : 03/13/2017 MRN: [id number] Attending MD: Carol Ada , MD Date of Birth: [birth year] CSN: [id number] Age: 57 Admit Type: Inpatient Procedure:                ERCP Indications:              Treatment of bile leak Providers:                Carol Ada, MD, Burtis Junes, RN, Tinnie Gens,                            Technician, Gershon Crane CRNA, CRNA Referring MD:              Medicines:                General Anesthesia Complications:            No immediate complications. Estimated Blood Loss:     Estimated blood loss: none. Procedure:                Pre-Anesthesia Assessment:                           - Prior to the procedure, a History and Physical                            was performed, and patient medications and                            allergies were reviewed. The patient's tolerance of                            previous anesthesia was also reviewed. The risks                            and benefits of the procedure and the sedation                            options and risks were discussed with the patient.                            All questions were answered, and informed consent                            was obtained. Prior Anticoagulants: The patient has                            taken Lovenox (enoxaparin), last dose was 1 day                            prior to procedure. ASA Grade Assessment: II - A                            patient with mild systemic disease. After reviewing  the risks and benefits, the patient was deemed in                            satisfactory condition to undergo the procedure.                           - Sedation was administered by an anesthesia                            professional. General anesthesia was attained.                           After obtaining informed consent, the scope was                            passed under direct vision.  Throughout the                            procedure, the patient's blood pressure, pulse, and                            oxygen saturations were monitored continuously. The                            [id number] scope was introduced through the                            mouth, and used to inject contrast into and to                            cannulate the ventral pancreatic duct. The ERCP was                            performed with difficulty. The patient tolerated                            the procedure well. Findings:      The major papilla was normal. A 0.035 inch straight standard wire was       passed into the ventral pancreatic duct. A 7 mm ventral pancreatic       sphincterotomy was made with a monofilament traction (standard)       sphincterotome using ERBE electrocautery. There was no       post-sphincterotomy bleeding. Indomethacin 100 mg was given via       suppository to decrease the risk of post-ERCP pancreatitis (PEP).      The ampulla was identified and there was no drainage of any bile. A       ventral duct of the pancreas was cannulated multiple times. A 7 mm       sphincterotomy was created to unroof the ampulla to help expose the       distal CBD orifice. After multiple attempts in different positions the       CBD was not able to be cannulated. The dorsal PD was not identified or       cannulated. The procedure was concluded.  Impression:               - The major papilla appeared normal.                           - A pancreatic sphincterotomy was performed.                           - Indomethacin given to decrease risk of post-ERCP                            pancreatitis. Recommendation:           - Return patient to hospital ward for ongoing care.                           - Clear liquid diet.                           - Continue present medications. Procedure Code(s):        --- Professional ---                           [phone], Endoscopic retrograde                             cholangiopancreatography (ERCP); with                            sphincterotomy/papillotomy Diagnosis Code(s):        --- Professional ---                           K83.8, Other specified diseases of biliary tract CPT copyright 2016 American Medical Association. All rights reserved. The codes documented in this report are preliminary and upon coder review may  be revised to meet current compliance requirements. Carol Ada, MD Carol Ada, MD 03/13/2017 2:05:39 PM This report has been signed electronically. Number of Addenda: 0

## 2017-03-13 NOTE — Transfer of Care (Signed)
Immediate Anesthesia Transfer of Care Note  Patient: Whitney Neal  Procedure(s) Performed: ENDOSCOPIC RETROGRADE CHOLANGIOPANCREATOGRAPHY (ERCP) (N/A ) ERCP ABORTED CASE  Patient Location: Endoscopy Unit  Anesthesia Type:General  Level of Consciousness: awake, drowsy and patient cooperative  Airway & Oxygen Therapy: Patient Spontanous Breathing and Patient connected to nasal cannula oxygen  Post-op Assessment: Report given to RN and Post -op Vital signs reviewed and stable  Post vital signs: Reviewed and stable  Last Vitals:  Vitals:   03/13/17 1112 03/13/17 1130  BP: (!) 164/88 (!) 169/78  Pulse: 93 100  Resp: 19 18  Temp: 36.6 C   SpO2: 97% 100%    Last Pain:  Vitals:   03/13/17 1112  TempSrc: Oral  PainSc: 7       Patients Stated Pain Goal: 2 (78/67/54 4920)  Complications: No apparent anesthesia complications

## 2017-03-13 NOTE — Interval H&P Note (Signed)
History and Physical Interval Note:  03/13/2017 12:23 PM  Whitney Neal  has presented today for surgery, with the diagnosis of Bile leak  The various methods of treatment have been discussed with the patient and family. After consideration of risks, benefits and other options for treatment, the patient has consented to  Procedure(s): ENDOSCOPIC RETROGRADE CHOLANGIOPANCREATOGRAPHY (ERCP) (N/A) as a surgical intervention .  The patient's history has been reviewed, patient examined, no change in status, stable for surgery.  I have reviewed the patient's chart and labs.  Questions were answered to the patient's satisfaction.     Zayonna Ayuso D

## 2017-03-13 NOTE — Progress Notes (Signed)
[place] [facility] Progress Note  4 Days Post-Op  Subjective: CC-  Patient scheduled for ERCP today at [facility]. Overall doing well. Continues to have some abdominal pain, but it is well controlled. She was tolerating a regular diet yesterday. She does reports some acid reflux.  Passing more flatus but has not had a BM since admission. Denies n/v.  Objective: Vital signs in last 24 hours: Temp:  [98.8 F (37.1 C)-99.2 F (37.3 C)] 98.8 F (37.1 C) (01/23 0551) Pulse Rate:  [87-115] 87 (01/23 0551) Resp:  [17-19] 17 (01/23 0551) BP: (123-133)/(70-88) 133/70 (01/23 0551) SpO2:  [98 %-100 %] 98 % (01/23 0551) Last BM Date: 03/07/17  Intake/Output from previous day: 01/22 0701 - 01/23 0700 In: 2058.8 [P.O.:240; I.V.:1818.8] Out: 400 [Drains:400] Intake/Output this shift: No intake/output data recorded.  PE: Gen:  Alert, NAD, pleasant HEENT: EOM's intact, pupils equal and round Card:  RRR, no M/G/R heard Pulm:  CTAB, no W/R/R, effort normal Abd: Soft, mild distension, appropriately tender, +BS, lap incisions C/D/I, drain with bilious output Psych: A&Ox3  Skin: no rashes noted, warm and dry  Lab Results:  Recent Labs    03/11/17 0434 03/12/17 0534  WBC 12.1* 12.2*  HGB 12.7 12.3  HCT 39.0 37.9  PLT 347 314   BMET Recent Labs    03/11/17 0434 03/12/17 0534  NA  --  137  K 4.1 3.5  CL  --  104  CO2  --  26  GLUCOSE  --  109*  BUN  --  12  CREATININE 0.55 0.63  CALCIUM  --  8.5*   PT/INR No results for input(s): LABPROT, INR in the last 72 hours. CMP     Component Value Date/Time   NA 137 03/12/2017 0534   K 3.5 03/12/2017 0534   CL 104 03/12/2017 0534   CO2 26 03/12/2017 0534   GLUCOSE 109 (H) 03/12/2017 0534   BUN 12 03/12/2017 0534   BUN 12 12/07/2015   CREATININE 0.63 03/12/2017 0534   CREATININE 0.65 12/28/2016 1027   CALCIUM 8.5 (L) 03/12/2017 0534   PROT 6.6 03/12/2017 0534   ALBUMIN 3.6 03/12/2017 0534   AST 41 03/12/2017 0534   ALT 96  (H) 03/12/2017 0534   ALKPHOS 95 03/12/2017 0534   BILITOT 0.8 03/12/2017 0534   GFRNONAA >60 03/12/2017 0534   GFRNONAA 98 12/28/2016 1027   GFRAA >60 03/12/2017 0534   GFRAA 113 12/28/2016 1027   Lipase     Component Value Date/Time   LIPASE 31 03/08/2017 1954       Studies/Results: Nm Hepatobiliary Liver Func  Result Date: 03/12/2017 CLINICAL DATA:  Recent laparoscopic cholecystectomy. Postop bile leak. EXAM: NUCLEAR MEDICINE HEPATOBILIARY IMAGING TECHNIQUE: Sequential images of the abdomen were obtained out to 60 minutes following intravenous administration of radiopharmaceutical. RADIOPHARMACEUTICALS:  5.1 mCi Tc-96m Choletec IV COMPARISON:  Intraoperative cholangiogram on 03/09/2017 FINDINGS: Prompt uptake and biliary excretion of activity by the liver is seen. No gallbladder activity, consistent with prior cholecystectomy. Leak of bowel activity is seen into Morison's pouch, which extends inferiorly in the right paracolic gutter and superiorly in the right perihepatic space. No biliary activity is seen within the common bile duct or entering the small bowel. IMPRESSION: Large postop bile leak extending in the right paracolic gutter and right perihepatic space. No radiopharmaceutical activity seen within the common bile duct or small bowel. This is likely due to prominent bile leak, with common bile duct obstruction considered unlikely given patency of  common bile duct seen on recent intraoperative cholangiogram. Electronically Signed   By: Earle Gell M.D.   On: 03/12/2017 14:49    Anti-infectives: Anti-infectives (From admission, onward)   Start     Dose/Rate Route Frequency Ordered Stop   03/12/17 1100  ciprofloxacin (CIPRO) tablet 500 mg     500 mg Oral 2 times daily 03/12/17 0944     03/10/17 1200  metroNIDAZOLE (FLAGYL) IVPB 500 mg     500 mg 100 mL/hr over 60 Minutes Intravenous Every 6 hours 03/10/17 0851 03/11/17 1346   03/10/17 1000  ciprofloxacin (CIPRO) IVPB 400 mg   Status:  Discontinued     400 mg 200 mL/hr over 60 Minutes Intravenous Every 12 hours 03/10/17 0851 03/12/17 0944   03/09/17 0815  clindamycin (CLEOCIN) IVPB 600 mg  Status:  Discontinued     600 mg 100 mL/hr over 30 Minutes Intravenous  Once 03/09/17 0813 03/10/17 0851   03/09/17 0815  gentamicin (GARAMYCIN) 340 mg in dextrose 5 % 50 mL IVPB     5 mg/kg  68 kg 117 mL/hr over 30 Minutes Intravenous  Once 03/09/17 0813 03/09/17 0921   03/09/17 0731  dextrose 5 % with cefTRIAXone (ROCEPHIN) ADS Med  Status:  Discontinued    Comments:  Octavio Graves   : cabinet override      03/09/17 0731 03/09/17 0817   03/09/17 0200  cefTRIAXone (ROCEPHIN) 2 g in dextrose 5 % 50 mL IVPB  Status:  Discontinued     2 g 100 mL/hr over 30 Minutes Intravenous On call to O.R. 03/09/17 0128 03/09/17 0307   03/09/17 0200  cefTRIAXone (ROCEPHIN) 2 g in dextrose 5 % 50 mL IVPB  Status:  Discontinued     2 g 100 mL/hr over 30 Minutes Intravenous Every 24 hours 03/09/17 0157 03/10/17 0851   03/09/17 0145  metroNIDAZOLE (FLAGYL) IVPB 500 mg  Status:  Discontinued     500 mg 100 mL/hr over 60 Minutes Intravenous Every 8 hours 03/09/17 0128 03/10/17 0851       Assessment/Plan HTN/tachycardia - prn lopressor, improving Hyperglycemia - SSI GERD - protonix  Acute on Chronic Calculus Cholecystitis- s/p lap chole with IOC 1/19 Dr. 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 Johney Maine  Plan - Going for ERCP today. Continue drain and antibiotics. Ok to advance diet as tolerated back to heart healthy when she returns from procedure from  surgical standpoint.   LOS: 4 days    Wellington Hampshire , [facility] 03/13/2017, 8:51 AM Pager: [phone] Consults: [phone] Mon-Fri 7:00 am-4:30 pm Sat-Sun 7:00 am-11:30 am

## 2017-03-14 ENCOUNTER — Encounter (HOSPITAL_COMMUNITY): Admission: EM | Disposition: A | Discharge status: Home / Self Care | Payer: Self-pay | Source: Home / Self Care

## 2017-03-14 ENCOUNTER — Inpatient Hospital Stay (HOSPITAL_COMMUNITY): Payer: 59

## 2017-03-14 ENCOUNTER — Inpatient Hospital Stay (HOSPITAL_COMMUNITY): Payer: 59 | Admitting: Certified Registered Nurse Anesthetist

## 2017-03-14 ENCOUNTER — Encounter (HOSPITAL_COMMUNITY): Payer: Self-pay

## 2017-03-14 DIAGNOSIS — K839 Disease of biliary tract, unspecified: Secondary | ICD-10-CM

## 2017-03-14 DIAGNOSIS — K838 Other specified diseases of biliary tract: Secondary | ICD-10-CM

## 2017-03-14 DIAGNOSIS — Z9049 Acquired absence of other specified parts of digestive tract: Secondary | ICD-10-CM

## 2017-03-14 HISTORY — PX: ERCP: SHX5425

## 2017-03-14 LAB — CK TOTAL AND CKMB (NOT AT ARMC)
CK, MB: 2.3 ng/mL (ref 0.5–5.0)
Relative Index: INVALID (ref 0.0–2.5)
Total CK: 39 U/L (ref 38–234)

## 2017-03-14 LAB — BASIC METABOLIC PANEL
Anion gap: 8 (ref 5–15)
BUN: 11 mg/dL (ref 6–20)
CO2: 25 mmol/L (ref 22–32)
Calcium: 9 mg/dL (ref 8.9–10.3)
Chloride: 107 mmol/L (ref 101–111)
Creatinine, Ser: 0.63 mg/dL (ref 0.44–1.00)
GFR calc Af Amer: >60 mL/min (ref >60)
GFR calc non Af Amer: >60 mL/min (ref >60)
Glucose, Bld: 108 mg/dL — ABNORMAL HIGH (ref 65–99)
Potassium: 3.7 mmol/L (ref 3.5–5.1)
Sodium: 140 mmol/L (ref 135–145)

## 2017-03-14 LAB — GLUCOSE, CAPILLARY
Glucose-Capillary: 99 mg/dL (ref 65–99)
Glucose-Capillary: 106 mg/dL — ABNORMAL HIGH (ref 65–99)
Glucose-Capillary: 132 mg/dL — ABNORMAL HIGH (ref 65–99)
Glucose-Capillary: 175 mg/dL — ABNORMAL HIGH (ref 65–99)

## 2017-03-14 LAB — CBC
HCT: 35.2 % — ABNORMAL LOW (ref 36.0–46.0)
Hemoglobin: 11.8 g/dL — ABNORMAL LOW (ref 12.0–15.0)
MCH: 28.6 pg (ref 26.0–34.0)
MCHC: 33.5 g/dL (ref 30.0–36.0)
MCV: 85.4 fL (ref 78.0–100.0)
Platelets: 373 10*3/uL (ref 150–400)
RBC: 4.12 MIL/uL (ref 3.87–5.11)
RDW: 13.4 % (ref 11.5–15.5)
WBC: 9.9 10*3/uL (ref 4.0–10.5)

## 2017-03-14 LAB — TROPONIN I: Troponin I: <0.03 ng/mL (ref <0.03)

## 2017-03-14 SURGERY — ERCP, WITH INTERVENTION IF INDICATED
Anesthesia: General

## 2017-03-14 MED ORDER — SODIUM CHLORIDE 0.9 % IV SOLN
INTRAVENOUS | Status: DC | PRN
  Administered 2017-03-14: 12:00:00 via INTRAVENOUS

## 2017-03-14 MED ORDER — PROPOFOL 10 MG/ML IV BOLUS
INTRAVENOUS | Status: DC | PRN
  Administered 2017-03-14: 150 mg via INTRAVENOUS
  Administered 2017-03-14: 50 mg via INTRAVENOUS

## 2017-03-14 MED ORDER — ROCURONIUM BROMIDE 10 MG/ML (PF) SYRINGE
PREFILLED_SYRINGE | INTRAVENOUS | Status: DC | PRN
  Administered 2017-03-14: 50 mg via INTRAVENOUS

## 2017-03-14 MED ORDER — SODIUM CHLORIDE 0.9 % IV SOLN
INTRAVENOUS | Status: DC | PRN
  Administered 2017-03-14: 20 mL

## 2017-03-14 MED ORDER — CIPROFLOXACIN IN D5W 400 MG/200ML IV SOLN: 400.0000 mg | Freq: Once | INTRAVENOUS | Status: DC

## 2017-03-14 MED ORDER — INDOMETHACIN 50 MG RE SUPP
RECTAL | Status: AC
  Filled 2017-03-14: qty 2

## 2017-03-14 MED ORDER — FENTANYL CITRATE (PF) 100 MCG/2ML IJ SOLN
INTRAMUSCULAR | Status: AC
  Filled 2017-03-14: qty 2

## 2017-03-14 MED ORDER — PHENYLEPHRINE 40 MCG/ML (10ML) SYRINGE FOR IV PUSH (FOR BLOOD PRESSURE SUPPORT)
PREFILLED_SYRINGE | INTRAVENOUS | Status: DC | PRN
  Administered 2017-03-14: 120 ug via INTRAVENOUS
  Administered 2017-03-14: 80 ug via INTRAVENOUS
  Administered 2017-03-14: 120 ug via INTRAVENOUS
  Administered 2017-03-14: 80 ug via INTRAVENOUS
  Administered 2017-03-14: 120 ug via INTRAVENOUS

## 2017-03-14 MED ORDER — SCOPOLAMINE 1 MG/3DAYS TD PT72
MEDICATED_PATCH | TRANSDERMAL | Status: AC
Start: 2017-03-14 — End: 2017-03-14
  Filled 2017-03-14: qty 1

## 2017-03-14 MED ORDER — METOCLOPRAMIDE HCL 5 MG/ML IJ SOLN: 5.0000 mg | Freq: Four times a day (QID) | INTRAMUSCULAR | Status: DC | PRN

## 2017-03-14 MED ORDER — GLUCAGON HCL RDNA (DIAGNOSTIC) 1 MG IJ SOLR
INTRAMUSCULAR | Status: AC
  Filled 2017-03-14: qty 1

## 2017-03-14 MED ORDER — GLUCAGON HCL RDNA (DIAGNOSTIC) 1 MG IJ SOLR
INTRAMUSCULAR | Status: DC | PRN
  Administered 2017-03-14 (×2): 0.25 mg via INTRAVENOUS

## 2017-03-14 MED ORDER — PROCHLORPERAZINE EDISYLATE 5 MG/ML IJ SOLN
5.0000 mg | INTRAMUSCULAR | Status: DC | PRN
  Filled 2017-03-14: qty 2

## 2017-03-14 MED ORDER — EPHEDRINE SULFATE-NACL 50-0.9 MG/10ML-% IV SOSY
PREFILLED_SYRINGE | INTRAVENOUS | Status: DC | PRN
  Administered 2017-03-14: 5 mg via INTRAVENOUS

## 2017-03-14 MED ORDER — SUGAMMADEX SODIUM 200 MG/2ML IV SOLN
INTRAVENOUS | Status: DC | PRN
  Administered 2017-03-14: 200 mg via INTRAVENOUS

## 2017-03-14 MED ORDER — MENTHOL 3 MG MT LOZG: 1.0000 | LOZENGE | OROMUCOSAL | Status: DC | PRN

## 2017-03-14 MED ORDER — LIDOCAINE 2% (20 MG/ML) 5 ML SYRINGE
INTRAMUSCULAR | Status: DC | PRN
  Administered 2017-03-14: 80 mg via INTRAVENOUS

## 2017-03-14 MED ORDER — DEXAMETHASONE SODIUM PHOSPHATE 4 MG/ML IJ SOLN
INTRAMUSCULAR | Status: DC | PRN
  Administered 2017-03-14: 10 mg via INTRAVENOUS

## 2017-03-14 NOTE — Progress Notes (Signed)
   03/14/17 0400  Vitals  Temp Source Oral  BP (!) 181/108  BP Location Left Arm  BP Method Automatic  Patient Position (if appropriate) Lying  Pulse Rate (!) 122  Pulse Rate Source Dinamap  Resp 18  Oxygen Therapy  SpO2 95 %  O2 Device Room Air  Patient c/o chest pain. Vital signs revealed elevated BP and heart rate.  PRN Hydralazine and Metoprolol given. Heart rate decreased to 96. O2 on @ 2L/mn improved.  EKG revealed sinus tachycardia.  Rapid Response nurse Anderson Malta notified and came the patient bedside.  Repeated BP 147/82 HR 103.

## 2017-03-14 NOTE — Addendum Note (Signed)
Addendum  created 03/14/17 1351 by Claudia Desanctis, CRNA   Charge Capture section accepted

## 2017-03-14 NOTE — Anesthesia Procedure Notes (Signed)
Procedure Name: Intubation Date/Time: 03/14/2017 11:54 AM Performed by: Claudia Desanctis, CRNA Pre-anesthesia Checklist: Patient identified, Emergency Drugs available, Suction available and Patient being monitored Patient Re-evaluated:Patient Re-evaluated prior to induction Oxygen Delivery Method: Circle system utilized Preoxygenation: Pre-oxygenation with 100% oxygen Induction Type: IV induction Ventilation: Mask ventilation without difficulty Laryngoscope Size: 2 and Miller Grade View: Grade I Tube type: Oral Tube size: 7.0 mm Number of attempts: 1 Airway Equipment and Method: Stylet Placement Confirmation: ETT inserted through vocal cords under direct vision,  positive ETCO2 and breath sounds checked- equal and bilateral Secured at: 23 cm Tube secured with: Tape Dental Injury: Teeth and Oropharynx as per pre-operative assessment

## 2017-03-14 NOTE — Anesthesia Preprocedure Evaluation (Addendum)
Anesthesia Evaluation  Patient identified by MRN, date of birth, ID band Patient awake    Reviewed: Allergy & Precautions, NPO status , Patient's Chart, lab work & pertinent test results  History of Anesthesia Complications (+) PONV  Airway Mallampati: II  TM Distance: >3 FB Neck ROM: Full    Dental  (+) Dental Advisory Given, Chipped, Missing   Pulmonary neg pulmonary ROS,    Pulmonary exam normal breath sounds clear to auscultation       Cardiovascular hypertension, Pt. on medications Normal cardiovascular exam Rhythm:Regular Rate:Normal     Neuro/Psych  Headaches,  Neuromuscular disease negative psych ROS   GI/Hepatic Neg liver ROS, GERD  Medicated,Barrett's esophagus   Endo/Other  diabetes, Type 2, Oral Hypoglycemic AgentsObesity  Renal/GU negative Renal ROS   Urinary incontinence    Musculoskeletal negative musculoskeletal ROS (+)   Abdominal   Peds  Hematology negative hematology ROS (+)   Anesthesia Other Findings   Reproductive/Obstetrics negative OB ROS                            Anesthesia Physical  Anesthesia Plan  ASA: II  Anesthesia Plan: General   Post-op Pain Management:    Induction: Intravenous  PONV Risk Score and Plan: 3 and Ondansetron, Dexamethasone, Midazolam, Treatment may vary due to age or medical condition and Scopolamine patch - Pre-op  Airway Management Planned: Oral ETT  Additional Equipment: None  Intra-op Plan:   Post-operative Plan: Extubation in OR  Informed Consent: I have reviewed the patients History and Physical, chart, labs and discussed the procedure including the risks, benefits and alternatives for the proposed anesthesia with the patient or authorized representative who has indicated his/her understanding and acceptance.   Dental advisory given  Plan Discussed with: CRNA  Anesthesia Plan Comments:        Anesthesia  Quick Evaluation

## 2017-03-14 NOTE — Op Note (Signed)
[facility] Patient Name: Whitney Neal Procedure Date: 03/14/2017 MRN: [id number] Attending MD: Ladene Artist , MD Date of Birth: 08-22-1960 CSN: [id number] Age: 57 Admit Type: Inpatient Procedure:                ERCP Indications:              Treatment of bile leak Providers:                Pricilla Riffle. Fuller Plan, MD, Carolynn Comment RN, RN,                            Alan Mulder, Technician Referring MD:             CCS Medicines:                General Anesthesia Complications:            No immediate complications. Estimated Blood Loss:     Estimated blood loss: none. Procedure:                Pre-Anesthesia Assessment:                           - Prior to the procedure, a History and Physical                            was performed, and patient medications and                            allergies were reviewed. The patient's tolerance of                            previous anesthesia was also reviewed. The risks                            and benefits of the procedure and the sedation                            options and risks were discussed with the patient.                            All questions were answered, and informed consent                            was obtained. Prior Anticoagulants: The patient has                            taken no previous anticoagulant or antiplatelet                            agents. ASA Grade Assessment: II - A patient with                            mild systemic disease. After reviewing the risks  and benefits, the patient was deemed in                            satisfactory condition to undergo the procedure.                           After obtaining informed consent, the scope was                            passed under direct vision. Throughout the                            procedure, the patient's blood pressure, pulse, and                            oxygen saturations were monitored  continuously. The                            Duodenoscope was introduced through the mouth, and                            used to inject contrast into and used to inject                            contrast into the bile duct. The ERCP was                            accomplished without difficulty. The patient                            tolerated the procedure well. Scope In: Scope Out: Findings:      A scout film of the abdomen was obtained. Surgical clips, consistent       with a previous cholecystectomy, were seen in the area of the right       upper quadrant of the abdomen. The esophagus was successfully intubated       under direct vision. The scope was advanced to a major papilla with       fresh PD sphinctertomy in the descending duodenum without detailed       examination of the pharynx, larynx and associated structures, and upper       GI tract. The upper GI tract was grossly normal. After a few passes of       the wire into the PD and a short false tract the straight Roadrunner       wire was passed into the biliary tree. The short-nosed traction       sphincterotome was passed over the guidewire and the bile duct was then       deeply cannulated. Contrast was injected. I personally interpreted the       bile duct images. There was appropriate flow of contrast through the       ducts. Extravasation of contrast in the GB fossa that appeared to       originate from the right intrahepatic branches was observed. A       cholecystectomy had been performed. The biliary tree otherwise appeared  normal. One 8.5 Fr by 5 cm plastic stent with a single external flap and       a single internal flap was placed 4 cm into the common bile duct. Bile       flowed through the stent. The stent was in good position. The biliary       tree was draining well. Impression:               - A bile leak was found in the GB fossa.                           - Prior cholecystectomy.                            - One plastic stent was placed into the common bile                            duct.                           - Prior PD sphincterotomy. Moderate Sedation:      N/A- Per Anesthesia Care Recommendation:           - Return patient to hospital ward for ongoing care.                           - Clear liquid diet, then advance as tolerated to                            soft diet today.                           - Monitor bile output.                           - ERCP with stent removal in 6-8 weeks. Procedure Code(s):        --- Professional ---                           [phone], Endoscopic retrograde                            cholangiopancreatography (ERCP); with placement of                            endoscopic stent into biliary or pancreatic duct,                            including pre- and post-dilation and guide wire                            passage, when performed, including sphincterotomy,                            when performed, each stent Diagnosis Code(s):        --- Professional ---  K83.9, Disease of biliary tract, unspecified                           Z90.49, Acquired absence of other specified parts                            of digestive tract                           K83.8, Other specified diseases of biliary tract CPT copyright 2016 American Medical Association. All rights reserved. The codes documented in this report are preliminary and upon coder review may  be revised to meet current compliance requirements. Ladene Artist, MD 03/14/2017 12:50:39 PM This report has been signed electronically. Number of Addenda: 0

## 2017-03-14 NOTE — Transfer of Care (Signed)
Immediate Anesthesia Transfer of Care Note  Patient: Whitney Neal  Procedure(s) Performed: ENDOSCOPIC RETROGRADE CHOLANGIOPANCREATOGRAPHY (ERCP) (N/A )  Patient Location: PACU and Endoscopy Unit  Anesthesia Type:General  Level of Consciousness: awake, alert , oriented and patient cooperative  Airway & Oxygen Therapy: Patient Spontanous Breathing and Patient connected to face mask  Post-op Assessment: Report given to RN and Post -op Vital signs reviewed and stable  Post vital signs: Reviewed and stable  Last Vitals:  Vitals:   03/14/17 0506 03/14/17 1109  BP: 129/70 (!) 152/82  Pulse: (!) 112 99  Resp: 18 17  Temp: 37.2 C 37.4 C  SpO2: 100% 94%    Last Pain:  Vitals:   03/14/17 1109  TempSrc: Oral  PainSc:       Patients Stated Pain Goal: 2 (16/01/09 3235)  Complications: No apparent anesthesia complications

## 2017-03-14 NOTE — Progress Notes (Addendum)
    Pre-Procedure note:   Patient is a 57 yo female with bile duct leak post lap chole. ERCP with stent placement attempted by Dr. Benson Norway yesterday but he couldn't cannulate the bile duct. He performed a pancreatic duct sphinterotomy. We have been asked to reattempt ERCP today.   Patient has no complaints today. During the night she had a brief episode of chest discomfort which completely resolved. EKG revealed sinus tachy with HR ~ 119. No SOB.  She did get metoprolol. HR 112 at 5 am. Also getting Mylicon and mylanta . CK / CKMB normal. Troponin pending.  General:   Alert, well-developed,  in NAD Neurologic:  Alert and  oriented x4;  grossly normal neurologically. Cor: mild tachycardia without murmur Chest: CTA Abd: soft, mild tenderness post lap chole, bilious drainage, ND, NABS Psych:  Pleasant, cooperative.  Normal mood and affect.  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 I agree with the Advanced Practitioner's note, impression and recommendations. ERCP with attempt at biliary stent placement today for bile leak, if troponin is negative. PD sphincterotomy performed yesterday so standard anatomy will not be present. If unable to cannulate will discuss referral to a tertiary center with Dr. Benson Norway. The risks (including pancreatitis, bleeding, perforation, infection, missed lesions, medication reactions and possible hospitalization or surgery if complications occur), benefits, and alternatives to ERCP with possible sphincterotomy, stent, biopsy were discussed with the patient and they consent to proceed.      Lucio Edward, MD [facility] [phone] Mon-Fri 8a-5p [phone] after 5p, weekends, holidays

## 2017-03-14 NOTE — Interval H&P Note (Signed)
History and Physical Interval Note:  03/14/2017 11:27 AM  Whitney Neal  has presented today for surgery, with the diagnosis of bile leak  The various methods of treatment have been discussed with the patient and family. After consideration of risks, benefits and other options for treatment, the patient has consented to  Procedure(s): ENDOSCOPIC RETROGRADE CHOLANGIOPANCREATOGRAPHY (ERCP) (N/A) as a surgical intervention .  The patient's history has been reviewed, patient examined, no change in status, stable for surgery.  I have reviewed the patient's chart and labs.  Questions were answered to the patient's satisfaction.     Pricilla Riffle. [place] Plan

## 2017-03-14 NOTE — Progress Notes (Signed)
Received orders from Dr. Marlou Starks at the time of notifying him of patient episode of chest pain.

## 2017-03-14 NOTE — Progress Notes (Signed)
[facility] Progress Note  1 Day Post-Op  Subjective: CC-  Patient with 1 episode of CP early this AM that woke her from sleep. She was tachycardic and had an elevated BP. Given lopressor. EKG sinus tachycardia. CP has resolved. Labs pending. Denies cough or SOB. States that her abdomen is feeling better. She reports mild diffuse pain. Passing more flatus this AM and had a small loose BM this morning. Denies n/v.  Objective: Vital signs in last 24 hours: Temp:  [97.8 F (36.6 C)-100.6 F (38.1 C)] 99 F (37.2 C) (01/24 0506) Pulse Rate:  [77-126] 112 (01/24 0506) Resp:  [14-19] 18 (01/24 0506) BP: (129-182)/(70-108) 129/70 (01/24 0506) SpO2:  [92 %-100 %] 100 % (01/24 0506) Weight:  [150 lb (68 kg)] 150 lb (68 kg) (01/23 1112) Last BM Date: 03/07/17  Intake/Output from previous day: 01/23 0701 - 01/24 0700 In: 2645 [P.O.:360; I.V.:2205] Out: 265 [Drains:260; Blood:5] Intake/Output this shift: No intake/output data recorded.  PE: Gen: Alert, NAD, pleasant HEENT: EOM's intact, pupils equal and round Card: tachycardic Pulm: CTAB, no W/R/R, effort normal Abd: Soft,mild distension, appropriately tender, +BS in all 4 quadrants,lapincisions C/D/I, drain with bilious output Ext: calves soft and nontender, no edema Psych: A&Ox3  Skin: no rashes noted, warm and dry  Lab Results:  Recent Labs    03/12/17 0534  WBC 12.2*  HGB 12.3  HCT 37.9  PLT 314   BMET Recent Labs    03/12/17 0534  NA 137  K 3.5  CL 104  CO2 26  GLUCOSE 109*  BUN 12  CREATININE 0.63  CALCIUM 8.5*   PT/INR No results for input(s): LABPROT, INR in the last 72 hours. CMP     Component Value Date/Time   NA 137 03/12/2017 0534   K 3.5 03/12/2017 0534   CL 104 03/12/2017 0534   CO2 26 03/12/2017 0534   GLUCOSE 109 (H) 03/12/2017 0534   BUN 12 03/12/2017 0534   BUN 12 12/07/2015   CREATININE 0.63 03/12/2017 0534   CREATININE 0.65 12/28/2016 1027   CALCIUM 8.5 (L)  03/12/2017 0534   PROT 6.6 03/12/2017 0534   ALBUMIN 3.6 03/12/2017 0534   AST 41 03/12/2017 0534   ALT 96 (H) 03/12/2017 0534   ALKPHOS 95 03/12/2017 0534   BILITOT 0.8 03/12/2017 0534   GFRNONAA >60 03/12/2017 0534   GFRNONAA 98 12/28/2016 1027   GFRAA >60 03/12/2017 0534   GFRAA 113 12/28/2016 1027   Lipase     Component Value Date/Time   LIPASE 31 03/08/2017 1954       Studies/Results: Nm Hepatobiliary Liver Func  Result Date: 03/12/2017 CLINICAL DATA:  Recent laparoscopic cholecystectomy. Postop bile leak. EXAM: NUCLEAR MEDICINE HEPATOBILIARY IMAGING TECHNIQUE: Sequential images of the abdomen were obtained out to 60 minutes following intravenous administration of radiopharmaceutical. RADIOPHARMACEUTICALS:  5.1 mCi Tc-52m Choletec IV COMPARISON:  Intraoperative cholangiogram on 03/09/2017 FINDINGS: Prompt uptake and biliary excretion of activity by the liver is seen. No gallbladder activity, consistent with prior cholecystectomy. Leak of bowel activity is seen into Morison's pouch, which extends inferiorly in the right paracolic gutter and superiorly in the right perihepatic space. No biliary activity is seen within the common bile duct or entering the small bowel. IMPRESSION: Large postop bile leak extending in the right paracolic gutter and right perihepatic space. No radiopharmaceutical activity seen within the common bile duct or small bowel. This is likely due to prominent bile leak, with common bile duct obstruction considered unlikely given  patency of common bile duct seen on recent intraoperative cholangiogram. Electronically Signed   By: Earle Gell M.D.   On: 03/12/2017 14:49   Dg C-arm 1-60 Min-no Report  Result Date: 03/13/2017 Fluoroscopy was utilized by the requesting physician.  No radiographic interpretation.    Anti-infectives: Anti-infectives (From admission, onward)   Start     Dose/Rate Route Frequency Ordered Stop   03/14/17 1100  ciprofloxacin (CIPRO)  IVPB 400 mg  Status:  Discontinued    Comments:  Given this tomorrow at 11 am (prior to ERCP). Give ONLY if her PO cipro is not resumed (all of her medications are currently on hold).   400 mg 200 mL/hr over 60 Minutes Intravenous  Once 03/13/17 1504 03/13/17 1536   03/12/17 1100  ciprofloxacin (CIPRO) tablet 500 mg     500 mg Oral 2 times daily 03/12/17 0944     03/10/17 1200  metroNIDAZOLE (FLAGYL) IVPB 500 mg     500 mg 100 mL/hr over 60 Minutes Intravenous Every 6 hours 03/10/17 0851 03/11/17 1346   03/10/17 1000  ciprofloxacin (CIPRO) IVPB 400 mg  Status:  Discontinued     400 mg 200 mL/hr over 60 Minutes Intravenous Every 12 hours 03/10/17 0851 03/12/17 0944   03/09/17 0815  clindamycin (CLEOCIN) IVPB 600 mg  Status:  Discontinued     600 mg 100 mL/hr over 30 Minutes Intravenous  Once 03/09/17 0813 03/10/17 0851   03/09/17 0815  gentamicin (GARAMYCIN) 340 mg in dextrose 5 % 50 mL IVPB     5 mg/kg  68 kg 117 mL/hr over 30 Minutes Intravenous  Once 03/09/17 0813 03/09/17 0921   03/09/17 0731  dextrose 5 % with cefTRIAXone (ROCEPHIN) ADS Med  Status:  Discontinued    Comments:  Octavio Graves   : cabinet override      03/09/17 0731 03/09/17 0817   03/09/17 0200  cefTRIAXone (ROCEPHIN) 2 g in dextrose 5 % 50 mL IVPB  Status:  Discontinued     2 g 100 mL/hr over 30 Minutes Intravenous On call to O.R. 03/09/17 0128 03/09/17 0307   03/09/17 0200  cefTRIAXone (ROCEPHIN) 2 g in dextrose 5 % 50 mL IVPB  Status:  Discontinued     2 g 100 mL/hr over 30 Minutes Intravenous Every 24 hours 03/09/17 0157 03/10/17 0851   03/09/17 0145  metroNIDAZOLE (FLAGYL) IVPB 500 mg  Status:  Discontinued     500 mg 100 mL/hr over 60 Minutes Intravenous Every 8 hours 03/09/17 0128 03/10/17 0851       Assessment/Plan HTN/tachycardia - CP early this AM HR 126 and BP 182/97, EKG sinus tachy, given lopressor. CP resolved. Troponin/CK/CKMB pending. Check CXR Hyperglycemia - SSI GERD - IV  protonix  Acute on Chronic Calculus Cholecystitis- s/p lap chole with IOC 1/19 Dr. 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 Johney Maine  Plan - Going for repeat ERCP today. Continue drain and antibiotics.  Given lopressor, tachycardia and elevated BP improved but still with HR 112. CP resolved. Follow CXR, CBC/BMP/CK/CKMB/troponin   LOS: 5 days    Wellington Hampshire , [facility] 03/14/2017, 9:10 AM Pager: [phone] Consults: [phone] Mon-Fri 7:00 am-4:30 pm Sat-Sun 7:00 am-11:30 am

## 2017-03-14 NOTE — H&P (View-Only) (Signed)
    Pre-Procedure note:   Patient is a 57 yo female with bile duct leak post lap chole. ERCP with stent placement attempted by Dr. Benson Norway yesterday but he couldn't cannulate duct. We have been asked to reattempt ERCP today.   Patient has no complaints today. During the night she had a brief episode of chest discomfort which completely resolved. EKG revealed sinus tachy with HR ~ 119. No SOB.  She did get metoprolol. HR 112 at 5 am. Also getting Mylicon and mylanta . CK / CKMB normal. Troponin pending,  General:   Alert, well-developed,  in NAD Neurologic:  Alert and  oriented x4;  grossly normal neurologically. Cor: mild tachycardia without murmur Chest: CTA Abd: soft, mild tenderness post lap chole, bilious drainage, ND, NABS Psych:  Pleasant, cooperative.  Normal mood and affect.  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 I agree with the Advanced Practitioner's note, impression and recommendations. ERCP with stent placement today for bile leak if troponin is negative. If not able to cannulate will discuss referral to a tertiary center with Dr. Benson Norway. The risks (including pancreatitis, bleeding, perforation, infection, missed lesions, medication reactions and possible hospitalization or surgery if complications occur), benefits, and alternatives to ERCP with possible sphincterotomy, stent, biopsy were discussed with the patient and they consent to proceed.    Lucio Edward, MD [facility] Regal [phone] Mon-Fri 8a-5p [phone] after 5p, weekends, holidays

## 2017-03-14 NOTE — Anesthesia Postprocedure Evaluation (Signed)
Anesthesia Post Note  Patient: Whitney Neal  Procedure(s) Performed: ENDOSCOPIC RETROGRADE CHOLANGIOPANCREATOGRAPHY (ERCP) (N/A )     Patient location during evaluation: PACU Anesthesia Type: General Level of consciousness: awake and alert Pain management: pain level controlled Vital Signs Assessment: post-procedure vital signs reviewed and stable Respiratory status: spontaneous breathing, nonlabored ventilation, respiratory function stable and patient connected to nasal cannula oxygen Cardiovascular status: blood pressure returned to baseline and stable Postop Assessment: no apparent nausea or vomiting Anesthetic complications: no    Last Vitals:  Vitals:   03/14/17 1250 03/14/17 1300  BP: 138/83 124/83  Pulse: (!) 101 98  Resp: 19 15  Temp:    SpO2: 99% 98%    Last Pain:  Vitals:   03/14/17 1243  TempSrc: Oral  PainSc:                  Audry Pili

## 2017-03-15 ENCOUNTER — Encounter (HOSPITAL_COMMUNITY): Payer: Self-pay | Admitting: Gastroenterology

## 2017-03-15 LAB — GLUCOSE, CAPILLARY: Glucose-Capillary: 151 mg/dL — ABNORMAL HIGH (ref 65–99)

## 2017-03-15 MED ORDER — OXYCODONE HCL 5 MG PO TABS: 5.0000 mg | ORAL_TABLET | ORAL | 0 refills | Status: DC | PRN

## 2017-03-15 MED FILL — oxyCODONE HCL 5 MG TABS: 5 | 5 days supply | Qty: 20 | Fill #0

## 2017-03-15 NOTE — Progress Notes (Signed)
Subjective: No complaints.  Feeling well.  Objective: Vital signs in last 24 hours: Temp:  [98.5 F (36.9 C)-99.3 F (37.4 C)] 98.8 F (37.1 C) (01/25 0537) Pulse Rate:  [84-110] 94 (01/25 0537) Resp:  [13-19] 18 (01/25 0537) BP: (121-173)/(68-88) 173/85 (01/25 0537) SpO2:  [94 %-99 %] 96 % (01/25 0537) Weight:  [68 kg (150 lb)] 68 kg (150 lb) (01/24 1109) Last BM Date: 03/07/17  Intake/Output from previous day: 01/24 0701 - 01/25 0700 In: 2002.5 [P.O.:780; I.V.:1222.5] Out: 210 [Drains:210] Intake/Output this shift: No intake/output data recorded.  General appearance: alert and no distress GI: tender at the incision sites.  Lab Results: Recent Labs    03/14/17 0924  WBC 9.9  HGB 11.8*  HCT 35.2*  PLT 373   BMET Recent Labs    03/14/17 0924  NA 140  K 3.7  CL 107  CO2 25  GLUCOSE 108*  BUN 11  CREATININE 0.63  CALCIUM 9.0   LFT No results for input(s): PROT, ALBUMIN, AST, ALT, ALKPHOS, BILITOT, BILIDIR, IBILI in the last 72 hours. PT/INR No results for input(s): LABPROT, INR in the last 72 hours. Hepatitis Panel No results for input(s): HEPBSAG, HCVAB, HEPAIGM, HEPBIGM in the last 72 hours. C-Diff No results for input(s): CDIFFTOX in the last 72 hours. Fecal Lactopherrin No results for input(s): FECLLACTOFRN in the last 72 hours.  Studies/Results: Dg Chest Port 1 View  Result Date: 03/14/2017 CLINICAL DATA:  New onset of shortness of breath and mid chest pain last night. History of atypical chest pain, Barrett's esophagus, hypertension, nonsmoker. EXAM: PORTABLE CHEST 1 VIEW COMPARISON:  Chest x-ray of March 08, 2017 FINDINGS: The lungs are hypoinflated. There is linear increased density at the left lung base which is new. The heart and pulmonary vascularity are normal. The mediastinum is normal in width. There is no pleural effusion. A tubular structure projects in the upper abdomen. IMPRESSION: Bilateral hypoinflation. New subsegmental atelectasis at  the left lung base. No CHF. Electronically Signed   By: David  Martinique M.D.   On: 03/14/2017 09:47   Dg Ercp Biliary & Pancreatic Ducts  Result Date: 03/14/2017 CLINICAL DATA:  Bile leak post lap cholecystectomy EXAM: ERCP TECHNIQUE: Multiple spot images obtained with the fluoroscopic device and submitted for interpretation post-procedure. COMPARISON:  Scintigraphy 03/12/2017 FINDINGS: A series of fluoroscopic spot images document endoscopic cannulation and opacification of the CBD with passage of a guidewire and deployment of a plastic endoscopic biliary stent across the CB D. there is extravasation of contrast in the region of the cholecystectomy clips . IMPRESSION: 1. Endoscopic biliary stent placement. 2. Persistent biliary leak is noted. These images were submitted for radiologic interpretation only. Please see the procedural report for the amount of contrast and the fluoroscopy time utilized. Electronically Signed   By: Lucrezia Europe M.D.   On: 03/14/2017 13:31   Dg C-arm 1-60 Min-no Report  Result Date: 03/13/2017 Fluoroscopy was utilized by the requesting physician.  No radiographic interpretation.    Medications:  Scheduled: . acetaminophen  1,000 mg Oral TID  . aspirin EC  81 mg Oral Daily  . atorvastatin  40 mg Oral Daily  . ciprofloxacin  500 mg Oral BID  . docusate sodium  100 mg Oral BID  . gabapentin  300 mg Oral BID  . insulin aspart  0-15 Units Subcutaneous TID WC  . insulin aspart  0-5 Units Subcutaneous QHS  . lip balm  1 application Topical BID  . losartan  25 mg  Oral Daily  . pantoprazole  40 mg Oral Daily  . psyllium  1 packet Oral Daily   Continuous: . ciprofloxacin    . lactated ringers 1,000 mL (03/15/17 0537)  . methocarbamol (ROBAXIN)  IV      Assessment/Plan: 1) S/p bile duct leak. 2) S/p lap chole.   Clinically she is well.  Some mild soreness in the abdomen.  Drainage has decreased.  Dr. Lynne Leader assistance greatly appreciated.  Plan: 1) D/C per  Surgery. 2) Follow up in the office in two weeks.  I will pull the drain in the next 1-3 months.  LOS: 6 days   Rhyanna Sorce D 03/15/2017, 7:05 AM

## 2017-03-15 NOTE — Discharge Summary (Signed)
[facility] Discharge Summary   Patient ID: Whitney Neal MRN: [id number] DOB/AGE: October 01, 1960 57 y.o.  Admit date: 03/08/2017 Discharge date: 03/15/2017  Admitting Diagnosis: Acute cholecystitis  Discharge Diagnosis Patient Active Problem List   Diagnosis Date Noted  . Bile leak   . Acute on chronic cholecystitis s/p lap cholecystectomy 03/09/2017 03/09/2017  . 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 Constipation   . Hemangioma of liver 02/04/2017  . Elevated fasting glucose 05/23/2016  . Cardiac risk counseling 05/23/2016  . Migraine 10/01/2011  . Numbness and tingling in left hand 01/05/2011  . Hyperglycemia   . Essential hypertension 01/25/2010  . GERD 01/25/2010  . COLONIC POLYPS, HYPERPLASTIC, HX OF 01/25/2010  . CERVICAL STRAIN 11/23/2009  . Hyperlipidemia 09/26/2009  . OSTEOPENIA 07/11/2009  . GASTRIC ULCER, HX OF 07/11/2009  . History of Barrett's esophagus 07/11/2009  . Vitamin D deficiency 07/04/2009  . CHEST PAIN, ATYPICAL, HX OF 07/04/2009    Consultants Gastroenterology  Imaging: Dg Chest Port 1 View  Result Date: 03/14/2017 CLINICAL DATA:  New onset of shortness of breath and mid chest pain last night. History of atypical chest pain, Barrett's esophagus, hypertension, nonsmoker. EXAM: PORTABLE CHEST 1 VIEW COMPARISON:  Chest x-ray of March 08, 2017 FINDINGS: The lungs are hypoinflated. There is linear increased density at the left lung base which is new. The heart and pulmonary vascularity are normal. The mediastinum is normal in width. There is no pleural effusion. A tubular structure projects in the upper abdomen. IMPRESSION: Bilateral hypoinflation. New subsegmental atelectasis at the left lung base. No CHF. Electronically Signed   By: David  Martinique M.D.   On: 03/14/2017 09:47   Dg Ercp Biliary & Pancreatic Ducts  Result Date: 03/14/2017 CLINICAL DATA:  Bile leak post lap cholecystectomy EXAM:  ERCP TECHNIQUE: Multiple spot images obtained with the fluoroscopic device and submitted for interpretation post-procedure. COMPARISON:  Scintigraphy 03/12/2017 FINDINGS: A series of fluoroscopic spot images document endoscopic cannulation and opacification of the CBD with passage of a guidewire and deployment of a plastic endoscopic biliary stent across the CB D. there is extravasation of contrast in the region of the cholecystectomy clips . IMPRESSION: 1. Endoscopic biliary stent placement. 2. Persistent biliary leak is noted. These images were submitted for radiologic interpretation only. Please see the procedural report for the amount of contrast and the fluoroscopy time utilized. Electronically Signed   By: Lucrezia Europe M.D.   On: 03/14/2017 13:31   Dg C-arm 1-60 Min-no Report  Result Date: 03/13/2017 Fluoroscopy was utilized by the requesting physician.  No radiographic interpretation.    Procedures Dr. Johney Maine (03/09/16) - Laparoscopic Cholecystectomy with IOC, ligation of biliary duct of Luschka  Hospital Course: Whitney Neal is a 57yo female who was transferred from [facility] to [facility] 1/19 with right sided abdominal pain.  Ultrasound confirmed gallstones with positive sonographic Murphy sign suspicious for early acute cholecystitis. Patient was transferred to [facility] for surgical consultation. Patient was admitted and underwent procedure listed above.  Intraoperatively she was found to have acute on Chronic Calculus Cholecystitis, aberrent biliary duct of Lushka on gallbladder fossa, and fatty steatohepatitis. Tolerated procedure well and was transferred to the floor.  She did develop an ileus postoperatively but this improved with time and diet was advanced as tolerated.  Patient had persistent bilious output from surgical JP drain; HIDA was performed and confirmed a postoperative bile leak. GI was consulted and performed an ERCP. First ERCP was  unsuccessful but this was repeated 1/24 with successful  placement of a stent into the common bile duct. JP drainage decreased and became serosanguinous. On 1/25 the patient was voiding well, tolerating diet, ambulating well, pain well controlled, vital signs stable, incisions c/d/i and felt stable for discharge home with the JP drain.  Patient will follow up in our office in about 2 weeks and knows to call with questions or concerns.    I have personally reviewed the patients medication history on the [place] controlled substance database.    Physical Exam: Gen: Alert, NAD, pleasant HEENT: EOM's intact, pupils equal and round Card: RRR Pulm: CTAB, no W/R/R, effort normal Abd: Soft,mild distension, appropriately tender, +BS in all 4 quadrants,lapincisions C/D/I, drain with serosanguinous output Ext: calves soft and nontender, no edema Psych: A&Ox3  Skin: no rashes noted, warm and dry  Allergies as of 03/15/2017      Reactions   Codeine    REACTION: headache, visual disturbance   Morphine And Related    Rocephin [ceftriaxone Sodium In Dextrose] Rash      Medication List    TAKE these medications   aspirin 81 MG EC tablet Take 81 mg by mouth daily.   atorvastatin 40 MG tablet Commonly known as:  LIPITOR Take 1 tablet (40 mg total) by mouth daily.   fluticasone 50 MCG/ACT nasal spray Commonly known as:  FLONASE Place 1 spray into both nostrils daily as needed for allergies or rhinitis.   losartan 25 MG tablet Commonly known as:  COZAAR Take 1 tablet (25 mg total) daily by mouth.   metFORMIN 500 MG 24 hr tablet Commonly known as:  GLUCOPHAGE XR Take 1 tablet (500 mg total) by mouth daily with breakfast.   omeprazole 40 MG capsule Commonly known as:  PRILOSEC Take 1 capsule (40 mg total) by mouth daily.   oxyCODONE 5 MG immediate release tablet Commonly known as:  Oxy IR/ROXICODONE Take 1 tablet (5 mg total) by mouth every 4 (four) hours as needed for severe pain or breakthrough pain.   sodium bicarbonate 325 MG tablet Take  1-2 tablets (325-650 mg total) by mouth 2 (two) times daily. Prn heartburn   sucralfate 1 GM/10ML suspension Commonly known as:  CARAFATE Take 10 mLs (1 g total) 4 (four) times daily -  with meals and at bedtime by mouth.        Follow-up Information    Michael Boston, MD. Go on 04/01/2017.   Specialty:  General Surgery Why:  Your appointment is 04/01/2017 at [facility]. Please arrive 30 minutes prior to your appointment to check in and fill out paperwork. Bring photo ID and insurance information. Contact information: [address] [place] [address] [phone]        Carol Ada, MD. Call in 2 week(s).   Specialty:  Gastroenterology Why:  Call to arrange follow up with Dr. Corlis Leak information: [address] [place] [address] [phone]           Signed: Wellington Hampshire, [facility] 03/15/2017, 8:53 AM Pager: [phone] Consults: [phone] Mon-Fri 7:00 am-4:30 pm Sat-Sun 7:00 am-11:30 am

## 2017-03-15 NOTE — Progress Notes (Signed)
Discharge instructions given to patient and family, verbalized understanding, prescriptions given to family

## 2017-03-18 ENCOUNTER — Encounter: Payer: Self-pay | Admitting: *Deleted

## 2017-03-18 ENCOUNTER — Other Ambulatory Visit: Payer: Self-pay | Admitting: *Deleted

## 2017-03-18 NOTE — Patient Outreach (Signed)
[facility]) Care Management  03/18/2017  Whitney Neal October 04, 1960 [id number]   Subjective: Telephone call to patient's home number times 2, spoke with patient, and HIPAA verified.  Discussed [facility] Care Management UMR Transition of care follow up, patient voiced understanding, and is in agreement to follow up.  Patient gave [facility] verbal authorization to speak with husband Pleasant Britz) regarding healthcare needs as needed.  Spoke with patient and husband, patient states she having pain being managed by pain medication, pulling sensation when eating, stent in place, has follow up appointment with gastroenterologist on 03/29/17, and surgeon on 04/01/17.   Husband states he will call surgeon's office regarding sodium bicarbonate question, pulling sensation with eating, and patient's increase heartburn.  Patient states she is able to manage self care and has assistance as needed with activities of daily living / home management. Patient states she has a very supportive family and extended family.  Patient and husband voices understanding of medical diagnosis, surgery, and treatment plan.   Patient and husband states she is accessing the following [facility] benefits: outpatient pharmacy, hospital indemnity (not chosen benefit), has started family medical leave act [employer]) process, and husband will follow up on patient's behalf as needed.  Husband verbally given the following benefit contact information per his request: [employer] [phone], [place] Special[facility] [phone]), and [place] [phone]).   Discussed prediabetes / diabetes disease management resources for [facility] employees and advised RNCM will send web sites in the successful outreach letter. Husband states patient  does not have any education material, transition of care, care coordination, transportation, community resource, or pharmacy needs at this time.   Patient and husband  states they is very appreciative of the follow up and is in agreement to receive [facility] information in [place] versus Spanish.      Objective: Per KPN (Knowledge Performance Now, point of care tool) and chart review, patient hospitalized 03/08/17 -03/15/17 for Acute cholecystitis.    Status post Laparoscopic cholecystectomy with intraoperative cholangiogram and Ligation of biliary duct of Luschka on 03/09/17.   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       Assessment: Received UMR TOC  Referral on 03/12/17.  Transition of care follow up completed, no care management needs, and will proceed with case closure.       Plan: RNCM will send patient successful outreach letter, [facility] pamphlet, and magnet. RNCM will send case closure due to follow up completed / no care management needs request to [facility].       Nolia Tschantz H. Annia Friendly, BSN, [place] [facility] Telephonic CM Phone: [phone] Fax: [phone]

## 2017-03-21 NOTE — Anesthesia Postprocedure Evaluation (Signed)
Anesthesia Post Note  Patient: Whitney Neal  Procedure(s) Performed: ENDOSCOPIC RETROGRADE CHOLANGIOPANCREATOGRAPHY (ERCP) (N/A )     Patient location during evaluation: PACU Anesthesia Type: General Level of consciousness: awake and alert Pain management: pain level controlled Vital Signs Assessment: post-procedure vital signs reviewed and stable Respiratory status: spontaneous breathing, nonlabored ventilation, respiratory function stable and patient connected to nasal cannula oxygen Cardiovascular status: blood pressure returned to baseline and stable Postop Assessment: no apparent nausea or vomiting Anesthetic complications: no    Last Vitals:  Vitals:   03/15/17 0205 03/15/17 0537  BP: 126/68 (!) 173/85  Pulse: 95 94  Resp: 18 18  Temp: 36.9 C 37.1 C  SpO2: 95% 96%    Last Pain:  Vitals:   03/15/17 0537  TempSrc: Oral  PainSc:                  [place] S

## 2017-03-27 ENCOUNTER — Other Ambulatory Visit: Payer: Self-pay | Admitting: Gastroenterology

## 2017-03-27 DIAGNOSIS — M549 Dorsalgia, unspecified: Secondary | ICD-10-CM | POA: Diagnosis not present

## 2017-03-27 DIAGNOSIS — K832 Perforation of bile duct: Secondary | ICD-10-CM | POA: Diagnosis not present

## 2017-03-27 MED FILL — METHYLPREDNISOLONE 4 MG TAB: 4 | 6 days supply | Qty: 21 | Fill #0

## 2017-03-27 MED FILL — OMEPRAZOLE DR 40 MG CAPSULE: 40 | 30 days supply | Qty: 30 | Fill #3

## 2017-04-01 DIAGNOSIS — K812 Acute cholecystitis with chronic cholecystitis: Secondary | ICD-10-CM | POA: Diagnosis not present

## 2017-04-02 ENCOUNTER — Encounter (HOSPITAL_COMMUNITY): Payer: Self-pay

## 2017-04-02 ENCOUNTER — Other Ambulatory Visit: Payer: Self-pay

## 2017-04-02 ENCOUNTER — Ambulatory Visit (INDEPENDENT_AMBULATORY_CARE_PROVIDER_SITE_OTHER): Payer: 59 | Admitting: Osteopathic Medicine

## 2017-04-02 ENCOUNTER — Encounter: Payer: Self-pay | Admitting: Osteopathic Medicine

## 2017-04-02 ENCOUNTER — Inpatient Hospital Stay (HOSPITAL_COMMUNITY)
Admission: EM | Admit: 2017-04-02 | Discharge: 2017-04-08 | DRG: 862 | Disposition: A | Discharge status: Home / Self Care | Payer: 59 | Attending: Surgery | Admitting: Surgery

## 2017-04-02 ENCOUNTER — Emergency Department (HOSPITAL_COMMUNITY): Payer: 59

## 2017-04-02 VITALS — BP 119/79 | HR 130 | Temp 98.7°F | Wt 140.1 lb

## 2017-04-02 DIAGNOSIS — E785 Hyperlipidemia, unspecified: Secondary | ICD-10-CM | POA: Diagnosis present

## 2017-04-02 DIAGNOSIS — K8010 Calculus of gallbladder with chronic cholecystitis without obstruction: Secondary | ICD-10-CM | POA: Diagnosis present

## 2017-04-02 DIAGNOSIS — Z9049 Acquired absence of other specified parts of digestive tract: Secondary | ICD-10-CM | POA: Diagnosis not present

## 2017-04-02 DIAGNOSIS — Z7982 Long term (current) use of aspirin: Secondary | ICD-10-CM

## 2017-04-02 DIAGNOSIS — Z86010 Personal history of colonic polyps: Secondary | ICD-10-CM

## 2017-04-02 DIAGNOSIS — Z7984 Long term (current) use of oral hypoglycemic drugs: Secondary | ICD-10-CM

## 2017-04-02 DIAGNOSIS — K2270 Barrett's esophagus without dysplasia: Secondary | ICD-10-CM | POA: Diagnosis present

## 2017-04-02 DIAGNOSIS — R32 Unspecified urinary incontinence: Secondary | ICD-10-CM | POA: Diagnosis present

## 2017-04-02 DIAGNOSIS — Z881 Allergy status to other antibiotic agents status: Secondary | ICD-10-CM

## 2017-04-02 DIAGNOSIS — M8580 Other specified disorders of bone density and structure, unspecified site: Secondary | ICD-10-CM | POA: Diagnosis present

## 2017-04-02 DIAGNOSIS — I10 Essential (primary) hypertension: Secondary | ICD-10-CM | POA: Diagnosis present

## 2017-04-02 DIAGNOSIS — K750 Abscess of liver: Secondary | ICD-10-CM | POA: Diagnosis present

## 2017-04-02 DIAGNOSIS — K219 Gastro-esophageal reflux disease without esophagitis: Secondary | ICD-10-CM | POA: Diagnosis present

## 2017-04-02 DIAGNOSIS — R000 Tachycardia, unspecified: Secondary | ICD-10-CM | POA: Diagnosis present

## 2017-04-02 DIAGNOSIS — B954 Other streptococcus as the cause of diseases classified elsewhere: Secondary | ICD-10-CM | POA: Diagnosis present

## 2017-04-02 DIAGNOSIS — R509 Fever, unspecified: Secondary | ICD-10-CM | POA: Diagnosis not present

## 2017-04-02 DIAGNOSIS — S3613XD Injury of bile duct, subsequent encounter: Secondary | ICD-10-CM

## 2017-04-02 DIAGNOSIS — K812 Acute cholecystitis with chronic cholecystitis: Secondary | ICD-10-CM

## 2017-04-02 DIAGNOSIS — G43909 Migraine, unspecified, not intractable, without status migrainosus: Secondary | ICD-10-CM | POA: Diagnosis present

## 2017-04-02 DIAGNOSIS — J984 Other disorders of lung: Secondary | ICD-10-CM | POA: Diagnosis not present

## 2017-04-02 DIAGNOSIS — S3613XA Injury of bile duct, initial encounter: Secondary | ICD-10-CM | POA: Diagnosis present

## 2017-04-02 DIAGNOSIS — R1110 Vomiting, unspecified: Secondary | ICD-10-CM | POA: Diagnosis not present

## 2017-04-02 DIAGNOSIS — E119 Type 2 diabetes mellitus without complications: Secondary | ICD-10-CM | POA: Diagnosis present

## 2017-04-02 DIAGNOSIS — T8149XA Infection following a procedure, other surgical site, initial encounter: Secondary | ICD-10-CM | POA: Diagnosis present

## 2017-04-02 DIAGNOSIS — Z885 Allergy status to narcotic agent status: Secondary | ICD-10-CM

## 2017-04-02 DIAGNOSIS — Y838 Other surgical procedures as the cause of abnormal reaction of the patient, or of later complication, without mention of misadventure at the time of the procedure: Secondary | ICD-10-CM | POA: Diagnosis present

## 2017-04-02 DIAGNOSIS — K651 Peritoneal abscess: Secondary | ICD-10-CM | POA: Diagnosis not present

## 2017-04-02 DIAGNOSIS — Z1612 Extended spectrum beta lactamase (ESBL) resistance: Secondary | ICD-10-CM | POA: Diagnosis present

## 2017-04-02 DIAGNOSIS — R6510 Systemic inflammatory response syndrome (SIRS) of non-infectious origin without acute organ dysfunction: Secondary | ICD-10-CM | POA: Diagnosis present

## 2017-04-02 DIAGNOSIS — R112 Nausea with vomiting, unspecified: Secondary | ICD-10-CM | POA: Diagnosis not present

## 2017-04-02 DIAGNOSIS — K5900 Constipation, unspecified: Secondary | ICD-10-CM | POA: Diagnosis present

## 2017-04-02 DIAGNOSIS — K7581 Nonalcoholic steatohepatitis (NASH): Secondary | ICD-10-CM | POA: Diagnosis present

## 2017-04-02 DIAGNOSIS — R1010 Upper abdominal pain, unspecified: Secondary | ICD-10-CM | POA: Diagnosis not present

## 2017-04-02 DIAGNOSIS — Z79899 Other long term (current) drug therapy: Secondary | ICD-10-CM | POA: Diagnosis not present

## 2017-04-02 DIAGNOSIS — K9189 Other postprocedural complications and disorders of digestive system: Secondary | ICD-10-CM | POA: Diagnosis not present

## 2017-04-02 DIAGNOSIS — R188 Other ascites: Secondary | ICD-10-CM

## 2017-04-02 DIAGNOSIS — K839 Disease of biliary tract, unspecified: Secondary | ICD-10-CM | POA: Diagnosis present

## 2017-04-02 LAB — URINALYSIS, ROUTINE W REFLEX MICROSCOPIC
Bilirubin Urine: NEGATIVE
Glucose, UA: NEGATIVE mg/dL
Hgb urine dipstick: NEGATIVE
Ketones, ur: 20 mg/dL — AB
Leukocytes, UA: NEGATIVE
Nitrite: NEGATIVE
Protein, ur: NEGATIVE mg/dL
Specific Gravity, Urine: >1.046 — ABNORMAL HIGH (ref 1.005–1.030)
pH: 7 (ref 5.0–8.0)

## 2017-04-02 LAB — COMPREHENSIVE METABOLIC PANEL
ALT: 24 U/L (ref 14–54)
AST: 31 U/L (ref 15–41)
Albumin: 3 g/dL — ABNORMAL LOW (ref 3.5–5.0)
Alkaline Phosphatase: 126 U/L (ref 38–126)
Anion gap: 15 (ref 5–15)
BUN: 15 mg/dL (ref 6–20)
CO2: 20 mmol/L — ABNORMAL LOW (ref 22–32)
Calcium: 9.4 mg/dL (ref 8.9–10.3)
Chloride: 100 mmol/L — ABNORMAL LOW (ref 101–111)
Creatinine, Ser: 0.71 mg/dL (ref 0.44–1.00)
GFR calc Af Amer: >60 mL/min (ref >60)
GFR calc non Af Amer: >60 mL/min (ref >60)
Glucose, Bld: 123 mg/dL — ABNORMAL HIGH (ref 65–99)
Potassium: 3.9 mmol/L (ref 3.5–5.1)
Sodium: 135 mmol/L (ref 135–145)
Total Bilirubin: 1.1 mg/dL (ref 0.3–1.2)
Total Protein: 8.6 g/dL — ABNORMAL HIGH (ref 6.5–8.1)

## 2017-04-02 LAB — CBC WITH DIFFERENTIAL/PLATELET
Basophils Absolute: 0 10*3/uL (ref 0.0–0.1)
Basophils Relative: 0 %
Eosinophils Absolute: 0 10*3/uL (ref 0.0–0.7)
Eosinophils Relative: 0 %
HCT: 34.1 % — ABNORMAL LOW (ref 36.0–46.0)
Hemoglobin: 11.6 g/dL — ABNORMAL LOW (ref 12.0–15.0)
Lymphocytes Relative: 8 %
Lymphs Abs: 0.9 10*3/uL (ref 0.7–4.0)
MCH: 28.2 pg (ref 26.0–34.0)
MCHC: 34 g/dL (ref 30.0–36.0)
MCV: 82.8 fL (ref 78.0–100.0)
Monocytes Absolute: 0.8 10*3/uL (ref 0.1–1.0)
Monocytes Relative: 7 %
Neutro Abs: 9.4 10*3/uL — ABNORMAL HIGH (ref 1.7–7.7)
Neutrophils Relative %: 85 %
Platelets: 587 10*3/uL — ABNORMAL HIGH (ref 150–400)
RBC: 4.12 MIL/uL (ref 3.87–5.11)
RDW: 13.1 % (ref 11.5–15.5)
WBC: 11.1 10*3/uL — ABNORMAL HIGH (ref 4.0–10.5)

## 2017-04-02 LAB — LIPASE, BLOOD: Lipase: 31 U/L (ref 11–51)

## 2017-04-02 LAB — I-STAT CG4 LACTIC ACID, ED: Lactic Acid, Venous: 1.34 mmol/L (ref 0.5–1.9)

## 2017-04-02 MED ORDER — VANCOMYCIN HCL IN DEXTROSE 1-5 GM/200ML-% IV SOLN
1000.0000 mg | Freq: Once | INTRAVENOUS | Status: AC
  Administered 2017-04-02: 1000 mg via INTRAVENOUS
  Filled 2017-04-02: qty 200

## 2017-04-02 MED ORDER — LEVOFLOXACIN IN D5W 750 MG/150ML IV SOLN
750.0000 mg | Freq: Once | INTRAVENOUS | Status: AC
  Administered 2017-04-02: 750 mg via INTRAVENOUS
  Filled 2017-04-02: qty 150

## 2017-04-02 MED ORDER — HYDRALAZINE HCL 20 MG/ML IJ SOLN: 10.0000 mg | INTRAMUSCULAR | Status: DC | PRN

## 2017-04-02 MED ORDER — FENTANYL CITRATE (PF) 100 MCG/2ML IJ SOLN
50.0000 ug | INTRAMUSCULAR | Status: DC | PRN
  Administered 2017-04-04 – 2017-04-05 (×7): 50 ug via INTRAVENOUS
  Filled 2017-04-02 (×7): qty 2

## 2017-04-02 MED ORDER — ATORVASTATIN CALCIUM 40 MG PO TABS
40.0000 mg | ORAL_TABLET | Freq: Every day | ORAL | Status: DC
  Administered 2017-04-02 – 2017-04-08 (×7): 40 mg via ORAL
  Filled 2017-04-02 (×7): qty 1

## 2017-04-02 MED ORDER — PANTOPRAZOLE SODIUM 40 MG PO TBEC
40.0000 mg | DELAYED_RELEASE_TABLET | Freq: Every day | ORAL | Status: DC
  Administered 2017-04-02 – 2017-04-08 (×7): 40 mg via ORAL
  Filled 2017-04-02 (×7): qty 1

## 2017-04-02 MED ORDER — ONDANSETRON HCL 4 MG/2ML IJ SOLN
4.0000 mg | Freq: Four times a day (QID) | INTRAMUSCULAR | Status: DC | PRN
  Administered 2017-04-02 – 2017-04-07 (×9): 4 mg via INTRAVENOUS
  Filled 2017-04-02 (×9): qty 2

## 2017-04-02 MED ORDER — DIPHENHYDRAMINE HCL 12.5 MG/5ML PO ELIX: 12.5000 mg | ORAL_SOLUTION | Freq: Four times a day (QID) | ORAL | Status: DC | PRN

## 2017-04-02 MED ORDER — LOSARTAN POTASSIUM 25 MG PO TABS
25.0000 mg | ORAL_TABLET | Freq: Every day | ORAL | Status: DC
  Administered 2017-04-02 – 2017-04-08 (×7): 25 mg via ORAL
  Filled 2017-04-02 (×8): qty 1

## 2017-04-02 MED ORDER — FENTANYL CITRATE (PF) 100 MCG/2ML IJ SOLN: 50.0000 ug | Freq: Once | INTRAMUSCULAR | Status: DC | PRN

## 2017-04-02 MED ORDER — IOPAMIDOL (ISOVUE-300) INJECTION 61%
INTRAVENOUS | Status: AC
  Administered 2017-04-02: 100 mL via INTRAVENOUS
  Filled 2017-04-02: qty 100

## 2017-04-02 MED ORDER — SODIUM CHLORIDE 0.9 % IV SOLN
2.0000 g | Freq: Once | INTRAVENOUS | Status: AC
  Administered 2017-04-02: 2 g via INTRAVENOUS
  Filled 2017-04-02: qty 2

## 2017-04-02 MED ORDER — ACETAMINOPHEN 325 MG PO TABS
650.0000 mg | ORAL_TABLET | Freq: Four times a day (QID) | ORAL | Status: DC | PRN
  Administered 2017-04-03 – 2017-04-04 (×3): 650 mg via ORAL
  Filled 2017-04-02 (×3): qty 2

## 2017-04-02 MED ORDER — DIPHENHYDRAMINE HCL 50 MG/ML IJ SOLN: 12.5000 mg | Freq: Four times a day (QID) | INTRAMUSCULAR | Status: DC | PRN

## 2017-04-02 MED ORDER — DEXTROSE-NACL 5-0.9 % IV SOLN
INTRAVENOUS | Status: DC
  Administered 2017-04-02 – 2017-04-03 (×2): via INTRAVENOUS

## 2017-04-02 MED ORDER — INSULIN ASPART 100 UNIT/ML ~~LOC~~ SOLN
0.0000 [IU] | SUBCUTANEOUS | Status: DC
  Administered 2017-04-03 – 2017-04-05 (×8): 2 [IU] via SUBCUTANEOUS

## 2017-04-02 MED ORDER — SIMETHICONE 80 MG PO CHEW
40.0000 mg | CHEWABLE_TABLET | Freq: Four times a day (QID) | ORAL | Status: DC | PRN
  Administered 2017-04-03 – 2017-04-06 (×4): 40 mg via ORAL
  Filled 2017-04-02 (×4): qty 1

## 2017-04-02 MED ORDER — ACETAMINOPHEN 500 MG PO TABS
1000.0000 mg | ORAL_TABLET | Freq: Once | ORAL | Status: AC
Start: 2017-04-02 — End: 2017-04-02
  Administered 2017-04-02: 1000 mg via ORAL
  Filled 2017-04-02: qty 2

## 2017-04-02 MED ORDER — CIPROFLOXACIN IN D5W 400 MG/200ML IV SOLN
400.0000 mg | Freq: Two times a day (BID) | INTRAVENOUS | Status: DC
  Administered 2017-04-02 – 2017-04-08 (×12): 400 mg via INTRAVENOUS
  Filled 2017-04-02 (×13): qty 200

## 2017-04-02 MED ORDER — SODIUM CHLORIDE 0.9 % IV BOLUS (SEPSIS)
1000.0000 mL | Freq: Once | INTRAVENOUS | Status: AC
  Administered 2017-04-02: 1000 mL via INTRAVENOUS

## 2017-04-02 MED ORDER — PROMETHAZINE HCL 25 MG/ML IJ SOLN
25.0000 mg | Freq: Once | INTRAMUSCULAR | Status: AC
  Administered 2017-04-02: 25 mg via INTRAMUSCULAR

## 2017-04-02 MED ORDER — ONDANSETRON HCL 4 MG/2ML IJ SOLN
4.0000 mg | Freq: Once | INTRAMUSCULAR | Status: AC | PRN
  Administered 2017-04-02: 4 mg via INTRAVENOUS
  Filled 2017-04-02: qty 2

## 2017-04-02 MED ORDER — ONDANSETRON 4 MG PO TBDP: 4.0000 mg | ORAL_TABLET | Freq: Four times a day (QID) | ORAL | Status: DC | PRN

## 2017-04-02 MED ORDER — METHOCARBAMOL 500 MG PO TABS
500.0000 mg | ORAL_TABLET | Freq: Four times a day (QID) | ORAL | Status: DC | PRN
  Administered 2017-04-04: 500 mg via ORAL
  Filled 2017-04-02: qty 1

## 2017-04-02 MED ORDER — ENOXAPARIN SODIUM 40 MG/0.4ML ~~LOC~~ SOLN
40.0000 mg | Freq: Every day | SUBCUTANEOUS | Status: DC
  Administered 2017-04-03: 40 mg via SUBCUTANEOUS
  Filled 2017-04-02: qty 0.4

## 2017-04-02 MED ORDER — ZOLPIDEM TARTRATE 5 MG PO TABS: 5.0000 mg | ORAL_TABLET | Freq: Every evening | ORAL | Status: DC | PRN

## 2017-04-02 NOTE — Progress Notes (Signed)
A consult was received from an ED physician for vancomycin, aztreonam, levaquin per pharmacy dosing.  The patient's profile has been reviewed for ht/wt/allergies/indication/available labs.    A one time order has been placed for vancomycin 1gm, aztreonam 2gm, levaquin 750 mg IV x1  Further antibiotics/pharmacy consults should be ordered by admitting physician if indicated.                       Thank you, Lynelle Doctor 04/02/2017  2:09 PM

## 2017-04-02 NOTE — ED Notes (Signed)
Patient denies pain and is resting comfortably.  

## 2017-04-02 NOTE — ED Notes (Signed)
Coming from UC in [place] having RUQ pain s/p chole-states possible sepsis-coming to ED for eval

## 2017-04-02 NOTE — ED Triage Notes (Addendum)
Patient had a cholecystectomy on 03/09/17. Patient had a JP drain removed on 03/22/17. Patient reports that she has had N/V, fever since last night and pain after the JP drain was removed. Patient went to see the surgeon today and was told to come to the ED since she was having a fever.

## 2017-04-02 NOTE — ED Notes (Signed)
Patient transported to CT 

## 2017-04-02 NOTE — Progress Notes (Signed)
HPI: Whitney Neal is a 57 y.o. female who  has a past medical history of Atypical chest pain, Barrett's esophagus, Cervical lymphadenopathy, Cervical strain, Constipation, GERD (gastroesophageal reflux disease), Hypercholesteremia, Hyperglycemia, Hyperplastic colonic polyp, Hypertension, Migraine, Osteopenia, PONV (postoperative nausea and vomiting), Ulcer (2010), Urinary incontinence, and Vitamin D deficiency.  she presents to [facility] today, 04/02/17,  for chief complaint of: Nausea and Vomiting, Abd pain   Prolonged hospitalization mid-late January few weeks ago for cholecystitis status post cholecystectomy 03/09/2017. Also had to have 2 ERCP procedures performed. She was doing relatively okay post discharge, other than constipation and persistent nausea/vomiting. She had her follow-up yesterday with her surgeon who recommended treating for constipation but over the course of yesterday she started to feel worse. Now complains of worsening pain in the right upper quadrant and lower quadrant where her drain was recently removed, fever last night.    Past medical, surgical, social and family history reviewed:  Patient Active Problem List   Diagnosis Date Noted  . Bile leak   . Acute on chronic cholecystitis s/p lap cholecystectomy 03/09/2017 03/09/2017  . 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 Constipation   . Hemangioma of liver 02/04/2017  . Elevated fasting glucose 05/23/2016  . Cardiac risk counseling 05/23/2016  . Migraine 10/01/2011  . Numbness and tingling in left hand 01/05/2011  . Hyperglycemia   . Essential hypertension 01/25/2010  . GERD 01/25/2010  . COLONIC POLYPS, HYPERPLASTIC, HX OF 01/25/2010  . CERVICAL STRAIN 11/23/2009  . Hyperlipidemia 09/26/2009  . OSTEOPENIA 07/11/2009  . GASTRIC ULCER, HX OF 07/11/2009  . History of Barrett's esophagus 07/11/2009  . Vitamin D deficiency 07/04/2009   . CHEST PAIN, ATYPICAL, HX OF 07/04/2009    Past Surgical History:  Procedure Laterality Date  . ABDOMINAL HYSTERECTOMY  2009   Dr. Bethann Goo - benign disease  . BLADDER SURGERY     bladder mesh surgery 2010- Dr. Rhodia Albright  . BREAST CYST ASPIRATION    . COLONOSCOPY W/ POLYPECTOMY  2010  . ERCP N/A 03/13/2017   Procedure: ENDOSCOPIC RETROGRADE CHOLANGIOPANCREATOGRAPHY (ERCP);  Surgeon: Carol Ada, MD;  Location: [facility];  Service: Endoscopy;  Laterality: N/A;  . ERCP N/A 03/14/2017   Procedure: ENDOSCOPIC RETROGRADE CHOLANGIOPANCREATOGRAPHY (ERCP);  Surgeon: Ladene Artist, MD;  Location: [facility];  Service: Endoscopy;  Laterality: N/A;  . LAPAROSCOPIC CHOLECYSTECTOMY SINGLE PORT N/A 03/09/2017   Procedure: LAPAROSCOPIC CHOLECYSTECTOMY WITH INTRAOPERATIVE CHOLANGIOGRAM, CLOSURE OF ABERRANT DUCT OF LUSCHKA;  Surgeon: Michael Boston, MD;  Location: [facility];  Service: General;  Laterality: N/A;    Social History   Tobacco Use  . Smoking status: Never Smoker  . Smokeless tobacco: Never Used  Substance Use Topics  . Alcohol use: No    Family History  Problem Relation Age of Onset  . Colon cancer Father   . Hypertension Father   . Heart attack Father 2  . Cancer Father        prostate CA  . Pulmonary embolism Mother        deceased  . Hypertension Mother   . Diabetes Sister   . Hypertension Brother   . Hypertension Sister   . Cancer Paternal Grandmother        oral  . Thyroid disease Sister        1/2 sister thyroid problem  . Healthy Daughter   . Heart attack Brother 6  . Breast cancer Cousin  Current medication list and allergy/intolerance information reviewed:    Current Outpatient Medications  Medication Sig Dispense Refill  . aspirin 81 MG EC tablet Take 81 mg by mouth daily.      Marland Kitchen atorvastatin (LIPITOR) 40 MG tablet Take 1 tablet (40 mg total) by mouth daily. 90 tablet 3  . docusate sodium (COLACE) 100 MG capsule Take 100 mg by mouth daily as needed for  mild constipation.    . fluticasone (FLONASE) 50 MCG/ACT nasal spray Place 1 spray into both nostrils daily as needed for allergies or rhinitis.    Marland Kitchen losartan (COZAAR) 25 MG tablet Take 1 tablet (25 mg total) daily by mouth. 90 tablet 3  . metFORMIN (GLUCOPHAGE XR) 500 MG 24 hr tablet Take 1 tablet (500 mg total) by mouth daily with breakfast. 90 tablet 3  . omeprazole (PRILOSEC) 40 MG capsule Take 1 capsule (40 mg total) by mouth daily. 30 capsule 3  . oxyCODONE (OXY IR/ROXICODONE) 5 MG immediate release tablet Take 1 tablet (5 mg total) by mouth every 4 (four) hours as needed for severe pain or breakthrough pain. 20 tablet 0  . simethicone (MYLICON) 998 MG chewable tablet Chew 125 mg by mouth every 6 (six) hours as needed for flatulence.    . sodium bicarbonate 325 MG tablet Take 1-2 tablets (325-650 mg total) by mouth 2 (two) times daily. Prn heartburn 180 tablet 11  . sucralfate (CARAFATE) 1 GM/10ML suspension Take 10 mLs (1 g total) 4 (four) times daily -  with meals and at bedtime by mouth. 420 mL 1   No current facility-administered medications for this visit.     Allergies  Allergen Reactions  . Codeine     REACTION: headache, visual disturbance  . Morphine And Related   . Rocephin [Ceftriaxone Sodium In Dextrose] Rash      Review of Systems:  Constitutional:  +fever up to 101 last night, afebrile now, last iburpfen last night, + chills, No recent illness, No unintentional weight changes. No significant fatigue.   HEENT: No  headache, no vision change  Cardiac: No  chest pain, No  pressure, No palpitations, No  Orthopnea, no lower extremity swelling  Respiratory:  No  shortness of breath. No  Cough  Gastrointestinal: +abdominal pain, +nausea, No  vomiting,  +blood in stool, No  diarrhea, +constipation   Musculoskeletal: No new myalgia/arthralgia  Skin: No  Rash,  Neurologic: +generalied weakness, No  dizziness   Exam:  BP 119/79   Pulse (!) 130   Temp 98.7 F (37.1  C) (Oral)   Wt 140 lb 1.3 oz (63.5 kg)   BMI 27.36 kg/m   Constitutional: VS see above. General Appearance: alert, well-developed, well-nourished, Mild distress  Eyes: Normal lids and conjunctive, non-icteric sclera  Ears, Nose, Mouth, Throat: MMM, Normal external inspection ears/nares/mouth/lips/gums. .   Neck: No masses, trachea midline. No thyroid enlargement. No tenderness/mass appreciated. No lymphadenopathy  Respiratory: Normal respiratory effort. no wheeze, no rhonchi, no rales  Cardiovascular: S1/S2 normal, no murmur, no rub/gallop auscultated. Tachycardic w/ reg rhythm. No lower extremity edema.   Gastrointestinal: (+)TTP RUQ, RLQ, no masses. No hepatomegaly, no splenomegaly. No hernia appreciated. Bowel sounds normal. Rectal exam deferred.   Musculoskeletal: Gait normal. No clubbing/cyanosis of digits.   Neurological: Normal balance/coordination. No tremor.  Skin: warm, intact, slightly sweaty  Psychiatric: Normal judgment/insight. Normal mood and affect. Oriented x3.     ASSESSMENT/PLAN:   SIRS (systemic inflammatory response syndrome) (HCC) - posisble sepsis, concern for postop  complication/infection, would also consider PE though this seems less likely. Recommend ER evaluation ASAP, called [facility]  Acute on chronic cholecystitis s/p lap cholecystectomy 03/09/2017 - Plan: promethazine (PHENERGAN) injection 25 mg  Laceration of bile duct, subsequent encounter - Plan: promethazine (PHENERGAN) injection 25 mg    Meds ordered this encounter  Medications  . promethazine (PHENERGAN) injection 25 mg     Visit summary with medication list and pertinent instructions was printed for patient to review. All questions at time of visit were answered - patient instructed to contact office with any additional concerns. ER/RTC precautions were reviewed with the patient.   Follow-up plan: Return for recheck depending on how thigs go in the hospital .  Note: Total time spent 25  minutes, greater than 50% of the visit was spent face-to-face counseling and coordinating care for the following: The primary encounter diagnosis was SIRS (systemic inflammatory response syndrome) ([place]). Diagnoses of Acute on chronic cholecystitis s/p lap cholecystectomy 03/09/2017 and Laceration of bile duct, subsequent encounter were also pertinent to this visit.Marland Kitchen  Please note: voice recognition software was used to produce this document, and typos may escape review. Please contact Dr. Sheppard Coil for any needed clarifications.

## 2017-04-02 NOTE — ED Provider Notes (Signed)
[facility] DEPT Provider Note   CSN: [id number] Arrival date & time: 04/02/17  1207     History   Chief Complaint Chief Complaint  Patient presents with  . Abdominal Pain    HPI Whitney Neal is a 57 y.o. female.  The history is provided by the patient and medical records. No language interpreter was used.  Abdominal Pain   Associated symptoms include fever, nausea and vomiting. Pertinent negatives include diarrhea and constipation.   Whitney Neal is a 57 y.o. female  with a PMH of cholecystitis s/p cholecystectomy 03/09/2017 who presents to the Emergency Department complaining of upper abdominal pain associated with nausea and vomiting.  Patient reports seeing Dr. gross, her surgeon, yesterday.  At that time, she was feeling a little nauseous, but otherwise had no complaints.  About 5 AM this morning, she developed a fever of 101 and began vomiting.  Has been reports that she was throwing up about every 15-20 minutes for about 4 hours, then symptoms somewhat improved.  She was able to take an ibuprofen around 9 AM and went to her primary care doctor appointment this morning.  She was given Phenergan IM injection at the office which has helped with her nausea and vomiting.  PCP noticed elevated heart rate in the 120s and recommended that she come to ER for septic workup with concerns for postop complication or infection.   Past Medical History:  Diagnosis Date  . Atypical chest pain   . Barrett's esophagus    history of.  Normal EGD 2011, 2014  . Cervical lymphadenopathy    left  . Cervical strain   . Constipation   . GERD (gastroesophageal reflux disease)   . Hypercholesteremia   . Hyperglycemia    a1c 6.3 7/12  . Hyperplastic colonic polyp    history of   . Hypertension   . Migraine   . Osteopenia   . PONV (postoperative nausea and vomiting)   . Ulcer 2010  . Urinary incontinence   . Vitamin D deficiency     Patient Active  Problem List   Diagnosis Date Noted  . Bile leak   . Acute on chronic cholecystitis s/p lap cholecystectomy 03/09/2017 03/09/2017  . 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 Constipation   . Hemangioma of liver 02/04/2017  . Elevated fasting glucose 05/23/2016  . Cardiac risk counseling 05/23/2016  . Migraine 10/01/2011  . Numbness and tingling in left hand 01/05/2011  . Hyperglycemia   . Essential hypertension 01/25/2010  . GERD 01/25/2010  . COLONIC POLYPS, HYPERPLASTIC, HX OF 01/25/2010  . CERVICAL STRAIN 11/23/2009  . Hyperlipidemia 09/26/2009  . OSTEOPENIA 07/11/2009  . GASTRIC ULCER, HX OF 07/11/2009  . History of Barrett's esophagus 07/11/2009  . Vitamin D deficiency 07/04/2009  . CHEST PAIN, ATYPICAL, HX OF 07/04/2009    Past Surgical History:  Procedure Laterality Date  . ABDOMINAL HYSTERECTOMY  2009   Dr. Bethann Goo - benign disease  . BLADDER SURGERY     bladder mesh surgery 2010- Dr. Rhodia Albright  . BREAST CYST ASPIRATION    . COLONOSCOPY W/ POLYPECTOMY  2010  . ERCP N/A 03/13/2017   Procedure: ENDOSCOPIC RETROGRADE CHOLANGIOPANCREATOGRAPHY (ERCP);  Surgeon: Carol Ada, MD;  Location: [facility];  Service: Endoscopy;  Laterality: N/A;  . ERCP N/A 03/14/2017   Procedure: ENDOSCOPIC RETROGRADE CHOLANGIOPANCREATOGRAPHY (ERCP);  Surgeon: Ladene Artist, MD;  Location: [facility];  Service: Endoscopy;  Laterality: N/A;  .  LAPAROSCOPIC CHOLECYSTECTOMY SINGLE PORT N/A 03/09/2017   Procedure: LAPAROSCOPIC CHOLECYSTECTOMY WITH INTRAOPERATIVE CHOLANGIOGRAM, CLOSURE OF ABERRANT DUCT OF LUSCHKA;  Surgeon: Michael Boston, MD;  Location: [facility];  Service: General;  Laterality: N/A;    OB History    No data available       Home Medications    Prior to Admission medications   Medication Sig Start Date End Date Taking? Authorizing Provider  aspirin 81 MG EC tablet Take 81 mg by mouth daily.     Yes [provider]  atorvastatin  (LIPITOR) 40 MG tablet Take 1 tablet (40 mg total) by mouth daily. 02/08/16  Yes Emeterio Reeve, DO  docusate sodium (COLACE) 100 MG capsule Take 100 mg by mouth daily as needed for mild constipation.   Yes [provider]  fluticasone (FLONASE) 50 MCG/ACT nasal spray Place 1 spray into both nostrils daily as needed for allergies or rhinitis.   Yes [provider]  losartan (COZAAR) 25 MG tablet Take 1 tablet (25 mg total) daily by mouth. 12/27/16  Yes Emeterio Reeve, DO  metFORMIN (GLUCOPHAGE XR) 500 MG 24 hr tablet Take 1 tablet (500 mg total) by mouth daily with breakfast. 08/23/16 08/23/17 Yes Emeterio Reeve, DO  methylPREDNISolone (MEDROL) 4 MG tablet Take 4 mg by mouth. TAKE AS DIRECTED ON PACKAGE 03/28/17  Yes [provider]  Nutritional Supplements (BOOST PO) Take 1 Can by mouth daily.   Yes [provider]  omeprazole (PRILOSEC) 40 MG capsule Take 1 capsule (40 mg total) by mouth daily. 08/27/16  Yes Emeterio Reeve, DO  oxyCODONE (OXY IR/ROXICODONE) 5 MG immediate release tablet Take 1 tablet (5 mg total) by mouth every 4 (four) hours as needed for severe pain or breakthrough pain. 03/15/17  Yes Meuth, Brooke A, PA-C  sodium bicarbonate 325 MG tablet Take 1-2 tablets (325-650 mg total) by mouth 2 (two) times daily. Prn heartburn Patient not taking: Reported on 04/02/2017 08/27/16   Emeterio Reeve, DO  sucralfate (CARAFATE) 1 GM/10ML suspension Take 10 mLs (1 g total) 4 (four) times daily -  with meals and at bedtime by mouth. Patient not taking: Reported on 04/02/2017 12/28/16   Emeterio Reeve, DO    Family History Family History  Problem Relation Age of Onset  . Colon cancer Father   . Hypertension Father   . Heart attack Father 45  . Cancer Father        prostate CA  . Pulmonary embolism Mother        deceased  . Hypertension Mother   . Diabetes Sister   . Hypertension Brother   . Hypertension Sister   . Cancer Paternal Grandmother         oral  . Thyroid disease Sister        1/2 sister thyroid problem  . Healthy Daughter   . Heart attack Brother 53  . Breast cancer Cousin     Social History Social History   Tobacco Use  . Smoking status: Never Smoker  . Smokeless tobacco: Never Used  Substance Use Topics  . Alcohol use: No  . Drug use: No     Allergies   Codeine; Morphine and related; and Rocephin [ceftriaxone sodium in dextrose]   Review of Systems Review of Systems  Constitutional: Positive for chills and fever.  Gastrointestinal: Positive for abdominal pain, nausea and vomiting. Negative for constipation and diarrhea.  All other systems reviewed and are negative.    Physical Exam Updated Vital Signs BP 121/76  Pulse 98   Temp (!) 102 F (38.9 C) (Rectal)   Resp 20   Ht 5' (1.524 m)   Wt 63.5 kg (140 lb)   SpO2 99%   BMI 27.34 kg/m   Physical Exam  Constitutional: She is oriented to person, place, and time. She appears well-developed and well-nourished. No distress.  HENT:  Head: Normocephalic and atraumatic.  Neck: Neck supple.  Cardiovascular: Normal heart sounds.  No murmur heard. Tachycardic but regular.  Pulmonary/Chest: Effort normal and breath sounds normal. No respiratory distress.  Abdominal: Soft. Bowel sounds are normal. She exhibits no distension.  Upper abdominal tenderness. No rebound or guarding.  Neurological: She is alert and oriented to person, place, and time.  Skin: Skin is warm and dry.  Nursing note and vitals reviewed.   ED Treatments / Results  Labs (all labs ordered are listed, but only abnormal results are displayed) Labs Reviewed  CBC WITH DIFFERENTIAL/PLATELET - Abnormal; Notable for the following components:      Result Value   WBC 11.1 (*)    Hemoglobin 11.6 (*)    HCT 34.1 (*)    Platelets 587 (*)    Neutro Abs 9.4 (*)    All other components within normal limits  COMPREHENSIVE METABOLIC PANEL - Abnormal; Notable for the following  components:   Chloride 100 (*)    CO2 20 (*)    Glucose, Bld 123 (*)    Total Protein 8.6 (*)    Albumin 3.0 (*)    All other components within normal limits  URINALYSIS, ROUTINE W REFLEX MICROSCOPIC - Abnormal; Notable for the following components:   Color, Urine STRAW (*)    Specific Gravity, Urine >1.046 (*)    Ketones, ur 20 (*)    All other components within normal limits  CULTURE, BLOOD (ROUTINE X 2)  CULTURE, BLOOD (ROUTINE X 2)  LIPASE, BLOOD  I-STAT CG4 LACTIC ACID, ED    EKG  EKG Interpretation  Date/Time:  Tuesday April 02 2017 14:27:02 EST Ventricular Rate:  108 PR Interval:    QRS Duration: 98 QT Interval:  332 QTC Calculation: 445 R Axis:   41 Text Interpretation:  Sinus tachycardia Low voltage, precordial leads Confirmed by Isla Pence [phone]) on 04/02/2017 2:29:40 PM Also confirmed by Isla Pence [phone]), editor Lynder Parents [phone])  on 04/02/2017 3:17:25 PM       Radiology Dg Chest 2 View  Result Date: 04/02/2017 CLINICAL DATA:  Fever.  Recent drain removal. EXAM: CHEST  2 VIEW COMPARISON:  One-view chest x-ray 03/14/2017 FINDINGS: Heart size is normal. Lung volumes are low. Bibasilar airspace disease likely reflects atelectasis. The visualized soft tissues and bony thorax are unremarkable. IMPRESSION: Low lung volumes with bibasilar airspace disease, likely reflecting atelectasis. Electronically Signed   By: San Morelle M.D.   On: 04/02/2017 15:30   Ct Abdomen Pelvis W Contrast  Result Date: 04/02/2017 CLINICAL DATA:  Status post cholecystectomy on 03/09/2017, with nausea/vomiting and fever status post JP drain removal on 03/22/2017. EXAM: CT ABDOMEN AND PELVIS WITH CONTRAST TECHNIQUE: Multidetector CT imaging of the abdomen and pelvis was performed using the standard protocol following bolus administration of intravenous contrast. CONTRAST:  100 mL Isovue 300 IV COMPARISON:  None. FINDINGS: Lower chest: Mild patchy opacities in the  right lower lobe (series 7/image 19), likely atelectasis, pneumonia not excluded. Hepatobiliary: No focal hepatic lesions. Post cholecystectomy. Complex fluid collection along the inferior aspect of the liver/gallbladder fossa, with 3 distinct components, measuring 5.4 x 6.2  x 4.3 cm in aggregate. Dominant component along the posterior gallbladder fossa measures 2.8 cm (series 2/image 22). Dominant component along the anterior/lateral gallbladder fossa measures 2.7 cm (series 2/image 25). Dominant component medially in the gallbladder fossa extends along the wall of the gastric antrum, where it measures 3.8 cm (series 2/image 27). No intrahepatic or extrahepatic ductal dilatation. Pancreas: Within normal limits. Spleen: Within normal limits. Adrenals/Urinary Tract: Adrenal glands are within normal limits. Kidneys are within normal limits.  No hydronephrosis. Bladder is within normal limits, noting a tiny focus of nondependent gas. Stomach/Bowel: Stomach is within normal limits. Moderate duodenal diverticulum. No evidence of bowel obstruction. Normal appendix (series 2/image 65). Vascular/Lymphatic: No evidence of abdominal aortic aneurysm. No suspicious abdominopelvic lymphadenopathy. Reproductive: Status post hysterectomy. No adnexal masses. Other: No abdominopelvic ascites. Mild postsurgical changes along the anterior abdominal wall. Musculoskeletal: Visualized osseous structures are within normal limits. IMPRESSION: Status post cholecystectomy. Complex fluid collections along the gallbladder fossa, as described above, measuring 5.4 x 6.2 x 4.3 cm in aggregate. These collections extend along the inferior aspect of the liver and into the wall of the gastric antrum. Presumably, this appearance is related to the patient's history of bile leak, although superimposed infection/abscess is not excluded. Mild patchy right lower lobe opacities, likely atelectasis, pneumonia not excluded. Electronically Signed   By: Julian Hy M.D.   On: 04/02/2017 15:54    Procedures Procedures (including critical care time)  CRITICAL CARE Performed by: Ozella Almond Ward  Total critical care time: 35 minutes  Critical care time was exclusive of separately billable procedures and treating other patients.  Critical care was necessary to treat or prevent imminent or life-threatening deterioration.  Critical care was time spent personally by me on the following activities: development of treatment plan with patient and/or surrogate as well as nursing, discussions with consultants, evaluation of patient's response to treatment, examination of patient, obtaining history from patient or surrogate, ordering and performing treatments and interventions, ordering and review of laboratory studies, ordering and review of radiographic studies, pulse oximetry and re-evaluation of patient's condition.   Medications Ordered in ED Medications  fentaNYL (SUBLIMAZE) injection 50 mcg (not administered)  acetaminophen (TYLENOL) tablet 1,000 mg (not administered)  sodium chloride 0.9 % bolus 1,000 mL (0 mLs Intravenous Stopped 04/02/17 1449)  ondansetron (ZOFRAN) injection 4 mg (4 mg Intravenous Given 04/02/17 1404)  sodium chloride 0.9 % bolus 1,000 mL (0 mLs Intravenous Stopped 04/02/17 1731)  levofloxacin (LEVAQUIN) IVPB 750 mg (0 mg Intravenous Stopped 04/02/17 1550)  aztreonam (AZACTAM) 2 g in sodium chloride 0.9 % 100 mL IVPB (0 g Intravenous Stopped 04/02/17 1600)  vancomycin (VANCOCIN) IVPB 1000 mg/200 mL premix (1,000 mg Intravenous New Bag/Given 04/02/17 1530)  iopamidol (ISOVUE-300) 61 % injection (100 mLs Intravenous Contrast Given 04/02/17 1521)     Initial Impression / Assessment and Plan / ED Course  I have reviewed the triage vital signs and the nursing notes.  Pertinent labs & imaging results that were available during my care of the patient were reviewed by me and considered in my medical decision making (see chart for  details).    BRAYLIN FORMBY is a 57 y.o. female with PMH of acute on chronic cholecystitis who presents to ED for fever, abdominal pain, n/v which began this morning. On exam, patient febrile to 103.7 and tachycardic. White count of 11.1. Code sepsis initiated. Blood cx's obtained and patient started on broad spectrum ABX and weight-based fluids.  Lactic wdl.   Sepsis  recheck completed.  Patient reevaluated with no complaints.  Heart rate normalized.   Spoke with general surgery, Dr. Brantley Stage, who will come evaluate patient and admit.  Final Clinical Impressions(s) / ED Diagnoses   Final diagnoses:  Fever  SIRS (systemic inflammatory response syndrome) [facility])    ED Discharge Orders    None       Ward, Ozella Almond, PA-C 04/02/17 1756    Isla Pence, MD 04/03/17 [phone]

## 2017-04-02 NOTE — ED Notes (Signed)
ED Provider at bedside. 

## 2017-04-02 NOTE — H&P (Signed)
Whitney Neal is an 57 y.o. female.   Chief Complaint: Nausea vomiting HPI: Patient is 3 weeks out from an emergent laparoscopic cholecystectomy by Dr. gross for acute cholecystitis.  A JP drain was in place up until 1 February.  She was seen yesterday by Dr. gross and was doing okay except for some mild nausea.  She woke up this morning with more nausea and vomiting.  She also complains of constipation.  CT scan was done by the emergency room physician which shows a complex fluid collection in the gallbladder fossa.  She is also having fever 202 degrees.  Patient denies any cough or runny nose.  Patient denies any exposure to anyone with the flu or gastroenteritis.  Patient denies severe abdominal pain.  Patient's postoperative course was complicated with a duct of Luscka that was ligated intraoperatively.  She underwent 2 postoperative ERCPs 1 by Dr. Almyra Free the second by Dr. Fuller Plan.  The first ERCP was a simple sphincterotomy the second 1 was done to place a stent.  She was recovering well.  Past Medical History:  Diagnosis Date  . Atypical chest pain   . Barrett's esophagus    history of.  Normal EGD 2011, 2014  . Cervical lymphadenopathy    left  . Cervical strain   . Constipation   . GERD (gastroesophageal reflux disease)   . Hypercholesteremia   . Hyperglycemia    a1c 6.3 7/12  . Hyperplastic colonic polyp    history of   . Hypertension   . Migraine   . Osteopenia   . PONV (postoperative nausea and vomiting)   . Ulcer 2010  . Urinary incontinence   . Vitamin D deficiency     Past Surgical History:  Procedure Laterality Date  . ABDOMINAL HYSTERECTOMY  2009   Dr. Bethann Goo - benign disease  . BLADDER SURGERY     bladder mesh surgery 2010- Dr. Rhodia Albright  . BREAST CYST ASPIRATION    . COLONOSCOPY W/ POLYPECTOMY  2010  . ERCP N/A 03/13/2017   Procedure: ENDOSCOPIC RETROGRADE CHOLANGIOPANCREATOGRAPHY (ERCP);  Surgeon: Carol Ada, MD;  Location: [facility];  Service: Endoscopy;   Laterality: N/A;  . ERCP N/A 03/14/2017   Procedure: ENDOSCOPIC RETROGRADE CHOLANGIOPANCREATOGRAPHY (ERCP);  Surgeon: Ladene Artist, MD;  Location: [facility];  Service: Endoscopy;  Laterality: N/A;  . LAPAROSCOPIC CHOLECYSTECTOMY SINGLE PORT N/A 03/09/2017   Procedure: LAPAROSCOPIC CHOLECYSTECTOMY WITH INTRAOPERATIVE CHOLANGIOGRAM, CLOSURE OF ABERRANT DUCT OF LUSCHKA;  Surgeon: Michael Boston, MD;  Location: [facility];  Service: General;  Laterality: N/A;    Family History  Problem Relation Age of Onset  . Colon cancer Father   . Hypertension Father   . Heart attack Father 76  . Cancer Father        prostate CA  . Pulmonary embolism Mother        deceased  . Hypertension Mother   . Diabetes Sister   . Hypertension Brother   . Hypertension Sister   . Cancer Paternal Grandmother        oral  . Thyroid disease Sister        1/2 sister thyroid problem  . Healthy Daughter   . Heart attack Brother 94  . Breast cancer Cousin    Social History:  reports that  has never smoked. she has never used smokeless tobacco. She reports that she does not drink alcohol or use drugs.  Allergies:  Allergies  Allergen Reactions  . Codeine     REACTION: headache,  visual disturbance  . Morphine And Related   . Rocephin [Ceftriaxone Sodium In Dextrose] Rash     (Not in a hospital admission)  Results for orders placed or performed during the hospital encounter of 04/02/17 (from the past 48 hour(s))  CBC with Differential     Status: Abnormal   Collection Time: 04/02/17  1:37 PM  Result Value Ref Range   WBC 11.1 (H) 4.0 - 10.5 K/uL   RBC 4.12 3.87 - 5.11 MIL/uL   Hemoglobin 11.6 (L) 12.0 - 15.0 g/dL   HCT 34.1 (L) 36.0 - 46.0 %   MCV 82.8 78.0 - 100.0 fL   MCH 28.2 26.0 - 34.0 pg   MCHC 34.0 30.0 - 36.0 g/dL   RDW 13.1 11.5 - 15.5 %   Platelets 587 (H) 150 - 400 K/uL   Neutrophils Relative % 85 %   Neutro Abs 9.4 (H) 1.7 - 7.7 K/uL   Lymphocytes Relative 8 %   Lymphs Abs 0.9 0.7 - 4.0  K/uL   Monocytes Relative 7 %   Monocytes Absolute 0.8 0.1 - 1.0 K/uL   Eosinophils Relative 0 %   Eosinophils Absolute 0.0 0.0 - 0.7 K/uL   Basophils Relative 0 %   Basophils Absolute 0.0 0.0 - 0.1 K/uL    Comment: Performed at [facility], [place] [address] [place], [place] [address]  Comprehensive metabolic panel     Status: Abnormal   Collection Time: 04/02/17  1:37 PM  Result Value Ref Range   Sodium 135 135 - 145 mmol/L   Potassium 3.9 3.5 - 5.1 mmol/L   Chloride 100 (L) 101 - 111 mmol/L   CO2 20 (L) 22 - 32 mmol/L   Glucose, Bld 123 (H) 65 - 99 mg/dL   BUN 15 6 - 20 mg/dL   Creatinine, Ser 0.71 0.44 - 1.00 mg/dL   Calcium 9.4 8.9 - 10.3 mg/dL   Total Protein 8.6 (H) 6.5 - 8.1 g/dL   Albumin 3.0 (L) 3.5 - 5.0 g/dL   AST 31 15 - 41 U/L   ALT 24 14 - 54 U/L   Alkaline Phosphatase 126 38 - 126 U/L   Total Bilirubin 1.1 0.3 - 1.2 mg/dL   GFR calc non Af Amer >60 >60 mL/min   GFR calc Af Amer >60 >60 mL/min    Comment: (NOTE) The eGFR has been calculated using the CKD EPI equation. This calculation has not been validated in all clinical situations. eGFR's persistently <60 mL/min signify possible Chronic Kidney Disease.    Anion gap 15 5 - 15    Comment: Performed at [facility], [place] [address], [place], [place] [address]  Lipase, blood     Status: None   Collection Time: 04/02/17  1:37 PM  Result Value Ref Range   Lipase 31 11 - 51 U/L    Comment: Performed at [facility], [place] [address], [place], [place] [address]  I-Stat CG4 Lactic Acid, ED     Status: None   Collection Time: 04/02/17  1:53 PM  Result Value Ref Range   Lactic Acid, Venous 1.34 0.5 - 1.9 mmol/L  Urinalysis, Routine w reflex microscopic     Status: Abnormal   Collection Time: 04/02/17  4:22 PM  Result Value Ref Range   Color, Urine STRAW (A) YELLOW   APPearance CLEAR CLEAR   Specific Gravity, Urine >1.046 (H) 1.005 - 1.030   pH 7.0 5.0 - 8.0  Glucose, UA NEGATIVE NEGATIVE mg/dL   Hgb urine dipstick NEGATIVE NEGATIVE   Bilirubin Urine NEGATIVE NEGATIVE   Ketones, ur 20 (A) NEGATIVE mg/dL   Protein, ur NEGATIVE NEGATIVE mg/dL   Nitrite NEGATIVE NEGATIVE   Leukocytes, UA NEGATIVE NEGATIVE    Comment: Performed at [facility], [place] [address] [place], [place] [address]   Dg Chest 2 View  Result Date: 04/02/2017 CLINICAL DATA:  Fever.  Recent drain removal. EXAM: CHEST  2 VIEW COMPARISON:  One-view chest x-ray 03/14/2017 FINDINGS: Heart size is normal. Lung volumes are low. Bibasilar airspace disease likely reflects atelectasis. The visualized soft tissues and bony thorax are unremarkable. IMPRESSION: Low lung volumes with bibasilar airspace disease, likely reflecting atelectasis. Electronically Signed   By: San Morelle M.D.   On: 04/02/2017 15:30   Ct Abdomen Pelvis W Contrast  Result Date: 04/02/2017 CLINICAL DATA:  Status post cholecystectomy on 03/09/2017, with nausea/vomiting and fever status post JP drain removal on 03/22/2017. EXAM: CT ABDOMEN AND PELVIS WITH CONTRAST TECHNIQUE: Multidetector CT imaging of the abdomen and pelvis was performed using the standard protocol following bolus administration of intravenous contrast. CONTRAST:  100 mL Isovue 300 IV COMPARISON:  None. FINDINGS: Lower chest: Mild patchy opacities in the right lower lobe (series 7/image 19), likely atelectasis, pneumonia not excluded. Hepatobiliary: No focal hepatic lesions. Post cholecystectomy. Complex fluid collection along the inferior aspect of the liver/gallbladder fossa, with 3 distinct components, measuring 5.4 x 6.2 x 4.3 cm in aggregate. Dominant component along the posterior gallbladder fossa measures 2.8 cm (series 2/image 22). Dominant component along the anterior/lateral gallbladder fossa measures 2.7 cm (series 2/image 25). Dominant component medially in the gallbladder fossa extends along the wall of the gastric  antrum, where it measures 3.8 cm (series 2/image 27). No intrahepatic or extrahepatic ductal dilatation. Pancreas: Within normal limits. Spleen: Within normal limits. Adrenals/Urinary Tract: Adrenal glands are within normal limits. Kidneys are within normal limits.  No hydronephrosis. Bladder is within normal limits, noting a tiny focus of nondependent gas. Stomach/Bowel: Stomach is within normal limits. Moderate duodenal diverticulum. No evidence of bowel obstruction. Normal appendix (series 2/image 65). Vascular/Lymphatic: No evidence of abdominal aortic aneurysm. No suspicious abdominopelvic lymphadenopathy. Reproductive: Status post hysterectomy. No adnexal masses. Other: No abdominopelvic ascites. Mild postsurgical changes along the anterior abdominal wall. Musculoskeletal: Visualized osseous structures are within normal limits. IMPRESSION: Status post cholecystectomy. Complex fluid collections along the gallbladder fossa, as described above, measuring 5.4 x 6.2 x 4.3 cm in aggregate. These collections extend along the inferior aspect of the liver and into the wall of the gastric antrum. Presumably, this appearance is related to the patient's history of bile leak, although superimposed infection/abscess is not excluded. Mild patchy right lower lobe opacities, likely atelectasis, pneumonia not excluded. Electronically Signed   By: Julian Hy M.D.   On: 04/02/2017 15:54    Review of Systems  Constitutional: Positive for chills and fever.  HENT: Negative for hearing loss.   Eyes: Negative for pain.  Respiratory: Negative for cough and shortness of breath.   Cardiovascular: Negative for chest pain and palpitations.  Gastrointestinal: Positive for nausea and vomiting.  Genitourinary: Negative for dysuria.  Musculoskeletal: Negative for myalgias.  Skin: Negative for rash.  Neurological: Negative for dizziness and headaches.  Psychiatric/Behavioral: Negative for depression.    Blood pressure  121/76, pulse 98, temperature (!) 102 F (38.9 C), temperature source Rectal, resp. rate 20, height 5' (1.524 m), weight 63.5 kg (140 lb), SpO2 99 %.  Physical Exam  Constitutional: She is oriented to person, place, and time. She appears well-developed and well-nourished. No distress.  HENT:  Head: Normocephalic.  Eyes: Pupils are equal, round, and reactive to light. No scleral icterus.  Neck: Normal range of motion.  Cardiovascular: Normal rate and regular rhythm.  Slight tachycardia to 103.  Sinus rhythm.  Respiratory: Effort normal and breath sounds normal.  GI:  Soft nontender no rebound or guarding.  Incision clean dry and intact.  Musculoskeletal: Normal range of motion.  Neurological: She is alert and oriented to person, place, and time.  Skin: Skin is warm.     Assessment/Plan Nausea vomiting  Complex fluid collection gallbladder fossa status post cholecystectomy with duct of Luscka   2 postoperative ERCPs with stent placement  Patient shows no signs of peritonitis.  I doubt she has a bile leak at this point time.  She could be developing an abscess of the gallbladder fossa.  Will order HIDA study and will need IR consultation in a.m. for possible aspiration or drainage.  We will start her on empiric antibiotics.  We will place her on a sliding scale insulin for diabetes and allow clear liquids until midnight.  She will be n.p.o. after midnight for further testing and intervention in the morning.  Her IV fluids and repeat labs in a.m.  Care discussed with the patient and husband at bedside.  They are in agreement.  Turner Daniels, MD 04/02/2017, 7:40 PM

## 2017-04-03 ENCOUNTER — Inpatient Hospital Stay (HOSPITAL_COMMUNITY): Payer: 59

## 2017-04-03 LAB — CBC
HCT: 28.4 % — ABNORMAL LOW (ref 36.0–46.0)
Hemoglobin: 9.5 g/dL — ABNORMAL LOW (ref 12.0–15.0)
MCH: 28.1 pg (ref 26.0–34.0)
MCHC: 33.5 g/dL (ref 30.0–36.0)
MCV: 84 fL (ref 78.0–100.0)
Platelets: 472 10*3/uL — ABNORMAL HIGH (ref 150–400)
RBC: 3.38 MIL/uL — ABNORMAL LOW (ref 3.87–5.11)
RDW: 13.7 % (ref 11.5–15.5)
WBC: 9 10*3/uL (ref 4.0–10.5)

## 2017-04-03 LAB — GLUCOSE, CAPILLARY
Glucose-Capillary: 116 mg/dL — ABNORMAL HIGH (ref 65–99)
Glucose-Capillary: 120 mg/dL — ABNORMAL HIGH (ref 65–99)
Glucose-Capillary: 123 mg/dL — ABNORMAL HIGH (ref 65–99)
Glucose-Capillary: 125 mg/dL — ABNORMAL HIGH (ref 65–99)
Glucose-Capillary: 127 mg/dL — ABNORMAL HIGH (ref 65–99)
Glucose-Capillary: 127 mg/dL — ABNORMAL HIGH (ref 65–99)
Glucose-Capillary: 94 mg/dL (ref 65–99)

## 2017-04-03 LAB — COMPREHENSIVE METABOLIC PANEL
ALT: 20 U/L (ref 14–54)
AST: 29 U/L (ref 15–41)
Albumin: 2.5 g/dL — ABNORMAL LOW (ref 3.5–5.0)
Alkaline Phosphatase: 103 U/L (ref 38–126)
Anion gap: 10 (ref 5–15)
BUN: 10 mg/dL (ref 6–20)
CO2: 20 mmol/L — ABNORMAL LOW (ref 22–32)
Calcium: 8.5 mg/dL — ABNORMAL LOW (ref 8.9–10.3)
Chloride: 104 mmol/L (ref 101–111)
Creatinine, Ser: 0.72 mg/dL (ref 0.44–1.00)
GFR calc Af Amer: >60 mL/min (ref >60)
GFR calc non Af Amer: >60 mL/min (ref >60)
Glucose, Bld: 144 mg/dL — ABNORMAL HIGH (ref 65–99)
Potassium: 4 mmol/L (ref 3.5–5.1)
Sodium: 134 mmol/L — ABNORMAL LOW (ref 135–145)
Total Bilirubin: 0.5 mg/dL (ref 0.3–1.2)
Total Protein: 7 g/dL (ref 6.5–8.1)

## 2017-04-03 MED ORDER — TECHNETIUM TC 99M MEBROFENIN IV KIT
5.3000 | PACK | Freq: Once | INTRAVENOUS | Status: AC | PRN
  Administered 2017-04-03: 5.3 via INTRAVENOUS

## 2017-04-03 MED ORDER — PROMETHAZINE HCL 25 MG/ML IJ SOLN
12.5000 mg | Freq: Four times a day (QID) | INTRAMUSCULAR | Status: DC | PRN
  Administered 2017-04-03 – 2017-04-05 (×5): 12.5 mg via INTRAVENOUS
  Filled 2017-04-03 (×5): qty 1

## 2017-04-03 MED ORDER — IBUPROFEN 200 MG PO TABS
600.0000 mg | ORAL_TABLET | Freq: Three times a day (TID) | ORAL | Status: DC | PRN
  Administered 2017-04-03 – 2017-04-06 (×4): 600 mg via ORAL
  Filled 2017-04-03 (×4): qty 3

## 2017-04-03 MED ORDER — ENOXAPARIN SODIUM 40 MG/0.4ML ~~LOC~~ SOLN: 40.0000 mg | Freq: Every day | SUBCUTANEOUS | Status: DC

## 2017-04-03 NOTE — Progress Notes (Signed)
[facility] Progress Note     Subjective: CC-  Patient admitted last night with n/v. Feeling better today. She reports mild RUQ abdominal pain. Denies cough, CP, SOB, dysuria.  HIDA pending.  Objective: Vital signs in last 24 hours: Temp:  [98.6 F (37 C)-103.7 F (39.8 C)] 99.6 F (37.6 C) (02/13 0645) Pulse Rate:  [96-130] 110 (02/13 0421) Resp:  [18-27] 26 (02/13 0421) BP: (98-127)/(60-87) 125/79 (02/13 0421) SpO2:  [95 %-100 %] 99 % (02/13 0421) Weight:  [140 lb (63.5 kg)-140 lb 1.3 oz (63.5 kg)] 140 lb (63.5 kg) (02/12 1222) Last BM Date: 04/02/17  Intake/Output from previous day: 02/12 0701 - 02/13 0700 In: 3048.3 [I.V.:598.3; IV Piggyback:2450] Out: -  Intake/Output this shift: No intake/output data recorded.  PE: Gen: Alert, NAD, pleasant HEENT: EOM's intact, pupils equal and round Card: tachycardic Pulm: CTAB, no W/R/R, effort normal Abd: Soft,ND, mild RUQ TTP without rebound or guarding, +BS in all 4 quadrants,lapincisions C/D/I Ext: calves soft and nontender, no edema Psych: A&Ox3  Skin: no rashes noted, warm and dry   Lab Results:  Recent Labs    04/02/17 1337 04/03/17 0021  WBC 11.1* 9.0  HGB 11.6* 9.5*  HCT 34.1* 28.4*  PLT 587* 472*   BMET Recent Labs    04/02/17 1337 04/03/17 0021  NA 135 134*  K 3.9 4.0  CL 100* 104  CO2 20* 20*  GLUCOSE 123* 144*  BUN 15 10  CREATININE 0.71 0.72  CALCIUM 9.4 8.5*   PT/INR No results for input(s): LABPROT, INR in the last 72 hours. CMP     Component Value Date/Time   NA 134 (L) 04/03/2017 0021   K 4.0 04/03/2017 0021   CL 104 04/03/2017 0021   CO2 20 (L) 04/03/2017 0021   GLUCOSE 144 (H) 04/03/2017 0021   BUN 10 04/03/2017 0021   BUN 12 12/07/2015   CREATININE 0.72 04/03/2017 0021   CREATININE 0.65 12/28/2016 1027   CALCIUM 8.5 (L) 04/03/2017 0021   PROT 7.0 04/03/2017 0021   ALBUMIN 2.5 (L) 04/03/2017 0021   AST 29 04/03/2017 0021   ALT 20 04/03/2017 0021    ALKPHOS 103 04/03/2017 0021   BILITOT 0.5 04/03/2017 0021   GFRNONAA >60 04/03/2017 0021   GFRNONAA 98 12/28/2016 1027   GFRAA >60 04/03/2017 0021   GFRAA 113 12/28/2016 1027   Lipase     Component Value Date/Time   LIPASE 31 04/02/2017 1337       Studies/Results: Dg Chest 2 View  Result Date: 04/02/2017 CLINICAL DATA:  Fever.  Recent drain removal. EXAM: CHEST  2 VIEW COMPARISON:  One-view chest x-ray 03/14/2017 FINDINGS: Heart size is normal. Lung volumes are low. Bibasilar airspace disease likely reflects atelectasis. The visualized soft tissues and bony thorax are unremarkable. IMPRESSION: Low lung volumes with bibasilar airspace disease, likely reflecting atelectasis. Electronically Signed   By: San Morelle M.D.   On: 04/02/2017 15:30   Ct Abdomen Pelvis W Contrast  Result Date: 04/02/2017 CLINICAL DATA:  Status post cholecystectomy on 03/09/2017, with nausea/vomiting and fever status post JP drain removal on 03/22/2017. EXAM: CT ABDOMEN AND PELVIS WITH CONTRAST TECHNIQUE: Multidetector CT imaging of the abdomen and pelvis was performed using the standard protocol following bolus administration of intravenous contrast. CONTRAST:  100 mL Isovue 300 IV COMPARISON:  None. FINDINGS: Lower chest: Mild patchy opacities in the right lower lobe (series 7/image 19), likely atelectasis, pneumonia not excluded. Hepatobiliary: No focal hepatic lesions. Post cholecystectomy. Complex fluid  collection along the inferior aspect of the liver/gallbladder fossa, with 3 distinct components, measuring 5.4 x 6.2 x 4.3 cm in aggregate. Dominant component along the posterior gallbladder fossa measures 2.8 cm (series 2/image 22). Dominant component along the anterior/lateral gallbladder fossa measures 2.7 cm (series 2/image 25). Dominant component medially in the gallbladder fossa extends along the wall of the gastric antrum, where it measures 3.8 cm (series 2/image 27). No intrahepatic or extrahepatic  ductal dilatation. Pancreas: Within normal limits. Spleen: Within normal limits. Adrenals/Urinary Tract: Adrenal glands are within normal limits. Kidneys are within normal limits.  No hydronephrosis. Bladder is within normal limits, noting a tiny focus of nondependent gas. Stomach/Bowel: Stomach is within normal limits. Moderate duodenal diverticulum. No evidence of bowel obstruction. Normal appendix (series 2/image 65). Vascular/Lymphatic: No evidence of abdominal aortic aneurysm. No suspicious abdominopelvic lymphadenopathy. Reproductive: Status post hysterectomy. No adnexal masses. Other: No abdominopelvic ascites. Mild postsurgical changes along the anterior abdominal wall. Musculoskeletal: Visualized osseous structures are within normal limits. IMPRESSION: Status post cholecystectomy. Complex fluid collections along the gallbladder fossa, as described above, measuring 5.4 x 6.2 x 4.3 cm in aggregate. These collections extend along the inferior aspect of the liver and into the wall of the gastric antrum. Presumably, this appearance is related to the patient's history of bile leak, although superimposed infection/abscess is not excluded. Mild patchy right lower lobe opacities, likely atelectasis, pneumonia not excluded. Electronically Signed   By: Julian Hy M.D.   On: 04/02/2017 15:54    Anti-infectives: Anti-infectives (From admission, onward)   Start     Dose/Rate Route Frequency Ordered Stop   04/02/17 2345  ciprofloxacin (CIPRO) IVPB 400 mg     400 mg 200 mL/hr over 60 Minutes Intravenous Every 12 hours 04/02/17 2338     04/02/17 1415  levofloxacin (LEVAQUIN) IVPB 750 mg     750 mg 100 mL/hr over 90 Minutes Intravenous  Once 04/02/17 1407 04/02/17 1550   04/02/17 1415  aztreonam (AZACTAM) 2 g in sodium chloride 0.9 % 100 mL IVPB     2 g 200 mL/hr over 30 Minutes Intravenous  Once 04/02/17 1407 04/02/17 1600   04/02/17 1415  vancomycin (VANCOCIN) IVPB 1000 mg/200 mL premix     1,000  mg 200 mL/hr over 60 Minutes Intravenous  Once 04/02/17 1407 04/02/17 1630       Assessment/Plan HTN/tachycardia - cozaar DM - SSI HLD - lipitor GERD - protonix  Acute on Chronic Calculus Cholecystitis S/p lap chole with IOC and ligation of biliary duct of Luschka 1/19 by Dr. Johney Maine S/p ERCP with CBD stent placement 1/24 for bile leak Readmitted 2/12 with n/v - CT shows complex fluid collections along gallbladder fossa 5.4 x 6.2 x 4.3 cm in aggregate - WBC 11.1 trending down now 9.0; TMAX 103.7, HR 110  ID - cipro 2/12>>day#2 FEN - IVF, NPO VTE - SCDs, lovenox Foley - none Follow up - Dr. Johney Maine  Plan - HIDA scan pending to confirm bile leak versus possible abscess in gallbladder fossa. IR has been consulted and is planning percutaneous drainage 04/04/17. Continue antibiotics, IVF, and NPO.   LOS: 1 day    Wellington Hampshire , [facility] 04/03/2017, 8:36 AM Pager: [phone] Consults: [phone] Mon-Fri 7:00 am-4:30 pm Sat-Sun 7:00 am-11:30 am

## 2017-04-03 NOTE — Progress Notes (Signed)
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 She had Lovenox last night. Will work up for 04/04/17.

## 2017-04-03 NOTE — Consult Note (Signed)
Chief Complaint: Patient was seen in consultation today for CT-guided drainage of gallbladder fossa fluid collection(s) Chief Complaint  Patient presents with  . Abdominal Pain    Referring Physician(s): Cornett,T  Supervising Physician: Sandi Mariscal  Patient Status: [facility] - In-pt  History of Present Illness: Whitney Neal is a 57 y.o. female who is status post laparoscopic cholecystectomy with ligation of biliary duct of Luschka on 03/09/17 secondary to acute on chronic cholecystitis.  Pathology revealed mild chronic cholecystitis with cholelithiasis.  Since that time patient has underwent ERCP x2 with sphincterotomy and stent placement secondary to bile leak.  She recently presented to the ED with nausea, vomiting, fever, constipation.  Subsequent imaging revealed complex fluid collections along the gallbladder fossa extending along the inferior aspect of the liver and into the wall of the gastric antrum.  Request now received from surgery for image guided drainage of the gallbladder fossa fluid collections.  Past Medical History:  Diagnosis Date  . Atypical chest pain   . Barrett's esophagus    history of.  Normal EGD 2011, 2014  . Cervical lymphadenopathy    left  . Cervical strain   . Constipation   . GERD (gastroesophageal reflux disease)   . Hypercholesteremia   . Hyperglycemia    a1c 6.3 7/12  . Hyperplastic colonic polyp    history of   . Hypertension   . Migraine   . Osteopenia   . PONV (postoperative nausea and vomiting)   . Ulcer 2010  . Urinary incontinence   . Vitamin D deficiency     Past Surgical History:  Procedure Laterality Date  . ABDOMINAL HYSTERECTOMY  2009   Dr. Bethann Goo - benign disease  . BLADDER SURGERY     bladder mesh surgery 2010- Dr. Rhodia Albright  . BREAST CYST ASPIRATION    . COLONOSCOPY W/ POLYPECTOMY  2010  . ERCP N/A 03/13/2017   Procedure: ENDOSCOPIC RETROGRADE CHOLANGIOPANCREATOGRAPHY (ERCP);  Surgeon: Carol Ada, MD;  Location: [facility];  Service: Endoscopy;  Laterality: N/A;  . ERCP N/A 03/14/2017   Procedure: ENDOSCOPIC RETROGRADE CHOLANGIOPANCREATOGRAPHY (ERCP);  Surgeon: Ladene Artist, MD;  Location: [facility];  Service: Endoscopy;  Laterality: N/A;  . LAPAROSCOPIC CHOLECYSTECTOMY SINGLE PORT N/A 03/09/2017   Procedure: LAPAROSCOPIC CHOLECYSTECTOMY WITH INTRAOPERATIVE CHOLANGIOGRAM, CLOSURE OF ABERRANT DUCT OF LUSCHKA;  Surgeon: Michael Boston, MD;  Location: [facility];  Service: General;  Laterality: N/A;    Allergies: Codeine; Morphine and related; and Rocephin [ceftriaxone sodium in dextrose]  Medications: Prior to Admission medications   Medication Sig Start Date End Date Taking? Authorizing Provider  aspirin 81 MG EC tablet Take 81 mg by mouth daily.     Yes [provider]  atorvastatin (LIPITOR) 40 MG tablet Take 1 tablet (40 mg total) by mouth daily. 02/08/16  Yes Emeterio Reeve, DO  docusate sodium (COLACE) 100 MG capsule Take 100 mg by mouth daily as needed for mild constipation.   Yes [provider]  fluticasone (FLONASE) 50 MCG/ACT nasal spray Place 1 spray into both nostrils daily as needed for allergies or rhinitis.   Yes [provider]  losartan (COZAAR) 25 MG tablet Take 1 tablet (25 mg total) daily by mouth. 12/27/16  Yes Emeterio Reeve, DO  metFORMIN (GLUCOPHAGE XR) 500 MG 24 hr tablet Take 1 tablet (500 mg total) by mouth daily with breakfast. 08/23/16 08/23/17 Yes Emeterio Reeve, DO  methylPREDNISolone (MEDROL) 4 MG tablet Take 4 mg by mouth. TAKE AS DIRECTED ON PACKAGE 03/28/17  Yes [provider]  Nutritional Supplements (BOOST PO) Take 1 Can by mouth daily.   Yes [provider]  omeprazole (PRILOSEC) 40 MG capsule Take 1 capsule (40 mg total) by mouth daily. 08/27/16  Yes Emeterio Reeve, DO  oxyCODONE (OXY IR/ROXICODONE) 5 MG immediate release tablet Take 1 tablet (5 mg total) by mouth every 4 (four) hours as needed for severe pain or  breakthrough pain. 03/15/17  Yes Meuth, Brooke A, PA-C  sodium bicarbonate 325 MG tablet Take 1-2 tablets (325-650 mg total) by mouth 2 (two) times daily. Prn heartburn Patient not taking: Reported on 04/02/2017 08/27/16   Emeterio Reeve, DO  sucralfate (CARAFATE) 1 GM/10ML suspension Take 10 mLs (1 g total) 4 (four) times daily -  with meals and at bedtime by mouth. Patient not taking: Reported on 04/02/2017 12/28/16   Emeterio Reeve, DO     Family History  Problem Relation Age of Onset  . Colon cancer Father   . Hypertension Father   . Heart attack Father 7  . Cancer Father        prostate CA  . Pulmonary embolism Mother        deceased  . Hypertension Mother   . Diabetes Sister   . Hypertension Brother   . Hypertension Sister   . Cancer Paternal Grandmother        oral  . Thyroid disease Sister        1/2 sister thyroid problem  . Healthy Daughter   . Heart attack Brother 9  . Breast cancer Cousin     Social History   Socioeconomic History  . Marital status: Married    Spouse name: None  . Number of children: 2  . Years of education: None  . Highest education level: None  Social Needs  . Financial resource strain: None  . Food insecurity - worry: None  . Food insecurity - inability: None  . Transportation needs - medical: None  . Transportation needs - non-medical: None  Occupational History  . Occupation: Chartered certified accountant    Employer: [employer]  Tobacco Use  . Smoking status: Never Smoker  . Smokeless tobacco: Never Used  Substance and Sexual Activity  . Alcohol use: No  . Drug use: No  . Sexual activity: None  Other Topics Concern  . None  Social History Narrative   Reviewed history from 07/04/2009 and no changes required.   Married- is guardian for two grandchildren ages 57 and 43.   Never Smoked   Alcohol use-no   Regular exercise-no   Works in housekeeping at [employer]  See above; currently denies chest  pain, worsening dyspnea, back pain or abnormal bleeding. Vital Signs: BP 125/79 (BP Location: Left Arm)   Pulse (!) 110   Temp 99.6 F (37.6 C) (Oral)   Resp (!) 26   Ht 5' (1.524 m)   Wt 140 lb (63.5 kg)   SpO2 99%   BMI 27.34 kg/m   Physical Exam awake, alert.  Chest clear to auscultation bilaterally.  Heart with tachycardic but regular rhythm.  Abdomen soft, positive bowel sounds, mild to moderate right upper quadrant/epigastric tenderness; no lower extremity edema.  Imaging: Dg Chest 2 View  Result Date: 04/02/2017 CLINICAL DATA:  Fever.  Recent drain removal. EXAM: CHEST  2 VIEW COMPARISON:  One-view chest x-ray 03/14/2017 FINDINGS: Heart size is normal. Lung volumes are low. Bibasilar airspace disease  likely reflects atelectasis. The visualized soft tissues and bony thorax are unremarkable. IMPRESSION: Low lung volumes with bibasilar airspace disease, likely reflecting atelectasis. Electronically Signed   By: San Morelle M.D.   On: 04/02/2017 15:30   Dg Chest 2 View  Result Date: 03/08/2017 CLINICAL DATA:  Acute right chest pain today. EXAM: CHEST  2 VIEW COMPARISON:  None. FINDINGS: The cardiomediastinal silhouette is unremarkable. There is no evidence of focal airspace disease, pulmonary edema, suspicious pulmonary nodule/mass, pleural effusion, or pneumothorax. No acute bony abnormalities are identified. IMPRESSION: No active cardiopulmonary disease. Electronically Signed   By: Margarette Canada M.D.   On: 03/08/2017 20:18   Dg Cholangiogram Operative  Result Date: 03/09/2017 CLINICAL DATA:  Calculus cholecystitis EXAM: INTRAOPERATIVE CHOLANGIOGRAM TECHNIQUE: Cholangiographic images from the C-arm fluoroscopic device were submitted for interpretation post-operatively. Please see the procedural report for the amount of contrast and the fluoroscopy time utilized. COMPARISON:  03/08/2017 FINDINGS: Intraoperative cholangiogram performed during the operative procedure. There is  leakage of contrast at the gallbladder fossa along the right liver margin from the cystic duct catheter. The biliary confluence, common hepatic duct, residual cystic duct, and common bile duct are patent. Contrast does drain into the duodenum. Mixing artifact noted throughout the biliary tree. Negative for significant obstruction, filling defect, or stricture. IMPRESSION: Patent biliary system. Electronically Signed   By: Jerilynn Mages.  Shick M.D.   On: 03/09/2017 08:54   Nm Hepatobiliary Liver Func  Result Date: 03/12/2017 CLINICAL DATA:  Recent laparoscopic cholecystectomy. Postop bile leak. EXAM: NUCLEAR MEDICINE HEPATOBILIARY IMAGING TECHNIQUE: Sequential images of the abdomen were obtained out to 60 minutes following intravenous administration of radiopharmaceutical. RADIOPHARMACEUTICALS:  5.1 mCi Tc-69m  Choletec IV COMPARISON:  Intraoperative cholangiogram on 03/09/2017 FINDINGS: Prompt uptake and biliary excretion of activity by the liver is seen. No gallbladder activity, consistent with prior cholecystectomy. Leak of bowel activity is seen into Morison's pouch, which extends inferiorly in the right paracolic gutter and superiorly in the right perihepatic space. No biliary activity is seen within the common bile duct or entering the small bowel. IMPRESSION: Large postop bile leak extending in the right paracolic gutter and right perihepatic space. No radiopharmaceutical activity seen within the common bile duct or small bowel. This is likely due to prominent bile leak, with common bile duct obstruction considered unlikely given patency of common bile duct seen on recent intraoperative cholangiogram. Electronically Signed   By: Earle Gell M.D.   On: 03/12/2017 14:49   Ct Abdomen Pelvis W Contrast  Result Date: 04/02/2017 CLINICAL DATA:  Status post cholecystectomy on 03/09/2017, with nausea/vomiting and fever status post JP drain removal on 03/22/2017. EXAM: CT ABDOMEN AND PELVIS WITH CONTRAST TECHNIQUE:  Multidetector CT imaging of the abdomen and pelvis was performed using the standard protocol following bolus administration of intravenous contrast. CONTRAST:  100 mL Isovue 300 IV COMPARISON:  None. FINDINGS: Lower chest: Mild patchy opacities in the right lower lobe (series 7/image 19), likely atelectasis, pneumonia not excluded. Hepatobiliary: No focal hepatic lesions. Post cholecystectomy. Complex fluid collection along the inferior aspect of the liver/gallbladder fossa, with 3 distinct components, measuring 5.4 x 6.2 x 4.3 cm in aggregate. Dominant component along the posterior gallbladder fossa measures 2.8 cm (series 2/image 22). Dominant component along the anterior/lateral gallbladder fossa measures 2.7 cm (series 2/image 25). Dominant component medially in the gallbladder fossa extends along the wall of the gastric antrum, where it measures 3.8 cm (series 2/image 27). No intrahepatic or extrahepatic ductal dilatation. Pancreas: Within normal limits.  Spleen: Within normal limits. Adrenals/Urinary Tract: Adrenal glands are within normal limits. Kidneys are within normal limits.  No hydronephrosis. Bladder is within normal limits, noting a tiny focus of nondependent gas. Stomach/Bowel: Stomach is within normal limits. Moderate duodenal diverticulum. No evidence of bowel obstruction. Normal appendix (series 2/image 65). Vascular/Lymphatic: No evidence of abdominal aortic aneurysm. No suspicious abdominopelvic lymphadenopathy. Reproductive: Status post hysterectomy. No adnexal masses. Other: No abdominopelvic ascites. Mild postsurgical changes along the anterior abdominal wall. Musculoskeletal: Visualized osseous structures are within normal limits. IMPRESSION: Status post cholecystectomy. Complex fluid collections along the gallbladder fossa, as described above, measuring 5.4 x 6.2 x 4.3 cm in aggregate. These collections extend along the inferior aspect of the liver and into the wall of the gastric antrum.  Presumably, this appearance is related to the patient's history of bile leak, although superimposed infection/abscess is not excluded. Mild patchy right lower lobe opacities, likely atelectasis, pneumonia not excluded. Electronically Signed   By: Julian Hy M.D.   On: 04/02/2017 15:54   Dg Chest Port 1 View  Result Date: 03/14/2017 CLINICAL DATA:  New onset of shortness of breath and mid chest pain last night. History of atypical chest pain, Barrett's esophagus, hypertension, nonsmoker. EXAM: PORTABLE CHEST 1 VIEW COMPARISON:  Chest x-ray of March 08, 2017 FINDINGS: The lungs are hypoinflated. There is linear increased density at the left lung base which is new. The heart and pulmonary vascularity are normal. The mediastinum is normal in width. There is no pleural effusion. A tubular structure projects in the upper abdomen. IMPRESSION: Bilateral hypoinflation. New subsegmental atelectasis at the left lung base. No CHF. Electronically Signed   By: David  Martinique M.D.   On: 03/14/2017 09:47   Dg Ercp Biliary & Pancreatic Ducts  Result Date: 03/14/2017 CLINICAL DATA:  Bile leak post lap cholecystectomy EXAM: ERCP TECHNIQUE: Multiple spot images obtained with the fluoroscopic device and submitted for interpretation post-procedure. COMPARISON:  Scintigraphy 03/12/2017 FINDINGS: A series of fluoroscopic spot images document endoscopic cannulation and opacification of the CBD with passage of a guidewire and deployment of a plastic endoscopic biliary stent across the CB D. there is extravasation of contrast in the region of the cholecystectomy clips . IMPRESSION: 1. Endoscopic biliary stent placement. 2. Persistent biliary leak is noted. These images were submitted for radiologic interpretation only. Please see the procedural report for the amount of contrast and the fluoroscopy time utilized. Electronically Signed   By: Lucrezia Europe M.D.   On: 03/14/2017 13:31   Dg C-arm 1-60 Min-no Report  Result Date:  03/13/2017 Fluoroscopy was utilized by the requesting physician.  No radiographic interpretation.   US Abdomen Limited Ruq  Result Date: 03/08/2017 CLINICAL DATA:  Right upper quadrant pain EXAM: ULTRASOUND ABDOMEN LIMITED RIGHT UPPER QUADRANT COMPARISON:  02/01/2017 FINDINGS: Gallbladder: Multiple mobile gallstones. Positive sonographic Murphy. Normal wall thickness Common bile duct: Diameter: 4 mm Liver: Small echogenic mass measuring 8 mm in the right lobe. Portal vein is patent on color Doppler imaging with normal direction of blood flow towards the liver. IMPRESSION: 1. Cholelithiasis with positive sonographic Murphy; gallbladder wall thickness is normal however, concern is for an acute cholecystitis. 2. Negative for biliary dilatation 3. Stable 8 mm echogenic mass in the right liver. Electronically Signed   By: Donavan Foil M.D.   On: 03/08/2017 22:56    Labs:  CBC: Recent Labs    03/12/17 0534 03/14/17 0924 04/02/17 1337 04/03/17 0021  WBC 12.2* 9.9 11.1* 9.0  HGB 12.3 11.8* 11.6*  9.5*  HCT 37.9 35.2* 34.1* 28.4*  PLT 314 373 587* 472*    COAGS: No results for input(s): INR, APTT in the last 8760 hours.  BMP: Recent Labs    03/12/17 0534 03/14/17 0924 04/02/17 1337 04/03/17 0021  NA 137 140 135 134*  K 3.5 3.7 3.9 4.0  CL 104 107 100* 104  CO2 26 25 20* 20*  GLUCOSE 109* 108* 123* 144*  BUN 12 11 15 10   CALCIUM 8.5* 9.0 9.4 8.5*  CREATININE 0.63 0.63 0.71 0.72  GFRNONAA >60 >60 >60 >60  GFRAA >60 >60 >60 >60    LIVER FUNCTION TESTS: Recent Labs    03/11/17 0434 03/12/17 0534 04/02/17 1337 04/03/17 0021  BILITOT 1.0 0.8 1.1 0.5  AST 77* 41 31 29  ALT 143* 96* 24 20  ALKPHOS 104 95 126 103  PROT 6.9 6.6 8.6* 7.0  ALBUMIN 3.7 3.6 3.0* 2.5*    TUMOR MARKERS: No results for input(s): AFPTM, CEA, CA199, CHROMGRNA in the last 8760 hours.  Assessment and Plan: 57 y.o. female who is status post laparoscopic cholecystectomy with ligation of biliary duct of  Luschka on 03/09/17 secondary to acute on chronic cholecystitis.  Pathology revealed mild chronic cholecystitis with cholelithiasis.  Since that time patient has underwent ERCP x2 with sphincterotomy and stent placement secondary to bile leak.  She recently presented to the ED with nausea, vomiting, fever, constipation.  Subsequent imaging revealed complex fluid collections along the gallbladder fossa extending along the inferior aspect of the liver and into the wall of the gastric antrum.  Request now received from surgery for image guided drainage of the gallbladder fossa fluid collections.  Imaging studies have been reviewed by Dr. Barbie Banner and he feels collection is amenable to drainage.  Current labs include WBC 9.0, hemoglobin 9.5, platelets 472 k, creatinine 0.72, PT/INR pending . Hepatobiliary scan today showed resolution of bile leak. She is currently on IV Cipro. Risks and benefits discussed with the patient/spouse including bleeding, infection, damage to adjacent structures, bowel perforation/fistula connection, and sepsis.  All of the patient's questions were answered, patient is agreeable to proceed. Consent signed and in chart.  Patient received Lovenox earlier this morning therefore the procedure will be done on 2/14.  Lovenox has been held until after procedure.    Thank you for this interesting consult.  I greatly enjoyed meeting LY WASS and look forward to participating in their care.  A copy of this report was sent to the requesting provider on this date.  Electronically Signed: D. Rowe Robert, PA-C 04/03/2017, 10:44 AM   I spent a total of 30 minutes  in face to face in clinical consultation, greater than 50% of which was counseling/coordinating care for CT-guided aspiration/drainage of gallbladder fossa fluid collection

## 2017-04-04 ENCOUNTER — Inpatient Hospital Stay (HOSPITAL_COMMUNITY): Payer: 59

## 2017-04-04 ENCOUNTER — Encounter (HOSPITAL_COMMUNITY): Payer: Self-pay | Admitting: Radiology

## 2017-04-04 DIAGNOSIS — K750 Abscess of liver: Secondary | ICD-10-CM

## 2017-04-04 HISTORY — DX: Abscess of liver: K75.0

## 2017-04-04 LAB — CBC WITH DIFFERENTIAL/PLATELET
Basophils Absolute: 0 10*3/uL (ref 0.0–0.1)
Basophils Relative: 0 %
Eosinophils Absolute: 0 10*3/uL (ref 0.0–0.7)
Eosinophils Relative: 0 %
HCT: 26.7 % — ABNORMAL LOW (ref 36.0–46.0)
Hemoglobin: 8.9 g/dL — ABNORMAL LOW (ref 12.0–15.0)
Lymphocytes Relative: 10 %
Lymphs Abs: 0.9 10*3/uL (ref 0.7–4.0)
MCH: 28.1 pg (ref 26.0–34.0)
MCHC: 33.3 g/dL (ref 30.0–36.0)
MCV: 84.2 fL (ref 78.0–100.0)
Monocytes Absolute: 0.8 10*3/uL (ref 0.1–1.0)
Monocytes Relative: 8 %
Neutro Abs: 7.9 10*3/uL — ABNORMAL HIGH (ref 1.7–7.7)
Neutrophils Relative %: 82 %
Platelets: 424 10*3/uL — ABNORMAL HIGH (ref 150–400)
RBC: 3.17 MIL/uL — ABNORMAL LOW (ref 3.87–5.11)
RDW: 14.1 % (ref 11.5–15.5)
WBC: 9.6 10*3/uL (ref 4.0–10.5)

## 2017-04-04 LAB — GLUCOSE, CAPILLARY
Glucose-Capillary: 126 mg/dL — ABNORMAL HIGH (ref 65–99)
Glucose-Capillary: 118 mg/dL — ABNORMAL HIGH (ref 65–99)
Glucose-Capillary: 117 mg/dL — ABNORMAL HIGH (ref 65–99)
Glucose-Capillary: 121 mg/dL — ABNORMAL HIGH (ref 65–99)
Glucose-Capillary: 110 mg/dL — ABNORMAL HIGH (ref 65–99)
Glucose-Capillary: 130 mg/dL — ABNORMAL HIGH (ref 65–99)

## 2017-04-04 LAB — PROTIME-INR
INR: 1.24
Prothrombin Time: 15.5 seconds — ABNORMAL HIGH (ref 11.4–15.2)

## 2017-04-04 MED ORDER — ENOXAPARIN SODIUM 40 MG/0.4ML ~~LOC~~ SOLN
40.0000 mg | SUBCUTANEOUS | Status: DC
  Administered 2017-04-05 – 2017-04-08 (×4): 40 mg via SUBCUTANEOUS
  Filled 2017-04-04 (×4): qty 0.4

## 2017-04-04 MED ORDER — MIDAZOLAM HCL 2 MG/2ML IJ SOLN
INTRAMUSCULAR | Status: AC | PRN
  Administered 2017-04-04 (×2): 1 mg via INTRAVENOUS

## 2017-04-04 MED ORDER — FENTANYL CITRATE (PF) 100 MCG/2ML IJ SOLN
INTRAMUSCULAR | Status: AC
  Filled 2017-04-04: qty 2

## 2017-04-04 MED ORDER — METRONIDAZOLE IN NACL 5-0.79 MG/ML-% IV SOLN
500.0000 mg | Freq: Three times a day (TID) | INTRAVENOUS | Status: DC
  Administered 2017-04-04 – 2017-04-08 (×13): 500 mg via INTRAVENOUS
  Filled 2017-04-04 (×14): qty 100

## 2017-04-04 MED ORDER — LIDOCAINE HCL (PF) 1 % IJ SOLN
INTRAMUSCULAR | Status: AC | PRN
  Administered 2017-04-04: 30 mL
  Administered 2017-04-04: 10 mL

## 2017-04-04 MED ORDER — FENTANYL CITRATE (PF) 100 MCG/2ML IJ SOLN
INTRAMUSCULAR | Status: AC | PRN
  Administered 2017-04-04 (×2): 50 ug via INTRAVENOUS

## 2017-04-04 MED ORDER — SODIUM CHLORIDE 0.9% FLUSH
5.0000 mL | Freq: Three times a day (TID) | INTRAVENOUS | Status: DC
  Administered 2017-04-04 – 2017-04-07 (×11): 5 mL

## 2017-04-04 MED ORDER — MIDAZOLAM HCL 2 MG/2ML IJ SOLN
INTRAMUSCULAR | Status: AC
  Filled 2017-04-04: qty 2

## 2017-04-04 NOTE — Procedures (Signed)
Pre procedural Dx: Post-op abscess Post procedural Dx: Same  Technically successful CT guided placement of 2 10 Fr drainage catheters yielding a total of 40 cc of purulent fluid.    All aspirated samples sent to the laboratory for analysis.    EBL: Minimal  Complications: None immediate  Ronny Bacon, MD Pager #: [phone]

## 2017-04-04 NOTE — Progress Notes (Signed)
MEDICATION-RELATED CONSULT NOTE   IR Procedure Consult - Anticoagulant/Antiplatelet PTA/Inpatient Med List Review by Pharmacist    Procedure: CT guided placement of 2 10 Fr drainage catheters for post-op abscess    Completed: 2/14 9:58am  Post-Procedural bleeding risk per IR MD assessment:  standard  Antithrombotic medications on inpatient or PTA profile prior to procedure:   Lovenox 40 mg SQ q24h, last dose 2/13 0059    Recommended restart time per IR Post-Procedure Guidelines:  Day + 1 (next AM)   Other considerations:      Plan:     Resume Lovenox 40 mg SQ q24h tomorrow morning (2/15 at 10 am).  Hershal Coria, PharmD, BCPS Pager: [phone] 04/04/2017 11:54 AM

## 2017-04-04 NOTE — Progress Notes (Signed)
CC:  Abdominal pain, fever  Subjective: Not allot of pain, but still running fevers.  On exam nothing acute, she is not toxic appearing.    Objective: Vital signs in last 24 hours: Temp:  [99.5 F (37.5 C)-103 F (39.4 C)] 101.9 F (38.8 C) (02/14 0541) Pulse Rate:  [98-110] 107 (02/14 0541) Resp:  [19-20] 19 (02/14 0541) BP: (120-135)/(64-84) 120/75 (02/14 0541) SpO2:  [96 %-100 %] 100 % (02/14 0541) Last BM Date: 04/02/17  2511 IV Nothing PO recorded Urine x 3 Ongoing fever up to 103 WBC 9.6  H/H down some   Intake/Output from previous day: 02/13 0701 - 02/14 0700 In: 2511.7 [I.V.:2311.7; IV Piggyback:200] Out: 3 [Urine:3] Intake/Output this shift: No intake/output data recorded.  General appearance: alert, cooperative and no distress Resp: clear to auscultation bilaterally GI: she says she has discomfort RUQ and mid epigastric area, but not all that tender on palpation.    Lab Results:  Recent Labs    04/03/17 0021 04/04/17 0409  WBC 9.0 9.6  HGB 9.5* 8.9*  HCT 28.4* 26.7*  PLT 472* 424*    BMET Recent Labs    04/02/17 1337 04/03/17 0021  NA 135 134*  K 3.9 4.0  CL 100* 104  CO2 20* 20*  GLUCOSE 123* 144*  BUN 15 10  CREATININE 0.71 0.72  CALCIUM 9.4 8.5*   PT/INR Recent Labs    04/04/17 0409  LABPROT 15.5*  INR 1.24    Recent Labs  Lab 04/02/17 1337 04/03/17 0021  AST 31 29  ALT 24 20  ALKPHOS 126 103  BILITOT 1.1 0.5  PROT 8.6* 7.0  ALBUMIN 3.0* 2.5*     Lipase     Component Value Date/Time   LIPASE 31 04/02/2017 1337     Medications: . atorvastatin  40 mg Oral Daily  . insulin aspart  0-15 Units Subcutaneous Q4H  . losartan  25 mg Oral Daily  . pantoprazole  40 mg Oral Daily   Anti-infectives (From admission, onward)   Start     Dose/Rate Route Frequency Ordered Stop   04/02/17 2345  ciprofloxacin (CIPRO) IVPB 400 mg     400 mg 200 mL/hr over 60 Minutes Intravenous Every 12 hours 04/02/17 2338     04/02/17  1415  levofloxacin (LEVAQUIN) IVPB 750 mg     750 mg 100 mL/hr over 90 Minutes Intravenous  Once 04/02/17 1407 04/02/17 1550   04/02/17 1415  aztreonam (AZACTAM) 2 g in sodium chloride 0.9 % 100 mL IVPB     2 g 200 mL/hr over 30 Minutes Intravenous  Once 04/02/17 1407 04/02/17 1600   04/02/17 1415  vancomycin (VANCOCIN) IVPB 1000 mg/200 mL premix     1,000 mg 200 mL/hr over 60 Minutes Intravenous  Once 04/02/17 1407 04/02/17 1630       Assessment/Plan HTN/tachycardia - cozaar DM - SSI HLD - lipitor GERD - protonix  Acute on Chronic Calculus Cholecystitis S/p lap chole with IOC and ligation of biliary duct of Luschka 1/19 by Dr. Johney Maine S/p ERCP with CBD stent placement 1/24 for bile leak Readmitted 2/12 with n/v - CT shows complex fluid collections along gallbladder fossa 5.4 x 6.2 x 4.3 cm in aggregate - WBC 11.1 trending down now 9.0; TMAX 103.7, HR 110 - HIDA 2/13:  Resolution of bile leak. Normal hepatobiliary scan post cholecystectomy.  ID - cipro 2/12 =>> day 2 Temp up to 103 No blood cultures last PM; add Flagyl FEN -  IVF, NPO VTE - SCDs, lovenox on hold for IR procedure Foley - none Follow up - Dr. Johney Maine   Plan:  I will add Flagyl, she has had some reaction with Rocephin in the past, on Cipro with ongoing elevated temp.  For IR drain today.  I will order repeat blood cultures with elevated temp.  I have ordered a set for now also temp still 101.9.        LOS: 2 days    Whitney Neal 04/04/2017 [phone]

## 2017-04-05 LAB — BASIC METABOLIC PANEL
Anion gap: 9 (ref 5–15)
BUN: <5 mg/dL — ABNORMAL LOW (ref 6–20)
CO2: 20 mmol/L — ABNORMAL LOW (ref 22–32)
Calcium: 8.2 mg/dL — ABNORMAL LOW (ref 8.9–10.3)
Chloride: 110 mmol/L (ref 101–111)
Creatinine, Ser: 0.68 mg/dL (ref 0.44–1.00)
GFR calc Af Amer: >60 mL/min (ref >60)
GFR calc non Af Amer: >60 mL/min (ref >60)
Glucose, Bld: 134 mg/dL — ABNORMAL HIGH (ref 65–99)
Potassium: 3 mmol/L — ABNORMAL LOW (ref 3.5–5.1)
Sodium: 139 mmol/L (ref 135–145)

## 2017-04-05 LAB — GLUCOSE, CAPILLARY
Glucose-Capillary: 129 mg/dL — ABNORMAL HIGH (ref 65–99)
Glucose-Capillary: 125 mg/dL — ABNORMAL HIGH (ref 65–99)
Glucose-Capillary: 129 mg/dL — ABNORMAL HIGH (ref 65–99)
Glucose-Capillary: 103 mg/dL — ABNORMAL HIGH (ref 65–99)
Glucose-Capillary: 124 mg/dL — ABNORMAL HIGH (ref 65–99)

## 2017-04-05 LAB — CBC
HCT: 26.7 % — ABNORMAL LOW (ref 36.0–46.0)
Hemoglobin: 9 g/dL — ABNORMAL LOW (ref 12.0–15.0)
MCH: 28.1 pg (ref 26.0–34.0)
MCHC: 33.7 g/dL (ref 30.0–36.0)
MCV: 83.4 fL (ref 78.0–100.0)
Platelets: 447 10*3/uL — ABNORMAL HIGH (ref 150–400)
RBC: 3.2 MIL/uL — ABNORMAL LOW (ref 3.87–5.11)
RDW: 14.3 % (ref 11.5–15.5)
WBC: 8.4 10*3/uL (ref 4.0–10.5)

## 2017-04-05 LAB — MAGNESIUM: Magnesium: 1.8 mg/dL (ref 1.7–2.4)

## 2017-04-05 MED ORDER — FLUTICASONE PROPIONATE 50 MCG/ACT NA SUSP
1.0000 | Freq: Every day | NASAL | Status: DC | PRN
  Filled 2017-04-05: qty 16

## 2017-04-05 MED ORDER — ASPIRIN EC 81 MG PO TBEC
81.0000 mg | DELAYED_RELEASE_TABLET | Freq: Every day | ORAL | Status: DC
  Administered 2017-04-05 – 2017-04-08 (×4): 81 mg via ORAL
  Filled 2017-04-05 (×4): qty 1

## 2017-04-05 MED ORDER — ACETAMINOPHEN 500 MG PO TABS
1000.0000 mg | ORAL_TABLET | Freq: Three times a day (TID) | ORAL | Status: DC
  Administered 2017-04-06 – 2017-04-08 (×7): 1000 mg via ORAL
  Filled 2017-04-05 (×8): qty 2

## 2017-04-05 MED ORDER — LACTATED RINGERS IV BOLUS (SEPSIS): 1000.0000 mL | Freq: Three times a day (TID) | INTRAVENOUS | Status: AC | PRN

## 2017-04-05 MED ORDER — POTASSIUM CHLORIDE CRYS ER 20 MEQ PO TBCR: 20.0000 meq | EXTENDED_RELEASE_TABLET | Freq: Four times a day (QID) | ORAL | Status: DC

## 2017-04-05 MED ORDER — FENTANYL CITRATE (PF) 100 MCG/2ML IJ SOLN
50.0000 ug | INTRAMUSCULAR | Status: DC | PRN
  Administered 2017-04-05 (×2): 50 ug via INTRAVENOUS
  Filled 2017-04-05 (×3): qty 2

## 2017-04-05 MED ORDER — POTASSIUM CHLORIDE 2 MEQ/ML IV SOLN
INTRAVENOUS | Status: DC
  Administered 2017-04-05 – 2017-04-07 (×4): via INTRAVENOUS
  Filled 2017-04-05 (×8): qty 1000

## 2017-04-05 MED ORDER — LORAZEPAM 0.5 MG PO TABS: 0.5000 mg | ORAL_TABLET | Freq: Three times a day (TID) | ORAL | Status: DC | PRN

## 2017-04-05 MED ORDER — SODIUM CHLORIDE 0.9% FLUSH
3.0000 mL | Freq: Two times a day (BID) | INTRAVENOUS | Status: DC
  Administered 2017-04-05 – 2017-04-08 (×2): 3 mL via INTRAVENOUS

## 2017-04-05 MED ORDER — INSULIN ASPART 100 UNIT/ML ~~LOC~~ SOLN
0.0000 [IU] | Freq: Three times a day (TID) | SUBCUTANEOUS | Status: DC
  Administered 2017-04-05 – 2017-04-07 (×2): 2 [IU] via SUBCUTANEOUS

## 2017-04-05 MED ORDER — POTASSIUM CHLORIDE CRYS ER 20 MEQ PO TBCR
40.0000 meq | EXTENDED_RELEASE_TABLET | Freq: Three times a day (TID) | ORAL | Status: AC
  Administered 2017-04-05 – 2017-04-07 (×9): 40 meq via ORAL
  Filled 2017-04-05: qty 4
  Filled 2017-04-05: qty 2
  Filled 2017-04-05 (×5): qty 4
  Filled 2017-04-05 (×3): qty 2

## 2017-04-05 MED ORDER — SODIUM CHLORIDE 0.9 % IV SOLN: 250.0000 mL | INTRAVENOUS | Status: DC | PRN

## 2017-04-05 MED ORDER — OXYCODONE HCL 5 MG PO TABS: 5.0000 mg | ORAL_TABLET | ORAL | Status: DC | PRN

## 2017-04-05 MED ORDER — SODIUM CHLORIDE 0.9% FLUSH: 3.0000 mL | INTRAVENOUS | Status: DC | PRN

## 2017-04-05 MED ORDER — BOOST PO LIQD
237.0000 mL | Freq: Two times a day (BID) | ORAL | Status: DC
  Administered 2017-04-05 – 2017-04-08 (×6): 237 mL via ORAL
  Filled 2017-04-05 (×8): qty 237

## 2017-04-05 MED ORDER — POTASSIUM CHLORIDE CRYS ER 20 MEQ PO TBCR: 40.0000 meq | EXTENDED_RELEASE_TABLET | Freq: Two times a day (BID) | ORAL | Status: DC

## 2017-04-05 MED ORDER — GABAPENTIN 300 MG PO CAPS
300.0000 mg | ORAL_CAPSULE | Freq: Every day | ORAL | Status: AC
  Administered 2017-04-05 – 2017-04-07 (×3): 300 mg via ORAL
  Filled 2017-04-05 (×3): qty 1

## 2017-04-05 MED ORDER — LORAZEPAM 2 MG/ML IJ SOLN: 0.5000 mg | Freq: Three times a day (TID) | INTRAMUSCULAR | Status: DC | PRN

## 2017-04-05 NOTE — Progress Notes (Signed)
Referring Physician(s): Cornett,T  Supervising Physician: Corrie Mckusick  Patient Status:  [facility] - In-pt  Chief Complaint:  Hepatic abscesses  Subjective: Pt feeling a little better this am; still has some RUQ/epigastric discomfort; has ambulated   Allergies: Codeine; Morphine and related; and Rocephin [ceftriaxone sodium in dextrose]  Medications: Prior to Admission medications   Medication Sig Start Date End Date Taking? Authorizing Provider  aspirin 81 MG EC tablet Take 81 mg by mouth daily.     Yes [provider]  atorvastatin (LIPITOR) 40 MG tablet Take 1 tablet (40 mg total) by mouth daily. 02/08/16  Yes Emeterio Reeve, DO  docusate sodium (COLACE) 100 MG capsule Take 100 mg by mouth daily as needed for mild constipation.   Yes [provider]  fluticasone (FLONASE) 50 MCG/ACT nasal spray Place 1 spray into both nostrils daily as needed for allergies or rhinitis.   Yes [provider]  losartan (COZAAR) 25 MG tablet Take 1 tablet (25 mg total) daily by mouth. 12/27/16  Yes Emeterio Reeve, DO  metFORMIN (GLUCOPHAGE XR) 500 MG 24 hr tablet Take 1 tablet (500 mg total) by mouth daily with breakfast. 08/23/16 08/23/17 Yes Emeterio Reeve, DO  methylPREDNISolone (MEDROL) 4 MG tablet Take 4 mg by mouth. TAKE AS DIRECTED ON PACKAGE 03/28/17  Yes [provider]  Nutritional Supplements (BOOST PO) Take 1 Can by mouth daily.   Yes [provider]  omeprazole (PRILOSEC) 40 MG capsule Take 1 capsule (40 mg total) by mouth daily. 08/27/16  Yes Emeterio Reeve, DO  oxyCODONE (OXY IR/ROXICODONE) 5 MG immediate release tablet Take 1 tablet (5 mg total) by mouth every 4 (four) hours as needed for severe pain or breakthrough pain. 03/15/17  Yes Meuth, Brooke A, PA-C  sodium bicarbonate 325 MG tablet Take 1-2 tablets (325-650 mg total) by mouth 2 (two) times daily. Prn heartburn Patient not taking: Reported on 04/02/2017 08/27/16   Emeterio Reeve, DO  sucralfate (CARAFATE) 1 GM/10ML suspension Take 10 mLs (1 g total) 4 (four) times daily -  with meals and at bedtime by mouth. Patient not taking: Reported on 04/02/2017 12/28/16   Emeterio Reeve, DO     Vital Signs: BP (!) 150/88 (BP Location: Right Arm)   Pulse 98   Temp 98.9 F (37.2 C) (Oral)   Resp 18   Ht 5' (1.524 m)   Wt 140 lb (63.5 kg)   SpO2 96%   BMI 27.34 kg/m   Physical Exam RUQ drains intact, outputs 20-70 cc blood tinged fluid; dressing dry, mild-mod tender  Imaging: Dg Chest 2 View  Result Date: 04/02/2017 CLINICAL DATA:  Fever.  Recent drain removal. EXAM: CHEST  2 VIEW COMPARISON:  One-view chest x-ray 03/14/2017 FINDINGS: Heart size is normal. Lung volumes are low. Bibasilar airspace disease likely reflects atelectasis. The visualized soft tissues and bony thorax are unremarkable. IMPRESSION: Low lung volumes with bibasilar airspace disease, likely reflecting atelectasis. Electronically Signed   By: San Morelle M.D.   On: 04/02/2017 15:30   Nm Hepatobiliary Liver Func  Result Date: 04/03/2017 CLINICAL DATA:  History of cholecystectomy and bile leak. EXAM: NUCLEAR MEDICINE HEPATOBILIARY IMAGING TECHNIQUE: Sequential images of the abdomen were obtained out to 60 minutes following intravenous administration of radiopharmaceutical. RADIOPHARMACEUTICALS:  5.3 mCi Tc-65m  Choletec IV COMPARISON:  03/12/2017 FINDINGS: Prompt and symmetric uptake in the liver and normal excretion into the biliary tree. The gallbladder is surgically absent. Activity is seen in the small bowel by 15  minutes. No leak is identified. IMPRESSION: Resolution of bile leak. Normal hepatobiliary scan post cholecystectomy. Electronically Signed   By: Marijo Sanes M.D.   On: 04/03/2017 12:41   Ct Abdomen Pelvis W Contrast  Result Date: 04/02/2017 CLINICAL DATA:  Status post cholecystectomy on 03/09/2017, with nausea/vomiting and fever status post JP drain removal on 03/22/2017.  EXAM: CT ABDOMEN AND PELVIS WITH CONTRAST TECHNIQUE: Multidetector CT imaging of the abdomen and pelvis was performed using the standard protocol following bolus administration of intravenous contrast. CONTRAST:  100 mL Isovue 300 IV COMPARISON:  None. FINDINGS: Lower chest: Mild patchy opacities in the right lower lobe (series 7/image 19), likely atelectasis, pneumonia not excluded. Hepatobiliary: No focal hepatic lesions. Post cholecystectomy. Complex fluid collection along the inferior aspect of the liver/gallbladder fossa, with 3 distinct components, measuring 5.4 x 6.2 x 4.3 cm in aggregate. Dominant component along the posterior gallbladder fossa measures 2.8 cm (series 2/image 22). Dominant component along the anterior/lateral gallbladder fossa measures 2.7 cm (series 2/image 25). Dominant component medially in the gallbladder fossa extends along the wall of the gastric antrum, where it measures 3.8 cm (series 2/image 27). No intrahepatic or extrahepatic ductal dilatation. Pancreas: Within normal limits. Spleen: Within normal limits. Adrenals/Urinary Tract: Adrenal glands are within normal limits. Kidneys are within normal limits.  No hydronephrosis. Bladder is within normal limits, noting a tiny focus of nondependent gas. Stomach/Bowel: Stomach is within normal limits. Moderate duodenal diverticulum. No evidence of bowel obstruction. Normal appendix (series 2/image 65). Vascular/Lymphatic: No evidence of abdominal aortic aneurysm. No suspicious abdominopelvic lymphadenopathy. Reproductive: Status post hysterectomy. No adnexal masses. Other: No abdominopelvic ascites. Mild postsurgical changes along the anterior abdominal wall. Musculoskeletal: Visualized osseous structures are within normal limits. IMPRESSION: Status post cholecystectomy. Complex fluid collections along the gallbladder fossa, as described above, measuring 5.4 x 6.2 x 4.3 cm in aggregate. These collections extend along the inferior aspect of  the liver and into the wall of the gastric antrum. Presumably, this appearance is related to the patient's history of bile leak, although superimposed infection/abscess is not excluded. Mild patchy right lower lobe opacities, likely atelectasis, pneumonia not excluded. Electronically Signed   By: Julian Hy M.D.   On: 04/02/2017 15:54   Ct Image Guided Drainage By Percutaneous Catheter  Result Date: 04/04/2017 INDICATION: History of cholecystectomy complicated by development of a multiloculated abscesses about the gallbladder fossa. Request made for placement of image guided percutaneous drainage catheter(s) for infection source control purposes. EXAM: ULTRASOUND AND CT-GUIDED HEPATIC ABSCESS DRAINAGE CATHETER PLACEMENT X2. COMPARISON:  CT abdomen and pelvis - 04/02/2017 MEDICATIONS: The patient is currently admitted to the hospital and receiving intravenous antibiotics. The antibiotics were administered within an appropriate time frame prior to the initiation of the procedure. ANESTHESIA/SEDATION: Moderate (conscious) sedation was employed during this procedure. A total of Versed 2 mg and Fentanyl 100 mcg was administered intravenously. Moderate Sedation Time: 30 minutes. The patient's level of consciousness and vital signs were monitored continuously by radiology nursing throughout the procedure under my direct supervision. CONTRAST:  None COMPLICATIONS: None immediate. PROCEDURE: Informed written consent was obtained from the patient after a discussion of the risks, benefits and alternatives to treatment. The patient was placed supine, slightly LPO on the CT gantry and a pre procedural CT was performed re-demonstrating the known abscess/fluid collection within the hepatic parenchyma adjacent to the gallbladder fossa with dominant ill-defined hypoattenuating component measuring approximately 3.1 x 2.5 cm (image 27, series 2) and additional dominant subcapsular component measuring approximately 3.3  x 1.9  cm (image 33, series 2). The procedure was planned. A timeout was performed prior to the initiation of the procedure. The skin overlying the right upper abdomen was prepped and draped in the usual sterile fashion. The overlying soft tissues were anesthetized with 1% lidocaine with epinephrine. Under direct ultrasound guidance, the dominant component within the more central aspect of the right lobe of the liver was accessed with an 18 gauge trocar needle. An ultrasound image was saved for procedural documentation purposes. A short Amplatz wire was coiled within the collection. Appropriate position was confirmed with a limited CT scan. Next, the track was serially dilated allowing placement of a 10 [place] all-purpose drainage catheter. Postprocedural imaging demonstrates appropriate positioning of the percutaneous drainage catheter. Approximately 30 cc of purulent fluid was aspirated. Following evacuation of nearly all purulent material from the dominant component of the hepatic abscess, sonographic evaluation demonstrated persistent abscess about the subcapsular aspect the right lobe of the liver the decision was made to place an additional percutaneous drainage catheter. As such, under direct ultrasound guidance, the dominant component of the subcapsular collection was accessed with an 18 gauge trocar needle. An ultrasound image was saved for procedural documentation purposes. A short Amplatz wire was coiled within the collection. Appropriate position was confirmed with a limited CT scan. Next, the track was dilated allowing placement of a 10 [place] all-purpose drainage catheter. Postprocedural imaging demonstrates appropriate positioning of the percutaneous drainage catheter. Approximately 10 cc of purulent fluid was aspirated. A sample of aspirated purulent debris was capped and sent to the laboratory for analysis. Both hepatic abscess drainage catheters were connected to JP bulbs and sutured in place. Dressings  were placed. The patient tolerated the above procedures well without immediate post procedural complication. IMPRESSION: Successful ultrasound and CT-guided placement of two 10 [place] all-purpose drainage catheters into the dominant components of the multiloculated complex hepatic abscesses about the gallbladder fossa. Samples were sent to the laboratory as requested by the ordering clinical team. Electronically Signed   By: Sandi Mariscal M.D.   On: 04/04/2017 11:34   Ct Image Guided Drainage Percut Cath  Peritoneal Retroperit  Result Date: 04/04/2017 INDICATION: History of cholecystectomy complicated by development of a multiloculated abscesses about the gallbladder fossa. Request made for placement of image guided percutaneous drainage catheter(s) for infection source control purposes. EXAM: ULTRASOUND AND CT-GUIDED HEPATIC ABSCESS DRAINAGE CATHETER PLACEMENT X2. COMPARISON:  CT abdomen and pelvis - 04/02/2017 MEDICATIONS: The patient is currently admitted to the hospital and receiving intravenous antibiotics. The antibiotics were administered within an appropriate time frame prior to the initiation of the procedure. ANESTHESIA/SEDATION: Moderate (conscious) sedation was employed during this procedure. A total of Versed 2 mg and Fentanyl 100 mcg was administered intravenously. Moderate Sedation Time: 30 minutes. The patient's level of consciousness and vital signs were monitored continuously by radiology nursing throughout the procedure under my direct supervision. CONTRAST:  None COMPLICATIONS: None immediate. PROCEDURE: Informed written consent was obtained from the patient after a discussion of the risks, benefits and alternatives to treatment. The patient was placed supine, slightly LPO on the CT gantry and a pre procedural CT was performed re-demonstrating the known abscess/fluid collection within the hepatic parenchyma adjacent to the gallbladder fossa with dominant ill-defined hypoattenuating component  measuring approximately 3.1 x 2.5 cm (image 27, series 2) and additional dominant subcapsular component measuring approximately 3.3 x 1.9 cm (image 33, series 2). The procedure was planned. A timeout was performed prior to the initiation of the  procedure. The skin overlying the right upper abdomen was prepped and draped in the usual sterile fashion. The overlying soft tissues were anesthetized with 1% lidocaine with epinephrine. Under direct ultrasound guidance, the dominant component within the more central aspect of the right lobe of the liver was accessed with an 18 gauge trocar needle. An ultrasound image was saved for procedural documentation purposes. A short Amplatz wire was coiled within the collection. Appropriate position was confirmed with a limited CT scan. Next, the track was serially dilated allowing placement of a 10 [place] all-purpose drainage catheter. Postprocedural imaging demonstrates appropriate positioning of the percutaneous drainage catheter. Approximately 30 cc of purulent fluid was aspirated. Following evacuation of nearly all purulent material from the dominant component of the hepatic abscess, sonographic evaluation demonstrated persistent abscess about the subcapsular aspect the right lobe of the liver the decision was made to place an additional percutaneous drainage catheter. As such, under direct ultrasound guidance, the dominant component of the subcapsular collection was accessed with an 18 gauge trocar needle. An ultrasound image was saved for procedural documentation purposes. A short Amplatz wire was coiled within the collection. Appropriate position was confirmed with a limited CT scan. Next, the track was dilated allowing placement of a 10 [place] all-purpose drainage catheter. Postprocedural imaging demonstrates appropriate positioning of the percutaneous drainage catheter. Approximately 10 cc of purulent fluid was aspirated. A sample of aspirated purulent debris was capped and  sent to the laboratory for analysis. Both hepatic abscess drainage catheters were connected to JP bulbs and sutured in place. Dressings were placed. The patient tolerated the above procedures well without immediate post procedural complication. IMPRESSION: Successful ultrasound and CT-guided placement of two 10 French all-purpose drainage catheters into the dominant components of the multiloculated complex hepatic abscesses about the gallbladder fossa. Samples were sent to the laboratory as requested by the ordering clinical team. Electronically Signed   By: Sandi Mariscal M.D.   On: 04/04/2017 11:34    Labs:  CBC: Recent Labs    04/02/17 1337 04/03/17 0021 04/04/17 0409 04/05/17 0509  WBC 11.1* 9.0 9.6 8.4  HGB 11.6* 9.5* 8.9* 9.0*  HCT 34.1* 28.4* 26.7* 26.7*  PLT 587* 472* 424* 447*    COAGS: Recent Labs    04/04/17 0409  INR 1.24    BMP: Recent Labs    03/14/17 0924 04/02/17 1337 04/03/17 0021 04/05/17 0509  NA 140 135 134* 139  K 3.7 3.9 4.0 3.0*  CL 107 100* 104 110  CO2 25 20* 20* 20*  GLUCOSE 108* 123* 144* 134*  BUN 11 15 10  <5*  CALCIUM 9.0 9.4 8.5* 8.2*  CREATININE 0.63 0.71 0.72 0.68  GFRNONAA >60 >60 >60 >60  GFRAA >60 >60 >60 >60    LIVER FUNCTION TESTS: Recent Labs    03/11/17 0434 03/12/17 0534 04/02/17 1337 04/03/17 0021  BILITOT 1.0 0.8 1.1 0.5  AST 77* 41 31 29  ALT 143* 96* 24 20  ALKPHOS 104 95 126 103  PROT 6.9 6.6 8.6* 7.0  ALBUMIN 3.7 3.6 3.0* 2.5*    Assessment and Plan: S/p lap chole 03/09/17 with bile leak/sphincterotomy/stent placement; now with hepatic abscesses, s/p drains x2 RUQ 2/14; afebrile; WBC nl; hgb 9.0, creat nl; K 3.0 -replace; blood cx neg to date; drain fluid cx pend; cont drain irrigation tid; monitor outputs closely; check f/u CT within 1 week of drain placement as IP or at IR drain clinic   Electronically Signed: D. Rowe Robert, PA-C 04/05/2017, 10:34 AM  I spent a total of 15 minutes at the the patient's  bedside AND on the patient's hospital floor or unit, greater than 50% of which was counseling/coordinating care for hepatic abscess drains    Patient ID: Whitney Neal, female   DOB: [birth year], 57 y.o.   MRN: [id number]

## 2017-04-05 NOTE — Progress Notes (Signed)
[facility]  [address] [place], [place] [address] Phone: [phone]  FAX: [phone]      HEYLI MIN [id number] 10-09-1960  CARE TEAM:  PCP: Emeterio Reeve, DO  Outpatient Care Team: Patient Care Team: Emeterio Reeve, DO as PCP - General (Osteopathic Medicine) Carol Ada, MD as Consulting Physician (Gastroenterology) Michael Boston, MD as Consulting Physician (General Surgery)  Inpatient Treatment Team: Treatment Team: Attending Provider: Edison Pace Md, MD; Registered Nurse: Thigpen, Delman Cheadle, RN; Rounding Team: Dorthy Cooler Radiology, MD; Registered Nurse: Candie Chroman, RN; Registered Nurse: Rossie Muskrat, RN   Problem List:   Principal Problem:   Hepatic abscesses s/p perc drainage 04/04/2017 Active Problems:   Acute on chronic cholecystitis s/p lap cholecystectomy 03/09/2017   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    Assessment  Physically improving but mentally disheartened  Plan:  Follow-up on cultures of perihepatic abscesses.  Multiple species.  On Cipro and Flagyl for now given cephalosporin allergy.  She did develop these abscesses on that in the past, so it I would not be surprised if she a different regimen.  Switch to merepenem for now & adjust based on C&S  Drain care.  Try and transfer to a different floor if possible.  Advance to solid diet.  Stop IV fluids and monitor off that.  Pain and nausea control.  Hyperglycemia - metformin & SSI  backup  GERD - PPI  Hyperchol - liitor  Anxiolysis - Ativan  Discussed with her charge nurse on 3 W.  She saw her yesterday.  Try and get more support from her colleagues.  Trying to get the patient out of the holding area to a regular floor bed.  -VTE prophylaxis- SCDs, etc  -mobilize as tolerated to help recovery  20 minutes spent in review, evaluation, examination, counseling, and coordination of care.  More than 50% of that time was spent in counseling.  I updated the patient's status to the patient.  Recommendations were made.  Questions were answered.  She expressed understanding & appreciation.   [name], M.D., F.A.C.S. Gastrointestinal and Minimally Invasive Surgery [facility] [address] [place], [place] [address] [phone] Main / Paging   04/05/2017    Subjective: (Chief complaint)  Anxious and frustrated with being in temporary room.  Some nausea mostly controlled.  Some pain mostly controlled.  Tearful  Objective:  Vital signs:  Vitals:   04/04/17 1435 04/04/17 1828 04/04/17 1952 04/05/17 0543  BP: 104/71 100/61 122/69 129/78  Pulse: (!) 105 99 99 85  Resp:  16 20 18   Temp: 99.1 F (37.3 C) 100.2 F (37.9 C) 100.3 F (37.9 C) (!) 100.5 F (38.1 C)  TempSrc: Oral Oral Oral Oral  SpO2: 96% 94% 95%   Weight:      Height:        Last BM Date: 04/04/17  Intake/Output   Yesterday:  02/14 0701 - 02/15 0700 In: 1330 [P.O.:120; I.V.:1100; IV Piggyback:100] Out: 589 [Urine:500; Drains:89] This shift:  Total I/O In: -  Out: 520 [  Urine:500; Drains:20]  Bowel function:  Flatus: YES  BM:  YES  Drain: Sanguinopurulent   Physical Exam:  General: Pt awake/alert/oriented x4 in mild acute distress Eyes: PERRL, normal EOM.  Sclera clear.  No icterus Neuro: CN II-XII intact w/o focal sensory/motor deficits. Lymph: No head/neck/groin lymphadenopathy Psych:  No delerium/psychosis/paranoia.   Tearful.  Crying.  Somewhat consolable. HENT: Normocephalic, Mucus membranes moist.  No thrush Neck: Supple, No tracheal deviation Chest: No chest wall pain w good excursion CV:  Pulses intact.  Regular rhythm MS: Normal AROM mjr joints.  No obvious deformity  Abdomen: Soft.  Nondistended.  Mildly tender at drain sites only.  No evidence of peritonitis.  No incarcerated hernias.  Ext:  No deformity.  No mjr edema.  No cyanosis Skin: No petechiae / purpura  Results:   Labs: Results for orders placed or performed during the hospital encounter of 04/02/17 (from the past 48 hour(s))  Glucose, capillary     Status: Abnormal   Collection Time: 04/03/17  8:00 AM  Result Value Ref Range   Glucose-Capillary 125 (H) 65 - 99 mg/dL  Glucose, capillary     Status: Abnormal   Collection Time: 04/03/17 12:02 PM  Result Value Ref Range   Glucose-Capillary 127 (H) 65 - 99 mg/dL  Glucose, capillary     Status: Abnormal   Collection Time: 04/03/17  4:53 PM  Result Value Ref Range   Glucose-Capillary 120 (H) 65 - 99 mg/dL  Glucose, capillary     Status: Abnormal   Collection Time: 04/03/17  8:06 PM  Result Value Ref Range   Glucose-Capillary 127 (H) 65 - 99 mg/dL  Glucose, capillary     Status: None   Collection Time: 04/03/17 11:42 PM  Result Value Ref Range   Glucose-Capillary 94 65 - 99 mg/dL  Protime-INR     Status: Abnormal   Collection Time: 04/04/17  4:09 AM  Result Value Ref Range   Prothrombin Time 15.5 (H) 11.4 - 15.2 seconds   INR 1.24     Comment: Performed at [facility], [place] [address] [place], [place] [address]  CBC with Differential/Platelet     Status: Abnormal   Collection Time: 04/04/17  4:09 AM  Result Value Ref Range   WBC 9.6 4.0 - 10.5 K/uL   RBC 3.17 (L) 3.87 - 5.11 MIL/uL   Hemoglobin 8.9 (L) 12.0 - 15.0 g/dL   HCT 26.7 (L) 36.0 - 46.0 %   MCV 84.2 78.0 - 100.0 fL   MCH 28.1 26.0 - 34.0 pg   MCHC 33.3 30.0 - 36.0 g/dL   RDW 14.1 11.5 -  15.5 %   Platelets 424 (H) 150 - 400 K/uL   Neutrophils Relative % 82 %   Neutro Abs 7.9 (H) 1.7 - 7.7 K/uL   Lymphocytes Relative 10 %   Lymphs Abs 0.9 0.7 - 4.0 K/uL   Monocytes Relative 8 %   Monocytes Absolute 0.8 0.1 - 1.0 K/uL   Eosinophils Relative 0 %   Eosinophils Absolute 0.0 0.0 - 0.7 K/uL   Basophils Relative 0 %   Basophils Absolute 0.0 0.0 - 0.1 K/uL    Comment: Performed at [facility], [place] [address], [place], [place] [address]  Glucose, capillary     Status: Abnormal   Collection Time: 04/04/17  4:11 AM  Result Value Ref Range   Glucose-Capillary 126 (H) 65 - 99 mg/dL  Glucose, capillary     Status: Abnormal  Collection Time: 04/04/17  7:57 AM  Result Value Ref Range   Glucose-Capillary 118 (H) 65 - 99 mg/dL  Aerobic/Anaerobic Culture (surgical/deep wound)     Status: None (Preliminary result)   Collection Time: 04/04/17 10:19 AM  Result Value Ref Range   Specimen Description      ABSCESS ABDOMEN Performed at [facility], [place] [address] [place], [place] [address]    Special Requests      Normal Performed at [facility] [address], [place], [place] [address]    Gram Stain      ABUNDANT WBC PRESENT, PREDOMINANTLY PMN ABUNDANT GRAM POSITIVE COCCI MODERATE GRAM NEGATIVE RODS MODERATE GRAM POSITIVE RODS Performed at [facility], [place] [address] [place], [place] [address]    Culture PENDING    Report Status PENDING   Glucose, capillary     Status: Abnormal   Collection Time: 04/04/17 11:47 AM  Result Value Ref Range   Glucose-Capillary 117 (H) 65 - 99 mg/dL  Glucose, capillary     Status: Abnormal   Collection Time: 04/04/17  4:15 PM  Result Value Ref Range   Glucose-Capillary 121 (H) 65 - 99 mg/dL  Glucose, capillary     Status: Abnormal   Collection Time: 04/04/17  8:08 PM  Result Value Ref Range   Glucose-Capillary 110 (H) 65 - 99 mg/dL  Glucose, capillary     Status: Abnormal   Collection  Time: 04/04/17 11:31 PM  Result Value Ref Range   Glucose-Capillary 130 (H) 65 - 99 mg/dL  Glucose, capillary     Status: Abnormal   Collection Time: 04/05/17  3:46 AM  Result Value Ref Range   Glucose-Capillary 129 (H) 65 - 99 mg/dL  CBC     Status: Abnormal   Collection Time: 04/05/17  5:09 AM  Result Value Ref Range   WBC 8.4 4.0 - 10.5 K/uL   RBC 3.20 (L) 3.87 - 5.11 MIL/uL   Hemoglobin 9.0 (L) 12.0 - 15.0 g/dL   HCT 26.7 (L) 36.0 - 46.0 %   MCV 83.4 78.0 - 100.0 fL   MCH 28.1 26.0 - 34.0 pg   MCHC 33.7 30.0 - 36.0 g/dL   RDW 14.3 11.5 - 15.5 %   Platelets 447 (H) 150 - 400 K/uL    Comment: Performed at [facility], [place] [address] [place], [place] [address]  Basic metabolic panel     Status: Abnormal   Collection Time: 04/05/17  5:09 AM  Result Value Ref Range   Sodium 139 135 - 145 mmol/L   Potassium 3.0 (L) 3.5 - 5.1 mmol/L   Chloride 110 101 - 111 mmol/L   CO2 20 (L) 22 - 32 mmol/L   Glucose, Bld 134 (H) 65 - 99 mg/dL   BUN <5 (L) 6 - 20 mg/dL   Creatinine, Ser 0.68 0.44 - 1.00 mg/dL   Calcium 8.2 (L) 8.9 - 10.3 mg/dL   GFR calc non Af Amer >60 >60 mL/min   GFR calc Af Amer >60 >60 mL/min    Comment: (NOTE) The eGFR has been calculated using the CKD EPI equation. This calculation has not been validated in all clinical situations. eGFR's persistently <60 mL/min signify possible Chronic Kidney Disease.    Anion gap 9 5 - 15    Comment: Performed at [facility], [place] [address], [place], [place] [address]    Imaging / Studies: Nm Hepatobiliary Liver Func  Result Date: 04/03/2017  CLINICAL DATA:  History of cholecystectomy and bile leak. EXAM: NUCLEAR MEDICINE HEPATOBILIARY IMAGING TECHNIQUE: Sequential images of the abdomen were obtained out to 60 minutes following intravenous administration of radiopharmaceutical. RADIOPHARMACEUTICALS:  5.3 mCi Tc-37m Choletec IV COMPARISON:  03/12/2017 FINDINGS: Prompt and symmetric uptake in  the liver and normal excretion into the biliary tree. The gallbladder is surgically absent. Activity is seen in the small bowel by 15 minutes. No leak is identified. IMPRESSION: Resolution of bile leak. Normal hepatobiliary scan post cholecystectomy. Electronically Signed   By: PMarijo SanesM.D.   On: 04/03/2017 12:41   Ct Image Guided Drainage By Percutaneous Catheter  Result Date: 04/04/2017 INDICATION: History of cholecystectomy complicated by development of a multiloculated abscesses about the gallbladder fossa. Request made for placement of image guided percutaneous drainage catheter(s) for infection source control purposes. EXAM: ULTRASOUND AND CT-GUIDED HEPATIC ABSCESS DRAINAGE CATHETER PLACEMENT X2. COMPARISON:  CT abdomen and pelvis - 04/02/2017 MEDICATIONS: The patient is currently admitted to the hospital and receiving intravenous antibiotics. The antibiotics were administered within an appropriate time frame prior to the initiation of the procedure. ANESTHESIA/SEDATION: Moderate (conscious) sedation was employed during this procedure. A total of Versed 2 mg and Fentanyl 100 mcg was administered intravenously. Moderate Sedation Time: 30 minutes. The patient's level of consciousness and vital signs were monitored continuously by radiology nursing throughout the procedure under my direct supervision. CONTRAST:  None COMPLICATIONS: None immediate. PROCEDURE: Informed written consent was obtained from the patient after a discussion of the risks, benefits and alternatives to treatment. The patient was placed supine, slightly LPO on the CT gantry and a pre procedural CT was performed re-demonstrating the known abscess/fluid collection within the hepatic parenchyma adjacent to the gallbladder fossa with dominant ill-defined hypoattenuating component measuring approximately 3.1 x 2.5 cm (image 27, series 2) and additional dominant subcapsular component measuring approximately 3.3 x 1.9 cm (image 33, series  2). The procedure was planned. A timeout was performed prior to the initiation of the procedure. The skin overlying the right upper abdomen was prepped and draped in the usual sterile fashion. The overlying soft tissues were anesthetized with 1% lidocaine with epinephrine. Under direct ultrasound guidance, the dominant component within the more central aspect of the right lobe of the liver was accessed with an 18 gauge trocar needle. An ultrasound image was saved for procedural documentation purposes. A short Amplatz wire was coiled within the collection. Appropriate position was confirmed with a limited CT scan. Next, the track was serially dilated allowing placement of a 10 [place]-purpose drainage catheter. Postprocedural imaging demonstrates appropriate positioning of the percutaneous drainage catheter. Approximately 30 cc of purulent fluid was aspirated. Following evacuation of nearly all purulent material from the dominant component of the hepatic abscess, sonographic evaluation demonstrated persistent abscess about the subcapsular aspect the right lobe of the liver the decision was made to place an additional percutaneous drainage catheter. As such, under direct ultrasound guidance, the dominant component of the subcapsular collection was accessed with an 18 gauge trocar needle. An ultrasound image was saved for procedural documentation purposes. A short Amplatz wire was coiled within the collection. Appropriate position was confirmed with a limited CT scan. Next, the track was dilated allowing placement of a 10 [place]-purpose drainage catheter. Postprocedural imaging demonstrates appropriate positioning of the percutaneous drainage catheter. Approximately 10 cc of purulent fluid was aspirated. A sample of aspirated purulent debris was capped and sent to the laboratory for analysis. Both hepatic abscess drainage catheters were connected  to JP bulbs and sutured in place. Dressings were placed. The  patient tolerated the above procedures well without immediate post procedural complication. IMPRESSION: Successful ultrasound and CT-guided placement of two 10 French all-purpose drainage catheters into the dominant components of the multiloculated complex hepatic abscesses about the gallbladder fossa. Samples were sent to the laboratory as requested by the ordering clinical team. Electronically Signed   By: Sandi Mariscal M.D.   On: 04/04/2017 11:34   Ct Image Guided Drainage Percut Cath  Peritoneal Retroperit  Result Date: 04/04/2017 INDICATION: History of cholecystectomy complicated by development of a multiloculated abscesses about the gallbladder fossa. Request made for placement of image guided percutaneous drainage catheter(s) for infection source control purposes. EXAM: ULTRASOUND AND CT-GUIDED HEPATIC ABSCESS DRAINAGE CATHETER PLACEMENT X2. COMPARISON:  CT abdomen and pelvis - 04/02/2017 MEDICATIONS: The patient is currently admitted to the hospital and receiving intravenous antibiotics. The antibiotics were administered within an appropriate time frame prior to the initiation of the procedure. ANESTHESIA/SEDATION: Moderate (conscious) sedation was employed during this procedure. A total of Versed 2 mg and Fentanyl 100 mcg was administered intravenously. Moderate Sedation Time: 30 minutes. The patient's level of consciousness and vital signs were monitored continuously by radiology nursing throughout the procedure under my direct supervision. CONTRAST:  None COMPLICATIONS: None immediate. PROCEDURE: Informed written consent was obtained from the patient after a discussion of the risks, benefits and alternatives to treatment. The patient was placed supine, slightly LPO on the CT gantry and a pre procedural CT was performed re-demonstrating the known abscess/fluid collection within the hepatic parenchyma adjacent to the gallbladder fossa with dominant ill-defined hypoattenuating component measuring  approximately 3.1 x 2.5 cm (image 27, series 2) and additional dominant subcapsular component measuring approximately 3.3 x 1.9 cm (image 33, series 2). The procedure was planned. A timeout was performed prior to the initiation of the procedure. The skin overlying the right upper abdomen was prepped and draped in the usual sterile fashion. The overlying soft tissues were anesthetized with 1% lidocaine with epinephrine. Under direct ultrasound guidance, the dominant component within the more central aspect of the right lobe of the liver was accessed with an 18 gauge trocar needle. An ultrasound image was saved for procedural documentation purposes. A short Amplatz wire was coiled within the collection. Appropriate position was confirmed with a limited CT scan. Next, the track was serially dilated allowing placement of a 10 [place] all-purpose drainage catheter. Postprocedural imaging demonstrates appropriate positioning of the percutaneous drainage catheter. Approximately 30 cc of purulent fluid was aspirated. Following evacuation of nearly all purulent material from the dominant component of the hepatic abscess, sonographic evaluation demonstrated persistent abscess about the subcapsular aspect the right lobe of the liver the decision was made to place an additional percutaneous drainage catheter. As such, under direct ultrasound guidance, the dominant component of the subcapsular collection was accessed with an 18 gauge trocar needle. An ultrasound image was saved for procedural documentation purposes. A short Amplatz wire was coiled within the collection. Appropriate position was confirmed with a limited CT scan. Next, the track was dilated allowing placement of a 10 [place] all-purpose drainage catheter. Postprocedural imaging demonstrates appropriate positioning of the percutaneous drainage catheter. Approximately 10 cc of purulent fluid was aspirated. A sample of aspirated purulent debris was capped and sent to  the laboratory for analysis. Both hepatic abscess drainage catheters were connected to JP bulbs and sutured in place. Dressings were placed. The patient tolerated the above procedures well without immediate post procedural  complication. IMPRESSION: Successful ultrasound and CT-guided placement of two 10 French all-purpose drainage catheters into the dominant components of the multiloculated complex hepatic abscesses about the gallbladder fossa. Samples were sent to the laboratory as requested by the ordering clinical team. Electronically Signed   By: Sandi Mariscal M.D.   On: 04/04/2017 11:34    Medications / Allergies: per chart  Antibiotics: Anti-infectives (From admission, onward)   Start     Dose/Rate Route Frequency Ordered Stop   04/04/17 0800  metroNIDAZOLE (FLAGYL) IVPB 500 mg     500 mg 100 mL/hr over 60 Minutes Intravenous Every 8 hours 04/04/17 0735     04/02/17 2345  ciprofloxacin (CIPRO) IVPB 400 mg     400 mg 200 mL/hr over 60 Minutes Intravenous Every 12 hours 04/02/17 2338     04/02/17 1415  levofloxacin (LEVAQUIN) IVPB 750 mg     750 mg 100 mL/hr over 90 Minutes Intravenous  Once 04/02/17 1407 04/02/17 1550   04/02/17 1415  aztreonam (AZACTAM) 2 g in sodium chloride 0.9 % 100 mL IVPB     2 g 200 mL/hr over 30 Minutes Intravenous  Once 04/02/17 1407 04/02/17 1600   04/02/17 1415  vancomycin (VANCOCIN) IVPB 1000 mg/200 mL premix     1,000 mg 200 mL/hr over 60 Minutes Intravenous  Once 04/02/17 1407 04/02/17 1630        Note: Portions of this report may have been transcribed using voice recognition software. Every effort was made to ensure accuracy; however, inadvertent computerized transcription errors may be present.   Any transcriptional errors that result from this process are unintentional.     [name], M.D., F.A.C.S. Gastrointestinal and Minimally Invasive Surgery [facility] [address], [place], [place]  [address] [phone] Main / Paging   04/05/2017

## 2017-04-06 LAB — BASIC METABOLIC PANEL
Anion gap: 10 (ref 5–15)
BUN: <5 mg/dL — ABNORMAL LOW (ref 6–20)
CO2: 20 mmol/L — ABNORMAL LOW (ref 22–32)
Calcium: 8.9 mg/dL (ref 8.9–10.3)
Chloride: 111 mmol/L (ref 101–111)
Creatinine, Ser: 0.67 mg/dL (ref 0.44–1.00)
GFR calc Af Amer: >60 mL/min (ref >60)
GFR calc non Af Amer: >60 mL/min (ref >60)
Glucose, Bld: 107 mg/dL — ABNORMAL HIGH (ref 65–99)
Potassium: 4.2 mmol/L (ref 3.5–5.1)
Sodium: 141 mmol/L (ref 135–145)

## 2017-04-06 LAB — CBC
HCT: 32.1 % — ABNORMAL LOW (ref 36.0–46.0)
Hemoglobin: 10.5 g/dL — ABNORMAL LOW (ref 12.0–15.0)
MCH: 27.3 pg (ref 26.0–34.0)
MCHC: 32.7 g/dL (ref 30.0–36.0)
MCV: 83.6 fL (ref 78.0–100.0)
Platelets: 550 10*3/uL — ABNORMAL HIGH (ref 150–400)
RBC: 3.84 MIL/uL — ABNORMAL LOW (ref 3.87–5.11)
RDW: 14.4 % (ref 11.5–15.5)
WBC: 6.9 10*3/uL (ref 4.0–10.5)

## 2017-04-06 LAB — GLUCOSE, CAPILLARY
Glucose-Capillary: 117 mg/dL — ABNORMAL HIGH (ref 65–99)
Glucose-Capillary: 114 mg/dL — ABNORMAL HIGH (ref 65–99)
Glucose-Capillary: 116 mg/dL — ABNORMAL HIGH (ref 65–99)
Glucose-Capillary: 139 mg/dL — ABNORMAL HIGH (ref 65–99)

## 2017-04-06 LAB — MAGNESIUM: Magnesium: 1.8 mg/dL (ref 1.7–2.4)

## 2017-04-06 NOTE — Progress Notes (Signed)
CC:  Abdominal pain, fever  Subjective: Pain is better, tolerating liquids  Objective: Vital signs in last 24 hours: Temp:  [99.3 F (37.4 C)-100.3 F (37.9 C)] 99.3 F (37.4 C) (02/16 0451) Pulse Rate:  [96-102] 102 (02/16 0451) Resp:  [18] 18 (02/16 0451) BP: (124-149)/(62-90) 124/62 (02/16 1055) SpO2:  [97 %-98 %] 97 % (02/16 0451) Last BM Date: 04/06/17   Intake/Output from previous day: 02/15 0701 - 02/16 0700 In: 1960 [P.O.:360; I.V.:600; IV Piggyback:1000] Out: 32 [Drains:32] Intake/Output this shift: Total I/O In: 2330 [P.O.:480; I.V.:1350; IV Piggyback:500] Out: -   General appearance: alert, cooperative and no distress Resp: clear to auscultation bilaterally GI: she says she has discomfort RUQ and mid epigastric area, but not all that tender on palpation.    Lab Results:  Recent Labs    04/05/17 0509 04/06/17 0437  WBC 8.4 6.9  HGB 9.0* 10.5*  HCT 26.7* 32.1*  PLT 447* 550*    BMET Recent Labs    04/05/17 0509 04/06/17 0437  NA 139 141  K 3.0* 4.2  CL 110 111  CO2 20* 20*  GLUCOSE 134* 107*  BUN <5* <5*  CREATININE 0.68 0.67  CALCIUM 8.2* 8.9   PT/INR Recent Labs    04/04/17 0409  LABPROT 15.5*  INR 1.24    Recent Labs  Lab 04/02/17 1337 04/03/17 0021  AST 31 29  ALT 24 20  ALKPHOS 126 103  BILITOT 1.1 0.5  PROT 8.6* 7.0  ALBUMIN 3.0* 2.5*     Lipase     Component Value Date/Time   LIPASE 31 04/02/2017 1337     Medications: . acetaminophen  1,000 mg Oral TID  . aspirin EC  81 mg Oral Daily  . atorvastatin  40 mg Oral Daily  . enoxaparin (LOVENOX) injection  40 mg Subcutaneous Q24H  . gabapentin  300 mg Oral QHS  . insulin aspart  0-15 Units Subcutaneous TID AC & HS  . lactose free nutrition  237 mL Oral BID BM  . losartan  25 mg Oral Daily  . pantoprazole  40 mg Oral Daily  . potassium chloride  40 mEq Oral TID  . sodium chloride flush  3 mL Intravenous Q12H  . sodium chloride flush  5 mL Intracatheter Q8H    Anti-infectives (From admission, onward)   Start     Dose/Rate Route Frequency Ordered Stop   04/04/17 0800  metroNIDAZOLE (FLAGYL) IVPB 500 mg     500 mg 100 mL/hr over 60 Minutes Intravenous Every 8 hours 04/04/17 0735     04/02/17 2345  ciprofloxacin (CIPRO) IVPB 400 mg     400 mg 200 mL/hr over 60 Minutes Intravenous Every 12 hours 04/02/17 2338     04/02/17 1415  levofloxacin (LEVAQUIN) IVPB 750 mg     750 mg 100 mL/hr over 90 Minutes Intravenous  Once 04/02/17 1407 04/02/17 1550   04/02/17 1415  aztreonam (AZACTAM) 2 g in sodium chloride 0.9 % 100 mL IVPB     2 g 200 mL/hr over 30 Minutes Intravenous  Once 04/02/17 1407 04/02/17 1600   04/02/17 1415  vancomycin (VANCOCIN) IVPB 1000 mg/200 mL premix     1,000 mg 200 mL/hr over 60 Minutes Intravenous  Once 04/02/17 1407 04/02/17 1630       Assessment/Plan HTN/tachycardia - cozaar DM - SSI HLD - lipitor GERD - protonix  Acute on Chronic Calculus Cholecystitis S/p lap chole with IOC and ligation of biliary duct of Luschka  1/19 by Dr. Johney Maine S/p ERCP with CBD stent placement 1/24 for bile leak Readmitted 2/12 with n/v - CT showed complex fluid collections along gallbladder fossa 5.4 x 6.2 x 4.3 cm in aggregate - HIDA 2/13:  Resolution of bile leak. Normal hepatobiliary scan post cholecystectomy.  ID - cipro 2/12 =>> day 4, flagyl day 2 FEN - Fulls VTE - SCDs, lovenox Foley - none Follow up - Dr. Johney Maine   Plan:  Cont drains per IR.  Cont abx.  Pt would like to stay on FLD       LOS: 4 days    Whitney Berenguer C. 8/37/5423 [phone]

## 2017-04-06 NOTE — Progress Notes (Signed)
Patient ID: Whitney Neal, female   DOB: [birth year], 57 y.o.   MRN: [id number]    Referring Physician(s): Dr. Michael Boston  Supervising Physician: Daryll Brod  Patient Status: [facility] - In-pt  Chief Complaint: Perihepatic abscess x2  Subjective: Patient feeling much better.  Pain is minimal.  Allergies: Codeine; Morphine and related; and Rocephin [ceftriaxone sodium in dextrose]  Medications: Prior to Admission medications   Medication Sig Start Date End Date Taking? Authorizing Provider  aspirin 81 MG EC tablet Take 81 mg by mouth daily.     Yes [provider]  atorvastatin (LIPITOR) 40 MG tablet Take 1 tablet (40 mg total) by mouth daily. 02/08/16  Yes Emeterio Reeve, DO  docusate sodium (COLACE) 100 MG capsule Take 100 mg by mouth daily as needed for mild constipation.   Yes [provider]  fluticasone (FLONASE) 50 MCG/ACT nasal spray Place 1 spray into both nostrils daily as needed for allergies or rhinitis.   Yes [provider]  losartan (COZAAR) 25 MG tablet Take 1 tablet (25 mg total) daily by mouth. 12/27/16  Yes Emeterio Reeve, DO  metFORMIN (GLUCOPHAGE XR) 500 MG 24 hr tablet Take 1 tablet (500 mg total) by mouth daily with breakfast. 08/23/16 08/23/17 Yes Emeterio Reeve, DO  methylPREDNISolone (MEDROL) 4 MG tablet Take 4 mg by mouth. TAKE AS DIRECTED ON PACKAGE 03/28/17  Yes [provider]  Nutritional Supplements (BOOST PO) Take 1 Can by mouth daily.   Yes [provider]  omeprazole (PRILOSEC) 40 MG capsule Take 1 capsule (40 mg total) by mouth daily. 08/27/16  Yes Emeterio Reeve, DO  oxyCODONE (OXY IR/ROXICODONE) 5 MG immediate release tablet Take 1 tablet (5 mg total) by mouth every 4 (four) hours as needed for severe pain or breakthrough pain. 03/15/17  Yes Meuth, Brooke A, PA-C  sodium bicarbonate 325 MG tablet Take 1-2 tablets (325-650 mg total) by mouth 2 (two) times daily. Prn heartburn Patient not taking:  Reported on 04/02/2017 08/27/16   Emeterio Reeve, DO  sucralfate (CARAFATE) 1 GM/10ML suspension Take 10 mLs (1 g total) 4 (four) times daily -  with meals and at bedtime by mouth. Patient not taking: Reported on 04/02/2017 12/28/16   Emeterio Reeve, DO    Vital Signs: BP (!) 117/95 (BP Location: Left Arm)   Pulse 83   Temp 98.9 F (37.2 C) (Oral)   Resp (!) 25   Ht 5' (1.524 m)   Wt 140 lb (63.5 kg)   SpO2 99%   BMI 27.34 kg/m   Physical Exam: Abd: soft, minimally tender, 2 RUQ drains.  Drain 1 15cc and drain 2 with 20cc.  Drain site is c/d/i.  Drains just emptied so no current output.  Imaging: Dg Chest 2 View  Result Date: 04/02/2017 CLINICAL DATA:  Fever.  Recent drain removal. EXAM: CHEST  2 VIEW COMPARISON:  One-view chest x-ray 03/14/2017 FINDINGS: Heart size is normal. Lung volumes are low. Bibasilar airspace disease likely reflects atelectasis. The visualized soft tissues and bony thorax are unremarkable. IMPRESSION: Low lung volumes with bibasilar airspace disease, likely reflecting atelectasis. Electronically Signed   By: San Morelle M.D.   On: 04/02/2017 15:30   Nm Hepatobiliary Liver Func  Result Date: 04/03/2017 CLINICAL DATA:  History of cholecystectomy and bile leak. EXAM: NUCLEAR MEDICINE HEPATOBILIARY IMAGING TECHNIQUE: Sequential images of the abdomen were obtained out to 60 minutes following intravenous administration of radiopharmaceutical. RADIOPHARMACEUTICALS:  5.3 mCi Tc-63m  Choletec IV COMPARISON:  03/12/2017 FINDINGS: Prompt  and symmetric uptake in the liver and normal excretion into the biliary tree. The gallbladder is surgically absent. Activity is seen in the small bowel by 15 minutes. No leak is identified. IMPRESSION: Resolution of bile leak. Normal hepatobiliary scan post cholecystectomy. Electronically Signed   By: Marijo Sanes M.D.   On: 04/03/2017 12:41   Ct Abdomen Pelvis W Contrast  Result Date: 04/02/2017 CLINICAL DATA:  Status post  cholecystectomy on 03/09/2017, with nausea/vomiting and fever status post JP drain removal on 03/22/2017. EXAM: CT ABDOMEN AND PELVIS WITH CONTRAST TECHNIQUE: Multidetector CT imaging of the abdomen and pelvis was performed using the standard protocol following bolus administration of intravenous contrast. CONTRAST:  100 mL Isovue 300 IV COMPARISON:  None. FINDINGS: Lower chest: Mild patchy opacities in the right lower lobe (series 7/image 19), likely atelectasis, pneumonia not excluded. Hepatobiliary: No focal hepatic lesions. Post cholecystectomy. Complex fluid collection along the inferior aspect of the liver/gallbladder fossa, with 3 distinct components, measuring 5.4 x 6.2 x 4.3 cm in aggregate. Dominant component along the posterior gallbladder fossa measures 2.8 cm (series 2/image 22). Dominant component along the anterior/lateral gallbladder fossa measures 2.7 cm (series 2/image 25). Dominant component medially in the gallbladder fossa extends along the wall of the gastric antrum, where it measures 3.8 cm (series 2/image 27). No intrahepatic or extrahepatic ductal dilatation. Pancreas: Within normal limits. Spleen: Within normal limits. Adrenals/Urinary Tract: Adrenal glands are within normal limits. Kidneys are within normal limits.  No hydronephrosis. Bladder is within normal limits, noting a tiny focus of nondependent gas. Stomach/Bowel: Stomach is within normal limits. Moderate duodenal diverticulum. No evidence of bowel obstruction. Normal appendix (series 2/image 65). Vascular/Lymphatic: No evidence of abdominal aortic aneurysm. No suspicious abdominopelvic lymphadenopathy. Reproductive: Status post hysterectomy. No adnexal masses. Other: No abdominopelvic ascites. Mild postsurgical changes along the anterior abdominal wall. Musculoskeletal: Visualized osseous structures are within normal limits. IMPRESSION: Status post cholecystectomy. Complex fluid collections along the gallbladder fossa, as  described above, measuring 5.4 x 6.2 x 4.3 cm in aggregate. These collections extend along the inferior aspect of the liver and into the wall of the gastric antrum. Presumably, this appearance is related to the patient's history of bile leak, although superimposed infection/abscess is not excluded. Mild patchy right lower lobe opacities, likely atelectasis, pneumonia not excluded. Electronically Signed   By: Julian Hy M.D.   On: 04/02/2017 15:54   Ct Image Guided Drainage By Percutaneous Catheter  Result Date: 04/04/2017 INDICATION: History of cholecystectomy complicated by development of a multiloculated abscesses about the gallbladder fossa. Request made for placement of image guided percutaneous drainage catheter(s) for infection source control purposes. EXAM: ULTRASOUND AND CT-GUIDED HEPATIC ABSCESS DRAINAGE CATHETER PLACEMENT X2. COMPARISON:  CT abdomen and pelvis - 04/02/2017 MEDICATIONS: The patient is currently admitted to the hospital and receiving intravenous antibiotics. The antibiotics were administered within an appropriate time frame prior to the initiation of the procedure. ANESTHESIA/SEDATION: Moderate (conscious) sedation was employed during this procedure. A total of Versed 2 mg and Fentanyl 100 mcg was administered intravenously. Moderate Sedation Time: 30 minutes. The patient's level of consciousness and vital signs were monitored continuously by radiology nursing throughout the procedure under my direct supervision. CONTRAST:  None COMPLICATIONS: None immediate. PROCEDURE: Informed written consent was obtained from the patient after a discussion of the risks, benefits and alternatives to treatment. The patient was placed supine, slightly LPO on the CT gantry and a pre procedural CT was performed re-demonstrating the known abscess/fluid collection within the hepatic parenchyma adjacent  to the gallbladder fossa with dominant ill-defined hypoattenuating component measuring approximately  3.1 x 2.5 cm (image 27, series 2) and additional dominant subcapsular component measuring approximately 3.3 x 1.9 cm (image 33, series 2). The procedure was planned. A timeout was performed prior to the initiation of the procedure. The skin overlying the right upper abdomen was prepped and draped in the usual sterile fashion. The overlying soft tissues were anesthetized with 1% lidocaine with epinephrine. Under direct ultrasound guidance, the dominant component within the more central aspect of the right lobe of the liver was accessed with an 18 gauge trocar needle. An ultrasound image was saved for procedural documentation purposes. A short Amplatz wire was coiled within the collection. Appropriate position was confirmed with a limited CT scan. Next, the track was serially dilated allowing placement of a 10 [place] all-purpose drainage catheter. Postprocedural imaging demonstrates appropriate positioning of the percutaneous drainage catheter. Approximately 30 cc of purulent fluid was aspirated. Following evacuation of nearly all purulent material from the dominant component of the hepatic abscess, sonographic evaluation demonstrated persistent abscess about the subcapsular aspect the right lobe of the liver the decision was made to place an additional percutaneous drainage catheter. As such, under direct ultrasound guidance, the dominant component of the subcapsular collection was accessed with an 18 gauge trocar needle. An ultrasound image was saved for procedural documentation purposes. A short Amplatz wire was coiled within the collection. Appropriate position was confirmed with a limited CT scan. Next, the track was dilated allowing placement of a 10 [place] all-purpose drainage catheter. Postprocedural imaging demonstrates appropriate positioning of the percutaneous drainage catheter. Approximately 10 cc of purulent fluid was aspirated. A sample of aspirated purulent debris was capped and sent to the laboratory  for analysis. Both hepatic abscess drainage catheters were connected to JP bulbs and sutured in place. Dressings were placed. The patient tolerated the above procedures well without immediate post procedural complication. IMPRESSION: Successful ultrasound and CT-guided placement of two 10 French all-purpose drainage catheters into the dominant components of the multiloculated complex hepatic abscesses about the gallbladder fossa. Samples were sent to the laboratory as requested by the ordering clinical team. Electronically Signed   By: Sandi Mariscal M.D.   On: 04/04/2017 11:34   Ct Image Guided Drainage Percut Cath  Peritoneal Retroperit  Result Date: 04/04/2017 INDICATION: History of cholecystectomy complicated by development of a multiloculated abscesses about the gallbladder fossa. Request made for placement of image guided percutaneous drainage catheter(s) for infection source control purposes. EXAM: ULTRASOUND AND CT-GUIDED HEPATIC ABSCESS DRAINAGE CATHETER PLACEMENT X2. COMPARISON:  CT abdomen and pelvis - 04/02/2017 MEDICATIONS: The patient is currently admitted to the hospital and receiving intravenous antibiotics. The antibiotics were administered within an appropriate time frame prior to the initiation of the procedure. ANESTHESIA/SEDATION: Moderate (conscious) sedation was employed during this procedure. A total of Versed 2 mg and Fentanyl 100 mcg was administered intravenously. Moderate Sedation Time: 30 minutes. The patient's level of consciousness and vital signs were monitored continuously by radiology nursing throughout the procedure under my direct supervision. CONTRAST:  None COMPLICATIONS: None immediate. PROCEDURE: Informed written consent was obtained from the patient after a discussion of the risks, benefits and alternatives to treatment. The patient was placed supine, slightly LPO on the CT gantry and a pre procedural CT was performed re-demonstrating the known abscess/fluid collection within  the hepatic parenchyma adjacent to the gallbladder fossa with dominant ill-defined hypoattenuating component measuring approximately 3.1 x 2.5 cm (image 27, series 2) and additional  dominant subcapsular component measuring approximately 3.3 x 1.9 cm (image 33, series 2). The procedure was planned. A timeout was performed prior to the initiation of the procedure. The skin overlying the right upper abdomen was prepped and draped in the usual sterile fashion. The overlying soft tissues were anesthetized with 1% lidocaine with epinephrine. Under direct ultrasound guidance, the dominant component within the more central aspect of the right lobe of the liver was accessed with an 18 gauge trocar needle. An ultrasound image was saved for procedural documentation purposes. A short Amplatz wire was coiled within the collection. Appropriate position was confirmed with a limited CT scan. Next, the track was serially dilated allowing placement of a 10 [place] all-purpose drainage catheter. Postprocedural imaging demonstrates appropriate positioning of the percutaneous drainage catheter. Approximately 30 cc of purulent fluid was aspirated. Following evacuation of nearly all purulent material from the dominant component of the hepatic abscess, sonographic evaluation demonstrated persistent abscess about the subcapsular aspect the right lobe of the liver the decision was made to place an additional percutaneous drainage catheter. As such, under direct ultrasound guidance, the dominant component of the subcapsular collection was accessed with an 18 gauge trocar needle. An ultrasound image was saved for procedural documentation purposes. A short Amplatz wire was coiled within the collection. Appropriate position was confirmed with a limited CT scan. Next, the track was dilated allowing placement of a 10 [place] all-purpose drainage catheter. Postprocedural imaging demonstrates appropriate positioning of the percutaneous drainage  catheter. Approximately 10 cc of purulent fluid was aspirated. A sample of aspirated purulent debris was capped and sent to the laboratory for analysis. Both hepatic abscess drainage catheters were connected to JP bulbs and sutured in place. Dressings were placed. The patient tolerated the above procedures well without immediate post procedural complication. IMPRESSION: Successful ultrasound and CT-guided placement of two 10 French all-purpose drainage catheters into the dominant components of the multiloculated complex hepatic abscesses about the gallbladder fossa. Samples were sent to the laboratory as requested by the ordering clinical team. Electronically Signed   By: Sandi Mariscal M.D.   On: 04/04/2017 11:34    Labs:  CBC: Recent Labs    04/03/17 0021 04/04/17 0409 04/05/17 0509 04/06/17 0437  WBC 9.0 9.6 8.4 6.9  HGB 9.5* 8.9* 9.0* 10.5*  HCT 28.4* 26.7* 26.7* 32.1*  PLT 472* 424* 447* 550*    COAGS: Recent Labs    04/04/17 0409  INR 1.24    BMP: Recent Labs    04/02/17 1337 04/03/17 0021 04/05/17 0509 04/06/17 0437  NA 135 134* 139 141  K 3.9 4.0 3.0* 4.2  CL 100* 104 110 111  CO2 20* 20* 20* 20*  GLUCOSE 123* 144* 134* 107*  BUN 15 10 <5* <5*  CALCIUM 9.4 8.5* 8.2* 8.9  CREATININE 0.71 0.72 0.68 0.67  GFRNONAA >60 >60 >60 >60  GFRAA >60 >60 >60 >60    LIVER FUNCTION TESTS: Recent Labs    03/11/17 0434 03/12/17 0534 04/02/17 1337 04/03/17 0021  BILITOT 1.0 0.8 1.1 0.5  AST 77* 41 31 29  ALT 143* 96* 24 20  ALKPHOS 104 95 126 103  PROT 6.9 6.6 8.6* 7.0  ALBUMIN 3.7 3.6 3.0* 2.5*    Assessment and Plan: 1. Perihepatic abscesses, s/p perc drains  Drains in good position and draining well. CX: ABUNDANT WBC PRESENT, PREDOMINANTLY PMN  ABUNDANT GRAM POSITIVE COCCI  MODERATE GRAM NEGATIVE RODS  MODERATE GRAM POSITIVE RODS   Cont drain irrigations. Will follow  Electronically Signed: Henreitta Cea 04/06/2017, 2:31 PM   I spent a total of 15  Minutes at the the patient's bedside AND on the patient's hospital floor or unit, greater than 50% of which was counseling/coordinating care for perihepatic abscesses

## 2017-04-07 LAB — CULTURE, BLOOD (ROUTINE X 2)
Culture: NO GROWTH
Special Requests: ADEQUATE

## 2017-04-07 LAB — GLUCOSE, CAPILLARY
Glucose-Capillary: 103 mg/dL — ABNORMAL HIGH (ref 65–99)
Glucose-Capillary: 123 mg/dL — ABNORMAL HIGH (ref 65–99)
Glucose-Capillary: 93 mg/dL (ref 65–99)
Glucose-Capillary: 120 mg/dL — ABNORMAL HIGH (ref 65–99)

## 2017-04-07 NOTE — Progress Notes (Signed)
CC:  Abdominal pain, fever  Subjective: Pain is better, tolerating full liquids. +Flatus/BM  Objective: Vital signs in last 24 hours: Temp:  [98.1 F (36.7 C)-99 F (37.2 C)] 99 F (37.2 C) (02/17 0530) Pulse Rate:  [83-88] 88 (02/17 0530) Resp:  [20-25] 20 (02/17 0530) BP: (117-125)/(62-95) 125/83 (02/17 0530) SpO2:  [98 %-100 %] 98 % (02/17 0530) Last BM Date: 04/06/17   Intake/Output from previous day: 02/16 0701 - 02/17 0700 In: 3170 [P.O.:805; I.V.:1655; IV Piggyback:700] Out: 23 [Drains:23] Intake/Output this shift: No intake/output data recorded.  General appearance: alert, cooperative and no distress Resp: clear to auscultation bilaterally GI: she says she has discomfort RUQ and mid epigastric area, but not all that tender on palpation.    Lab Results:  Recent Labs    04/05/17 0509 04/06/17 0437  WBC 8.4 6.9  HGB 9.0* 10.5*  HCT 26.7* 32.1*  PLT 447* 550*    BMET Recent Labs    04/05/17 0509 04/06/17 0437  NA 139 141  K 3.0* 4.2  CL 110 111  CO2 20* 20*  GLUCOSE 134* 107*  BUN <5* <5*  CREATININE 0.68 0.67  CALCIUM 8.2* 8.9   PT/INR No results for input(s): LABPROT, INR in the last 72 hours.  Recent Labs  Lab 04/02/17 1337 04/03/17 0021  AST 31 29  ALT 24 20  ALKPHOS 126 103  BILITOT 1.1 0.5  PROT 8.6* 7.0  ALBUMIN 3.0* 2.5*     Lipase     Component Value Date/Time   LIPASE 31 04/02/2017 1337     Medications: . acetaminophen  1,000 mg Oral TID  . aspirin EC  81 mg Oral Daily  . atorvastatin  40 mg Oral Daily  . enoxaparin (LOVENOX) injection  40 mg Subcutaneous Q24H  . gabapentin  300 mg Oral QHS  . insulin aspart  0-15 Units Subcutaneous TID AC & HS  . lactose free nutrition  237 mL Oral BID BM  . losartan  25 mg Oral Daily  . pantoprazole  40 mg Oral Daily  . potassium chloride  40 mEq Oral TID  . sodium chloride flush  3 mL Intravenous Q12H  . sodium chloride flush  5 mL Intracatheter Q8H   Anti-infectives (From  admission, onward)   Start     Dose/Rate Route Frequency Ordered Stop   04/04/17 0800  metroNIDAZOLE (FLAGYL) IVPB 500 mg     500 mg 100 mL/hr over 60 Minutes Intravenous Every 8 hours 04/04/17 0735     04/02/17 2345  ciprofloxacin (CIPRO) IVPB 400 mg     400 mg 200 mL/hr over 60 Minutes Intravenous Every 12 hours 04/02/17 2338     04/02/17 1415  levofloxacin (LEVAQUIN) IVPB 750 mg     750 mg 100 mL/hr over 90 Minutes Intravenous  Once 04/02/17 1407 04/02/17 1550   04/02/17 1415  aztreonam (AZACTAM) 2 g in sodium chloride 0.9 % 100 mL IVPB     2 g 200 mL/hr over 30 Minutes Intravenous  Once 04/02/17 1407 04/02/17 1600   04/02/17 1415  vancomycin (VANCOCIN) IVPB 1000 mg/200 mL premix     1,000 mg 200 mL/hr over 60 Minutes Intravenous  Once 04/02/17 1407 04/02/17 1630       Assessment/Plan HTN/tachycardia - cozaar DM - SSI HLD - lipitor GERD - protonix  Acute on Chronic Calculus Cholecystitis S/p lap chole with IOC and ligation of biliary duct of [place] 1/19 by Dr. Johney Maine S/p ERCP with CBD stent placement 1/24  for bile leak Readmitted 2/12 with n/v - CT showed complex fluid collections along gallbladder fossa 5.4 x 6.2 x 4.3 cm in aggregate - HIDA 2/13:  Resolution of bile leak. Normal hepatobiliary scan post cholecystectomy.  ID - cipro 2/12 =>> day 5, flagyl day 3 FEN - Regular diet VTE - SCDs, lovenox Foley - none Follow up - Dr. Johney Maine  Plan:  Cont drains per IR.  Cont abx.  Advanced to regular diet. Possible D/C tomorrow if reliably tolerates diet     LOS: 5 days    Ileana Roup 04/07/2017

## 2017-04-08 LAB — CULTURE, BLOOD (ROUTINE X 2)
Culture: NO GROWTH
Special Requests: ADEQUATE

## 2017-04-08 LAB — GLUCOSE, CAPILLARY
Glucose-Capillary: 95 mg/dL (ref 65–99)
Glucose-Capillary: 119 mg/dL — ABNORMAL HIGH (ref 65–99)

## 2017-04-08 MED ORDER — IBUPROFEN 200 MG PO TABS: ORAL_TABLET | ORAL | Status: DC

## 2017-04-08 MED ORDER — METRONIDAZOLE 500 MG PO TABS: 500.0000 mg | ORAL_TABLET | Freq: Three times a day (TID) | ORAL | Status: DC

## 2017-04-08 MED ORDER — LORAZEPAM 0.5 MG PO TABS: 0.5000 mg | ORAL_TABLET | Freq: Three times a day (TID) | ORAL | Status: DC | PRN

## 2017-04-08 MED ORDER — METRONIDAZOLE IN NACL 5-0.79 MG/ML-% IV SOLN: 500.0000 mg | Freq: Three times a day (TID) | INTRAVENOUS | Status: DC

## 2017-04-08 MED ORDER — ZOLPIDEM TARTRATE 5 MG PO TABS: 5.0000 mg | ORAL_TABLET | Freq: Every evening | ORAL | Status: DC | PRN

## 2017-04-08 MED ORDER — ACETAMINOPHEN 500 MG PO TABS: ORAL_TABLET | ORAL | 0 refills | Status: DC

## 2017-04-08 MED ORDER — METRONIDAZOLE 500 MG PO TABS: 500.0000 mg | ORAL_TABLET | Freq: Three times a day (TID) | ORAL | 0 refills | Status: DC

## 2017-04-08 MED ORDER — SODIUM CHLORIDE 0.9% FLUSH: INTRAVENOUS | 0 refills | Status: DC

## 2017-04-08 MED ORDER — CIPROFLOXACIN HCL 500 MG PO TABS: 500.0000 mg | ORAL_TABLET | Freq: Two times a day (BID) | ORAL | 0 refills | Status: DC

## 2017-04-08 MED ORDER — OXYCODONE HCL 5 MG PO TABS: ORAL_TABLET | ORAL | 0 refills | Status: DC

## 2017-04-08 MED ORDER — CIPROFLOXACIN HCL 500 MG PO TABS: 500.0000 mg | ORAL_TABLET | Freq: Two times a day (BID) | ORAL | Status: DC

## 2017-04-08 NOTE — Discharge Planning (Signed)
Patient IV removed. RN assessment and VS revealed stability for DC to home. Discharge papers printed, explained and educated.  No FU appts needed at this time Scripts printed and given to patient. Drains flushed  And dressing changed just prior to DC to home. Wheeled down to front and family transporting home via car.

## 2017-04-08 NOTE — Progress Notes (Signed)
Referring Physician(s): Gross,S  Supervising Physician: Jacqulynn Cadet  Patient Status:  [facility] - In-pt  Chief Complaint:  Hepatic abscesses  Subjective: Pt doing ok; denies worsening abd pain,N/V; tolerating diet and ambulating ok   Allergies: Codeine; Morphine and related; and Rocephin [ceftriaxone sodium in dextrose]  Medications: Prior to Admission medications   Medication Sig Start Date End Date Taking? Authorizing Provider  aspirin 81 MG EC tablet Take 81 mg by mouth daily.     Yes [provider]  atorvastatin (LIPITOR) 40 MG tablet Take 1 tablet (40 mg total) by mouth daily. 02/08/16  Yes Emeterio Reeve, DO  docusate sodium (COLACE) 100 MG capsule Take 100 mg by mouth daily as needed for mild constipation.   Yes [provider]  fluticasone (FLONASE) 50 MCG/ACT nasal spray Place 1 spray into both nostrils daily as needed for allergies or rhinitis.   Yes [provider]  losartan (COZAAR) 25 MG tablet Take 1 tablet (25 mg total) daily by mouth. 12/27/16  Yes Emeterio Reeve, DO  metFORMIN (GLUCOPHAGE XR) 500 MG 24 hr tablet Take 1 tablet (500 mg total) by mouth daily with breakfast. 08/23/16 08/23/17 Yes Emeterio Reeve, DO  methylPREDNISolone (MEDROL) 4 MG tablet Take 4 mg by mouth. TAKE AS DIRECTED ON PACKAGE 03/28/17  Yes [provider]  Nutritional Supplements (BOOST PO) Take 1 Can by mouth daily.   Yes [provider]  omeprazole (PRILOSEC) 40 MG capsule Take 1 capsule (40 mg total) by mouth daily. 08/27/16  Yes Emeterio Reeve, DO  oxyCODONE (OXY IR/ROXICODONE) 5 MG immediate release tablet Take 1 tablet (5 mg total) by mouth every 4 (four) hours as needed for severe pain or breakthrough pain. 03/15/17  Yes Meuth, Brooke A, PA-C  sodium bicarbonate 325 MG tablet Take 1-2 tablets (325-650 mg total) by mouth 2 (two) times daily. Prn heartburn Patient not taking: Reported on 04/02/2017 08/27/16   Emeterio Reeve, DO    sucralfate (CARAFATE) 1 GM/10ML suspension Take 10 mLs (1 g total) 4 (four) times daily -  with meals and at bedtime by mouth. Patient not taking: Reported on 04/02/2017 12/28/16   Emeterio Reeve, DO     Vital Signs: BP 122/82 (BP Location: Left Arm)   Pulse 90   Temp 98.9 F (37.2 C) (Oral)   Resp 20   Ht 5' (1.524 m)   Wt 140 lb (63.5 kg)   SpO2 98%   BMI 27.34 kg/m   Physical Exam: hepatic drains intact, outputs 25/40 cc, cx pend; insertion sites mildly tender  Imaging: No results found.  Labs:  CBC: Recent Labs    04/03/17 0021 04/04/17 0409 04/05/17 0509 04/06/17 0437  WBC 9.0 9.6 8.4 6.9  HGB 9.5* 8.9* 9.0* 10.5*  HCT 28.4* 26.7* 26.7* 32.1*  PLT 472* 424* 447* 550*    COAGS: Recent Labs    04/04/17 0409  INR 1.24    BMP: Recent Labs    04/02/17 1337 04/03/17 0021 04/05/17 0509 04/06/17 0437  NA 135 134* 139 141  K 3.9 4.0 3.0* 4.2  CL 100* 104 110 111  CO2 20* 20* 20* 20*  GLUCOSE 123* 144* 134* 107*  BUN 15 10 <5* <5*  CALCIUM 9.4 8.5* 8.2* 8.9  CREATININE 0.71 0.72 0.68 0.67  GFRNONAA >60 >60 >60 >60  GFRAA >60 >60 >60 >60    LIVER FUNCTION TESTS: Recent Labs    03/11/17 0434 03/12/17 0534 04/02/17 1337 04/03/17 0021  BILITOT 1.0 0.8 1.1 0.5  AST 77* 41 31 29  ALT 143* 96* 24 20  ALKPHOS 104 95 126 103  PROT 6.9 6.6 8.6* 7.0  ALBUMIN 3.7 3.6 3.0* 2.5*    Assessment and Plan: S/p lap chole 03/09/17 with bile leak/sphincterotomy/stent placement; now with hepatic abscesses, s/p drains x2 RUQ 2/14; afebrile; no new lab data; drain fluid cx pend; as OP rec once daily irrigation of drains with 5 cc sterile NS, output recording and dressing changes every 1-2 days; will set up for IR drain clinic appt in next 1-2 weeks     Electronically Signed: D. Rowe Robert, PA-C 04/08/2017, 2:47 PM   I spent a total of 15 minutes at the the patient's bedside AND on the patient's hospital floor or unit, greater than 50% of which was  counseling/coordinating care for hepatic abscess drains    Patient ID: Whitney Neal, female   DOB: [birth year], 57 y.o.   MRN: [id number]

## 2017-04-08 NOTE — Discharge Instructions (Signed)
Percutaneous Abscess Drain Percutaneous abscess drain is removal of a collection of infected fluid inside the body (abscess). This is done by placing a thin needle under the skin and moving it into the abscess. A small tube (catheter) is inserted during the procedure and left in place for a few days to continue to drain the abscess. Tell a health care provider about:  Any allergies you have.  All medicines you are taking, including vitamins, herbs, eye drops, creams, and over-the-counter medicines.  Any problems you or family members have had with anesthetic medicines.  Any blood disorders you have.  Any surgeries you have had.  Any medical conditions you have.  Whether you are pregnant or may be pregnant.  Any history of tobacco use or smoking. What are the risks? Generally, this is a safe procedure. However, problems may occur, including:  Infection.  Bleeding.  Allergic reaction to medicines or materials used.  Damage to other structures or organs.  Blockage of the catheter, requiring placement of a new catheter.  A need to repeat the procedure.  Failure of the procedure to drain the abscess completely, requiring an open surgical procedure to drain the abscess. An open procedure is done through a larger incision.  What happens before the procedure? Medicines  Ask your health care provider about: ? Changing or stopping your regular medicines. This is especially important if you are taking diabetes medicines or blood thinners. ? Taking medicines such as aspirin and ibuprofen. These medicines can thin your blood. Do not take these medicines before your procedure if your health care provider instructs you not to. Staying hydrated Follow instructions from your health care provider about hydration, which may include:  Up to 2 hours before the procedure - you may continue to drink clear liquids, such as water, clear fruit juice, black coffee, and plain tea.  Eating and  drinking restrictions Follow instructions from your health care provider about eating and drinking, which may include:  8 hours before the procedure - stop eating heavy meals or foods such as meat, fried foods, or fatty foods.  6 hours before the procedure - stop eating light meals or foods, such as toast or cereal.  6 hours before the procedure - stop drinking milk or drinks that contain milk.  2 hours before the procedure - stop drinking clear liquids.  General instructions   Plan to have someone take you home from the hospital or clinic.  If you will be going home right after the procedure, plan to have someone with you for 24 hours.  You may have blood tests or urine tests.  You may get a tetanus shot.  You may have imaging tests, such as an ultrasound, to check how large or deep your abscess is. What happens during the procedure?  To lower your risk of infection: ? Your health care team will wash or sanitize their hands. ? The skin around the abscess will be washed with soap. ? Hair may be removed from the surgical site.  An IV tube will be inserted into one of your veins.  You will be given medicine to numb the area (local anesthetic) where the catheter will be placed. Placement of the catheter varies depending on where your abscess is located.  You may be given medicine to help you relax (sedative) or medicine to make you fall asleep (general anesthetic).  A small incision will be made in your skin.  A needle will be inserted under your skin and moved  into the abscess. Images from ultrasound, X-ray, or a CT scan will be used to help guide the needle to the abscess.  A catheter will be inserted into your incision and moved underneath your skin until it reaches the abscess. Images will be used to help guide the catheter to the abscess.  After the catheter is in place, the needle will be removed. The catheter will be connected to a bag outside of your body. The catheter  will stay in place until the fluid has stopped draining and the infection is gone. The procedure may vary among health care providers and hospitals. What happens after the procedure?  Your blood pressure, heart rate, breathing rate, and blood oxygen level will be monitored until the medicines you were given have worn off.  You may have some pain or nausea. Medicines will be available to help you. Summary  An abscess is a collection of infected fluid inside the body.  During this procedure, images from ultrasound, X-rays, or a CT scan are used to help guide the needle and catheter to the abscess.  A catheter will be left in place to continue to drain the abscess after the procedure. This information is not intended to replace advice given to you by your health care provider. Make sure you discuss any questions you have with your health care provider. Document Released: 06/22/2013 Document Revised: 12/29/2015 Document Reviewed: 12/29/2015 Elsevier Interactive Patient Education  2017 [employer].

## 2017-04-08 NOTE — Progress Notes (Signed)
CC:  Abdominal pain, fever  Subjective: Pain is better, tolerating solid diet. +Flatus/BM  Objective: Vital signs in last 24 hours: Temp:  [98.7 F (37.1 C)-98.9 F (37.2 C)] 98.9 F (37.2 C) (02/18 0605) Pulse Rate:  [89-92] 90 (02/18 0605) Resp:  [20] 20 (02/18 0605) BP: (114-122)/(75-82) 122/82 (02/18 0605) SpO2:  [97 %-98 %] 98 % (02/18 0605) Last BM Date: 04/07/17   Intake/Output from previous day: 02/17 0701 - 02/18 0700 In: 4455 [P.O.:480; I.V.:2855; IV Piggyback:1100] Out: 70 [Urine:4; Drains:65; Stool:1] Intake/Output this shift: No intake/output data recorded.  General appearance: alert, cooperative and no distress Resp: clear to auscultation bilaterally GI: she says she has discomfort RUQ and mid epigastric area, but not all that tender on palpation.    Lab Results:  Recent Labs    04/06/17 0437  WBC 6.9  HGB 10.5*  HCT 32.1*  PLT 550*    BMET Recent Labs    04/06/17 0437  NA 141  K 4.2  CL 111  CO2 20*  GLUCOSE 107*  BUN <5*  CREATININE 0.67  CALCIUM 8.9   PT/INR No results for input(s): LABPROT, INR in the last 72 hours.  Recent Labs  Lab 04/02/17 1337 04/03/17 0021  AST 31 29  ALT 24 20  ALKPHOS 126 103  BILITOT 1.1 0.5  PROT 8.6* 7.0  ALBUMIN 3.0* 2.5*     Lipase     Component Value Date/Time   LIPASE 31 04/02/2017 1337     Medications: . acetaminophen  1,000 mg Oral TID  . aspirin EC  81 mg Oral Daily  . atorvastatin  40 mg Oral Daily  . enoxaparin (LOVENOX) injection  40 mg Subcutaneous Q24H  . insulin aspart  0-15 Units Subcutaneous TID AC & HS  . lactose free nutrition  237 mL Oral BID BM  . losartan  25 mg Oral Daily  . pantoprazole  40 mg Oral Daily  . sodium chloride flush  3 mL Intravenous Q12H  . sodium chloride flush  5 mL Intracatheter Q8H   Anti-infectives (From admission, onward)   Start     Dose/Rate Route Frequency Ordered Stop   04/04/17 0800  metroNIDAZOLE (FLAGYL) IVPB 500 mg     500 mg 100 mL/hr  over 60 Minutes Intravenous Every 8 hours 04/04/17 0735     04/02/17 2345  ciprofloxacin (CIPRO) IVPB 400 mg     400 mg 200 mL/hr over 60 Minutes Intravenous Every 12 hours 04/02/17 2338     04/02/17 1415  levofloxacin (LEVAQUIN) IVPB 750 mg     750 mg 100 mL/hr over 90 Minutes Intravenous  Once 04/02/17 1407 04/02/17 1550   04/02/17 1415  aztreonam (AZACTAM) 2 g in sodium chloride 0.9 % 100 mL IVPB     2 g 200 mL/hr over 30 Minutes Intravenous  Once 04/02/17 1407 04/02/17 1600   04/02/17 1415  vancomycin (VANCOCIN) IVPB 1000 mg/200 mL premix     1,000 mg 200 mL/hr over 60 Minutes Intravenous  Once 04/02/17 1407 04/02/17 1630       Assessment/Plan HTN/tachycardia - cozaar DM - SSI HLD - lipitor GERD - protonix  Acute on Chronic Calculus Cholecystitis S/p lap chole with IOC and ligation of biliary duct of Luschka 1/19 by Dr. Johney Maine S/p ERCP with CBD stent placement 1/24 for bile leak Readmitted 2/12 with n/v - CT showed complex fluid collections along gallbladder fossa 5.4 x 6.2 x 4.3 cm in aggregate - HIDA 2/13:  Resolution of bile  leak. Normal hepatobiliary scan post cholecystectomy.  ID - cipro 2/12 =>> day 6, flagyl day 4 FEN - Regular diet VTE - SCDs, lovenox Foley - none Follow up - Dr. Johney Maine  Plan:  Cont drains per IR.  Cont abx.  Advanced to regular diet. Possible D/C later today once cx's are back     LOS: 6 days    Marinell Igarashi C. 07/09/7469

## 2017-04-09 ENCOUNTER — Other Ambulatory Visit: Payer: Self-pay | Admitting: General Surgery

## 2017-04-09 ENCOUNTER — Telehealth: Payer: Self-pay | Admitting: Osteopathic Medicine

## 2017-04-09 DIAGNOSIS — K750 Abscess of liver: Secondary | ICD-10-CM

## 2017-04-09 LAB — AEROBIC/ANAEROBIC CULTURE W GRAM STAIN (SURGICAL/DEEP WOUND): Special Requests: NORMAL

## 2017-04-09 LAB — CULTURE, BLOOD (ROUTINE X 2)
Culture: NO GROWTH
Culture: NO GROWTH
Special Requests: ADEQUATE
Special Requests: ADEQUATE

## 2017-04-09 MED FILL — CIPROFLOXACIN HCL 500 MG TA: 500 | 7 days supply | Qty: 14 | Fill #0

## 2017-04-09 MED FILL — metroNIDAZOLE 500 MG TABS: 500 | 7 days supply | Qty: 21 | Fill #0

## 2017-04-09 NOTE — Telephone Encounter (Signed)
Routing to PCP for status update.

## 2017-04-09 NOTE — Telephone Encounter (Signed)
Dr A: Pt's husband, Colleene Swarthout called.  He is checking on Short Term Disability forms - there is a time constraint on this paperwork.  Sorry to have disturbed you Dr A.

## 2017-04-09 NOTE — Telephone Encounter (Signed)
Pt's husband advised.  

## 2017-04-09 NOTE — Telephone Encounter (Signed)
Will let patient know.  Thanks.

## 2017-04-09 NOTE — Telephone Encounter (Signed)
I'm pretty sure these are in my office, will work on these today

## 2017-04-09 NOTE — Telephone Encounter (Signed)
I finished forms - will be faxed anc copy up front for them to pick up

## 2017-04-09 NOTE — Discharge Summary (Signed)
Physician Discharge Summary  Patient ID: RUTHELLA KIRCHMAN MRN: [id number] DOB/AGE: [birth year] 59 y.o.  Admit date: 04/02/2017 Discharge date: 04/08/2017  Admission Diagnoses:  Acute on Chronic Calculus Cholecystitis S/p lap chole with IOCandligation of biliary duct of Luschka 1/19byDr. Gross S/p ERCP with CBD stent placement 1/24 for bile leak Readmitted 2/12 with n/v HTN/tachycardia  DM  HLD  GERD     Discharge Diagnoses:  New gallbladder fossa abscess. Acute on Chronic Calculus Cholecystitis S/p lap chole with IOCandligation of biliary duct of Luschka 1/19byDr. Gross S/p ERCP with CBD stent placement 1/24 for bile leak Readmitted 2/12 with n/v HTN/tachycardia - cozaar Type II DM  HLD  GERD        Principal Problem:   Hepatic abscesses s/p perc drainage 04/04/2017 Active Problems:   Essential hypertension   GERD   Constipation   Acute on chronic cholecystitis s/p lap cholecystectomy 03/09/2017   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 s/p ligation 03/09/2017   Delayed bile leak s/p ERCP/stenting 03/14/2017   Nausea & vomiting   PROCEDURES: IR drain placement 04/04/17 gallbladder fossa abscess  Hospital Course: Patient is 3 weeks out from an emergent laparoscopic cholecystectomy by Dr. Johney Maine for acute cholecystitis.  A JP drain was in place up until 1 February.  She was seen yesterday by Dr. gross and was doing okay except for some mild nausea.  She woke up this morning with more nausea and vomiting.  She also complains of constipation.  CT scan was done by the emergency room physician which shows a complex fluid collection in the gallbladder fossa.  She is also having fever 102 degrees.  Patient denies any cough or runny nose.  Patient denies any exposure to anyone with the flu or gastroenteritis.  Patient denies severe abdominal pain.  Patient's postoperative course was complicated with a duct of Luscka that was ligated intraoperatively.  She underwent 2  postoperative ERCPs 1 by Dr. Almyra Free the second by Dr. Fuller Plan.  The first ERCP was a simple sphincterotomy the second 1 was done to place a stent.  She was recovering well.    She returned with a complaints of nausea and vomiting.  Low-grade fevers had temperatures up to 102.  WBC was 11.2 and actually improved during her hospitalization.  CT scan showed a complex collection of fluid along the gallbladder fossa measuring 5.4 x 6.2 x 4.3 cm.  HIDA scan was negative for leak.  Patient was sent to IR and #10 French drains were placed in the fluid collections.  Patient was placed on Cipro initially and Flagyl was added with her elevated temps.  Cultures from the IR drain is noted below.  These were completed after the patient's discharge.  Patient was doing well on Cipro and Flagyl and was discharged home on that.  She also has a follow-up with the drain clinic and then follow-up with Dr. Johney Maine.  At this point she was afebrile pain issues are resolved.  It was Dr. Manon Hilding opinion she can be discharged home on oral antibiotics with follow-up as listed as below.  This  Condition on discharge: Improved   ABUNDANT STREPTOCOCCUS ANGINOSIS  ABUNDANT PREVOTELLA DENTICOLA  BETA LACTAMASE POSITIVE  Performed at [facility], [place] [address], [place], [place] [address]     Report Status 04/09/2017 FINAL   Organism ID, Bacteria STREPTOCOCCUS ANGINOSIS   Resulting Agency [facility]  Susceptibility    Streptococcus anginosis    MIC    ERYTHROMYCIN <=  0.12 SENS... Sensitive    LEVOFLOXACIN 1 SENSITIVE  Sensitive    VANCOMYCIN 0.5 SENSITIVE  Sensitive         Susceptibility Comments      CBC Latest Ref Rng & Units 04/06/2017 04/05/2017 04/04/2017  WBC 4.0 - 10.5 K/uL 6.9 8.4 9.6  Hemoglobin 12.0 - 15.0 g/dL 10.5(L) 9.0(L) 8.9(L)  Hematocrit 36.0 - 46.0 % 32.1(L) 26.7(L) 26.7(L)  Platelets 150 - 400 K/uL 550(H) 447(H) 424(H)   CMP Latest Ref Rng & Units 04/06/2017 04/05/2017 04/03/2017  Glucose 65 - 99  mg/dL 107(H) 134(H) 144(H)  BUN 6 - 20 mg/dL <5(L) <5(L) 10  Creatinine 0.44 - 1.00 mg/dL 0.67 0.68 0.72  Sodium 135 - 145 mmol/L 141 139 134(L)  Potassium 3.5 - 5.1 mmol/L 4.2 3.0(L) 4.0  Chloride 101 - 111 mmol/L 111 110 104  CO2 22 - 32 mmol/L 20(L) 20(L) 20(L)  Calcium 8.9 - 10.3 mg/dL 8.9 8.2(L) 8.5(L)  Total Protein 6.5 - 8.1 g/dL - - 7.0  Total Bilirubin 0.3 - 1.2 mg/dL - - 0.5  Alkaline Phos 38 - 126 U/L - - 103  AST 15 - 41 U/L - - 29  ALT 14 - 54 U/L - - 20      Disposition: 01-Home or Self Care   Allergies as of 04/08/2017      Reactions   Codeine    REACTION: headache, visual disturbance   Morphine And Related    Rocephin [ceftriaxone Sodium In Dextrose] Rash      Medication List    STOP taking these medications   sodium bicarbonate 325 MG tablet     TAKE these medications   acetaminophen 500 MG tablet Commonly known as:  TYLENOL Take 1000 mg of Tylenol every 6 hours as needed for pain.   DO NOT TAKE MORE THAN 4000 MG OF TYLENOL PER DAY.  IT CAN HARM YOUR LIVER.   aspirin 81 MG EC tablet Take 81 mg by mouth daily.   atorvastatin 40 MG tablet Commonly known as:  LIPITOR Take 1 tablet (40 mg total) by mouth daily.   BOOST PO Take 1 Can by mouth daily.   ciprofloxacin 500 MG tablet Commonly known as:  CIPRO Take 1 tablet (500 mg total) by mouth 2 (two) times daily.   docusate sodium 100 MG capsule Commonly known as:  COLACE Take 100 mg by mouth daily as needed for mild constipation.   fluticasone 50 MCG/ACT nasal spray Commonly known as:  FLONASE Place 1 spray into both nostrils daily as needed for allergies or rhinitis.   ibuprofen 200 MG tablet Commonly known as:  ADVIL,MOTRIN You can take 2-3 tablets every 6 hours as needed for pain.  You can alternate with plain Tylenol also. You can buy this at any drug store over the counter.   losartan 25 MG tablet Commonly known as:  COZAAR Take 1 tablet (25 mg total) daily by mouth.   metFORMIN  500 MG 24 hr tablet Commonly known as:  GLUCOPHAGE XR Take 1 tablet (500 mg total) by mouth daily with breakfast.   methylPREDNISolone 4 MG tablet Commonly known as:  MEDROL Take 4 mg by mouth. TAKE AS DIRECTED ON PACKAGE   metroNIDAZOLE 500 MG tablet Commonly known as:  FLAGYL Take 1 tablet (500 mg total) by mouth every 8 (eight) hours.   omeprazole 40 MG capsule Commonly known as:  PRILOSEC Take 1 capsule (40 mg total) by mouth daily.   oxyCODONE 5 MG immediate release  tablet Commonly known as:  Oxy IR/ROXICODONE Use for pain not relieved by plain Tylenol or Ibuprofen.  1 tablet every 6 hours as needed. What changed:    how much to take  how to take this  when to take this  reasons to take this  additional instructions   sodium chloride flush 0.9 % Soln Commonly known as:  NS Use to flush drain twice per day.   sucralfate 1 GM/10ML suspension Commonly known as:  CARAFATE Take 10 mLs (1 g total) 4 (four) times daily -  with meals and at bedtime by mouth.      Follow-up Information    Michael Boston, MD Follow up.   Specialty:  General Surgery Why:  Keep your appointment for 04/15/17 with Dr. Johney Maine. Record drainage from IR drain daily, color and volume.  Take to drain clinic and Dr. Johney Maine. Contact information: [address] [place] [address] [phone]           Signed: Earnstine Regal 04/09/2017, 3:52 PM

## 2017-04-10 NOTE — Telephone Encounter (Signed)
At provider's request paperwork was faxed to [facility] @ [phone]. Confirmation rec'd. Patient stopped by center to p/u paperwork, handed to pt by Sharyn Lull L.

## 2017-04-16 ENCOUNTER — Encounter: Payer: Self-pay | Admitting: Radiology

## 2017-04-16 ENCOUNTER — Ambulatory Visit
Admission: RE | Admit: 2017-04-16 | Discharge: 2017-04-16 | Disposition: A | Discharge status: Home / Self Care | Payer: 59 | Source: Ambulatory Visit | Attending: Radiology | Admitting: Radiology

## 2017-04-16 ENCOUNTER — Ambulatory Visit
Admission: RE | Admit: 2017-04-16 | Discharge: 2017-04-16 | Disposition: A | Discharge status: Home / Self Care | Payer: 59 | Source: Ambulatory Visit | Attending: General Surgery | Admitting: General Surgery

## 2017-04-16 ENCOUNTER — Other Ambulatory Visit: Payer: 59

## 2017-04-16 DIAGNOSIS — K750 Abscess of liver: Secondary | ICD-10-CM

## 2017-04-16 HISTORY — PX: IR RADIOLOGIST EVAL & MGMT: IMG5224

## 2017-04-16 MED ORDER — IOPAMIDOL (ISOVUE-300) INJECTION 61%
100.0000 mL | Freq: Once | INTRAVENOUS | Status: AC | PRN
  Administered 2017-04-16: 100 mL via INTRAVENOUS

## 2017-04-16 NOTE — Progress Notes (Signed)
Chief Complaint: Patient was seen in consultation today for  Chief Complaint  Patient presents with  . Follow-up    follow up abscess drain   at the request of Allred,Darrell K  Referring Physician(s): Allred,Darrell K  History of Present Illness: Whitney Neal is a 57 y.o. female who underwent lap chole on 03/09/2017. She developed post op bile leak and had ERCP with biliary stent placed on 1/24. She then developed hepatic abscesses and had to have perc drains X placed on 2/14. However, a HIDA scan at that time did show her bile leak to be resolved. She has done pretty well with the drains. Now having minimal output, no more than her daily 27mL flush. She has had no fevers, and her diet is going well. She is here today after having follow up CT scan.   Past Medical History:  Diagnosis Date  . Atypical chest pain   . Barrett's esophagus    history of.  Normal EGD 2011, 2014  . Cervical lymphadenopathy    left  . Cervical strain   . Constipation   . GERD (gastroesophageal reflux disease)   . Hypercholesteremia   . Hyperglycemia    a1c 6.3 7/12  . Hyperplastic colonic polyp    history of   . Hypertension   . Migraine   . Osteopenia   . PONV (postoperative nausea and vomiting)   . Ulcer 2010  . Urinary incontinence   . Vitamin D deficiency     Past Surgical History:  Procedure Laterality Date  . ABDOMINAL HYSTERECTOMY  2009   Dr. Bethann Goo - benign disease  . BLADDER SURGERY     bladder mesh surgery 2010- Dr. Rhodia Albright  . BREAST CYST ASPIRATION    . COLONOSCOPY W/ POLYPECTOMY  2010  . ERCP N/A 03/13/2017   Procedure: ENDOSCOPIC RETROGRADE CHOLANGIOPANCREATOGRAPHY (ERCP);  Surgeon: Carol Ada, MD;  Location: [facility];  Service: Endoscopy;  Laterality: N/A;  . ERCP N/A 03/14/2017   Procedure: ENDOSCOPIC RETROGRADE CHOLANGIOPANCREATOGRAPHY (ERCP);  Surgeon: Ladene Artist, MD;  Location: [facility];  Service: Endoscopy;  Laterality: N/A;  . LAPAROSCOPIC  CHOLECYSTECTOMY SINGLE PORT N/A 03/09/2017   Procedure: LAPAROSCOPIC CHOLECYSTECTOMY WITH INTRAOPERATIVE CHOLANGIOGRAM, CLOSURE OF ABERRANT DUCT OF LUSCHKA;  Surgeon: Michael Boston, MD;  Location: [facility];  Service: General;  Laterality: N/A;    Allergies: Codeine; Morphine and related; and Rocephin [ceftriaxone sodium in dextrose]  Medications: Prior to Admission medications   Medication Sig Start Date End Date Taking? Authorizing Provider  acetaminophen (TYLENOL) 500 MG tablet Take 1000 mg of Tylenol every 6 hours as needed for pain.   DO NOT TAKE MORE THAN 4000 MG OF TYLENOL PER DAY.  IT CAN HARM YOUR LIVER. 04/08/17  Yes Earnstine Regal, PA-C  aspirin 81 MG EC tablet Take 81 mg by mouth daily.     Yes [provider]  atorvastatin (LIPITOR) 40 MG tablet Take 1 tablet (40 mg total) by mouth daily. 02/08/16  Yes Emeterio Reeve, DO  ciprofloxacin (CIPRO) 500 MG tablet Take 1 tablet (500 mg total) by mouth 2 (two) times daily. Patient not taking: Reported on 04/16/2017 04/08/17   Earnstine Regal, PA-C  docusate sodium (COLACE) 100 MG capsule Take 100 mg by mouth daily as needed for mild constipation.    [provider]  fluticasone (FLONASE) 50 MCG/ACT nasal spray Place 1 spray into both nostrils daily as needed for allergies or rhinitis.    [provider]  ibuprofen (ADVIL,MOTRIN) 200 MG  tablet You can take 2-3 tablets every 6 hours as needed for pain.  You can alternate with plain Tylenol also. You can buy this at any drug store over the counter. 04/08/17   Earnstine Regal, PA-C  losartan (COZAAR) 25 MG tablet Take 1 tablet (25 mg total) daily by mouth. 12/27/16   Emeterio Reeve, DO  metFORMIN (GLUCOPHAGE XR) 500 MG 24 hr tablet Take 1 tablet (500 mg total) by mouth daily with breakfast. 08/23/16 08/23/17  Emeterio Reeve, DO  methylPREDNISolone (MEDROL) 4 MG tablet Take 4 mg by mouth. TAKE AS DIRECTED ON PACKAGE 03/28/17   [provider]    metroNIDAZOLE (FLAGYL) 500 MG tablet Take 1 tablet (500 mg total) by mouth every 8 (eight) hours. 04/08/17   Earnstine Regal, PA-C  Nutritional Supplements (BOOST PO) Take 1 Can by mouth daily.    [provider]  omeprazole (PRILOSEC) 40 MG capsule Take 1 capsule (40 mg total) by mouth daily. 08/27/16   Emeterio Reeve, DO  oxyCODONE (OXY IR/ROXICODONE) 5 MG immediate release tablet Use for pain not relieved by plain Tylenol or Ibuprofen.  1 tablet every 6 hours as needed. 04/08/17   Earnstine Regal, PA-C  sodium chloride flush (NS) 0.9 % SOLN Use to flush drain twice per day. 04/08/17   Earnstine Regal, PA-C  sucralfate (CARAFATE) 1 GM/10ML suspension Take 10 mLs (1 g total) 4 (four) times daily -  with meals and at bedtime by mouth. Patient not taking: Reported on 04/02/2017 12/28/16   Emeterio Reeve, DO     Family History  Problem Relation Age of Onset  . Colon cancer Father   . Hypertension Father   . Heart attack Father 26  . Cancer Father        prostate CA  . Pulmonary embolism Mother        deceased  . Hypertension Mother   . Diabetes Sister   . Hypertension Brother   . Hypertension Sister   . Cancer Paternal Grandmother        oral  . Thyroid disease Sister        1/2 sister thyroid problem  . Healthy Daughter   . Heart attack Brother 64  . Breast cancer Cousin     Social History   Socioeconomic History  . Marital status: Married    Spouse name: Not on file  . Number of children: 2  . Years of education: Not on file  . Highest education level: Not on file  Social Needs  . Financial resource strain: Not on file  . Food insecurity - worry: Not on file  . Food insecurity - inability: Not on file  . Transportation needs - medical: Not on file  . Transportation needs - non-medical: Not on file  Occupational History  . Occupation: Chartered certified accountant    Employer: [employer]  Tobacco Use  . Smoking status: Never Smoker  . Smokeless tobacco: Never Used   Substance and Sexual Activity  . Alcohol use: No  . Drug use: No  . Sexual activity: Not on file  Other Topics Concern  . Not on file  Social History Narrative   Reviewed history from 07/04/2009 and no changes required.   Married- is guardian for two grandchildren ages 64 and 25.   Never Smoked   Alcohol use-no   Regular exercise-no   Works in housekeeping at [employer]: A 12 point ROS discussed and pertinent  positives are indicated in the HPI above.  All other systems are negative.  Review of Systems  Vital Signs: BP 125/84   Pulse (!) 106   Temp 99.4 F (37.4 C) (Oral)   Resp 14   SpO2 97%   Physical Exam  Constitutional: She is oriented to person, place, and time. She appears well-developed. No distress.  Cardiovascular: Normal rate, regular rhythm and normal heart sounds.  Pulmonary/Chest: Effort normal and breath sounds normal. No respiratory distress.  Abdominal: Soft. She exhibits no distension. There is no tenderness.  RUQ drains intact. Sites very clean. Scant serous output in each bulb.  Neurological: She is alert and oriented to person, place, and time.  Skin: Skin is warm and dry.   Imaging: Dg Chest 2 View  Result Date: 04/02/2017 CLINICAL DATA:  Fever.  Recent drain removal. EXAM: CHEST  2 VIEW COMPARISON:  One-view chest x-ray 03/14/2017 FINDINGS: Heart size is normal. Lung volumes are low. Bibasilar airspace disease likely reflects atelectasis. The visualized soft tissues and bony thorax are unremarkable. IMPRESSION: Low lung volumes with bibasilar airspace disease, likely reflecting atelectasis. Electronically Signed   By: San Morelle M.D.   On: 04/02/2017 15:30   Nm Hepatobiliary Liver Func  Result Date: 04/03/2017 CLINICAL DATA:  History of cholecystectomy and bile leak. EXAM: NUCLEAR MEDICINE HEPATOBILIARY IMAGING TECHNIQUE: Sequential images of the abdomen were obtained out to 60 minutes following  intravenous administration of radiopharmaceutical. RADIOPHARMACEUTICALS:  5.3 mCi Tc-74m  Choletec IV COMPARISON:  03/12/2017 FINDINGS: Prompt and symmetric uptake in the liver and normal excretion into the biliary tree. The gallbladder is surgically absent. Activity is seen in the small bowel by 15 minutes. No leak is identified. IMPRESSION: Resolution of bile leak. Normal hepatobiliary scan post cholecystectomy. Electronically Signed   By: Marijo Sanes M.D.   On: 04/03/2017 12:41   Ct Abdomen Pelvis W Contrast  Result Date: 04/16/2017 CLINICAL DATA:  Complex hepatic abscesses about gallbladder fossa post cholecystectomy, status post percutaneous drain catheter placement x2 04/04/2017. Low output from both drains. EXAM: CT ABDOMEN AND PELVIS WITH CONTRAST TECHNIQUE: Multidetector CT imaging of the abdomen and pelvis was performed using the standard protocol following bolus administration of intravenous contrast. CONTRAST:  146mL ISOVUE-300 IOPAMIDOL (ISOVUE-300) INJECTION 61% COMPARISON:  04/04/2017 and previous FINDINGS: Lower chest: Subsegmental atelectasis in the lung bases right greater than left. No pleural or pericardial effusion. Hepatobiliary: Percutaneous drain catheters are stable in position. There has been resolution of the fluid collections since 04/02/2017. No new liver lesion. No biliary ductal dilatation. Cholecystectomy clips. Pancreas: Unremarkable. No pancreatic ductal dilatation or surrounding inflammatory changes. Spleen: Normal in size without focal abnormality. Adrenals/Urinary Tract: Normal adrenals. Kidneys unremarkable. Urinary bladder is decompressed, containing a few gas bubbles. Stomach/Bowel: Stomach is nondistended. Wall thickening is suspected in the antrum which may be mimicked by under distention. Small diverticulum from the proximal duodenum. Remainder of small bowel is decompressed. Normal appendix. The colon is nondilated, unremarkable. Vascular/Lymphatic: No significant  vascular findings are present. No enlarged abdominal or pelvic lymph nodes. If Reproductive: Status post hysterectomy. No adnexal masses. Other: No ascites.  No free air. Musculoskeletal: Metallic clips in the deep subcutaneous fat anterior to the urinary bladder. No fracture or worrisome bone abnormality. IMPRESSION: 1. Interval resolution of complex hepatic abscess post drain catheter placement x2. 2. No new collection or other acute finding. Electronically Signed   By: Lucrezia Europe M.D.   On: 04/16/2017 13:35   Ct Abdomen Pelvis W Contrast  Result  Date: 04/02/2017 CLINICAL DATA:  Status post cholecystectomy on 03/09/2017, with nausea/vomiting and fever status post JP drain removal on 03/22/2017. EXAM: CT ABDOMEN AND PELVIS WITH CONTRAST TECHNIQUE: Multidetector CT imaging of the abdomen and pelvis was performed using the standard protocol following bolus administration of intravenous contrast. CONTRAST:  100 mL Isovue 300 IV COMPARISON:  None. FINDINGS: Lower chest: Mild patchy opacities in the right lower lobe (series 7/image 19), likely atelectasis, pneumonia not excluded. Hepatobiliary: No focal hepatic lesions. Post cholecystectomy. Complex fluid collection along the inferior aspect of the liver/gallbladder fossa, with 3 distinct components, measuring 5.4 x 6.2 x 4.3 cm in aggregate. Dominant component along the posterior gallbladder fossa measures 2.8 cm (series 2/image 22). Dominant component along the anterior/lateral gallbladder fossa measures 2.7 cm (series 2/image 25). Dominant component medially in the gallbladder fossa extends along the wall of the gastric antrum, where it measures 3.8 cm (series 2/image 27). No intrahepatic or extrahepatic ductal dilatation. Pancreas: Within normal limits. Spleen: Within normal limits. Adrenals/Urinary Tract: Adrenal glands are within normal limits. Kidneys are within normal limits.  No hydronephrosis. Bladder is within normal limits, noting a tiny focus of  nondependent gas. Stomach/Bowel: Stomach is within normal limits. Moderate duodenal diverticulum. No evidence of bowel obstruction. Normal appendix (series 2/image 65). Vascular/Lymphatic: No evidence of abdominal aortic aneurysm. No suspicious abdominopelvic lymphadenopathy. Reproductive: Status post hysterectomy. No adnexal masses. Other: No abdominopelvic ascites. Mild postsurgical changes along the anterior abdominal wall. Musculoskeletal: Visualized osseous structures are within normal limits. IMPRESSION: Status post cholecystectomy. Complex fluid collections along the gallbladder fossa, as described above, measuring 5.4 x 6.2 x 4.3 cm in aggregate. These collections extend along the inferior aspect of the liver and into the wall of the gastric antrum. Presumably, this appearance is related to the patient's history of bile leak, although superimposed infection/abscess is not excluded. Mild patchy right lower lobe opacities, likely atelectasis, pneumonia not excluded. Electronically Signed   By: Julian Hy M.D.   On: 04/02/2017 15:54   Ct Image Guided Drainage By Percutaneous Catheter  Result Date: 04/04/2017 INDICATION: History of cholecystectomy complicated by development of a multiloculated abscesses about the gallbladder fossa. Request made for placement of image guided percutaneous drainage catheter(s) for infection source control purposes. EXAM: ULTRASOUND AND CT-GUIDED HEPATIC ABSCESS DRAINAGE CATHETER PLACEMENT X2. COMPARISON:  CT abdomen and pelvis - 04/02/2017 MEDICATIONS: The patient is currently admitted to the hospital and receiving intravenous antibiotics. The antibiotics were administered within an appropriate time frame prior to the initiation of the procedure. ANESTHESIA/SEDATION: Moderate (conscious) sedation was employed during this procedure. A total of Versed 2 mg and Fentanyl 100 mcg was administered intravenously. Moderate Sedation Time: 30 minutes. The patient's level of  consciousness and vital signs were monitored continuously by radiology nursing throughout the procedure under my direct supervision. CONTRAST:  None COMPLICATIONS: None immediate. PROCEDURE: Informed written consent was obtained from the patient after a discussion of the risks, benefits and alternatives to treatment. The patient was placed supine, slightly LPO on the CT gantry and a pre procedural CT was performed re-demonstrating the known abscess/fluid collection within the hepatic parenchyma adjacent to the gallbladder fossa with dominant ill-defined hypoattenuating component measuring approximately 3.1 x 2.5 cm (image 27, series 2) and additional dominant subcapsular component measuring approximately 3.3 x 1.9 cm (image 33, series 2). The procedure was planned. A timeout was performed prior to the initiation of the procedure. The skin overlying the right upper abdomen was prepped and draped in the usual sterile fashion.  The overlying soft tissues were anesthetized with 1% lidocaine with epinephrine. Under direct ultrasound guidance, the dominant component within the more central aspect of the right lobe of the liver was accessed with an 18 gauge trocar needle. An ultrasound image was saved for procedural documentation purposes. A short Amplatz wire was coiled within the collection. Appropriate position was confirmed with a limited CT scan. Next, the track was serially dilated allowing placement of a 10 [place] all-purpose drainage catheter. Postprocedural imaging demonstrates appropriate positioning of the percutaneous drainage catheter. Approximately 30 cc of purulent fluid was aspirated. Following evacuation of nearly all purulent material from the dominant component of the hepatic abscess, sonographic evaluation demonstrated persistent abscess about the subcapsular aspect the right lobe of the liver the decision was made to place an additional percutaneous drainage catheter. As such, under direct ultrasound  guidance, the dominant component of the subcapsular collection was accessed with an 18 gauge trocar needle. An ultrasound image was saved for procedural documentation purposes. A short Amplatz wire was coiled within the collection. Appropriate position was confirmed with a limited CT scan. Next, the track was dilated allowing placement of a 10 [place] all-purpose drainage catheter. Postprocedural imaging demonstrates appropriate positioning of the percutaneous drainage catheter. Approximately 10 cc of purulent fluid was aspirated. A sample of aspirated purulent debris was capped and sent to the laboratory for analysis. Both hepatic abscess drainage catheters were connected to JP bulbs and sutured in place. Dressings were placed. The patient tolerated the above procedures well without immediate post procedural complication. IMPRESSION: Successful ultrasound and CT-guided placement of two 10 [place] all-purpose drainage catheters into the dominant components of the multiloculated complex hepatic abscesses about the gallbladder fossa. Samples were sent to the laboratory as requested by the ordering clinical team. Electronically Signed   By: Sandi Mariscal M.D.   On: 04/04/2017 11:34   Ct Image Guided Drainage Percut Cath  Peritoneal Retroperit  Result Date: 04/04/2017 INDICATION: History of cholecystectomy complicated by development of a multiloculated abscesses about the gallbladder fossa. Request made for placement of image guided percutaneous drainage catheter(s) for infection source control purposes. EXAM: ULTRASOUND AND CT-GUIDED HEPATIC ABSCESS DRAINAGE CATHETER PLACEMENT X2. COMPARISON:  CT abdomen and pelvis - 04/02/2017 MEDICATIONS: The patient is currently admitted to the hospital and receiving intravenous antibiotics. The antibiotics were administered within an appropriate time frame prior to the initiation of the procedure. ANESTHESIA/SEDATION: Moderate (conscious) sedation was employed during this procedure.  A total of Versed 2 mg and Fentanyl 100 mcg was administered intravenously. Moderate Sedation Time: 30 minutes. The patient's level of consciousness and vital signs were monitored continuously by radiology nursing throughout the procedure under my direct supervision. CONTRAST:  None COMPLICATIONS: None immediate. PROCEDURE: Informed written consent was obtained from the patient after a discussion of the risks, benefits and alternatives to treatment. The patient was placed supine, slightly LPO on the CT gantry and a pre procedural CT was performed re-demonstrating the known abscess/fluid collection within the hepatic parenchyma adjacent to the gallbladder fossa with dominant ill-defined hypoattenuating component measuring approximately 3.1 x 2.5 cm (image 27, series 2) and additional dominant subcapsular component measuring approximately 3.3 x 1.9 cm (image 33, series 2). The procedure was planned. A timeout was performed prior to the initiation of the procedure. The skin overlying the right upper abdomen was prepped and draped in the usual sterile fashion. The overlying soft tissues were anesthetized with 1% lidocaine with epinephrine. Under direct ultrasound guidance, the dominant component within the more central  aspect of the right lobe of the liver was accessed with an 18 gauge trocar needle. An ultrasound image was saved for procedural documentation purposes. A short Amplatz wire was coiled within the collection. Appropriate position was confirmed with a limited CT scan. Next, the track was serially dilated allowing placement of a 10 [place] all-purpose drainage catheter. Postprocedural imaging demonstrates appropriate positioning of the percutaneous drainage catheter. Approximately 30 cc of purulent fluid was aspirated. Following evacuation of nearly all purulent material from the dominant component of the hepatic abscess, sonographic evaluation demonstrated persistent abscess about the subcapsular aspect the  right lobe of the liver the decision was made to place an additional percutaneous drainage catheter. As such, under direct ultrasound guidance, the dominant component of the subcapsular collection was accessed with an 18 gauge trocar needle. An ultrasound image was saved for procedural documentation purposes. A short Amplatz wire was coiled within the collection. Appropriate position was confirmed with a limited CT scan. Next, the track was dilated allowing placement of a 10 [place] all-purpose drainage catheter. Postprocedural imaging demonstrates appropriate positioning of the percutaneous drainage catheter. Approximately 10 cc of purulent fluid was aspirated. A sample of aspirated purulent debris was capped and sent to the laboratory for analysis. Both hepatic abscess drainage catheters were connected to JP bulbs and sutured in place. Dressings were placed. The patient tolerated the above procedures well without immediate post procedural complication. IMPRESSION: Successful ultrasound and CT-guided placement of two 10 French all-purpose drainage catheters into the dominant components of the multiloculated complex hepatic abscesses about the gallbladder fossa. Samples were sent to the laboratory as requested by the ordering clinical team. Electronically Signed   By: Sandi Mariscal M.D.   On: 04/04/2017 11:34    Labs:  CBC: Recent Labs    04/03/17 0021 04/04/17 0409 04/05/17 0509 04/06/17 0437  WBC 9.0 9.6 8.4 6.9  HGB 9.5* 8.9* 9.0* 10.5*  HCT 28.4* 26.7* 26.7* 32.1*  PLT 472* 424* 447* 550*    COAGS: Recent Labs    04/04/17 0409  INR 1.24    BMP: Recent Labs    04/02/17 1337 04/03/17 0021 04/05/17 0509 04/06/17 0437  NA 135 134* 139 141  K 3.9 4.0 3.0* 4.2  CL 100* 104 110 111  CO2 20* 20* 20* 20*  GLUCOSE 123* 144* 134* 107*  BUN 15 10 <5* <5*  CALCIUM 9.4 8.5* 8.2* 8.9  CREATININE 0.71 0.72 0.68 0.67  GFRNONAA >60 >60 >60 >60  GFRAA >60 >60 >60 >60    LIVER FUNCTION  TESTS: Recent Labs    03/11/17 0434 03/12/17 0534 04/02/17 1337 04/03/17 0021  BILITOT 1.0 0.8 1.1 0.5  AST 77* 41 31 29  ALT 143* 96* 24 20  ALKPHOS 104 95 126 103  PROT 6.9 6.6 8.6* 7.0  ALBUMIN 3.7 3.6 3.0* 2.5*    TUMOR MARKERS: No results for input(s): AFPTM, CEA, CA199, CHROMGRNA in the last 8760 hours.  Assessment and Plan: Hepatic abscesses s/p perc drains X 2 Follow up CT today reviewed with dr. Vernard Gambles. Resolution of the abscess cavities. Given lack of output and no leak on most recent HIDA scan. No need for drain injections. Drains removed at bedside without difficulty. Pt will follow up with surgical team as scheduled.  Thank you for this interesting consult.  I greatly enjoyed meeting Whitney Neal and look forward to participating in their care.  A copy of this report was sent to the requesting provider on this date.  Electronically  SignedAscencion Dike 04/16/2017, 1:43 PM   I spent a total of 15 minutes in face to face in clinical consultation, greater than 50% of which was counseling/coordinating care for hepatic abscess drains

## 2017-04-19 DIAGNOSIS — Z760 Encounter for issue of repeat prescription: Secondary | ICD-10-CM | POA: Diagnosis not present

## 2017-04-24 ENCOUNTER — Encounter (HOSPITAL_COMMUNITY): Payer: Self-pay | Admitting: Emergency Medicine

## 2017-04-24 ENCOUNTER — Other Ambulatory Visit: Payer: Self-pay

## 2017-04-24 ENCOUNTER — Other Ambulatory Visit: Payer: Self-pay | Admitting: Osteopathic Medicine

## 2017-04-24 MED FILL — OMEPRAZOLE DR 40 MG CAPSULE: 40 | 30 days supply | Qty: 30 | Fill #0

## 2017-04-24 MED FILL — LOSARTAN POTASSIUM 25 MG TA: 25 | 90 days supply | Qty: 90 | Fill #1

## 2017-05-02 NOTE — Anesthesia Preprocedure Evaluation (Addendum)
Anesthesia Evaluation  Patient identified by MRN, date of birth, ID band Patient awake    Reviewed: Allergy & Precautions, NPO status , Patient's Chart, lab work & pertinent test results  History of Anesthesia Complications (+) PONV  Airway Mallampati: II  TM Distance: >3 FB Neck ROM: Full    Dental  (+) Dental Advisory Given, Chipped, Missing   Pulmonary neg pulmonary ROS,    Pulmonary exam normal breath sounds clear to auscultation       Cardiovascular hypertension, Pt. on medications Normal cardiovascular exam Rhythm:Regular Rate:Normal     Neuro/Psych  Headaches,  Neuromuscular disease negative psych ROS   GI/Hepatic Neg liver ROS, GERD  Medicated,Barrett's esophagus   Endo/Other  diabetes, Type 2, Oral Hypoglycemic AgentsObesity  Renal/GU negative Renal ROS   Urinary incontinence    Musculoskeletal negative musculoskeletal ROS (+)   Abdominal   Peds  Hematology negative hematology ROS (+)   Anesthesia Other Findings   Reproductive/Obstetrics negative OB ROS                             Lab Results  Component Value Date   WBC 6.9 04/06/2017   HGB 10.5 (L) 04/06/2017   HCT 32.1 (L) 04/06/2017   MCV 83.6 04/06/2017   PLT 550 (H) 04/06/2017   Lab Results  Component Value Date   CREATININE 0.67 04/06/2017   BUN <5 (L) 04/06/2017   NA 141 04/06/2017   K 4.2 04/06/2017   CL 111 04/06/2017   CO2 20 (L) 04/06/2017     Anesthesia Physical Anesthesia Plan  ASA: II  Anesthesia Plan: General   Post-op Pain Management:    Induction:   PONV Risk Score and Plan: Treatment may vary due to age or medical condition, Ondansetron, Dexamethasone and Midazolam  Airway Management Planned: Oral ETT  Additional Equipment:   Intra-op Plan:   Post-operative Plan: Extubation in OR  Informed Consent: I have reviewed the patients History and Physical, chart, labs and discussed the  procedure including the risks, benefits and alternatives for the proposed anesthesia with the patient or authorized representative who has indicated his/her understanding and acceptance.   Dental advisory given  Plan Discussed with: CRNA  Anesthesia Plan Comments:         Anesthesia Quick Evaluation

## 2017-05-03 ENCOUNTER — Ambulatory Visit (HOSPITAL_COMMUNITY)
Admission: RE | Admit: 2017-05-03 | Discharge: 2017-05-03 | Disposition: A | Discharge status: Home / Self Care | Payer: 59 | Source: Ambulatory Visit | Attending: Gastroenterology | Admitting: Gastroenterology

## 2017-05-03 ENCOUNTER — Encounter (HOSPITAL_COMMUNITY)
Admission: RE | Disposition: A | Discharge status: Home / Self Care | Payer: Self-pay | Source: Ambulatory Visit | Attending: Gastroenterology

## 2017-05-03 ENCOUNTER — Encounter (HOSPITAL_COMMUNITY): Payer: Self-pay | Admitting: Anesthesiology

## 2017-05-03 ENCOUNTER — Ambulatory Visit (HOSPITAL_COMMUNITY): Payer: 59 | Admitting: Anesthesiology

## 2017-05-03 ENCOUNTER — Ambulatory Visit (HOSPITAL_COMMUNITY): Payer: 59

## 2017-05-03 DIAGNOSIS — Z7982 Long term (current) use of aspirin: Secondary | ICD-10-CM | POA: Insufficient documentation

## 2017-05-03 DIAGNOSIS — K219 Gastro-esophageal reflux disease without esophagitis: Secondary | ICD-10-CM | POA: Insufficient documentation

## 2017-05-03 DIAGNOSIS — E7800 Pure hypercholesterolemia, unspecified: Secondary | ICD-10-CM | POA: Insufficient documentation

## 2017-05-03 DIAGNOSIS — K832 Perforation of bile duct: Secondary | ICD-10-CM | POA: Diagnosis not present

## 2017-05-03 DIAGNOSIS — Z791 Long term (current) use of non-steroidal anti-inflammatories (NSAID): Secondary | ICD-10-CM | POA: Diagnosis not present

## 2017-05-03 DIAGNOSIS — Z79899 Other long term (current) drug therapy: Secondary | ICD-10-CM | POA: Insufficient documentation

## 2017-05-03 DIAGNOSIS — Z7984 Long term (current) use of oral hypoglycemic drugs: Secondary | ICD-10-CM | POA: Insufficient documentation

## 2017-05-03 DIAGNOSIS — I10 Essential (primary) hypertension: Secondary | ICD-10-CM | POA: Insufficient documentation

## 2017-05-03 DIAGNOSIS — Z7951 Long term (current) use of inhaled steroids: Secondary | ICD-10-CM | POA: Diagnosis not present

## 2017-05-03 DIAGNOSIS — Z4659 Encounter for fitting and adjustment of other gastrointestinal appliance and device: Secondary | ICD-10-CM | POA: Diagnosis not present

## 2017-05-03 DIAGNOSIS — Z4689 Encounter for fitting and adjustment of other specified devices: Secondary | ICD-10-CM

## 2017-05-03 DIAGNOSIS — Z9689 Presence of other specified functional implants: Secondary | ICD-10-CM | POA: Diagnosis not present

## 2017-05-03 DIAGNOSIS — R932 Abnormal findings on diagnostic imaging of liver and biliary tract: Secondary | ICD-10-CM | POA: Diagnosis not present

## 2017-05-03 DIAGNOSIS — E119 Type 2 diabetes mellitus without complications: Secondary | ICD-10-CM | POA: Diagnosis not present

## 2017-05-03 HISTORY — PX: ENDOSCOPIC RETROGRADE CHOLANGIOPANCREATOGRAPHY (ERCP) WITH PROPOFOL: SHX5810

## 2017-05-03 HISTORY — DX: Type 2 diabetes mellitus without complications: E11.9

## 2017-05-03 LAB — GLUCOSE, CAPILLARY: Glucose-Capillary: 97 mg/dL (ref 65–99)

## 2017-05-03 SURGERY — ENDOSCOPIC RETROGRADE CHOLANGIOPANCREATOGRAPHY (ERCP) WITH PROPOFOL
Anesthesia: General

## 2017-05-03 MED ORDER — LACTATED RINGERS IV SOLN
INTRAVENOUS | Status: DC
  Administered 2017-05-03: 1000 mL via INTRAVENOUS

## 2017-05-03 MED ORDER — MIDAZOLAM HCL 2 MG/2ML IJ SOLN
INTRAMUSCULAR | Status: AC
  Filled 2017-05-03: qty 2

## 2017-05-03 MED ORDER — MIDAZOLAM HCL 5 MG/5ML IJ SOLN
INTRAMUSCULAR | Status: DC | PRN
  Administered 2017-05-03: 2 mg via INTRAVENOUS

## 2017-05-03 MED ORDER — ONDANSETRON HCL 4 MG/2ML IJ SOLN
INTRAMUSCULAR | Status: DC | PRN
  Administered 2017-05-03: 4 mg via INTRAVENOUS

## 2017-05-03 MED ORDER — PROPOFOL 10 MG/ML IV BOLUS
INTRAVENOUS | Status: AC
Start: 2017-05-03 — End: 2017-05-03
  Filled 2017-05-03: qty 20

## 2017-05-03 MED ORDER — DEXAMETHASONE SODIUM PHOSPHATE 10 MG/ML IJ SOLN
INTRAMUSCULAR | Status: DC | PRN
  Administered 2017-05-03: 10 mg via INTRAVENOUS

## 2017-05-03 MED ORDER — FENTANYL CITRATE (PF) 100 MCG/2ML IJ SOLN
INTRAMUSCULAR | Status: AC
  Filled 2017-05-03: qty 2

## 2017-05-03 MED ORDER — PROPOFOL 500 MG/50ML IV EMUL
INTRAVENOUS | Status: DC | PRN
  Administered 2017-05-03: 160 mg via INTRAVENOUS

## 2017-05-03 MED ORDER — LIDOCAINE HCL (CARDIAC) 20 MG/ML IV SOLN
INTRAVENOUS | Status: DC | PRN
Start: 2017-05-03 — End: 2017-05-03
  Administered 2017-05-03: 100 mg via INTRATRACHEAL

## 2017-05-03 MED ORDER — SODIUM CHLORIDE 0.9 % IV SOLN: INTRAVENOUS | Status: DC

## 2017-05-03 MED ORDER — SODIUM CHLORIDE 0.9 % IV SOLN
INTRAVENOUS | Status: DC | PRN
  Administered 2017-05-03: 30 mL

## 2017-05-03 MED ORDER — PROPOFOL 10 MG/ML IV BOLUS
INTRAVENOUS | Status: AC
  Filled 2017-05-03: qty 20

## 2017-05-03 MED ORDER — FENTANYL CITRATE (PF) 100 MCG/2ML IJ SOLN
INTRAMUSCULAR | Status: DC | PRN
  Administered 2017-05-03: 100 ug via INTRAVENOUS

## 2017-05-03 MED ORDER — CIPROFLOXACIN IN D5W 400 MG/200ML IV SOLN
INTRAVENOUS | Status: DC | PRN
  Administered 2017-05-03: 400 mg via INTRAVENOUS

## 2017-05-03 MED ORDER — CIPROFLOXACIN IN D5W 400 MG/200ML IV SOLN
INTRAVENOUS | Status: AC
  Filled 2017-05-03: qty 200

## 2017-05-03 MED ORDER — LACTATED RINGERS IV SOLN
INTRAVENOUS | Status: DC | PRN
  Administered 2017-05-03: 07:00:00 via INTRAVENOUS

## 2017-05-03 NOTE — Transfer of Care (Signed)
Immediate Anesthesia Transfer of Care Note  Patient: Whitney Neal  Procedure(s) Performed: ENDOSCOPIC RETROGRADE CHOLANGIOPANCREATOGRAPHY (ERCP) WITH PROPOFOL (N/A )  Patient Location: PACU  Anesthesia Type:General  Level of Consciousness: awake, alert  and oriented  Airway & Oxygen Therapy: Patient Spontanous Breathing  Post-op Assessment: Report given to RN and Post -op Vital signs reviewed and stable  Post vital signs: Reviewed and stable  Last Vitals:  Vitals:   05/03/17 0737  BP: 134/81  Pulse: 91  Resp: 17  Temp: 36.7 C  SpO2: 100%    Last Pain:  Vitals:   05/03/17 0737  TempSrc: Oral         Complications: No apparent anesthesia complications

## 2017-05-03 NOTE — Op Note (Signed)
[name] Patient Name: Whitney Neal Procedure Date: 05/03/2017 MRN: [id number] Attending MD: Carol Ada , MD Date of Birth: 10/27/1960 CSN: [id number] Age: 57 Admit Type: Outpatient Procedure:                ERCP Indications:              Biliary stent removal Providers:                Carol Ada, MD, Cleda Daub, RN, Charolette Child,                            Technician Referring MD:              Medicines:                General Anesthesia Complications:            No immediate complications. Estimated Blood Loss:     Estimated blood loss: none. Procedure:                Pre-Anesthesia Assessment:                           - Prior to the procedure, a History and Physical                            was performed, and patient medications and                            allergies were reviewed. The patient's tolerance of                            previous anesthesia was also reviewed. The risks                            and benefits of the procedure and the sedation                            options and risks were discussed with the patient.                            All questions were answered, and informed consent                            was obtained. Prior Anticoagulants: The patient has                            taken no previous anticoagulant or antiplatelet                            agents. ASA Grade Assessment: II - A patient with                            mild systemic disease. After reviewing the risks                            and benefits, the  patient was deemed in                            satisfactory condition to undergo the procedure.                           - Sedation was administered by an anesthesia                            professional. General anesthesia was attained.                           After obtaining informed consent, the scope was                            passed under direct vision. Throughout the               procedure, the patient's blood pressure, pulse, and                            oxygen saturations were monitored continuously. The                            Duodenoscope was introduced through the mouth, and                            used to inject contrast into and used to inject                            contrast into the bile duct. The ERCP was                            accomplished without difficulty. The patient                            tolerated the procedure well. Scope In: Scope Out: Findings:      The scout film was normal. The major papilla was normal. The bile duct       was deeply cannulated with the short-nosed traction sphincterotome.       Contrast was injected. I personally interpreted the bile duct images.       There was brisk flow of contrast through the ducts. Image quality was       excellent. Contrast extended to the hepatic ducts. The main bile duct       was normal.      Initial inspection of the ampulla was negative for any overt evidence of       a biliary stent. The scout film did not reveal any proximal stent       migration. Cannulation of the CBD was performed with ease and the       guidewire was secured in the left intrahepatic ducts. Injection of       contrast did not show any duct leaks. The subsequent occlusion       cholangiogram was negative for any biliary filling defects or any bile       duct leaks. Impression:               -  The major papilla appeared normal. Moderate Sedation:      N/A- Per Anesthesia Care Recommendation:           - Patient has a contact number available for                            emergencies. The signs and symptoms of potential                            delayed complications were discussed with the                            patient. Return to normal activities tomorrow.                            Written discharge instructions were provided to the                            patient.                            - Resume regular diet.                           - Continue present medications.                           - No further GI evaluation. Follow up as needed. Procedure Code(s):        --- Professional ---                           [id number], Endoscopic retrograde                            cholangiopancreatography (ERCP); diagnostic,                            including collection of specimen(s) by brushing or                            washing, when performed (separate procedure) Diagnosis Code(s):        --- Professional ---                           Q28.63, Encounter for fitting and adjustment of                            other gastrointestinal appliance and device CPT copyright 2016 [employer]. All rights reserved. The codes documented in this report are preliminary and upon coder review may  be revised to meet current compliance requirements. Carol Ada, MD Carol Ada, MD 05/03/2017 8:41:52 AM This report has been signed electronically. Number of Addenda: 0

## 2017-05-03 NOTE — H&P (Signed)
Whitney Neal HPI: This is a 57 year old female here for stent removal from a bile duct leak.  She had a complicated post operative course with the bile leak and subsequent abscess formation.  All of her external drains were removed two weeks ago and she feels well at this time.  Past Medical History:  Diagnosis Date  . Atypical chest pain   . Barrett's esophagus    history of.  Normal EGD 2011, 2014  . Cervical lymphadenopathy    left  . Cervical strain   . Constipation   . Diabetes mellitus without complication ([place])   . GERD (gastroesophageal reflux disease)   . Hypercholesteremia   . Hyperglycemia    a1c 6.3 7/12  . Hyperplastic colonic polyp    history of   . Hypertension   . Migraine   . Osteopenia   . PONV (postoperative nausea and vomiting)   . Ulcer 2010  . Urinary incontinence   . Vitamin D deficiency     Past Surgical History:  Procedure Laterality Date  . ABDOMINAL HYSTERECTOMY  2009   Dr. Bethann Goo - benign disease  . BLADDER SURGERY     bladder mesh surgery 2010- Dr. Rhodia Albright  . BREAST CYST ASPIRATION    . COLONOSCOPY W/ POLYPECTOMY  2010  . ERCP N/A 03/13/2017   Procedure: ENDOSCOPIC RETROGRADE CHOLANGIOPANCREATOGRAPHY (ERCP);  Surgeon: Carol Ada, MD;  Location: [facility];  Service: Endoscopy;  Laterality: N/A;  . ERCP N/A 03/14/2017   Procedure: ENDOSCOPIC RETROGRADE CHOLANGIOPANCREATOGRAPHY (ERCP);  Surgeon: Ladene Artist, MD;  Location: [facility];  Service: Endoscopy;  Laterality: N/A;  . IR RADIOLOGIST EVAL & MGMT  04/16/2017  . LAPAROSCOPIC CHOLECYSTECTOMY SINGLE PORT N/A 03/09/2017   Procedure: LAPAROSCOPIC CHOLECYSTECTOMY WITH INTRAOPERATIVE CHOLANGIOGRAM, CLOSURE OF ABERRANT DUCT OF LUSCHKA;  Surgeon: Michael Boston, MD;  Location: [facility];  Service: General;  Laterality: N/A;    Family History  Problem Relation Age of Onset  . Colon cancer Father   . Hypertension Father   . Heart attack Father 53  . Cancer Father        prostate CA  .  Pulmonary embolism Mother        deceased  . Hypertension Mother   . Diabetes Sister   . Hypertension Brother   . Hypertension Sister   . Cancer Paternal Grandmother        oral  . Thyroid disease Sister        1/2 sister thyroid problem  . Healthy Daughter   . Heart attack Brother 53  . Breast cancer Cousin     Social History:  reports that  has never smoked. she has never used smokeless tobacco. She reports that she does not drink alcohol or use drugs.  Allergies:  Allergies  Allergen Reactions  . Codeine Other (See Comments)    REACTION: headache, visual disturbance  . Morphine And Related Other (See Comments)    Unknown  . Rocephin [Ceftriaxone Sodium In Dextrose] Rash    Medications: Scheduled: Continuous:  No results found for this or any previous visit (from the past 24 hour(s)).   No results found.  ROS:  As stated above in the HPI otherwise negative.  There were no vitals taken for this visit.    PE: Gen: NAD, Alert and Oriented HEENT:  [place]/AT, EOMI Neck: Supple, no LAD Lungs: CTA Bilaterally CV: RRR without M/G/R ABM: Soft, NTND, +BS Ext: No C/C/E  Assessment/Plan: 1) S/p bile duct leak - ERCP  with stent removal.  Whitney Neal D 05/03/2017, 7:19 AM

## 2017-05-03 NOTE — Anesthesia Procedure Notes (Signed)
Procedure Name: Intubation Date/Time: 05/03/2017 8:05 AM Performed by: Lavina Hamman, CRNA Pre-anesthesia Checklist: Patient identified, Emergency Drugs available, Suction available, Patient being monitored and Timeout performed Patient Re-evaluated:Patient Re-evaluated prior to induction Oxygen Delivery Method: Circle system utilized Preoxygenation: Pre-oxygenation with 100% oxygen Induction Type: IV induction Ventilation: Mask ventilation without difficulty Laryngoscope Size: Mac and 4 Grade View: Grade I Tube type: Oral Tube size: 7.5 mm Number of attempts: 1 Airway Equipment and Method: Stylet Placement Confirmation: ETT inserted through vocal cords under direct vision,  positive ETCO2,  CO2 detector and breath sounds checked- equal and bilateral Secured at: 21 cm Tube secured with: Tape Dental Injury: Teeth and Oropharynx as per pre-operative assessment

## 2017-05-03 NOTE — Discharge Instructions (Signed)
Endoscopic Retrograde Cholangiopancreatogram, Care After °This sheet gives you information about how to care for yourself after your procedure. Your health care provider may also give you more specific instructions. If you have problems or questions, contact your health care provider. °What can I expect after the procedure? °After the procedure, it is common to have: °· Soreness in your throat. °· Nausea. °· Bloating. °· Dizziness. °· Tiredness (fatigue). ° °Follow these instructions at home: °· Take over-the-counter and prescription medicines only as told by your health care provider. °· Do not drive for 24 hours if you were given a medicine to help you relax (sedative) during your procedure. Have someone stay with you for 24 hours after the procedure. °· Return to your normal activities as told by your health care provider. Ask your health care provider what activities are safe for you. °· Return to eating what you normally do as soon as you feel well enough or as told by your health care provider. °· Keep all follow-up visits as told by your health care provider. This is important. °Contact a health care provider if: °· You have pain in your abdomen that does not get better with medicine. °· You develop signs of infection, such as: °? Chills. °? Feeling unwell. °Get help right away if: °· You have difficulty swallowing. °· You have worsening pain in your throat, chest, or abdomen. °· You vomit bright red blood or a substance that looks like coffee grounds. °· You have bloody or very black stools. °· You have a fever. °· You have a sudden increase in swelling (bloating) in your abdomen. °Summary °· After the procedure, it is common to feel tired and to have some discomfort in your throat. °· Contact your health care provider if you have signs of infection--such as chills or feeling unwell--or if you have pain that does not improve with medicine. °· Get help right away if you have trouble swallowing, worsening  pain, bloody or black vomit, bloody or black stools, a fever, or increased swelling in your abdomen. °· Keep all follow-up visits as told by your health care provider. This is important. °This information is not intended to replace advice given to you by your health care provider. Make sure you discuss any questions you have with your health care provider. °Document Released: 11/26/2012 Document Revised: 12/26/2015 Document Reviewed: 12/26/2015 °Elsevier Interactive Patient Education © 2017 [employer] ° °

## 2017-05-03 NOTE — Anesthesia Postprocedure Evaluation (Signed)
Anesthesia Post Note  Patient: Whitney Neal  Procedure(s) Performed: ENDOSCOPIC RETROGRADE CHOLANGIOPANCREATOGRAPHY (ERCP) WITH PROPOFOL (N/A )     Patient location during evaluation: Endoscopy Anesthesia Type: General Level of consciousness: awake and alert Pain management: pain level controlled Vital Signs Assessment: post-procedure vital signs reviewed and stable Respiratory status: spontaneous breathing, nonlabored ventilation, respiratory function stable and patient connected to nasal cannula oxygen Cardiovascular status: blood pressure returned to baseline and stable Postop Assessment: no apparent nausea or vomiting Anesthetic complications: no    Last Vitals:  Vitals:   05/03/17 0850 05/03/17 0900  BP: 122/75 126/83  Pulse: 98 99  Resp: 17 16  Temp:    SpO2: 100% 94%    Last Pain:  Vitals:   05/03/17 0737  TempSrc: Oral                 Barnet Glasgow

## 2017-05-06 ENCOUNTER — Encounter (HOSPITAL_COMMUNITY): Payer: Self-pay | Admitting: Gastroenterology

## 2017-05-14 ENCOUNTER — Other Ambulatory Visit: Payer: 59

## 2017-06-10 MED FILL — METFORMIN HCL ER 500 MG TAB: 500 | 90 days supply | Qty: 90 | Fill #3

## 2017-06-26 ENCOUNTER — Encounter: Payer: 59 | Admitting: Osteopathic Medicine

## 2017-07-03 ENCOUNTER — Telehealth: Payer: Self-pay | Admitting: Osteopathic Medicine

## 2017-07-03 ENCOUNTER — Ambulatory Visit (INDEPENDENT_AMBULATORY_CARE_PROVIDER_SITE_OTHER): Payer: BLUE CROSS/BLUE SHIELD | Admitting: Osteopathic Medicine

## 2017-07-03 ENCOUNTER — Encounter: Payer: Self-pay | Admitting: Osteopathic Medicine

## 2017-07-03 VITALS — BP 157/88 | HR 84 | Temp 98.7°F | Wt 145.2 lb

## 2017-07-03 DIAGNOSIS — E7800 Pure hypercholesterolemia, unspecified: Secondary | ICD-10-CM

## 2017-07-03 DIAGNOSIS — K2100 Gastro-esophageal reflux disease with esophagitis, without bleeding: Secondary | ICD-10-CM

## 2017-07-03 DIAGNOSIS — R7303 Prediabetes: Secondary | ICD-10-CM | POA: Diagnosis not present

## 2017-07-03 DIAGNOSIS — I10 Essential (primary) hypertension: Secondary | ICD-10-CM

## 2017-07-03 DIAGNOSIS — E785 Hyperlipidemia, unspecified: Secondary | ICD-10-CM

## 2017-07-03 DIAGNOSIS — Z0000 Encounter for general adult medical examination without abnormal findings: Secondary | ICD-10-CM | POA: Diagnosis not present

## 2017-07-03 MED ORDER — LOSARTAN POTASSIUM 25 MG PO TABS: 25.0000 mg | ORAL_TABLET | Freq: Every day | ORAL | 3 refills | Status: DC

## 2017-07-03 MED ORDER — ATORVASTATIN CALCIUM 40 MG PO TABS: 40.0000 mg | ORAL_TABLET | Freq: Every day | ORAL | 3 refills | Status: DC

## 2017-07-03 MED ORDER — METFORMIN HCL ER 500 MG PO TB24: 500.0000 mg | ORAL_TABLET | Freq: Every day | ORAL | 3 refills | Status: DC

## 2017-07-03 MED FILL — ATORVASTATIN 40 MG TABLET: 40 | 30 days supply | Qty: 30 | Fill #0

## 2017-07-03 MED FILL — METFORMIN HCL ER 500 MG TAB: 500 | 30 days supply | Qty: 30 | Fill #0

## 2017-07-03 MED FILL — LOSARTAN POTASSIUM 25 MG TA: 25 | 30 days supply | Qty: 30 | Fill #0

## 2017-07-03 NOTE — Telephone Encounter (Signed)
-----   Message from Emeterio Reeve, DO sent at 07/03/2017 10:06 AM EDT ----- Regarding: Shingrix  On list for shingles!

## 2017-07-03 NOTE — Telephone Encounter (Signed)
Added

## 2017-07-03 NOTE — Progress Notes (Signed)
HPI: Whitney Neal is a 57 y.o. female who  has a past medical history of Atypical chest pain, Barrett's esophagus, Cervical lymphadenopathy, Cervical strain, Constipation, Diabetes mellitus without complication ([place]), GERD (gastroesophageal reflux disease), Hypercholesteremia, Hyperglycemia, Hyperplastic colonic polyp, Hypertension, Migraine, Osteopenia, PONV (postoperative nausea and vomiting), Ulcer (2010), Urinary incontinence, and Vitamin D deficiency.  she presents to [facility] today, 07/03/17,  for chief complaint of: Annual Physical   Patient here for annual physical / wellness exam.  See preventive care reviewed as below.  Recent labs reviewed in detail with the patient.   Additional concerns today include:   HTN - didn't take her meds, she was fasting for this visit.   Doing much better w/ abd pain, complicated course w/ abdominal abscess and sepsis       Past medical, surgical, social and family history reviewed:  Patient Active Problem List   Diagnosis Date Noted  . Hepatic abscesses s/p perc drainage 04/04/2017 04/04/2017  . Nausea & vomiting 04/02/2017  . Delayed bile leak s/p ERCP/stenting 03/14/2017   . Acute on chronic cholecystitis s/p lap cholecystectomy 03/09/2017 03/09/2017  . 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 Constipation   . Hemangioma of liver 02/04/2017  . Elevated fasting glucose 05/23/2016  . Migraine 10/01/2011  . Numbness and tingling in left hand 01/05/2011  . Hyperglycemia   . Essential hypertension 01/25/2010  . GERD 01/25/2010  . COLONIC POLYPS, HYPERPLASTIC, HX OF 01/25/2010  . CERVICAL STRAIN 11/23/2009  . Hyperlipidemia 09/26/2009  . OSTEOPENIA 07/11/2009  . GASTRIC ULCER, HX OF 07/11/2009  . History of Barrett's esophagus 07/11/2009  . Vitamin D deficiency 07/04/2009  . CHEST PAIN, ATYPICAL, HX OF 07/04/2009    Past Surgical History:  Procedure  Laterality Date  . ABDOMINAL HYSTERECTOMY  2009   Dr. Bethann Goo - benign disease  . BLADDER SURGERY     bladder mesh surgery 2010- Dr. Rhodia Albright  . BREAST CYST ASPIRATION    . COLONOSCOPY W/ POLYPECTOMY  2010  . ENDOSCOPIC RETROGRADE CHOLANGIOPANCREATOGRAPHY (ERCP) WITH PROPOFOL N/A 05/03/2017   Procedure: ENDOSCOPIC RETROGRADE CHOLANGIOPANCREATOGRAPHY (ERCP) WITH PROPOFOL;  Surgeon: Carol Ada, MD;  Location: [facility];  Service: Endoscopy;  Laterality: N/A;  . ERCP N/A 03/13/2017   Procedure: ENDOSCOPIC RETROGRADE CHOLANGIOPANCREATOGRAPHY (ERCP);  Surgeon: Carol Ada, MD;  Location: [place];  Service: Endoscopy;  Laterality: N/A;  . ERCP N/A 03/14/2017   Procedure: ENDOSCOPIC RETROGRADE CHOLANGIOPANCREATOGRAPHY (ERCP);  Surgeon: Ladene Artist, MD;  Location: [facility];  Service: Endoscopy;  Laterality: N/A;  . IR RADIOLOGIST EVAL & MGMT  04/16/2017  . LAPAROSCOPIC CHOLECYSTECTOMY SINGLE PORT N/A 03/09/2017   Procedure: LAPAROSCOPIC CHOLECYSTECTOMY WITH INTRAOPERATIVE CHOLANGIOGRAM, CLOSURE OF ABERRANT DUCT OF LUSCHKA;  Surgeon: Michael Boston, MD;  Location: [facility];  Service: General;  Laterality: N/A;    Social History   Tobacco Use  . Smoking status: Never Smoker  . Smokeless tobacco: Never Used  Substance Use Topics  . Alcohol use: No    Family History  Problem Relation Age of Onset  . Colon cancer Father   . Hypertension Father   . Heart attack Father 21  . Cancer Father        prostate CA  . Pulmonary embolism Mother        deceased  . Hypertension Mother   . Diabetes Sister   . Hypertension Brother   . Hypertension Sister   . Cancer Paternal Grandmother  oral  . Thyroid disease Sister        1/2 sister thyroid problem  . Healthy Daughter   . Heart attack Brother 57  . Breast cancer Cousin      Current medication list and allergy/intolerance information reviewed:    Current Outpatient Medications  Medication Sig Dispense Refill  . acetaminophen  (TYLENOL) 500 MG tablet Take 1000 mg of Tylenol every 6 hours as needed for pain.   DO NOT TAKE MORE THAN 4000 MG OF TYLENOL PER DAY.  IT CAN HARM YOUR LIVER. (Patient taking differently: Take 1,000 mg by mouth every 6 (six) hours as needed for moderate pain or headache. ) 30 tablet 0  . aspirin 81 MG EC tablet Take 81 mg by mouth daily.      Marland Kitchen atorvastatin (LIPITOR) 40 MG tablet Take 1 tablet (40 mg total) by mouth daily. 90 tablet 3  . fluticasone (FLONASE) 50 MCG/ACT nasal spray Place 1 spray into both nostrils daily as needed for allergies or rhinitis.    Marland Kitchen ibuprofen (ADVIL,MOTRIN) 200 MG tablet You can take 2-3 tablets every 6 hours as needed for pain.  You can alternate with plain Tylenol also. You can buy this at any drug store over the counter. (Patient taking differently: Take 200 mg by mouth every 6 (six) hours as needed for headache or moderate pain. )    . losartan (COZAAR) 25 MG tablet Take 1 tablet (25 mg total) daily by mouth. 90 tablet 3  . metFORMIN (GLUCOPHAGE XR) 500 MG 24 hr tablet Take 1 tablet (500 mg total) by mouth daily with breakfast. 90 tablet 3  . Multiple Vitamins-Minerals (MULTIVITAMIN PO) Take 1 tablet by mouth daily.    Marland Kitchen omeprazole (PRILOSEC) 40 MG capsule TAKE 1 CAPSULE (40 MG TOTAL) BY MOUTH DAILY. 30 capsule 3   No current facility-administered medications for this visit.     Allergies  Allergen Reactions  . Codeine Other (See Comments)    REACTION: headache, visual disturbance  . Morphine And Related Other (See Comments)    Unknown  . Rocephin [Ceftriaxone Sodium In Dextrose] Rash      Review of Systems:  Constitutional:  No  fever, no chills, No recent illness,  HEENT: No  headache, no vision change, no hearing change  Cardiac: No  chest pain, No  pressure, No palpitations,  Respiratory:  No  shortness of breath. No  Cough  Gastrointestinal: +occasional RUQ abdominal pain, No  nausea, No  vomiting,  No  blood in stool, No  diarrhea, No   constipation   Musculoskeletal: No new myalgia/arthralgia  Skin: No  Rash, No other wounds/concerning lesions  Genitourinary: No  incontinence, No  abnormal genital bleeding, No abnormal genital discharge  Hem/Onc: No  easy bruising/bleeding  Neurologic: No  weakness, No  dizziness  Psychiatric: No  concerns with depression, No  concerns with anxiety, No sleep problems, No mood problems  Exam:  BP (!) 157/88 (BP Location: Left Arm, Patient Position: Sitting, Cuff Size: Normal)   Pulse 84   Temp 98.7 F (37.1 C) (Oral)   Wt 145 lb 3.2 oz (65.9 kg)   BMI 27.44 kg/m   Constitutional: VS see above. General Appearance: alert, well-developed, well-nourished, NAD  Eyes: Normal lids and conjunctive, non-icteric sclera  Ears, Nose, Mouth, Throat: MMM, Normal external inspection ears/nares/mouth/lips/gums. TM normal bilaterally. Pharynx/tonsils no erythema, no exudate. Nasal mucosa normal.   Neck: No masses, trachea midline. No thyroid enlargement. No tenderness/mass appreciated. No lymphadenopathy  Respiratory: Normal respiratory effort. no wheeze, no rhonchi, no rales  Cardiovascular: S1/S2 normal, no murmur, no rub/gallop auscultated. RRR. No lower extremity edema  Gastrointestinal: Nontender, no masses. No hepatomegaly, no splenomegaly. No hernia appreciated. Bowel sounds normal. Rectal exam deferred.   Musculoskeletal: Gait normal. No clubbing/cyanosis of digits.   Neurological: Normal balance/coordination. No tremor. No cranial nerve deficit on limited exam. Motor and sensation intact and symmetric. Cerebellar reflexes intact.   Skin: warm, dry, intact. No rash/ulcer. No concerning nevi or subq nodules on limited exam.    Psychiatric: Normal judgment/insight. Normal mood and affect. Oriented x3.      ASSESSMENT/PLAN:   Annual physical exam - Plan: CBC, COMPLETE METABOLIC PANEL WITH GFR, Lipid panel  Hyperlipidemia, unspecified hyperlipidemia type - Plan: Lipid  panel  Essential hypertension - Plan: losartan (COZAAR) 25 MG tablet  Gastroesophageal reflux disease with esophagitis  Prediabetes - Plan: Hemoglobin A1c, metFORMIN (GLUCOPHAGE XR) 500 MG 24 hr tablet  High cholesterol - Plan: atorvastatin (LIPITOR) 40 MG tablet  Essential hypertension - . - Plan: losartan (COZAAR) 25 MG tablet   FEMALE PREVENTIVE CARE Updated 07/03/17   ANNUAL SCREENING/COUNSELING  Diet/Exercise - HEALTHY HABITS DISCUSSED TO DECREASE CV RISK Social History   Tobacco Use  Smoking Status Never Smoker  Smokeless Tobacco Never Used   Social History   Substance and Sexual Activity  Alcohol Use No    Depression screen PHQ 2/9 07/03/2017  Decreased Interest 0  Down, Depressed, Hopeless 0  PHQ - 2 Score 0  Altered sleeping 0  Tired, decreased energy 0  Change in appetite 0  Feeling bad or failure about yourself  0  Trouble concentrating 0  Moving slowly or fidgety/restless 0  Suicidal thoughts 0  PHQ-9 Score 0  Difficult doing work/chores Not difficult at all    Domestic violence concerns - no  HTN SCREENING - SEE [place]  Sexually active in the past year - Yes with female.  Need/want STI testing today? - no  Concerns about libido or pain with sex? - no  Plans for pregnancy? - n/a  INFECTIOUS DISEASE SCREENING  HIV - does not need  GC/CT - does not need  HepC - DOB [birth year] - does not need  TB - does not need  DISEASE SCREENING  Lipid - needs  DM2 - needs  Osteoporosis - women age 14+ - does not need  Fracture after age 34? no  Chronic steroid use? no  Smoking/Alcohol? no  Low body weight <127lb? no  Hip fracture in parent? no  Premature menopause, malabsorbtion, CLD, IBD, RA? no  CANCER SCREENING  Cervical - does not need  Breast - does not need  Lung - does not need  Colon - does not need  ADULT VACCINATION  Influenza - annual vaccine recommended  Td - booster every 10 years   Zoster -  Shingrix recommended 50+  PCV13 - was not indicated  PPSV23 - was not indicated Immunization History  Administered Date(s) Administered  . Influenza Split 12/21/2010, 11/20/2011  . Influenza,inj,Quad PF,6+ Mos 11/20/2014  . Influenza-Unspecified 11/27/2013  . Td 09/01/2008    OTHER  Fall - exercise and Vit D age 104+ - does not need      \  Visit summary with medication list and pertinent instructions was printed for patient to review. All questions at time of visit were answered - patient instructed to contact office with any additional concerns. ER/RTC precautions were reviewed with the patient.  Follow-up plan: Return in about 6 months (around 01/03/2018) for recheck BP, A1C prediabetes .  \ Please note: voice recognition software was used to produce this document, and typos may escape review. Please contact Dr. Sheppard Coil for any needed clarifications.

## 2017-07-04 LAB — CBC
HCT: 41 % (ref 35.0–45.0)
Hemoglobin: 13.6 g/dL (ref 11.7–15.5)
MCH: 27.5 pg (ref 27.0–33.0)
MCHC: 33.2 g/dL (ref 32.0–36.0)
MCV: 82.8 fL (ref 80.0–100.0)
MPV: 11 fL (ref 7.5–12.5)
Platelets: 336 10*3/uL (ref 140–400)
RBC: 4.95 10*6/uL (ref 3.80–5.10)
RDW: 14.7 % (ref 11.0–15.0)
WBC: 4.4 10*3/uL (ref 3.8–10.8)

## 2017-07-04 LAB — COMPLETE METABOLIC PANEL WITH GFR
AG Ratio: 1.5 (calc) (ref 1.0–2.5)
ALT: 16 U/L (ref 6–29)
AST: 17 U/L (ref 10–35)
Albumin: 4.3 g/dL (ref 3.6–5.1)
Alkaline phosphatase (APISO): 105 U/L (ref 33–130)
BUN/Creatinine Ratio: 33 (calc) — ABNORMAL HIGH (ref 6–22)
BUN: 16 mg/dL (ref 7–25)
CO2: 27 mmol/L (ref 20–32)
Calcium: 9.7 mg/dL (ref 8.6–10.4)
Chloride: 107 mmol/L (ref 98–110)
Creat: 0.49 mg/dL — ABNORMAL LOW (ref 0.50–1.05)
GFR, Est African American: 124 mL/min/{1.73_m2} (ref >=60)
GFR, Est Non African American: 107 mL/min/{1.73_m2} (ref >=60)
Globulin: 2.9 g/dL (calc) (ref 1.9–3.7)
Glucose, Bld: 98 mg/dL (ref 65–99)
Potassium: 4.8 mmol/L (ref 3.5–5.3)
Sodium: 143 mmol/L (ref 135–146)
Total Bilirubin: 0.4 mg/dL (ref 0.2–1.2)
Total Protein: 7.2 g/dL (ref 6.1–8.1)

## 2017-07-04 LAB — LIPID PANEL
Cholesterol: 137 mg/dL (ref <200)
HDL: 54 mg/dL (ref >50)
LDL Cholesterol (Calc): 68 mg/dL (calc)
Non-HDL Cholesterol (Calc): 83 mg/dL (calc) (ref <130)
Total CHOL/HDL Ratio: 2.5 (calc) (ref <5.0)
Triglycerides: 74 mg/dL (ref <150)

## 2017-07-04 LAB — HEMOGLOBIN A1C
Hgb A1c MFr Bld: 5.8 % of total Hgb — ABNORMAL HIGH (ref <5.7)
Mean Plasma Glucose: 120 (calc)
eAG (mmol/L): 6.6 (calc)

## 2017-08-26 MED FILL — LOSARTAN POTASSIUM 25 MG TA: 25 | 30 days supply | Qty: 30 | Fill #1

## 2017-08-26 MED FILL — ATORVASTATIN 40 MG TABLET: 40 | 30 days supply | Qty: 30 | Fill #1

## 2017-09-26 MED FILL — LOSARTAN POTASSIUM 25 MG TA: 25 | 30 days supply | Qty: 30 | Fill #2

## 2017-09-26 MED FILL — ATORVASTATIN 40 MG TABLET: 40 | 30 days supply | Qty: 30 | Fill #2

## 2017-11-04 MED FILL — LOSARTAN POTASSIUM 25 MG TA: 25 | 30 days supply | Qty: 30 | Fill #3

## 2017-11-18 MED FILL — OMEPRAZOLE 40 MG CPDR: 40 | 30 days supply | Qty: 30 | Fill #1

## 2017-12-17 MED FILL — ATORVASTATIN 40 MG TABLET: 40 | 30 days supply | Qty: 30 | Fill #3

## 2017-12-17 MED FILL — LOSARTAN POTASSIUM 25 MG TA: 25 | 30 days supply | Qty: 30 | Fill #4

## 2017-12-17 MED FILL — metFORMIN HCL ER 500 MG TB2: 500 | 30 days supply | Qty: 30 | Fill #1

## 2017-12-20 ENCOUNTER — Other Ambulatory Visit: Payer: Self-pay | Admitting: Osteopathic Medicine

## 2017-12-20 DIAGNOSIS — Z1231 Encounter for screening mammogram for malignant neoplasm of breast: Secondary | ICD-10-CM

## 2017-12-23 ENCOUNTER — Ambulatory Visit
Admission: RE | Admit: 2017-12-23 | Discharge: 2017-12-23 | Disposition: A | Discharge status: Home / Self Care | Payer: BLUE CROSS/BLUE SHIELD | Source: Ambulatory Visit

## 2017-12-23 DIAGNOSIS — Z1231 Encounter for screening mammogram for malignant neoplasm of breast: Secondary | ICD-10-CM | POA: Diagnosis not present

## 2017-12-30 ENCOUNTER — Encounter: Payer: Self-pay | Admitting: Osteopathic Medicine

## 2017-12-30 ENCOUNTER — Ambulatory Visit (INDEPENDENT_AMBULATORY_CARE_PROVIDER_SITE_OTHER): Payer: BLUE CROSS/BLUE SHIELD | Admitting: Osteopathic Medicine

## 2017-12-30 VITALS — BP 139/79 | HR 82 | Temp 98.3°F | Wt 156.6 lb

## 2017-12-30 DIAGNOSIS — R42 Dizziness and giddiness: Secondary | ICD-10-CM | POA: Diagnosis not present

## 2017-12-30 DIAGNOSIS — Z23 Encounter for immunization: Secondary | ICD-10-CM | POA: Diagnosis not present

## 2017-12-30 DIAGNOSIS — R7303 Prediabetes: Secondary | ICD-10-CM

## 2017-12-30 LAB — POCT GLYCOSYLATED HEMOGLOBIN (HGB A1C): Hemoglobin A1C: 6.1 % — AB (ref 4.0–5.6)

## 2017-12-30 NOTE — Progress Notes (Signed)
HPI: Whitney Neal is a 57 y.o. 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 03/09/2017 (03/09/2017), Acute on chronic cholecystitis s/p lap cholecystectomy 03/09/2017 (03/09/2017), Atypical chest pain, Barrett's esophagus, Cervical lymphadenopathy, Cervical strain, Constipation, Delayed bile leak s/p ERCP/stenting 03/14/2017, Diabetes mellitus without complication ([place]), GERD (gastroesophageal reflux disease), Hepatic abscesses s/p perc drainage 04/04/2017 (04/04/2017), Hypercholesteremia, Hyperglycemia, Hyperplastic colonic polyp, Hypertension, Migraine, Osteopenia, PONV (postoperative nausea and vomiting), Ulcer (2010), Urinary incontinence, and Vitamin D deficiency.  she presents to [facility] today, 12/30/17,  for chief complaint of:  Prediabetes follow-up  A1C have been stable in pre-DM range for years Error on initial POC A1C check - Hgb too low - will get CBC to be safe since she reports rare dizziness spells w/o LOC or HA/VC. Repeat test showed A1C 6.1%   Component     Latest Ref Rng & Units 12/29/2013 07/26/2014 11/29/2014 05/23/2016  Hemoglobin A1C     <5.7 % of total Hgb 6.0 5.9 6.0 5.7  Mean Plasma Glucose     (calc)      eAG (mmol/L)     (calc)       Component     Latest Ref Rng & Units 08/23/2016 12/27/2016 03/09/2017 07/03/2017  Hemoglobin A1C     <5.7 % of total Hgb 5.8 5.8 5.7 (H) 5.8 (H)  Mean Plasma Glucose     (calc)   116.89 120  eAG (mmol/L)     (calc)    6.6      BP Readings from Last 3 Encounters:  12/30/17 139/79  07/03/17 (!) 157/88  05/03/17 123/78            Past medical history, surgical history, and family history reviewed.  Current medication list and allergy/intolerance information reviewed.   (See remainder of HPI, ROS, Phys Exam below)   Results for orders placed or performed in visit on 12/30/17 (from the past 72 hour(s))  POCT HgB A1C     Status:  Abnormal   Collection Time: 12/30/17 10:31 AM  Result Value Ref Range   Hemoglobin A1C 6.1 (A) 4.0 - 5.6 %   HbA1c POC (<> result, manual entry)     HbA1c, POC (prediabetic range)     HbA1c, POC (controlled diabetic range)       ASSESSMENT/PLAN:   Prediabetes - Plan: POCT HgB A1C, CBC, COMPLETE METABOLIC PANEL WITH GFR  Need for influenza vaccination - Plan: Flu Vaccine QUAD 6+ mos PF IM (Fluarix Quad PF)  Need for shingles vaccine - patient declines this measure - risks/benefits reviewed  Dizzy spells - Plan: CBC, COMPLETE METABOLIC PANEL WITH GFR   No orders of the defined types were placed in this encounter.    Follow-up plan: Return in about 6 months (around 06/30/2018) for annual physical with routine fasting blood work - see me sooner if needed! .                            ############################################ ############################################ ############################################ ############################################    Outpatient Encounter Medications as of 12/30/2017  Medication Sig  . acetaminophen (TYLENOL) 500 MG tablet Take 1000 mg of Tylenol every 6 hours as needed for pain.   DO NOT TAKE MORE THAN 4000 MG OF TYLENOL PER DAY.  IT CAN HARM YOUR LIVER. (Patient taking differently: Take 1,000 mg by mouth every 6 (six) hours as needed  for moderate pain or headache. )  . aspirin 81 MG EC tablet Take 81 mg by mouth daily.    Marland Kitchen atorvastatin (LIPITOR) 40 MG tablet Take 1 tablet (40 mg total) by mouth daily.  . fluticasone (FLONASE) 50 MCG/ACT nasal spray Place 1 spray into both nostrils daily as needed for allergies or rhinitis.  Marland Kitchen ibuprofen (ADVIL,MOTRIN) 200 MG tablet You can take 2-3 tablets every 6 hours as needed for pain.  You can alternate with plain Tylenol also. You can buy this at any drug store over the counter. (Patient taking differently: Take 200 mg by mouth every 6 (six) hours as needed for  headache or moderate pain. )  . losartan (COZAAR) 25 MG tablet Take 1 tablet (25 mg total) by mouth daily.  . metFORMIN (GLUCOPHAGE XR) 500 MG 24 hr tablet Take 1 tablet (500 mg total) by mouth daily with breakfast.  . Multiple Vitamins-Minerals (MULTIVITAMIN PO) Take 1 tablet by mouth daily.  Marland Kitchen omeprazole (PRILOSEC) 40 MG capsule TAKE 1 CAPSULE (40 MG TOTAL) BY MOUTH DAILY.   No facility-administered encounter medications on file as of 12/30/2017.    Allergies  Allergen Reactions  . Codeine Other (See Comments)    REACTION: headache, visual disturbance  . Morphine Other (See Comments)    Unknown  . Morphine And Related Other (See Comments)    Unknown  . Other Rash    Ceftriaxone Sodium In Dextrose  . Rocephin [Ceftriaxone Sodium In Dextrose] Rash      Review of Systems:  Constitutional: No recent illness  HEENT: No  headache, no vision change  Cardiac: No  chest pain, No  pressure, No palpitations  Respiratory:  No  shortness of breath. No  Cough  Gastrointestinal: No  abdominal pain, no change on bowel habits, no black/bloody stool  Musculoskeletal: No new myalgia/arthralgia  Skin: No  Rash  Hem/Onc: No  easy bruising/bleeding, No  abnormal lumps/bumps  Neurologic: No  weakness, +Dizziness  Psychiatric: No  concerns with depression, No  concerns with anxiety  Exam:  BP 139/79 (BP Location: Left Arm, Patient Position: Sitting, Cuff Size: Normal)   Pulse 82   Temp 98.3 F (36.8 C) (Oral)   Wt 156 lb 9.6 oz (71 kg)   BMI 29.59 kg/m   Constitutional: VS see above. General Appearance: alert, well-developed, well-nourished, NAD  Eyes: Normal lids and conjunctive, non-icteric sclera  Ears, Nose, Mouth, Throat: MMM, Normal external inspection ears/nares/mouth/lips/gums.  Neck: No masses, trachea midline.   Respiratory: Normal respiratory effort. no wheeze, no rhonchi, no rales  Cardiovascular: S1/S2 normal, no murmur, no rub/gallop auscultated. RRR.    Musculoskeletal: Gait normal. Symmetric and independent movement of all extremities  Neurological: Normal balance/coordination. No tremor.  Skin: warm, dry, intact.   Psychiatric: Normal judgment/insight. Normal mood and affect. Oriented x3.   Visit summary with medication list and pertinent instructions was printed for patient to review, advised to alert [place] if any changes needed. All questions at time of visit were answered - patient instructed to contact office with any additional concerns. ER/RTC precautions were reviewed with the patient and understanding verbalized.   Follow-up plan: Return in about 6 months (around 06/30/2018) for annual physical with routine fasting blood work - see me sooner if needed! .  Note: Total time spent 25 minutes, greater than 50% of the visit was spent face-to-face counseling and coordinating care for the following: The primary encounter diagnosis was Prediabetes. Diagnoses of Need for influenza vaccination, Need for shingles vaccine,  and Dizzy spells were also pertinent to this visit.Marland Kitchen  Please note: voice recognition software was used to produce this document, and typos may escape review. Please contact Dr. Sheppard Coil for any needed clarifications.

## 2017-12-31 LAB — COMPLETE METABOLIC PANEL WITH GFR
AG Ratio: 1.7 (calc) (ref 1.0–2.5)
ALT: 17 U/L (ref 6–29)
AST: 16 U/L (ref 10–35)
Albumin: 4.4 g/dL (ref 3.6–5.1)
Alkaline phosphatase (APISO): 104 U/L (ref 33–130)
BUN: 14 mg/dL (ref 7–25)
CO2: 30 mmol/L (ref 20–32)
Calcium: 10 mg/dL (ref 8.6–10.4)
Chloride: 105 mmol/L (ref 98–110)
Creat: 0.62 mg/dL (ref 0.50–1.05)
GFR, Est African American: 114 mL/min/{1.73_m2} (ref >=60)
GFR, Est Non African American: 99 mL/min/{1.73_m2} (ref >=60)
Globulin: 2.6 g/dL (calc) (ref 1.9–3.7)
Glucose, Bld: 105 mg/dL — ABNORMAL HIGH (ref 65–99)
Potassium: 4.8 mmol/L (ref 3.5–5.3)
Sodium: 142 mmol/L (ref 135–146)
Total Bilirubin: 0.4 mg/dL (ref 0.2–1.2)
Total Protein: 7 g/dL (ref 6.1–8.1)

## 2017-12-31 LAB — CBC
HCT: 41.4 % (ref 35.0–45.0)
Hemoglobin: 13.9 g/dL (ref 11.7–15.5)
MCH: 28.2 pg (ref 27.0–33.0)
MCHC: 33.6 g/dL (ref 32.0–36.0)
MCV: 84 fL (ref 80.0–100.0)
MPV: 11.6 fL (ref 7.5–12.5)
Platelets: 311 10*3/uL (ref 140–400)
RBC: 4.93 10*6/uL (ref 3.80–5.10)
RDW: 12.8 % (ref 11.0–15.0)
WBC: 4.3 10*3/uL (ref 3.8–10.8)

## 2018-01-14 MED FILL — metFORMIN HCL ER 500 MG TB2: 500 | 30 days supply | Qty: 30 | Fill #2

## 2018-01-14 MED FILL — ATORVASTATIN 40 MG TABLET: 40 | 30 days supply | Qty: 30 | Fill #4

## 2018-01-14 MED FILL — LOSARTAN POTASSIUM 25 MG TA: 25 | 30 days supply | Qty: 30 | Fill #5

## 2018-02-14 MED FILL — metFORMIN HCL ER 500 MG TB2: 500 | 30 days supply | Qty: 30 | Fill #3

## 2018-02-14 MED FILL — LOSARTAN POTASSIUM 25 MG TA: 25 | 30 days supply | Qty: 30 | Fill #6

## 2018-02-14 MED FILL — ATORVASTATIN 40 MG TABLET: 40 | 30 days supply | Qty: 30 | Fill #5

## 2018-03-18 MED FILL — LOSARTAN POTASSIUM 25 MG TA: 25 | 30 days supply | Qty: 30 | Fill #7

## 2018-03-18 MED FILL — ATORVASTATIN 40 MG TABLET: 40 | 30 days supply | Qty: 30 | Fill #6

## 2018-03-18 MED FILL — metFORMIN HCL ER 500 MG TB2: 500 | 30 days supply | Qty: 30 | Fill #4

## 2018-04-09 MED FILL — OMEPRAZOLE 40 MG CPDR: 40 | 30 days supply | Qty: 30 | Fill #2

## 2018-04-21 MED FILL — ATORVASTATIN 40 MG TABLET: 40 | 30 days supply | Qty: 30 | Fill #7

## 2018-04-21 MED FILL — LOSARTAN POTASSIUM 25 MG TA: 25 | 30 days supply | Qty: 30 | Fill #8

## 2018-04-21 MED FILL — metFORMIN HCL ER 500 MG TB2: 500 | 30 days supply | Qty: 30 | Fill #5

## 2018-04-30 ENCOUNTER — Ambulatory Visit (INDEPENDENT_AMBULATORY_CARE_PROVIDER_SITE_OTHER): Payer: 59 | Admitting: Osteopathic Medicine

## 2018-04-30 ENCOUNTER — Other Ambulatory Visit: Payer: Self-pay

## 2018-04-30 ENCOUNTER — Encounter: Payer: Self-pay | Admitting: Osteopathic Medicine

## 2018-04-30 VITALS — BP 142/71 | HR 73 | Temp 98.1°F | Wt 155.8 lb

## 2018-04-30 DIAGNOSIS — R1084 Generalized abdominal pain: Secondary | ICD-10-CM | POA: Diagnosis not present

## 2018-04-30 DIAGNOSIS — Z87898 Personal history of other specified conditions: Secondary | ICD-10-CM

## 2018-04-30 DIAGNOSIS — R300 Dysuria: Secondary | ICD-10-CM

## 2018-04-30 DIAGNOSIS — R140 Abdominal distension (gaseous): Secondary | ICD-10-CM | POA: Diagnosis not present

## 2018-04-30 DIAGNOSIS — Z9889 Other specified postprocedural states: Secondary | ICD-10-CM

## 2018-04-30 MED ORDER — ONDANSETRON 8 MG PO TBDP: 8.0000 mg | ORAL_TABLET | Freq: Three times a day (TID) | ORAL | 3 refills | Status: DC | PRN

## 2018-04-30 MED ORDER — DICYCLOMINE HCL 20 MG PO TABS: 20.0000 mg | ORAL_TABLET | Freq: Four times a day (QID) | ORAL | 1 refills | Status: DC | PRN

## 2018-04-30 MED FILL — ONDANSETRON ODT 8 MG TABLET: 8 | 7 days supply | Qty: 20 | Fill #0

## 2018-04-30 MED FILL — DICYCLOMINE 20 MG TABLET: 20 | 15 days supply | Qty: 60 | Fill #0

## 2018-04-30 NOTE — Progress Notes (Signed)
HPI: Whitney Neal is a 58 y.o. 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 03/09/2017 (03/09/2017), Acute on chronic cholecystitis s/p lap cholecystectomy 03/09/2017 (03/09/2017), Atypical chest pain, Barrett's esophagus, Cervical lymphadenopathy, Cervical strain, Constipation, Delayed bile leak s/p ERCP/stenting 03/14/2017, Diabetes mellitus without complication ([place]), GERD (gastroesophageal reflux disease), Hepatic abscesses s/p perc drainage 04/04/2017 (04/04/2017), Hypercholesteremia, Hyperglycemia, Hyperplastic colonic polyp, Hypertension, Migraine, Osteopenia, PONV (postoperative nausea and vomiting), Ulcer (2010), Urinary incontinence, and Vitamin D deficiency.  she presents to [facility] today, 04/30/18,  for chief complaint of:  Abdominal pain   . Location: general abdomen, bit worse epigastrum . Quality: bloating, feeling full . Duration: 1 month . Timing: most days it's there. Some days better, some days worse.  . Assoc signs/symptoms: nausea, fatigue. Had some burning with urination last week, requests UA        At today's visit 04/30/18 ... PMH, PSH, FH reviewed and updated as needed.  Current medication list and allergy/intolerance hx reviewed and updated as needed. (See remainder of HPI, ROS, Phys Exam below)         ASSESSMENT/PLAN: The primary encounter diagnosis was Generalized abdominal pain. Diagnoses of Abdominal bloating, H/O major abdominal surgery, History of prediabetes, and Dysuria were also pertinent to this visit.   Orders Placed This Encounter  Procedures  . CBC with Differential/Platelet  . COMPLETE METABOLIC PANEL WITH GFR  . Hemoglobin A1c  . Lipase  . Urinalysis, Routine w reflex microscopic     Meds ordered this encounter  Medications  . dicyclomine (BENTYL) 20 MG tablet    Sig: Take 1 tablet (20 mg total) by mouth 4 (four) times daily as needed for  spasms.    Dispense:  60 tablet    Refill:  1  . ondansetron (ZOFRAN-ODT) 8 MG disintegrating tablet    Sig: Take 1 tablet (8 mg total) by mouth every 8 (eight) hours as needed for nausea.    Dispense:  20 tablet    Refill:  3    Patient Instructions  Plan:  I think your symptoms are most likely caused by scar tissue from previous surgery/infections.  This can cause some restriction in the movement of the digestive system which can lead to nausea, bloating, cramping, etc. if you are having severe pain, especially if vomiting, inability to pass stool, or fever, this is an emergency!  Right now, I think we are okay to try some medications for nausea and for bloating, eat small frequent snacks rather than large meals, and we will get some lab work.  If symptoms persist by the end of this week, we may consider a CT scan.  If you are having painful or difficult swallowing, we may need to send to a GI doctor to consider an endoscopy or other testing.   Please call if any questions or problems!        Follow-up plan: Return if symptoms worsen or fail to improve / depending on labs .                                                 ################################################# ################################################# ################################################# #################################################    Current Meds  Medication Sig  . aspirin 81 MG EC tablet Take 81 mg by mouth daily.    Marland Kitchen  atorvastatin (LIPITOR) 40 MG tablet Take 1 tablet (40 mg total) by mouth daily.  . fluticasone (FLONASE) 50 MCG/ACT nasal spray Place 1 spray into both nostrils daily as needed for allergies or rhinitis.  Marland Kitchen losartan (COZAAR) 25 MG tablet Take 1 tablet (25 mg total) by mouth daily.  . metFORMIN (GLUCOPHAGE XR) 500 MG 24 hr tablet Take 1 tablet (500 mg total) by mouth daily with breakfast.  . Multiple Vitamins-Minerals  (MULTIVITAMIN PO) Take 1 tablet by mouth daily.  Marland Kitchen omeprazole (PRILOSEC) 40 MG capsule TAKE 1 CAPSULE (40 MG TOTAL) BY MOUTH DAILY.    Allergies  Allergen Reactions  . Codeine Other (See Comments)    REACTION: headache, visual disturbance  . Morphine Other (See Comments)    Unknown  . Morphine And Related Other (See Comments)    Unknown  . Other Rash    Ceftriaxone Sodium In Dextrose  . Rocephin [Ceftriaxone Sodium In Dextrose] Rash       Review of Systems:  Constitutional: No recent illness, no fever  Cardiac: No  chest pain, No  pressure, No palpitations  Respiratory:  No  shortness of breath. No  Cough  Gastrointestinal: +abdominal pain, +nausea, no change on bowel habits, no vomiting   Musculoskeletal: No new myalgia/arthralgia  Skin: No  Rash  Neurologic: No  weakness, No  Dizziness   Exam:  BP (!) 142/71 (BP Location: Left Arm, Patient Position: Sitting, Cuff Size: Normal)   Pulse 73   Temp 98.1 F (36.7 C) (Oral)   Wt 155 lb 12.8 oz (70.7 kg)   BMI 29.44 kg/m   Constitutional: VS see above. General Appearance: alert, well-developed, well-nourished, NAD  Eyes: Normal lids and conjunctive, non-icteric sclera  Ears, Nose, Mouth, Throat: MMM, Normal external inspection ears/nares/mouth/lips/gums.  Neck: No masses, trachea midline.   Respiratory: Normal respiratory effort.   Musculoskeletal: Gait normal. Symmetric and independent movement of all extremities  Abdominal: +TTp RUQ/epigastrum, no guarding/rebound, non-distended, no appreciable organomegaly, neg Murphy's, BS [id number]  Neurological: Normal balance/coordination. No tremor.  Skin: warm, dry, intact.   Psychiatric: Normal judgment/insight. Normal mood and affect. Oriented x3.       Visit summary with medication list and pertinent instructions was printed for patient to review, patient was advised to alert [place] if any updates are needed. All questions at time of visit were answered - patient  instructed to contact office with any additional concerns. ER/RTC precautions were reviewed with the patient and understanding verbalized.     Please note: voice recognition software was used to produce this document, and typos may escape review. Please contact Dr. Sheppard Coil for any needed clarifications.    Follow up plan: Return if symptoms worsen or fail to improve / depending on labs .

## 2018-04-30 NOTE — Patient Instructions (Signed)
Plan:  I think your symptoms are most likely caused by scar tissue from previous surgery/infections.  This can cause some restriction in the movement of the digestive system which can lead to nausea, bloating, cramping, etc. if you are having severe pain, especially if vomiting, inability to pass stool, or fever, this is an emergency!  Right now, I think we are okay to try some medications for nausea and for bloating, eat small frequent snacks rather than large meals, and we will get some lab work.  If symptoms persist by the end of this week, we may consider a CT scan.  If you are having painful or difficult swallowing, we may need to send to a GI doctor to consider an endoscopy or other testing.   Please call if any questions or problems!

## 2018-05-01 LAB — URINALYSIS, ROUTINE W REFLEX MICROSCOPIC
Bilirubin Urine: NEGATIVE
Glucose, UA: NEGATIVE
Hgb urine dipstick: NEGATIVE
Ketones, ur: NEGATIVE
Leukocytes,Ua: NEGATIVE
Nitrite: NEGATIVE
Protein, ur: NEGATIVE
Specific Gravity, Urine: 1.024 (ref 1.001–1.03)
pH: 6 (ref 5.0–8.0)

## 2018-05-01 LAB — CBC WITH DIFFERENTIAL/PLATELET
Absolute Monocytes: 281 cells/uL (ref 200–950)
Basophils Absolute: 18 cells/uL (ref 0–200)
Basophils Relative: 0.4 %
Eosinophils Absolute: 18 cells/uL (ref 15–500)
Eosinophils Relative: 0.4 %
HCT: 39.2 % (ref 35.0–45.0)
Hemoglobin: 13.2 g/dL (ref 11.7–15.5)
Lymphs Abs: 1702 cells/uL (ref 850–3900)
MCH: 28.7 pg (ref 27.0–33.0)
MCHC: 33.7 g/dL (ref 32.0–36.0)
MCV: 85.2 fL (ref 80.0–100.0)
MPV: 11.5 fL (ref 7.5–12.5)
Monocytes Relative: 6.1 %
Neutro Abs: 2581 cells/uL (ref 1500–7800)
Neutrophils Relative %: 56.1 %
Platelets: 319 10*3/uL (ref 140–400)
RBC: 4.6 10*6/uL (ref 3.80–5.10)
RDW: 12.9 % (ref 11.0–15.0)
Total Lymphocyte: 37 %
WBC: 4.6 10*3/uL (ref 3.8–10.8)

## 2018-05-01 LAB — COMPLETE METABOLIC PANEL WITH GFR
AG Ratio: 1.6 (calc) (ref 1.0–2.5)
ALT: 13 U/L (ref 6–29)
AST: 16 U/L (ref 10–35)
Albumin: 4.2 g/dL (ref 3.6–5.1)
Alkaline phosphatase (APISO): 83 U/L (ref 37–153)
BUN: 14 mg/dL (ref 7–25)
CO2: 26 mmol/L (ref 20–32)
Calcium: 9.4 mg/dL (ref 8.6–10.4)
Chloride: 106 mmol/L (ref 98–110)
Creat: 0.66 mg/dL (ref 0.50–1.05)
GFR, Est African American: 112 mL/min/{1.73_m2} (ref >=60)
GFR, Est Non African American: 97 mL/min/{1.73_m2} (ref >=60)
Globulin: 2.7 g/dL (calc) (ref 1.9–3.7)
Glucose, Bld: 96 mg/dL (ref 65–99)
Potassium: 4.4 mmol/L (ref 3.5–5.3)
Sodium: 139 mmol/L (ref 135–146)
Total Bilirubin: 0.5 mg/dL (ref 0.2–1.2)
Total Protein: 6.9 g/dL (ref 6.1–8.1)

## 2018-05-01 LAB — HEMOGLOBIN A1C
Hgb A1c MFr Bld: 6.2 % of total Hgb — ABNORMAL HIGH (ref <5.7)
Mean Plasma Glucose: 131 (calc)
eAG (mmol/L): 7.3 (calc)

## 2018-05-01 LAB — LIPASE: Lipase: 22 U/L (ref 7–60)

## 2018-05-13 MED FILL — LOSARTAN POTASSIUM 25 MG TA: 25 | 30 days supply | Qty: 30 | Fill #9

## 2018-05-14 MED FILL — ATORVASTATIN 40 MG TABLET: 40 | 90 days supply | Qty: 90 | Fill #8

## 2018-05-14 MED FILL — metFORMIN HCL ER 500 MG TB2: 500 | 90 days supply | Qty: 90 | Fill #6

## 2018-06-18 MED FILL — LOSARTAN POTASSIUM 25 MG TA: 25 | 60 days supply | Qty: 60 | Fill #10

## 2018-07-01 ENCOUNTER — Ambulatory Visit (INDEPENDENT_AMBULATORY_CARE_PROVIDER_SITE_OTHER): Payer: 59 | Admitting: Osteopathic Medicine

## 2018-07-01 ENCOUNTER — Encounter: Payer: Self-pay | Admitting: Osteopathic Medicine

## 2018-07-01 VITALS — BP 131/85 | HR 76 | Temp 97.6°F | Wt 156.2 lb

## 2018-07-01 DIAGNOSIS — Z23 Encounter for immunization: Secondary | ICD-10-CM | POA: Diagnosis not present

## 2018-07-01 DIAGNOSIS — Z0000 Encounter for general adult medical examination without abnormal findings: Secondary | ICD-10-CM

## 2018-07-01 DIAGNOSIS — R7303 Prediabetes: Secondary | ICD-10-CM

## 2018-07-01 DIAGNOSIS — I10 Essential (primary) hypertension: Secondary | ICD-10-CM | POA: Diagnosis not present

## 2018-07-01 DIAGNOSIS — M8580 Other specified disorders of bone density and structure, unspecified site: Secondary | ICD-10-CM

## 2018-07-01 DIAGNOSIS — E7800 Pure hypercholesterolemia, unspecified: Secondary | ICD-10-CM

## 2018-07-01 MED ORDER — LOSARTAN POTASSIUM 25 MG PO TABS: 25.0000 mg | ORAL_TABLET | Freq: Every day | ORAL | 3 refills | Status: DC

## 2018-07-01 MED ORDER — METFORMIN HCL ER 500 MG PO TB24: 500.0000 mg | ORAL_TABLET | Freq: Every day | ORAL | 3 refills | Status: DC

## 2018-07-01 MED ORDER — ATORVASTATIN CALCIUM 40 MG PO TABS: 40.0000 mg | ORAL_TABLET | Freq: Every day | ORAL | 3 refills | Status: DC

## 2018-07-01 MED ORDER — OMEPRAZOLE 40 MG PO CPDR: 40.0000 mg | DELAYED_RELEASE_CAPSULE | Freq: Every day | ORAL | 3 refills | Status: DC

## 2018-07-01 MED FILL — OMEPRAZOLE 40 MG CPDR: 40 | 90 days supply | Qty: 90 | Fill #0

## 2018-07-01 NOTE — Progress Notes (Signed)
HPI: Whitney Neal is a 58 y.o. 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 03/09/2017 (03/09/2017), Acute on chronic cholecystitis s/p lap cholecystectomy 03/09/2017 (03/09/2017), Atypical chest pain, Barrett's esophagus, Cervical lymphadenopathy, Cervical strain, Constipation, Delayed bile leak s/p ERCP/stenting 03/14/2017, Diabetes mellitus without complication ([place]), GERD (gastroesophageal reflux disease), Hepatic abscesses s/p perc drainage 04/04/2017 (04/04/2017), Hypercholesteremia, Hyperglycemia, Hyperplastic colonic polyp, Hypertension, Migraine, Osteopenia, PONV (postoperative nausea and vomiting), Ulcer (2010), Urinary incontinence, and Vitamin D deficiency.  she presents to [facility] today, 07/01/18,  for chief complaint of: Annual physical     Patient here for annual physical / wellness exam.  See preventive care reviewed as below.  Recent labs reviewed in detail with the patient.   Additional concerns today include:   BP today elevated on intake 146/97, recheck was better at 131/85 BP Readings from Last 3 Encounters:  07/01/18 131/85  04/30/18 (!) 142/71  12/30/17 139/79        Past medical, surgical, social and family history reviewed:  Patient Active Problem List   Diagnosis Date Noted  . Prediabetes 07/01/2018  . Need for shingles vaccine 12/30/2017  . Hepatic abscesses s/p perc drainage 04/04/2017 04/04/2017  . Nausea & vomiting 04/02/2017  . Delayed bile leak s/p ERCP/stenting 03/14/2017   . Acute on chronic cholecystitis s/p lap cholecystectomy 03/09/2017 03/09/2017  . 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 Constipation   . Hemangioma of liver 02/04/2017  . Migraine 10/01/2011  . Numbness and tingling in left hand 01/05/2011  . Hyperglycemia   . Essential hypertension 01/25/2010  . GERD 01/25/2010  . COLONIC POLYPS, HYPERPLASTIC,  HX OF 01/25/2010  . CERVICAL STRAIN 11/23/2009  . Hyperlipidemia 09/26/2009  . Osteopenia 07/11/2009  . GASTRIC ULCER, HX OF 07/11/2009  . History of Barrett's esophagus 07/11/2009  . Vitamin D deficiency 07/04/2009    Past Surgical History:  Procedure Laterality Date  . ABDOMINAL HYSTERECTOMY  2009   Dr. Bethann Goo - benign disease  . BLADDER SURGERY     bladder mesh surgery 2010- Dr. Rhodia Albright  . BREAST CYST ASPIRATION    . COLONOSCOPY W/ POLYPECTOMY  2010  . ENDOSCOPIC RETROGRADE CHOLANGIOPANCREATOGRAPHY (ERCP) WITH PROPOFOL N/A 05/03/2017   Procedure: ENDOSCOPIC RETROGRADE CHOLANGIOPANCREATOGRAPHY (ERCP) WITH PROPOFOL;  Surgeon: Carol Ada, MD;  Location: [facility];  Service: Endoscopy;  Laterality: N/A;  . ERCP N/A 03/13/2017   Procedure: ENDOSCOPIC RETROGRADE CHOLANGIOPANCREATOGRAPHY (ERCP);  Surgeon: Carol Ada, MD;  Location: [facility];  Service: Endoscopy;  Laterality: N/A;  . ERCP N/A 03/14/2017   Procedure: ENDOSCOPIC RETROGRADE CHOLANGIOPANCREATOGRAPHY (ERCP);  Surgeon: Ladene Artist, MD;  Location: [facility];  Service: Endoscopy;  Laterality: N/A;  . IR RADIOLOGIST EVAL & MGMT  04/16/2017  . LAPAROSCOPIC CHOLECYSTECTOMY SINGLE PORT N/A 03/09/2017   Procedure: LAPAROSCOPIC CHOLECYSTECTOMY WITH INTRAOPERATIVE CHOLANGIOGRAM, CLOSURE OF ABERRANT DUCT OF LUSCHKA;  Surgeon: Michael Boston, MD;  Location: [facility];  Service: General;  Laterality: N/A;    Social History   Tobacco Use  . Smoking status: Never Smoker  . Smokeless tobacco: Never Used  Substance Use Topics  . Alcohol use: No    Family History  Problem Relation Age of Onset  . Colon cancer Father   . Hypertension Father   . Heart attack Father 27  . Cancer Father        prostate CA  . Pulmonary embolism Mother  deceased  . Hypertension Mother   . Diabetes Sister   . Hypertension Brother   . Hypertension Sister   . Cancer Paternal Grandmother        oral  . Thyroid disease Sister        1/2  sister thyroid problem  . Healthy Daughter   . Heart attack Brother 83  . Breast cancer Cousin      Current medication list and allergy/intolerance information reviewed:    Current Outpatient Medications  Medication Sig Dispense Refill  . aspirin 81 MG EC tablet Take 81 mg by mouth daily.      Marland Kitchen atorvastatin (LIPITOR) 40 MG tablet Take 1 tablet (40 mg total) by mouth daily. 90 tablet 3  . dicyclomine (BENTYL) 20 MG tablet Take 1 tablet (20 mg total) by mouth 4 (four) times daily as needed for spasms. 60 tablet 1  . fluticasone (FLONASE) 50 MCG/ACT nasal spray Place 1 spray into both nostrils daily as needed for allergies or rhinitis.    Marland Kitchen losartan (COZAAR) 25 MG tablet Take 1 tablet (25 mg total) by mouth daily. 90 tablet 3  . metFORMIN (GLUCOPHAGE XR) 500 MG 24 hr tablet Take 1 tablet (500 mg total) by mouth daily with breakfast. 90 tablet 3  . Multiple Vitamins-Minerals (MULTIVITAMIN PO) Take 1 tablet by mouth daily.    Marland Kitchen omeprazole (PRILOSEC) 40 MG capsule Take 1 capsule (40 mg total) by mouth daily. 90 capsule 3  . ondansetron (ZOFRAN-ODT) 8 MG disintegrating tablet Take 1 tablet (8 mg total) by mouth every 8 (eight) hours as needed for nausea. 20 tablet 3   No current facility-administered medications for this visit.     Allergies  Allergen Reactions  . Codeine Other (See Comments)    REACTION: headache, visual disturbance  . Morphine Other (See Comments)    Unknown  . Morphine And Related Other (See Comments)    Unknown  . Other Rash    Ceftriaxone Sodium In Dextrose  . Rocephin [Ceftriaxone Sodium In Dextrose] Rash      Review of Systems:  Constitutional:  No  fever, no chills, No recent illness, No unintentional weight changes. No significant fatigue.   HEENT: No  headache, no vision change, no hearing change, No sore throat, No  sinus pressure  Cardiac: No  chest pain, No  pressure, No palpitations, No  Orthopnea  Respiratory:  No  shortness of breath. No   Cough  Gastrointestinal: No  abdominal pain, No  nausea, No  vomiting,  No  blood in stool, No  diarrhea, No  constipation   Musculoskeletal: No new myalgia/arthralgia  Skin: No  Rash, No other wounds/concerning lesions  Genitourinary: No  incontinence, No  abnormal genital bleeding, No abnormal genital discharge  Hem/Onc: No  easy bruising/bleeding, No  abnormal lymph node  Endocrine: No cold intolerance,  No heat intolerance. No polyuria/polydipsia/polyphagia   Neurologic: No  weakness, No  dizziness, No  slurred speech/focal weakness/facial droop  Psychiatric: No  concerns with depression, No  concerns with anxiety, No sleep problems, No mood problems  Exam:  BP 131/85 (BP Location: Left Arm, Patient Position: Sitting, Cuff Size: Normal)   Pulse 76   Temp 97.6 F (36.4 C) (Oral)   Wt 156 lb 3.2 oz (70.9 kg)   BMI 29.51 kg/m   Constitutional: VS see above. General Appearance: alert, well-developed, well-nourished, NAD  Eyes: Normal lids and conjunctive, non-icteric sclera  Ears, Nose, Mouth, Throat: MMM, Normal external inspection ears/nares/mouth/lips/gums. TM  normal bilaterally. Pharynx/tonsils no erythema, no exudate. Nasal mucosa normal.   Neck: No masses, trachea midline. No thyroid enlargement. No tenderness/mass appreciated. No lymphadenopathy  Respiratory: Normal respiratory effort. no wheeze, no rhonchi, no rales  Cardiovascular: S1/S2 normal, no murmur, no rub/gallop auscultated. RRR. No lower extremity edema. Pedal pulse II/IV bilaterally DP and PT. No carotid bruit or JVD. No abdominal aortic bruit.  Gastrointestinal: Nontender, no masses. No hepatomegaly, no splenomegaly. No hernia appreciated. Bowel sounds normal. Rectal exam deferred.   Musculoskeletal: Gait normal. No clubbing/cyanosis of digits.   Neurological: Normal balance/coordination. No tremor. No cranial nerve deficit on limited exam. Motor and sensation intact and symmetric. Cerebellar reflexes  intact.   Skin: warm, dry, intact. No rash/ulcer. No concerning nevi or subq nodules on limited exam.    Psychiatric: Normal judgment/insight. Normal mood and affect. Oriented x3.    No results found for this or any previous visit (from the past 72 hour(s)).  No results found.   ASSESSMENT/PLAN: The primary encounter diagnosis was Annual physical exam. Diagnoses of Prediabetes, Osteopenia, unspecified location, High cholesterol, Essential hypertension, and Need for Tdap vaccination were also pertinent to this visit.   Orders Placed This Encounter  Procedures  . Tdap vaccine greater than or equal to 7yo IM    Meds ordered this encounter  Medications  . atorvastatin (LIPITOR) 40 MG tablet    Sig: Take 1 tablet (40 mg total) by mouth daily.    Dispense:  90 tablet    Refill:  3  . losartan (COZAAR) 25 MG tablet    Sig: Take 1 tablet (25 mg total) by mouth daily.    Dispense:  90 tablet    Refill:  3  . metFORMIN (GLUCOPHAGE XR) 500 MG 24 hr tablet    Sig: Take 1 tablet (500 mg total) by mouth daily with breakfast.    Dispense:  90 tablet    Refill:  3  . omeprazole (PRILOSEC) 40 MG capsule    Sig: Take 1 capsule (40 mg total) by mouth daily.    Dispense:  90 capsule    Refill:  3    Patient Instructions  General Preventive Care  Most recent routine screening lipids/other labs: all looks good!   Tobacco: don't!  Alcohol:: don't!   Exercise: as tolerated to reduce risk of cardiovascular disease and diabetes. Strength training will also prevent osteoporosis.   Mental health: if need for mental health care (medicines, counseling, other), or concerns about moods, please let me know!   Sexual health: if need for STD testing, or if concerns with libido/pain problems, please let me know!  Advanced Directive: Living Will and/or Healthcare Power of Attorney recommended for all adults, regardless of age or health.  Vaccines  Flu vaccine: recommended for almost everyone,  every fall.   Shingles vaccine: Shingrix recommended after age 97. Will make sure you're on the list!   Pneumonia vaccines: Prevnar and Pneumovax recommended after age 47, or sooner if certain medical conditions.  Tetanus booster: Tdap recommended every 10 years. Updated today.  Cancer screenings   Colon cancer screening: as directed by GI, will refer to Dr Bryan Lemma.   Breast cancer screening: mammogram annually after age 75, due 12/2018  Cervical cancer screening: Can stop Pap w/ hysterectomy.   Lung cancer screening: not needed for non-smokers  Infection screenings . HIV: recommended screening at least once age 60-65, more often as needed. . Gonorrhea/Chlamydia: screening as needed. . Hepatitis C: recommended for anyone born 61-1967 .  TB: certain at-risk populations, or depending on work requirements and/or travel history Other . Bone Density Test: recommended for women at age 46. Can get this w/ mammogram.         Visit summary with medication list and pertinent instructions was printed for patient to review. All questions at time of visit were answered - patient instructed to contact office with any additional concerns or updates. ER/RTC precautions were reviewed with the patient.      Please note: voice recognition software was used to produce this document, and typos may escape review. Please contact Dr. Sheppard Coil for any needed clarifications.     Follow-up plan: Return in about 6 months (around 01/01/2019) for follow up to monitor blood pressure, sugars, mammogram, bone density .

## 2018-07-01 NOTE — Patient Instructions (Addendum)
General Preventive Care  Most recent routine screening lipids/other labs: all looks good!   Tobacco: don't!  Alcohol:: don't!   Exercise: as tolerated to reduce risk of cardiovascular disease and diabetes. Strength training will also prevent osteoporosis.   Mental health: if need for mental health care (medicines, counseling, other), or concerns about moods, please let me know!   Sexual health: if need for STD testing, or if concerns with libido/pain problems, please let me know!  Advanced Directive: Living Will and/or Healthcare Power of Attorney recommended for all adults, regardless of age or health.  Vaccines  Flu vaccine: recommended for almost everyone, every fall.   Shingles vaccine: Shingrix recommended after age 35. Will make sure you're on the list!   Pneumonia vaccines: Prevnar and Pneumovax recommended after age 43, or sooner if certain medical conditions.  Tetanus booster: Tdap recommended every 10 years. Updated today.  Cancer screenings   Colon cancer screening: as directed by GI, will refer to Dr Bryan Lemma.   Breast cancer screening: mammogram annually after age 39, due 12/2018  Cervical cancer screening: Can stop Pap w/ hysterectomy.   Lung cancer screening: not needed for non-smokers  Infection screenings . HIV: recommended screening at least once age 41-65, more often as needed. . Gonorrhea/Chlamydia: screening as needed. . Hepatitis C: recommended for anyone born 36-1967 . TB: certain at-risk populations, or depending on work requirements and/or travel history Other . Bone Density Test: recommended for women at age 68. Can get this w/ mammogram.

## 2018-08-19 IMAGING — US US ABDOMEN LIMITED
1 series · 14 of 25 positions shown · non-contrast
Comparison: None.

CLINICAL DATA: Chronic abdominal pain for 3 years

EXAM:
ULTRASOUND ABDOMEN LIMITED RIGHT UPPER QUADRANT

[Series 1: us abdomen limited · 0.20mm/px · 14 of 64 slices shown]
[im 1/64]
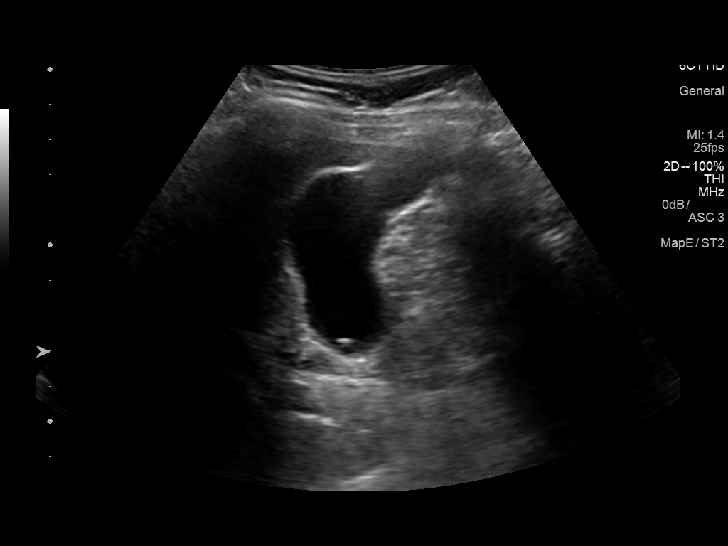
[im 6/64]
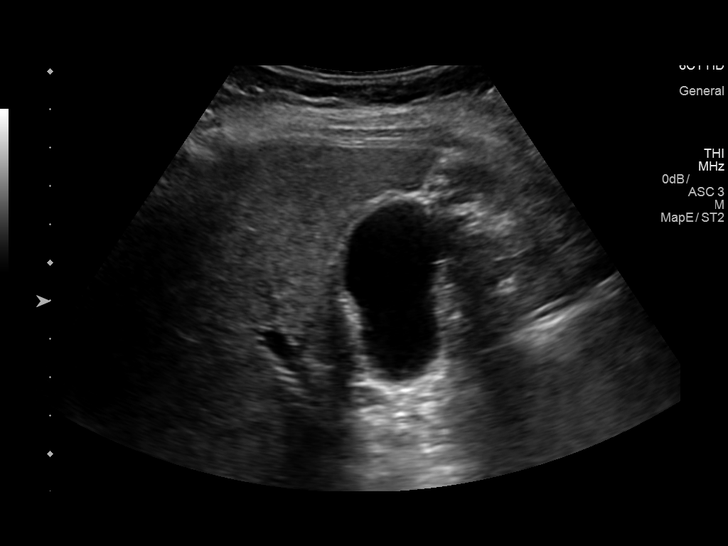
[im 11/64]
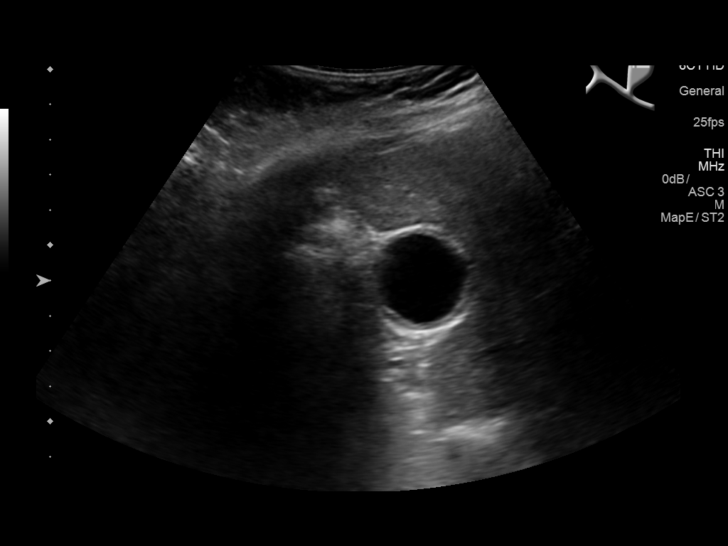
[im 16/64]
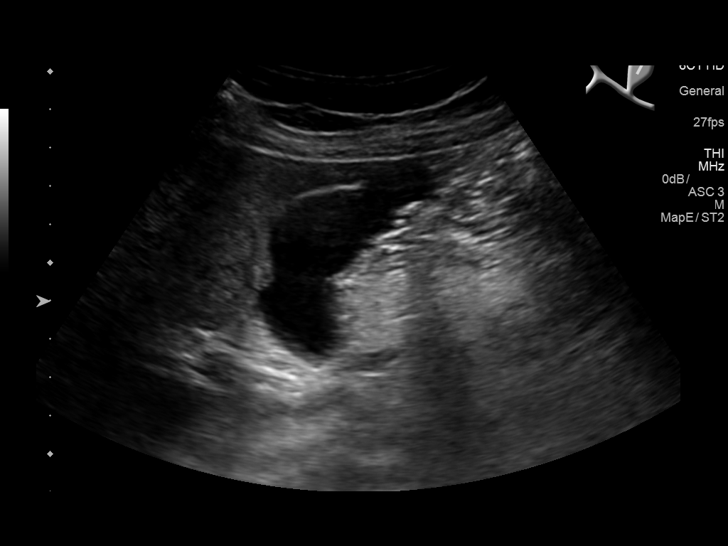
[im 22/64]
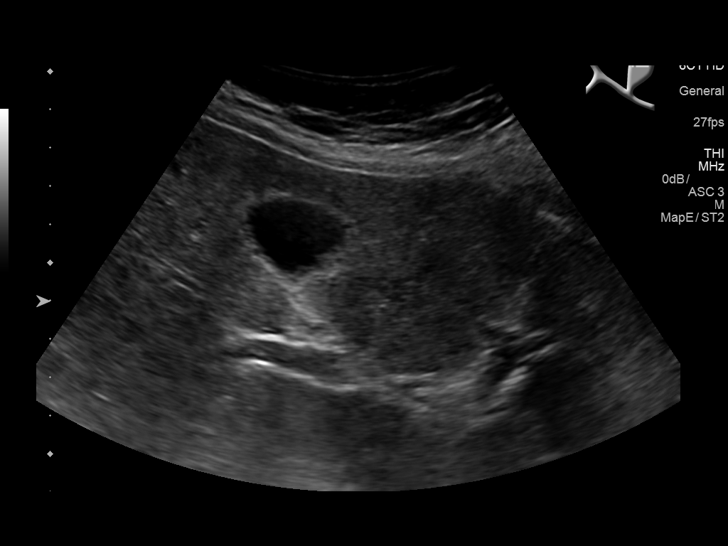
[im 24/64]
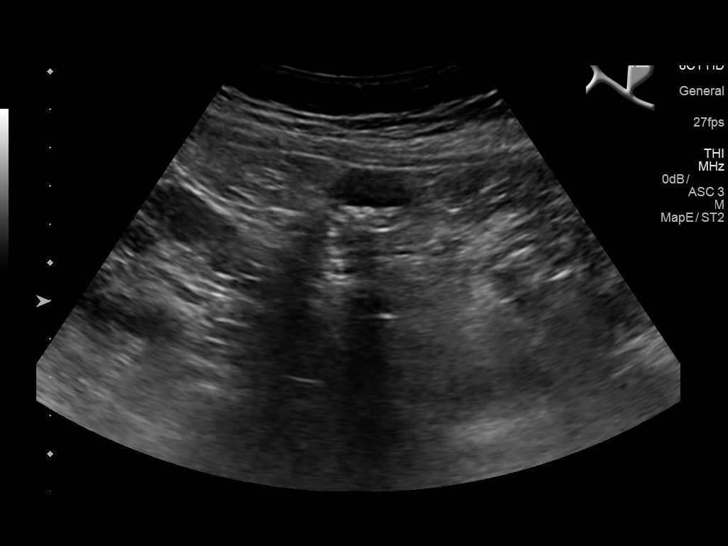
[im 29/64]
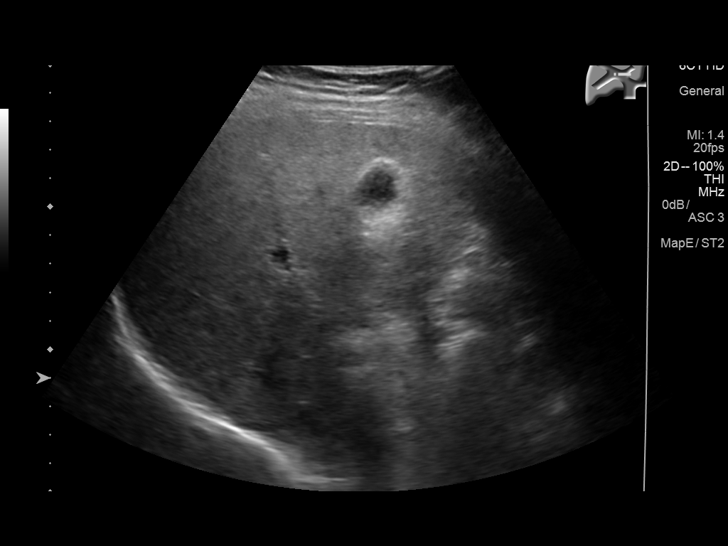
[im 35/64]
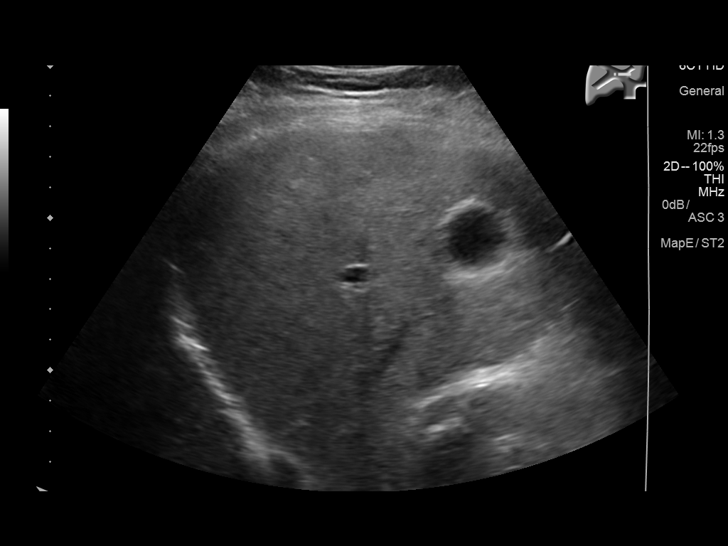
[im 40/64]
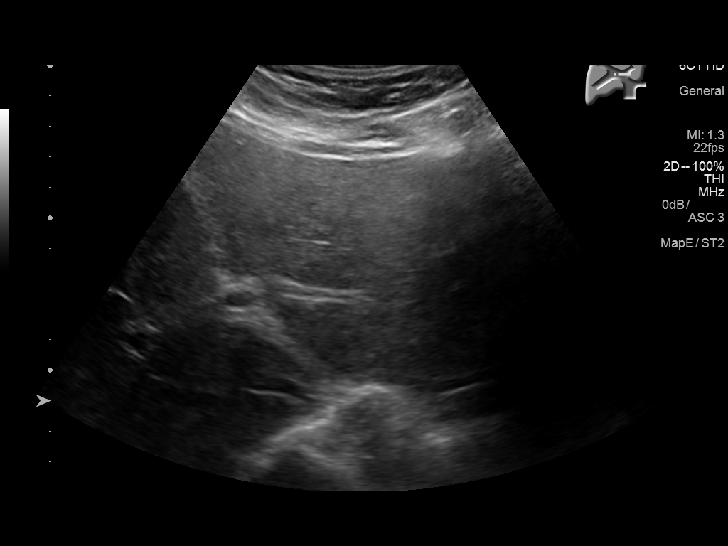
[im 43/64]
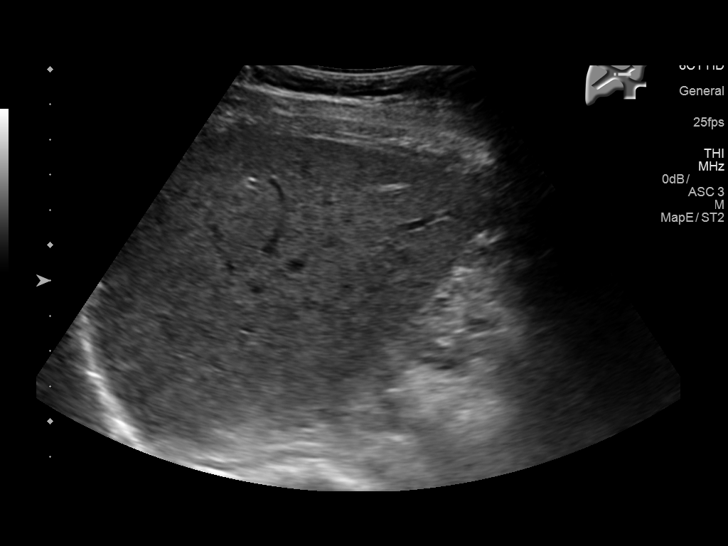
[im 48/64]
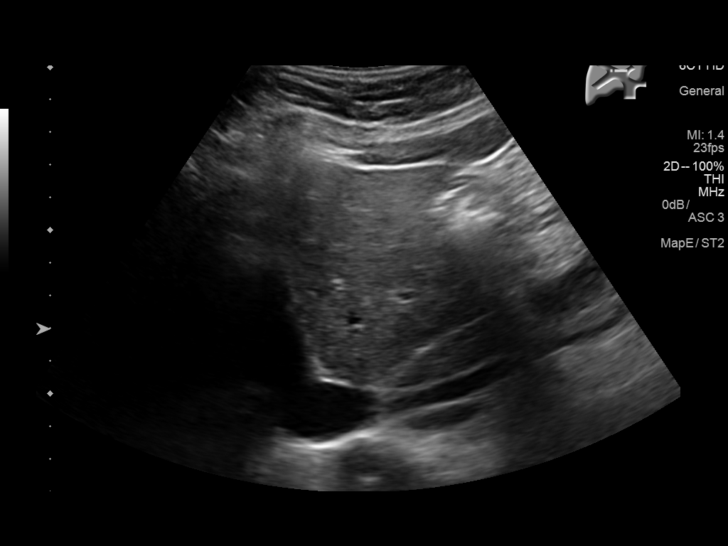
[im 53/64]
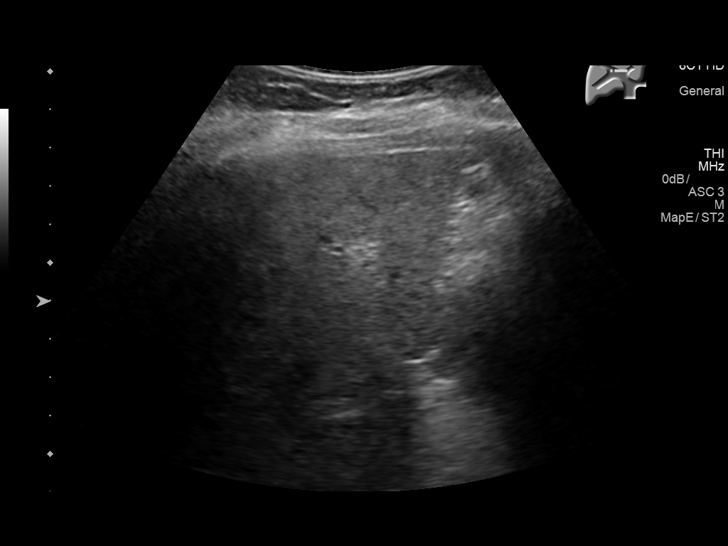
[im 58/64]
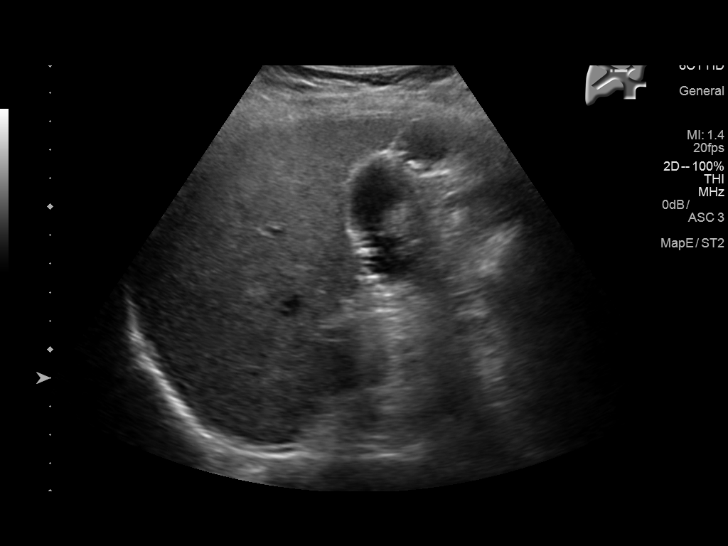
[im 64/64]
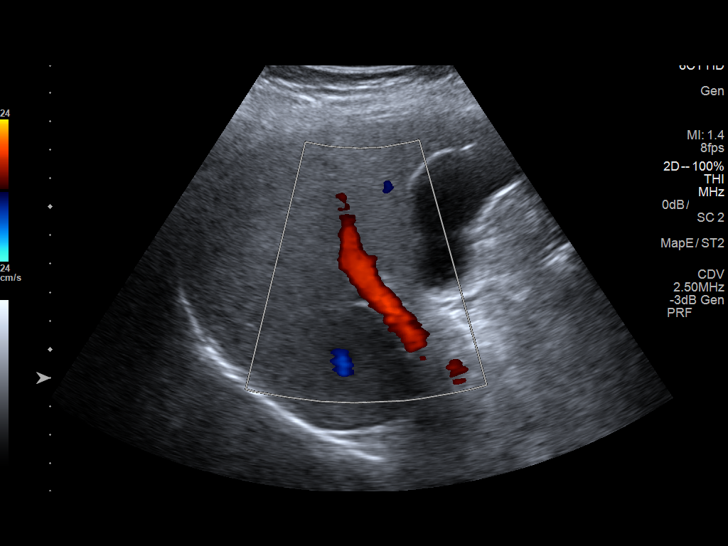

[14 of 25 positions shown; findings below may reference images not displayed]

FINDINGS: Gallbladder:

Small echogenic shadowing gallstones noted, largest measures 7 mm.
No associated sonographic Murphy's sign. No wall thickening. Wall
thickness measures 2.2 mm. No signs of cholecystitis.

Common bile duct:

Diameter: 4.3 mm

Liver:

Normal echogenicity. Central right liver triangular hyperechoic area
measures 10 x 7 x 8 mm remains indeterminate by ultrasound. This may
represent a small hemangioma.

No surrounding ascites. Portal vein is patent on color Doppler
imaging with normal direction of blood flow towards the liver.
IMPRESSION: Cholelithiasis.  No other acute finding.

10 mm hyperechoic central right hepatic lesion, indeterminate but
suspect small hemangioma. Recommend follow-up ultrasound in 6
months.

## 2018-08-20 MED FILL — ATORVASTATIN 40 MG TABLET: 40 | 90 days supply | Qty: 90 | Fill #0

## 2018-08-20 MED FILL — LOSARTAN POTASSIUM 25 MG TA: 25 | 90 days supply | Qty: 90 | Fill #0

## 2018-08-20 MED FILL — metFORMIN HCL ER 500 MG TB2: 500 | 90 days supply | Qty: 90 | Fill #0

## 2018-11-17 MED FILL — metFORMIN HCL ER 500 MG TB2: 500 | 90 days supply | Qty: 90 | Fill #1

## 2018-11-17 MED FILL — ATORVASTATIN 40 MG TABLET: 40 | 90 days supply | Qty: 90 | Fill #1

## 2018-11-17 MED FILL — LOSARTAN POTASSIUM 25 MG TA: 25 | 90 days supply | Qty: 90 | Fill #1

## 2018-12-03 ENCOUNTER — Other Ambulatory Visit: Payer: Self-pay | Admitting: Osteopathic Medicine

## 2018-12-03 DIAGNOSIS — Z1231 Encounter for screening mammogram for malignant neoplasm of breast: Secondary | ICD-10-CM

## 2018-12-04 MED FILL — OMEPRAZOLE 40 MG CPDR: 40 | 90 days supply | Qty: 90 | Fill #1

## 2018-12-08 ENCOUNTER — Encounter: Payer: Self-pay | Admitting: Physician Assistant

## 2018-12-08 ENCOUNTER — Ambulatory Visit (INDEPENDENT_AMBULATORY_CARE_PROVIDER_SITE_OTHER): Payer: 59 | Admitting: Physician Assistant

## 2018-12-08 ENCOUNTER — Other Ambulatory Visit: Payer: Self-pay

## 2018-12-08 VITALS — BP 140/82 | HR 90 | Wt 156.0 lb

## 2018-12-08 DIAGNOSIS — R21 Rash and other nonspecific skin eruption: Secondary | ICD-10-CM

## 2018-12-08 DIAGNOSIS — Z23 Encounter for immunization: Secondary | ICD-10-CM | POA: Diagnosis not present

## 2018-12-08 NOTE — Progress Notes (Signed)
Subjective:    Patient ID: Whitney Neal, female    DOB: 1960-03-31, 58 y.o.   MRN: [id number]  HPI  Pt is a 58 yo female who presents to the clinic with rash. Pt is concerned because she got the flu shot and 2 weeks later she has some tiny red bumps come up on there right wrist that were itchy. She then noticed some red bumps on her abdomen. Never any pain or oozing. She works in a cancer unit and concerned about shingles. She has been putting some topical steroid on them and seem to be drying up. Overall much better. She wonders if flu shot could have lowered her immune system. She denies any travel, fever, chills, URI symptoms. Her husband does laundry but he does change the detergent from time to time. No new medications or lotions.   .. Active Ambulatory Problems    Diagnosis Date Noted  . Vitamin D deficiency 07/04/2009  . Hyperlipidemia 09/26/2009  . Essential hypertension 01/25/2010  . GERD 01/25/2010  . Osteopenia 07/11/2009  . CERVICAL STRAIN 11/23/2009  . GASTRIC ULCER, HX OF 07/11/2009  . COLONIC POLYPS, HYPERPLASTIC, HX OF 01/25/2010  . History of Barrett's esophagus 07/11/2009  . Hyperglycemia   . Numbness and tingling in left hand 01/05/2011  . Migraine 10/01/2011  . Hemangioma of liver 02/04/2017  . Constipation   . Acute on chronic cholecystitis s/p lap cholecystectomy 03/09/2017 03/09/2017  . 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 Delayed bile leak s/p ERCP/stenting 03/14/2017   . Nausea & vomiting 04/02/2017  . Hepatic abscesses s/p perc drainage 04/04/2017 04/04/2017  . Need for shingles vaccine 12/30/2017  . Prediabetes 07/01/2018   Resolved Ambulatory Problems    Diagnosis Date Noted  . CONSTIPATION 01/25/2010  . EPIGASTRIC PAIN 01/02/2010  . CHEST PAIN, ATYPICAL, HX OF 07/04/2009  . Sore throat 07/24/2011  . Atypical chest pain 07/24/2011  . Paresthesia 11/12/2011  . Myalgia 06/30/2012  . Chest pain 06/15/2013   . Elevated fasting glucose 05/23/2016  . Cardiac risk counseling 05/23/2016   Past Medical History:  Diagnosis Date  . Barrett's esophagus   . Cervical lymphadenopathy   . Cervical strain   . Constipation   . Diabetes mellitus without complication ([place])   . GERD (gastroesophageal reflux disease)   . Hypercholesteremia   . Hyperplastic colonic polyp   . Hypertension   . PONV (postoperative nausea and vomiting)   . Ulcer 2010  . Urinary incontinence   . Vitamin D deficiency      Review of Systems See HPI.     Objective:   Physical Exam Vitals signs reviewed.  Constitutional:      Appearance: Normal appearance.  HENT:     Head: Normocephalic.     Nose: Nose normal. No congestion or rhinorrhea.     Mouth/Throat:     Mouth: Mucous membranes are moist.     Pharynx: Oropharynx is clear.  Neck:     Musculoskeletal: Normal range of motion and neck supple.  Cardiovascular:     Rate and Rhythm: Normal rate and regular rhythm.     Pulses: Normal pulses.     Heart sounds: Normal heart sounds.  Pulmonary:     Effort: Pulmonary effort is normal.     Breath sounds: Normal breath sounds.  Skin:    Comments: 3-4 tiny flesh colored papules on right anterior wrist/forearm.   4-5 erythematous tiny papules on abdomen.  Neurological:     General: No focal deficit present.     Mental Status: She is alert and oriented to person, place, and time.  Psychiatric:        Mood and Affect: Mood normal.           Assessment & Plan:  .Marland KitchenNiya was seen today for rash.  Diagnoses and all orders for this visit:  Rash  Need for shingles vaccine -     Varicella-zoster vaccine IM   Reassured this was not shingles. Since she is concerned and does not have shingles vaccine I started series today. Follow up in 2 months for next dose.   Rash looks like some contact dermatitis. Continue using topical steroid. Look for detergent change or anything coming in contact with skin. Follow up  as needed.

## 2018-12-08 NOTE — Patient Instructions (Signed)
Contact Dermatitis Dermatitis is redness, soreness, and swelling (inflammation) of the skin. Contact dermatitis is a reaction to something that touches the skin. There are two types of contact dermatitis:  Irritant contact dermatitis. This happens when something bothers (irritates) your skin, like soap.  Allergic contact dermatitis. This is caused when you are exposed to something that you are allergic to, such as poison ivy. What are the causes?  Common causes of irritant contact dermatitis include: ? Makeup. ? Soaps. ? Detergents. ? Bleaches. ? Acids. ? Metals, such as nickel.  Common causes of allergic contact dermatitis include: ? Plants. ? Chemicals. ? Jewelry. ? Latex. ? Medicines. ? Preservatives in products, such as clothing. What increases the risk?  Having a job that exposes you to things that bother your skin.  Having asthma or eczema. What are the signs or symptoms? Symptoms may happen anywhere the irritant has touched your skin. Symptoms include:  Dry or flaky skin.  Redness.  Cracks.  Itching.  Pain or a burning feeling.  Blisters.  Blood or clear fluid draining from skin cracks. With allergic contact dermatitis, swelling may occur. This may happen in places such as the eyelids, mouth, or genitals. How is this treated?  This condition is treated by checking for the cause of the reaction and protecting your skin. Treatment may also include: ? Steroid creams, ointments, or medicines. ? Antibiotic medicines or other ointments, if you have a skin infection. ? Lotion or medicines to help with itching. ? A bandage (dressing). Follow these instructions at home: Skin care  Moisturize your skin as needed.  Put cool cloths on your skin.  Put a baking soda paste on your skin. Stir water into baking soda until it looks like a paste.  Do not scratch your skin.  Avoid having things rub up against your skin.  Avoid the use of soaps, perfumes, and dyes.  Medicines  Take or apply over-the-counter and prescription medicines only as told by your doctor.  If you were prescribed an antibiotic medicine, take or apply it as told by your doctor. Do not stop using it even if your condition starts to get better. Bathing  Take a bath with: ? Epsom salts. ? Baking soda. ? Colloidal oatmeal.  Bathe less often.  Bathe in warm water. Avoid using hot water. Bandage care  If you were given a bandage, change it as told by your health care provider.  Wash your hands with soap and water before and after you change your bandage. If soap and water are not available, use hand sanitizer. General instructions  Avoid the things that caused your reaction. If you do not know what caused it, keep a journal. Write down: ? What you eat. ? What skin products you use. ? What you drink. ? What you wear in the area that has symptoms. This includes jewelry.  Check the affected areas every day for signs of infection. Check for: ? More redness, swelling, or pain. ? More fluid or blood. ? Warmth. ? Pus or a bad smell.  Keep all follow-up visits as told by your doctor. This is important. Contact a doctor if:  You do not get better with treatment.  Your condition gets worse.  You have signs of infection, such as: ? More swelling. ? Tenderness. ? More redness. ? Soreness. ? Warmth.  You have a fever.  You have new symptoms. Get help right away if:  You have a very bad headache.  You have neck pain.    Your neck is stiff.  You throw up (vomit).  You feel very sleepy.  You see red streaks coming from the area.  Your bone or joint near the area hurts after the skin has healed.  The area turns darker.  You have trouble breathing. Summary  Dermatitis is redness, soreness, and swelling of the skin.  Symptoms may occur where the irritant has touched you.  Treatment may include medicines and skin care.  If you do not know what caused your  reaction, keep a journal.  Contact a doctor if your condition gets worse or you have signs of infection. This information is not intended to replace advice given to you by your health care provider. Make sure you discuss any questions you have with your health care provider. Document Released: 12/03/2008 Document Revised: 05/28/2018 Document Reviewed: 08/21/2017 Elsevier Patient Education  2020 [employer]  

## 2018-12-09 ENCOUNTER — Encounter: Payer: Self-pay | Admitting: Physician Assistant

## 2018-12-29 ENCOUNTER — Encounter: Payer: Self-pay | Admitting: Osteopathic Medicine

## 2018-12-29 ENCOUNTER — Other Ambulatory Visit: Payer: Self-pay

## 2018-12-29 ENCOUNTER — Ambulatory Visit (INDEPENDENT_AMBULATORY_CARE_PROVIDER_SITE_OTHER): Payer: PRIVATE HEALTH INSURANCE | Admitting: Osteopathic Medicine

## 2018-12-29 VITALS — BP 132/92 | HR 87 | Temp 98.3°F | Wt 158.1 lb

## 2018-12-29 DIAGNOSIS — Z0000 Encounter for general adult medical examination without abnormal findings: Secondary | ICD-10-CM | POA: Diagnosis not present

## 2018-12-29 DIAGNOSIS — R7303 Prediabetes: Secondary | ICD-10-CM | POA: Diagnosis not present

## 2018-12-29 DIAGNOSIS — Z1231 Encounter for screening mammogram for malignant neoplasm of breast: Secondary | ICD-10-CM | POA: Diagnosis not present

## 2018-12-29 LAB — POCT GLYCOSYLATED HEMOGLOBIN (HGB A1C): Hemoglobin A1C: 6 % — AB (ref 4.0–5.6)

## 2018-12-29 NOTE — Patient Instructions (Signed)
Keep track of your BP at home Goal BP 130/80 or less We might need to increase the Losartan from 25 mg to 50 mg

## 2018-12-29 NOTE — Progress Notes (Signed)
HPI: Whitney Neal is a 58 y.o. 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  she presents to [facility] today, 12/29/18,  for chief complaint of:  Follow-up Pre-DM, HTN   Prediabetes: Doing well on current medications.  Reports no hypoglycemic symptoms, compliance with medications and with diet/exercise, though has been exercising may be a bit less.  Hypertension: Compliant with medications.  No chest pain, pressure, shortness of breath, headache, dizziness.  Has not been checking her blood pressures at home.  BP is a little borderline today    At today's visit 12/29/18 ... PMH, PSH, FH reviewed and updated as needed.  Current medication list and allergy/intolerance hx reviewed and updated as needed. (See remainder of HPI, ROS, Phys Exam below)   No results found.  Results for orders placed or performed in visit on 12/29/18 (from the past 72 hour(s))  POCT HgB A1C     Status: Abnormal   Collection Time: 12/29/18 11:02 AM  Result Value Ref Range   Hemoglobin A1C 6.0 (A) 4.0 - 5.6 %   HbA1c POC (<> result, manual entry)     HbA1c, POC (prediabetic range)     HbA1c, POC (controlled diabetic range)            ASSESSMENT/PLAN: The primary encounter diagnosis was Prediabetes. Diagnoses of Annual physical exam  and Visit for screening mammogram were also pertinent to this visit.   Labs ordered for future visit. Annual physical / preventive care was NOT performed or billed today.    Orders Placed This Encounter  Procedures  . CBC  . COMPLETE METABOLIC PANEL WITH GFR  . LIPID SCREENING  . A1C  . POCT HgB A1C     No orders of the defined types were placed in this encounter.   Patient Instructions  Keep track of your BP at home Goal BP 130/80 or less We might need to increase the Losartan from 25 mg to 50 mg       Follow-up plan: Return in about 6 months (around 06/28/2019) for [place] (get labs prior to visit, orders are in).                                                 ################################################# ################################################# ################################################# #################################################    Current Meds  Medication Sig  . aspirin 81 MG EC tablet Take 81 mg by mouth daily.    Marland Kitchen atorvastatin (LIPITOR) 40 MG tablet Take 1 tablet (40 mg total) by mouth daily.  Marland Kitchen dicyclomine (BENTYL) 20 MG tablet Take 1 tablet (20 mg total) by mouth 4 (four) times daily as needed for spasms.  . fluticasone (FLONASE) 50 MCG/ACT nasal spray Place 1 spray into both nostrils daily as needed for allergies or rhinitis.  Marland Kitchen losartan (COZAAR) 25 MG tablet Take 1 tablet (25 mg total) by mouth daily.  . metFORMIN (GLUCOPHAGE XR) 500 MG 24 hr  tablet Take 1 tablet (500 mg total) by mouth daily with breakfast.  . Multiple Vitamins-Minerals (MULTIVITAMIN PO) Take 1 tablet by mouth daily.  Marland Kitchen omeprazole (PRILOSEC) 40 MG capsule Take 1 capsule (40 mg total) by mouth daily.  . ondansetron (ZOFRAN-ODT) 8 MG disintegrating tablet Take 1 tablet (8 mg total) by mouth every 8 (eight) hours as needed for nausea.    Allergies  Allergen Reactions  . Codeine Other (See Comments)    REACTION:  headache, visual disturbance  . Morphine Other (See Comments)    Unknown  . Morphine And Related Other (See Comments)    Unknown  . Other Rash    Ceftriaxone Sodium In Dextrose  . Rocephin [Ceftriaxone Sodium In Dextrose] Rash       Review of Systems:  Constitutional: No recent illness  HEENT: No  headache, no vision change  Cardiac: No  chest pain, No  pressure, No palpitations  Respiratory:  No  shortness of breath. No  Cough  Musculoskeletal: No new myalgia/arthralgia  Neurologic: No  weakness, No  Dizziness   Exam:  BP (!) 132/92 (BP Location: Left Arm, Patient Position: Sitting, Cuff Size: Normal)   Pulse 87   Temp 98.3 F (36.8 C) (Oral)   Wt 158 lb 1.9 oz (71.7 kg)   BMI 29.88 kg/m   Constitutional: VS see above. General Appearance: alert, well-developed, well-nourished, NAD  Neck: No masses, trachea midline.   Respiratory: Normal respiratory effort. no wheeze, no rhonchi, no rales  Cardiovascular: S1/S2 normal, no murmur, no rub/gallop auscultated. RRR.   Musculoskeletal: Gait normal. Symmetric and independent movement of all extremities  Neurological: Normal balance/coordination. No tremor.  Skin: warm, dry, intact.   Psychiatric: Normal judgment/insight. Normal mood and affect. Oriented x3.       Visit summary with medication list and pertinent instructions was printed for patient to review, patient was advised to alert [place] if any updates are needed. All questions at time of visit were answered - patient instructed to contact office with any additional concerns. ER/RTC precautions were reviewed with the patient and understanding verbalized.   Note: Total time spent 25 minutes, greater than 50% of the visit was spent face-to-face counseling and coordinating care for the following: The primary encounter diagnosis was Prediabetes. Diagnoses of and Visit for screening mammogram were also pertinent to this visit.Marland Kitchen  Please note: voice recognition  software was used to produce this document, and typos may escape review. Please contact Dr. Sheppard Coil for any needed clarifications.    Follow up plan: Return in about 6 months (around 06/28/2019) for [place] (get labs prior to visit, orders are in).

## 2019-01-01 ENCOUNTER — Ambulatory Visit: Payer: 59 | Admitting: Osteopathic Medicine

## 2019-01-08 ENCOUNTER — Ambulatory Visit (INDEPENDENT_AMBULATORY_CARE_PROVIDER_SITE_OTHER): Payer: BLUE CROSS/BLUE SHIELD

## 2019-01-08 ENCOUNTER — Other Ambulatory Visit: Payer: Self-pay

## 2019-01-08 DIAGNOSIS — Z1231 Encounter for screening mammogram for malignant neoplasm of breast: Secondary | ICD-10-CM | POA: Diagnosis not present

## 2019-02-17 MED FILL — ATORVASTATIN 40 MG TABLET: 40 | 90 days supply | Qty: 90 | Fill #2

## 2019-02-17 MED FILL — metFORMIN HCL ER 500 MG TB2: 500 | 90 days supply | Qty: 90 | Fill #2

## 2019-02-17 MED FILL — LOSARTAN POTASSIUM 25 MG TA: 25 | 90 days supply | Qty: 90 | Fill #2

## 2019-03-23 IMAGING — CR DG CHEST 2V
2 series · 2 of 2 positions shown · non-contrast
Comparison: None.

CLINICAL DATA: Acute right chest pain today.

EXAM:
CHEST  2 VIEW

[w chest pa]
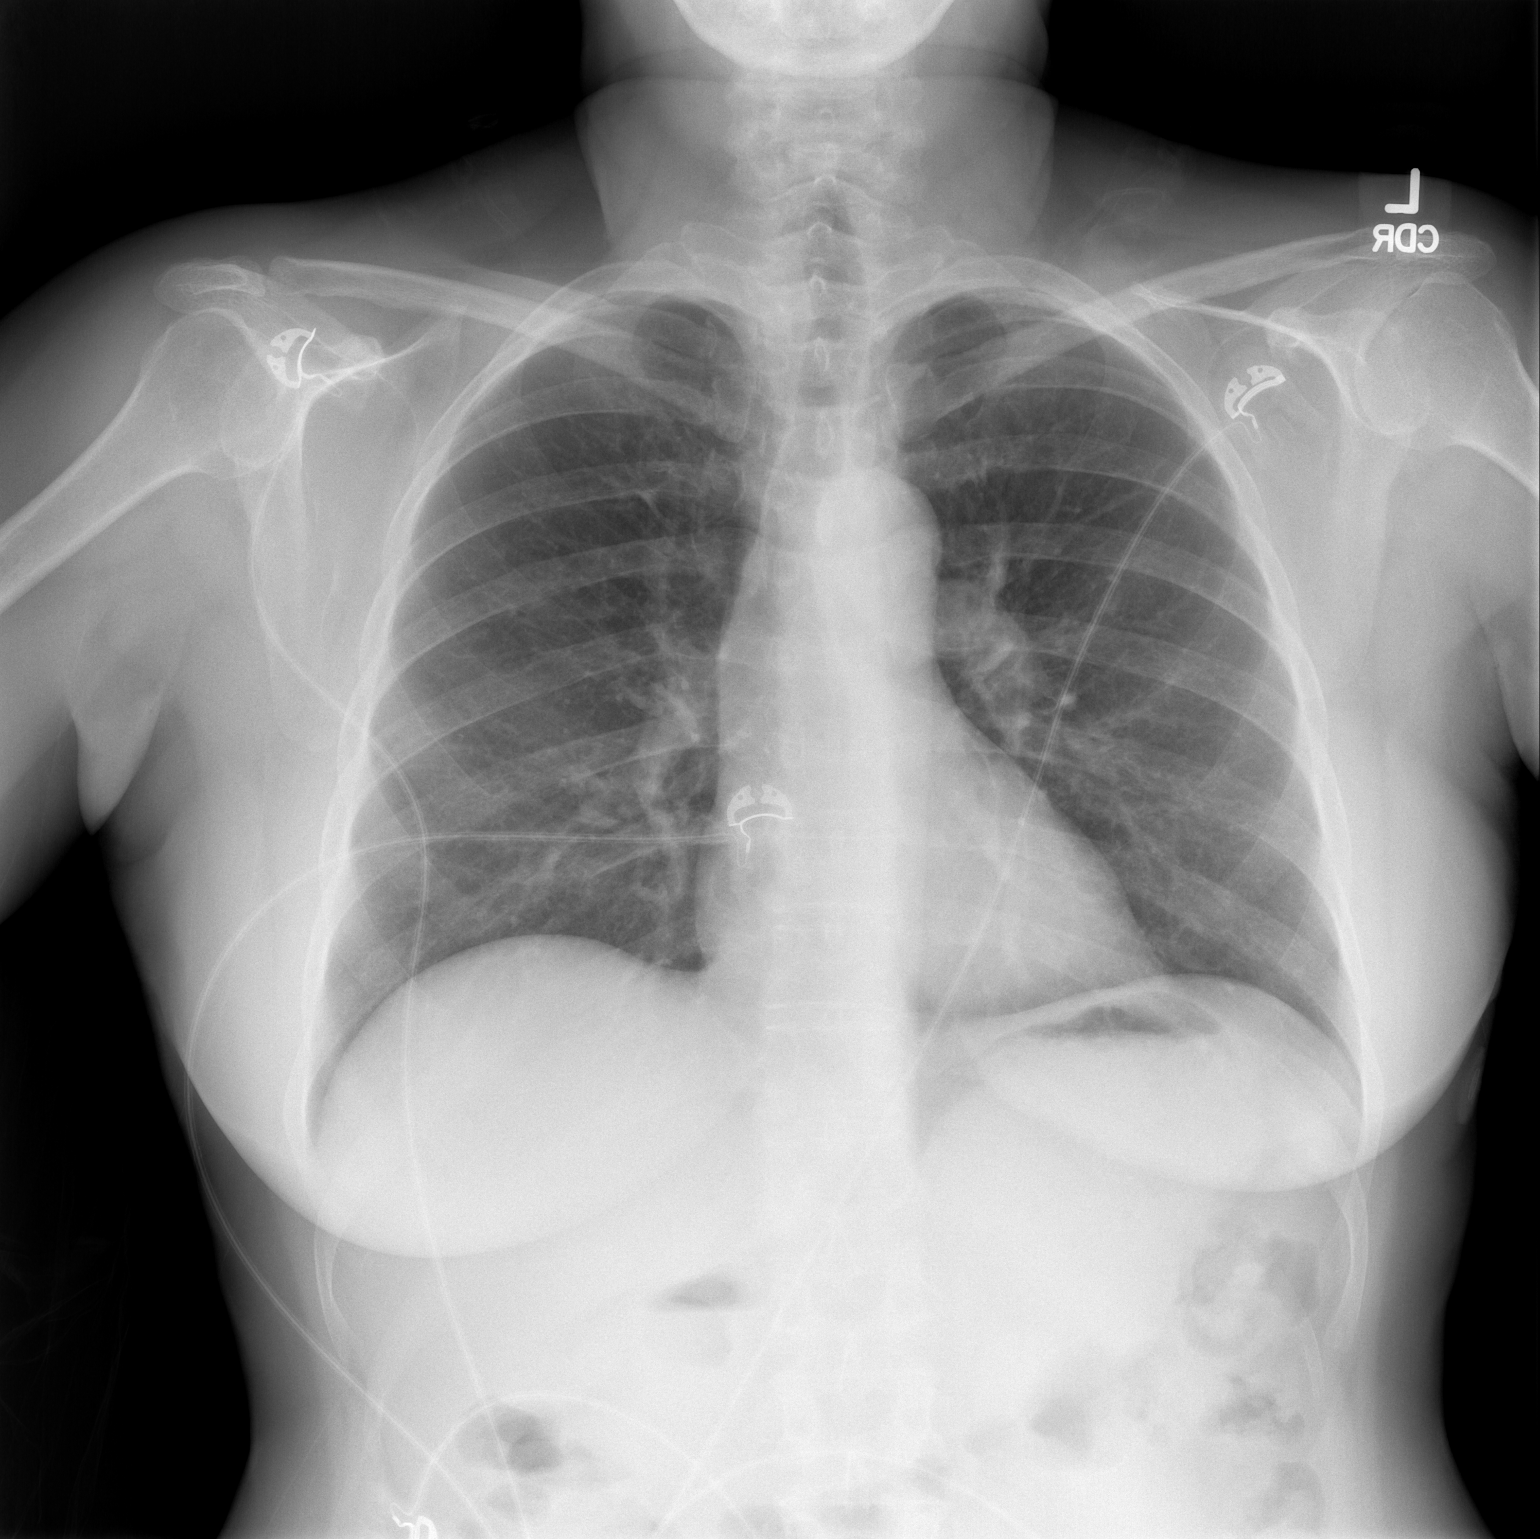

[w chest lat]
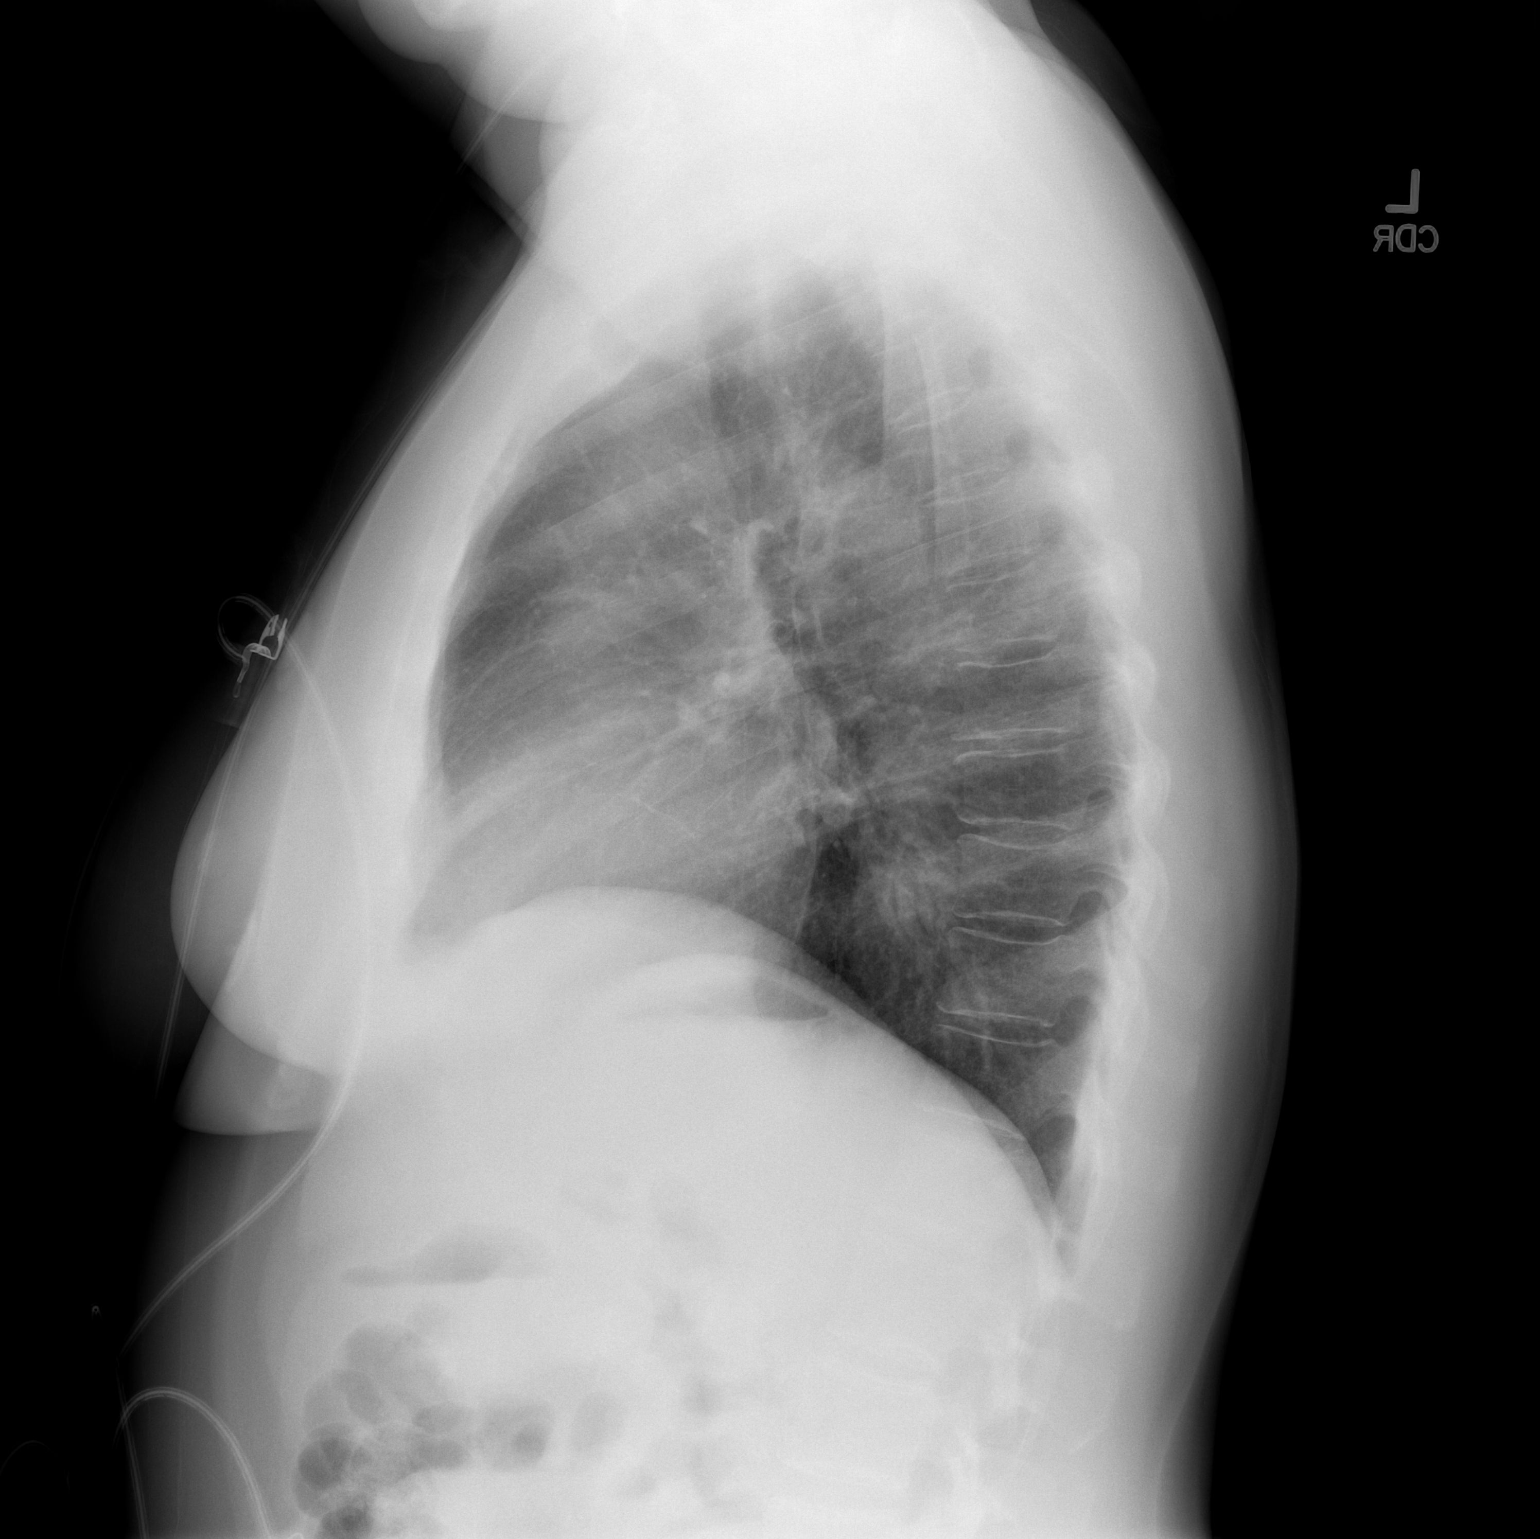

[2 of 2 positions shown; findings below may reference images not displayed]

FINDINGS: The cardiomediastinal silhouette is unremarkable.

There is no evidence of focal airspace disease, pulmonary edema,
suspicious pulmonary nodule/mass, pleural effusion, or pneumothorax.

No acute bony abnormalities are identified.
IMPRESSION: No active cardiopulmonary disease.

## 2019-03-24 IMAGING — RF DG CHOLANGIOGRAM OPERATIVE
1 series · 4 of 4 positions shown · non-contrast
Comparison: 03/08/2017

CLINICAL DATA: Calculus cholecystitis

EXAM:
INTRAOPERATIVE CHOLANGIOGRAM
TECHNIQUE: Cholangiographic images from the C-arm fluoroscopic device were
submitted for interpretation post-operatively. Please see the
procedural report for the amount of contrast and the fluoroscopy
time utilized.

[Series 1: run · 4 of 91 frames shown]
[frame 14/91]
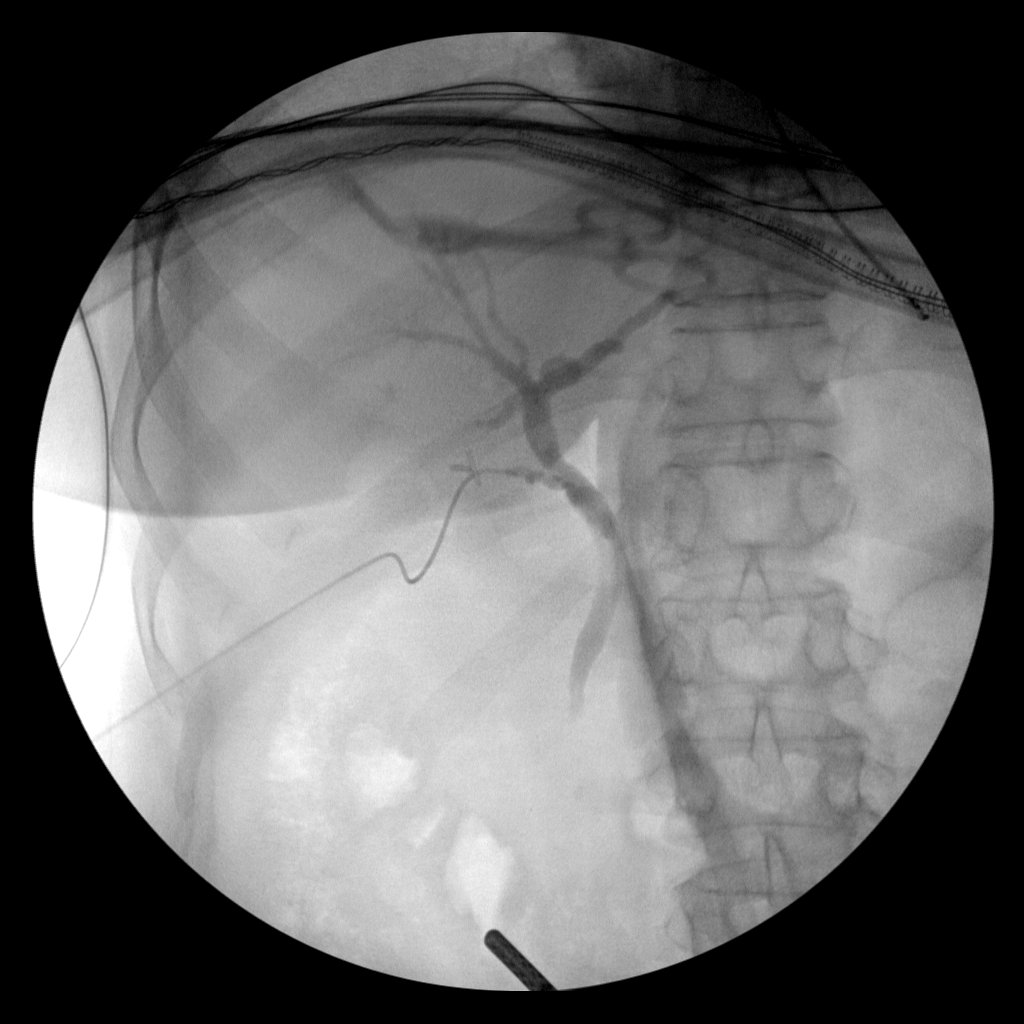
[frame 46/91]
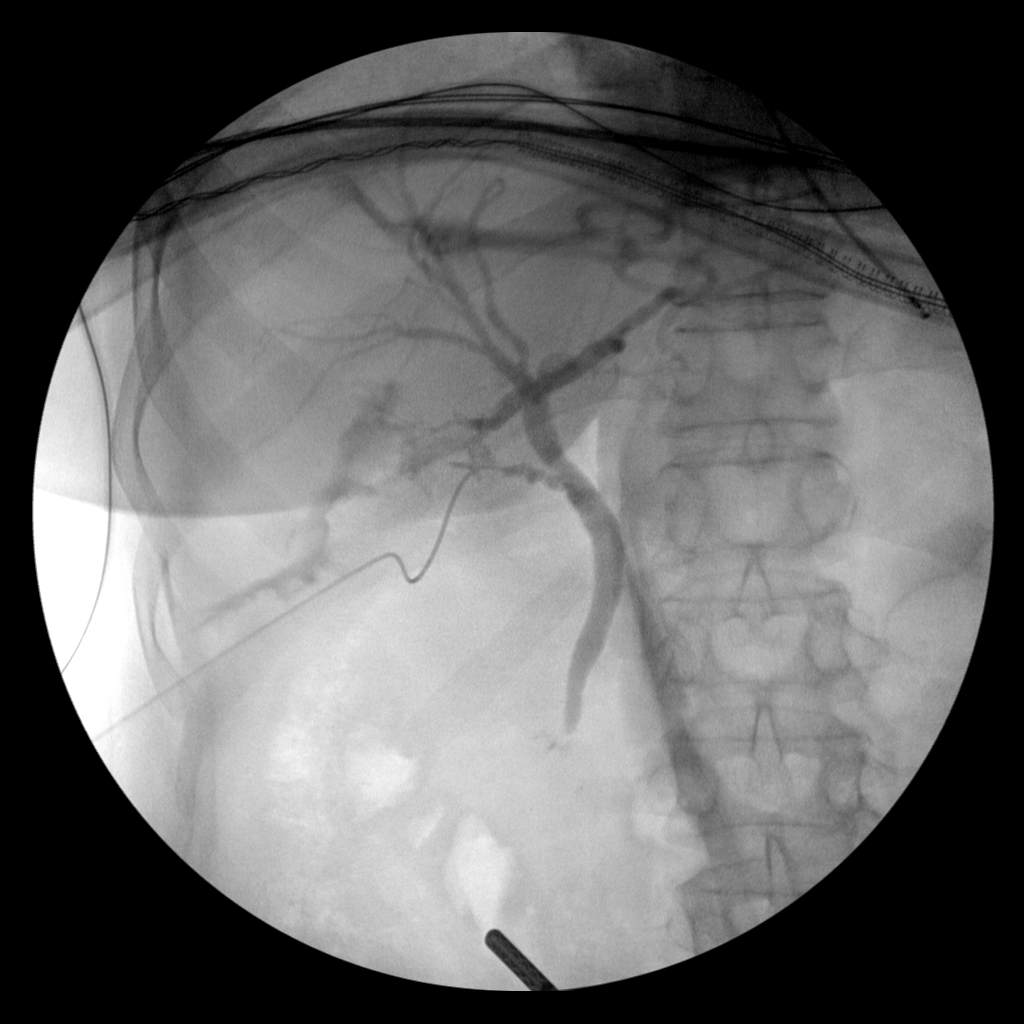
[frame 78/91]
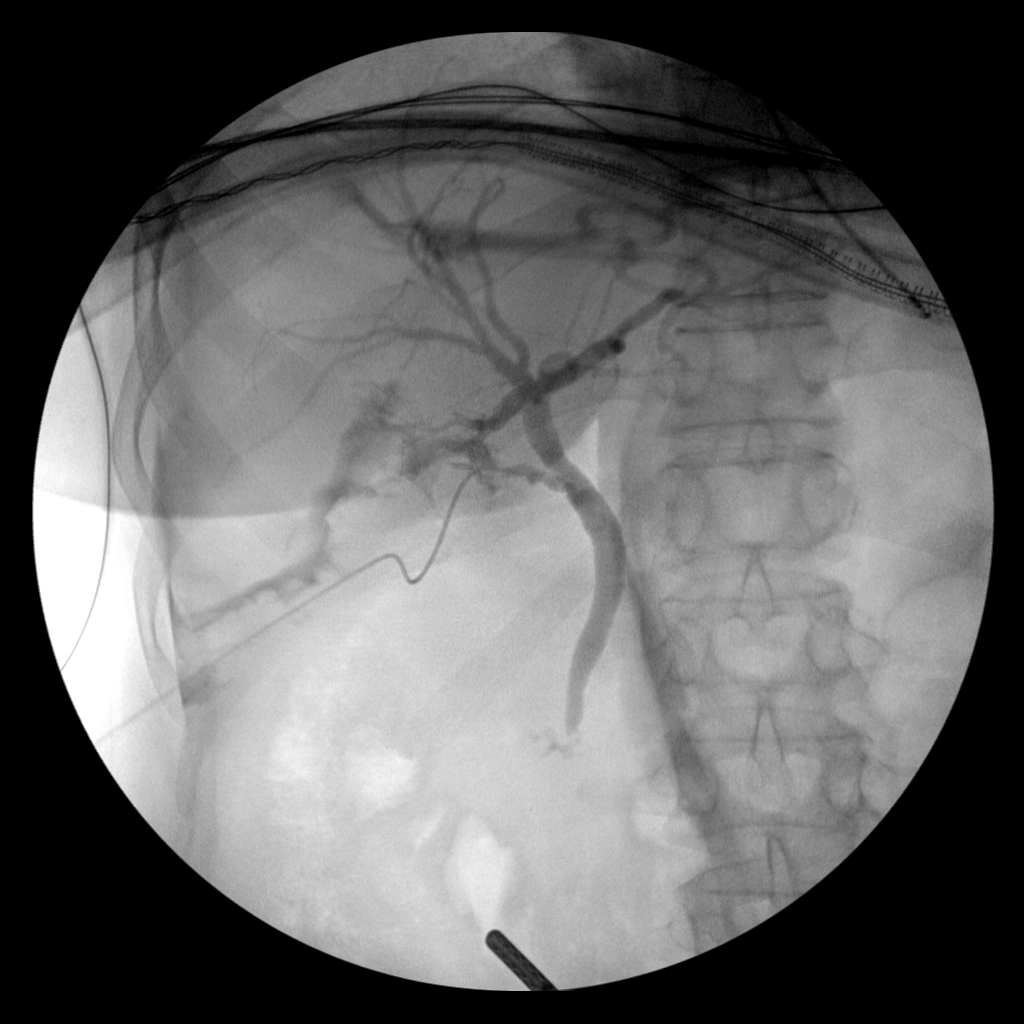
[frame 91/91]
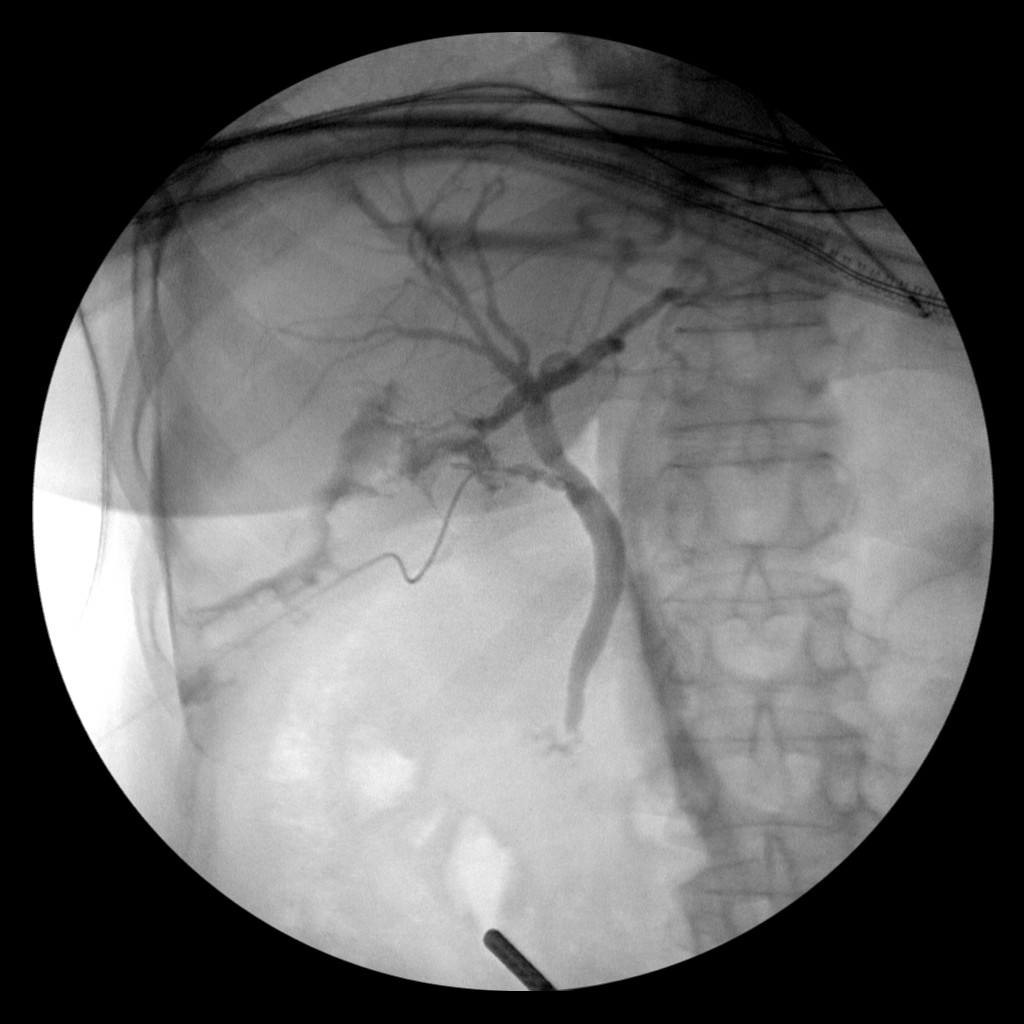

[4 of 4 positions shown; findings below may reference images not displayed]

FINDINGS: Intraoperative cholangiogram performed during the operative
procedure. There is leakage of contrast at the gallbladder fossa
along the right liver margin from the cystic duct catheter. The
biliary confluence, common hepatic duct, residual cystic duct, and
common bile duct are patent. Contrast does drain into the duodenum.
Mixing artifact noted throughout the biliary tree. Negative for
significant obstruction, filling defect, or stricture.
IMPRESSION: Patent biliary system.

## 2019-04-18 IMAGING — NM NM HEPATOBILIARY IMAGE, INC GB
1 series · 12 of 12 positions shown · non-contrast
Comparison: 03/12/2017

CLINICAL DATA: History of cholecystectomy and bile leak.

EXAM:
NUCLEAR MEDICINE HEPATOBILIARY IMAGING
TECHNIQUE: Sequential images of the abdomen were obtained [DATE] minutes
following intravenous administration of radiopharmaceutical.
RADIOPHARMACEUTICALS:  5.3 mCi 4c-TTm  Choletec IV

[Series 1: bile leak · 4.46mm/px · 2 acquisitions, 12 frames shown]
[im 1/2]
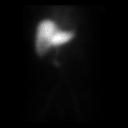
[im 1/2]
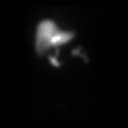
[im 1/2]
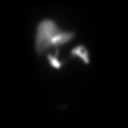
[im 1/2]
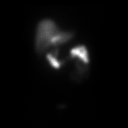
[im 1/2]
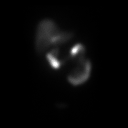
[im 1/2]
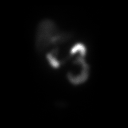
[im 2/2]
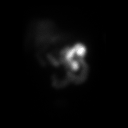
[im 2/2]
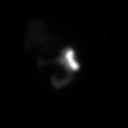
[im 2/2]
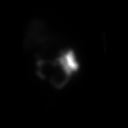
[im 2/2]
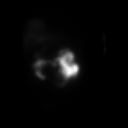
[im 2/2]
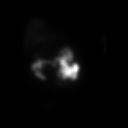
[im 2/2]
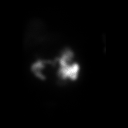

[12 of 12 positions shown; findings below may reference images not displayed]

FINDINGS: Prompt and symmetric uptake in the liver and normal excretion into
the biliary tree. The gallbladder is surgically absent. Activity is
seen in the small bowel by 15 minutes. No leak is identified.
IMPRESSION: Resolution of bile leak. Normal hepatobiliary scan post
cholecystectomy.

## 2019-04-21 ENCOUNTER — Telehealth: Payer: Self-pay

## 2019-04-21 DIAGNOSIS — I10 Essential (primary) hypertension: Secondary | ICD-10-CM

## 2019-04-21 NOTE — Telephone Encounter (Signed)
Pt left a vm msg requesting a blood pressure rx. As per pt, she continues to have elevated blood pressure readings at home. Pt stated that provider did discuss adding another bp rx at last visit. Pls advise, thanks.

## 2019-04-22 MED ORDER — HYDROCHLOROTHIAZIDE 12.5 MG PO TABS: 12.5000 mg | ORAL_TABLET | Freq: Every day | ORAL | 0 refills | Status: DC

## 2019-04-22 MED FILL — HYDROCHLOROTHIAZIDE 12.5 MG: 12.5 | 90 days supply | Qty: 90 | Fill #0

## 2019-04-22 NOTE — Telephone Encounter (Signed)
Pt has been updated and aware of new bp medication. Pt will contact the office with updates if needed regarding medications. Agreeable with provider's plan. Pt will have labs collected in April.

## 2019-04-22 NOTE — Telephone Encounter (Signed)
Sent!  Orders in to get labs in 6 weeks to monitor on new meds  Take WITH losartan

## 2019-04-27 MED FILL — ATORVASTATIN 40 MG TABLET: 40 | 90 days supply | Qty: 90 | Fill #3

## 2019-04-27 MED FILL — metFORMIN HCL ER 500 MG TB2: 500 | 90 days supply | Qty: 90 | Fill #3

## 2019-05-13 MED FILL — LOSARTAN POTASSIUM 25 MG TA: 25 | 90 days supply | Qty: 90 | Fill #3

## 2019-06-26 DIAGNOSIS — I10 Essential (primary) hypertension: Secondary | ICD-10-CM | POA: Diagnosis not present

## 2019-06-27 LAB — BASIC METABOLIC PANEL WITH GFR
BUN: 12 mg/dL (ref 7–25)
CO2: 27 mmol/L (ref 20–32)
Calcium: 10.1 mg/dL (ref 8.6–10.4)
Chloride: 106 mmol/L (ref 98–110)
Creat: 0.63 mg/dL (ref 0.50–0.99)
GFR, Est African American: 112 mL/min/{1.73_m2} (ref >=60)
GFR, Est Non African American: 97 mL/min/{1.73_m2} (ref >=60)
Glucose, Bld: 108 mg/dL — ABNORMAL HIGH (ref 65–99)
Potassium: 4.7 mmol/L (ref 3.5–5.3)
Sodium: 141 mmol/L (ref 135–146)

## 2019-07-02 ENCOUNTER — Ambulatory Visit (INDEPENDENT_AMBULATORY_CARE_PROVIDER_SITE_OTHER): Payer: BLUE CROSS/BLUE SHIELD | Admitting: Osteopathic Medicine

## 2019-07-02 ENCOUNTER — Other Ambulatory Visit: Payer: Self-pay

## 2019-07-02 ENCOUNTER — Encounter: Payer: Self-pay | Admitting: Osteopathic Medicine

## 2019-07-02 ENCOUNTER — Other Ambulatory Visit: Payer: Self-pay | Admitting: Osteopathic Medicine

## 2019-07-02 VITALS — BP 120/82 | HR 73 | Temp 97.8°F | Wt 160.1 lb

## 2019-07-02 DIAGNOSIS — E7800 Pure hypercholesterolemia, unspecified: Secondary | ICD-10-CM

## 2019-07-02 DIAGNOSIS — I10 Essential (primary) hypertension: Secondary | ICD-10-CM

## 2019-07-02 DIAGNOSIS — R7303 Prediabetes: Secondary | ICD-10-CM | POA: Diagnosis not present

## 2019-07-02 DIAGNOSIS — Z0000 Encounter for general adult medical examination without abnormal findings: Secondary | ICD-10-CM

## 2019-07-02 DIAGNOSIS — M8580 Other specified disorders of bone density and structure, unspecified site: Secondary | ICD-10-CM

## 2019-07-02 DIAGNOSIS — K219 Gastro-esophageal reflux disease without esophagitis: Secondary | ICD-10-CM

## 2019-07-02 LAB — POCT GLYCOSYLATED HEMOGLOBIN (HGB A1C): Hemoglobin A1C: 5.4 % (ref 4.0–5.6)

## 2019-07-02 MED ORDER — ATORVASTATIN CALCIUM 40 MG PO TABS: 40.0000 mg | ORAL_TABLET | Freq: Every day | ORAL | 3 refills | Status: DC

## 2019-07-02 MED ORDER — LOSARTAN POTASSIUM 25 MG PO TABS: 25.0000 mg | ORAL_TABLET | Freq: Every day | ORAL | 3 refills | Status: DC

## 2019-07-02 MED ORDER — OMEPRAZOLE 40 MG PO CPDR: 40.0000 mg | DELAYED_RELEASE_CAPSULE | Freq: Every day | ORAL | 3 refills | Status: DC

## 2019-07-02 MED ORDER — METFORMIN HCL ER 500 MG PO TB24: 500.0000 mg | ORAL_TABLET | Freq: Every day | ORAL | 3 refills | Status: DC

## 2019-07-02 NOTE — Progress Notes (Signed)
Whitney Neal is a 59 y.o. female who presents to  [place] at [facility]  today, 07/02/19, seeking care for the following: . ANNUAL PHYSICAL  . Follow chronic stable problems, labs reviewed  o Prediabetes: A1C due, lab error, didn't get w/ recent blood draw  o HTN: stopped HCTZ d/t urinry retention, feels better on just losartan  o GERD: stable, refilled Rx       ASSESSMENT & PLAN with other pertinent history/findings:  The primary encounter diagnosis was Annual physical exam. Diagnoses of High cholesterol, Essential hypertension, Prediabetes, Osteopenia, unspecified location, and Gastroesophageal reflux disease, unspecified whether esophagitis present were also pertinent to this visit.    Patient Instructions  There was some confusion at the lab and they didn't draw the cholesterol levels and a few other things. A1C today is 5.4 so sugars are doing really well!  General Preventive Care  Most recent routine screening labs: ordered!    Tobacco: don't!   Alcohol: responsible moderation is ok for most adults - if you have concerns about your alcohol intake, please talk to me!   Exercise: as tolerated to reduce risk of cardiovascular disease and worsening diabetes. Strength training will also prevent osteoporosis.   Mental health: if need for mental health care (medicines, counseling, other), or concerns about moods, please let me know!   Sexual health: if need for STD testing, or if concerns with libido/pain problems, please let me know!   Advanced Directive: Living Will and/or Healthcare Power of Attorney recommended for all adults, regardless of age or health.  Vaccines  Flu vaccine: for almost everyone, every fall.   Shingles vaccine: after age 11. Needs dose #2  Pneumonia vaccines: after age 72  Tetanus booster: every 10 years. Due 2030.   COVID vaccine: RECOMMENDED  Cancer screenings   Colon cancer screening: for  everyone age 29-75. Colonoscopy next year!   Breast cancer screening: mammogram annually age 22-75 due 12/2019  Cervical cancer screening: Pap not needed w/ hysterectomy.   Lung cancer screening: not needed for non-smokers  Infection screenings  . HIV: recommended screening at least once age 38-65 . Gonorrhea/Chlamydia: screening as needed . Hepatitis C: recommended once for everyone age 74-75 . TB: certain at-risk populations Other . Bone Density Test: recommended given history of osteopenia     Orders Placed This Encounter  Procedures  . DG Bone Density  . Lipid panel  . Hepatic function panel  . CBC  . POCT HgB A1C  Can get labs next visit   Meds ordered this encounter  Medications  . atorvastatin (LIPITOR) 40 MG tablet    Sig: Take 1 tablet (40 mg total) by mouth daily.    Dispense:  90 tablet    Refill:  3  . losartan (COZAAR) 25 MG tablet    Sig: Take 1 tablet (25 mg total) by mouth daily.    Dispense:  90 tablet    Refill:  3  . metFORMIN (GLUCOPHAGE XR) 500 MG 24 hr tablet    Sig: Take 1 tablet (500 mg total) by mouth daily with breakfast.    Dispense:  90 tablet    Refill:  3  . omeprazole (PRILOSEC) 40 MG capsule    Sig: Take 1 capsule (40 mg total) by mouth daily.    Dispense:  90 capsule    Refill:  3    Constitutional:  . VSS, see nurse notes . General Appearance: alert, well-developed, well-nourished, NAD Eyes: .  Normal lids and conjunctive, non-icteric sclera . PERRLA Ears, Nose, Mouth, Throat: . Normal appearance Neck: . No masses, trachea midline . No thyroid enlargement/tenderness/mass appreciated Respiratory: . Normal respiratory effort . No dullness/hyper-resonance to percussion . Breath sounds normal, no wheeze/rhonchi/rales Cardiovascular: . S1/S2 normal, no murmur/rub/gallop auscultated . No lower extremity edema Gastrointestinal: . Nontender, no masses . No hepatomegaly, no splenomegaly . No hernia  appreciated Musculoskeletal:  . Gait normal . No clubbing/cyanosis of digits Neurological: . No cranial nerve deficit on limited exam . Motor and sensation intact and symmetric Psychiatric: . Normal judgment/insight . Normal mood and affect    Follow-up instructions: Return in about 6 months (around 01/02/2020) for routine blood pressure and A1C check-up. See me sooner if needed! CALL FOR LABS week before appt .                                         BP 120/82 (BP Location: Left Arm, Patient Position: Sitting, Cuff Size: Normal)   Pulse 73   Temp 97.8 F (36.6 C) (Oral)   Wt 160 lb 1.3 oz (72.6 kg)   BMI 30.25 kg/m   Current Meds  Medication Sig  . aspirin 81 MG EC tablet Take 81 mg by mouth daily.    Marland Kitchen dicyclomine (BENTYL) 20 MG tablet Take 1 tablet (20 mg total) by mouth 4 (four) times daily as needed for spasms.  . fluticasone (FLONASE) 50 MCG/ACT nasal spray Place 1 spray into both nostrils daily as needed for allergies or rhinitis.  . hydrochlorothiazide (HYDRODIURIL) 12.5 MG tablet Take 1 tablet (12.5 mg total) by mouth daily. Will need labs around 06/04/2019 - orders are in  . Multiple Vitamins-Minerals (MULTIVITAMIN PO) Take 1 tablet by mouth daily.  . ondansetron (ZOFRAN-ODT) 8 MG disintegrating tablet Take 1 tablet (8 mg total) by mouth every 8 (eight) hours as needed for nausea.  . [DISCONTINUED] atorvastatin (LIPITOR) 40 MG tablet Take 1 tablet (40 mg total) by mouth daily.  . [DISCONTINUED] losartan (COZAAR) 25 MG tablet Take 1 tablet (25 mg total) by mouth daily.  . [DISCONTINUED] omeprazole (PRILOSEC) 40 MG capsule Take 1 capsule (40 mg total) by mouth daily.    Results for orders placed or performed in visit on 07/02/19 (from the past 72 hour(s))  POCT HgB A1C     Status: None   Collection Time: 07/02/19  9:57 AM  Result Value Ref Range   Hemoglobin A1C 5.4 4.0 - 5.6 %   HbA1c POC (<> result, manual entry)     HbA1c, POC  (prediabetic range)     HbA1c, POC (controlled diabetic range)      No results found.  Depression screen [facility] 2/9 07/02/2019 12/29/2018 04/30/2018  Decreased Interest 0 0 0  Down, Depressed, Hopeless 0 0 0  PHQ - 2 Score 0 0 0  Altered sleeping 0 - -  Tired, decreased energy 0 - -  Change in appetite 0 - -  Feeling bad or failure about yourself  0 - -  Trouble concentrating 0 - -  Moving slowly or fidgety/restless 0 - -  Suicidal thoughts 0 - -  PHQ-9 Score 0 - -  Difficult doing work/chores - - -    GAD 7 : Generalized Anxiety Score 07/02/2019 12/29/2018 04/30/2018 12/30/2017  Nervous, Anxious, on Edge 0 0 0 0  Control/stop worrying 0 0 0 0  Worry too  much - different things 0 0 0 0  Trouble relaxing 0 0 0 0  Restless 0 0 0 0  Easily annoyed or irritable 0 0 0 0  Afraid - awful might happen 0 0 0 0  Total GAD 7 Score 0 0 0 0  Anxiety Difficulty Not difficult at all Not difficult at all Not difficult at all Not difficult at all      All questions at time of visit were answered - patient instructed to contact office with any additional concerns or updates.  ER/RTC precautions were reviewed with the patient.  Please note: voice recognition software was used to produce this document, and typos may escape review. Please contact Dr. Sheppard Coil for any needed clarifications.

## 2019-07-02 NOTE — Patient Instructions (Addendum)
There was some confusion at the lab and they didn't draw the cholesterol levels and a few other things. A1C today is 5.4 so sugars are doing really well!  General Preventive Care  Most recent routine screening labs: ordered!    Tobacco: don't!   Alcohol: responsible moderation is ok for most adults - if you have concerns about your alcohol intake, please talk to me!   Exercise: as tolerated to reduce risk of cardiovascular disease and worsening diabetes. Strength training will also prevent osteoporosis.   Mental health: if need for mental health care (medicines, counseling, other), or concerns about moods, please let me know!   Sexual health: if need for STD testing, or if concerns with libido/pain problems, please let me know!   Advanced Directive: Living Will and/or Healthcare Power of Attorney recommended for all adults, regardless of age or health.  Vaccines  Flu vaccine: for almost everyone, every fall.   Shingles vaccine: after age 44. Needs dose #2  Pneumonia vaccines: after age 48  Tetanus booster: every 10 years. Due 2030.   COVID vaccine: RECOMMENDED  Cancer screenings   Colon cancer screening: for everyone age 24-75. Colonoscopy next year!   Breast cancer screening: mammogram annually age 41-75 due 12/2019  Cervical cancer screening: Pap not needed w/ hysterectomy.   Lung cancer screening: not needed for non-smokers  Infection screenings  . HIV: recommended screening at least once age 82-65 . Gonorrhea/Chlamydia: screening as needed . Hepatitis C: recommended once for everyone age 71-75 . TB: certain at-risk populations Other . Bone Density Test: recommended given history of osteopenia

## 2019-07-06 MED FILL — ATORVASTATIN CALCIUM 40 MG: 40 | 90 days supply | Qty: 90 | Fill #0

## 2019-07-06 MED FILL — metFORMIN HCL ER 500 MG TB2: 500 | 90 days supply | Qty: 90 | Fill #0

## 2019-07-08 ENCOUNTER — Ambulatory Visit (INDEPENDENT_AMBULATORY_CARE_PROVIDER_SITE_OTHER): Payer: BLUE CROSS/BLUE SHIELD

## 2019-07-08 ENCOUNTER — Other Ambulatory Visit: Payer: Self-pay

## 2019-07-08 DIAGNOSIS — M8580 Other specified disorders of bone density and structure, unspecified site: Secondary | ICD-10-CM | POA: Diagnosis not present

## 2019-08-31 MED FILL — LOSARTAN POTASSIUM 25 MG TA: 25 | 90 days supply | Qty: 90 | Fill #0

## 2019-11-26 MED FILL — LOSARTAN POTASSIUM 25 MG TA: 25 | 30 days supply | Qty: 30 | Fill #1

## 2019-11-26 MED FILL — metFORMIN HCL ER 500 MG TB2: 500 | 90 days supply | Qty: 90 | Fill #1

## 2019-11-26 MED FILL — ATORVASTATIN CALCIUM 40 MG: 40 | 90 days supply | Qty: 90 | Fill #1

## 2020-01-01 ENCOUNTER — Encounter: Payer: Self-pay | Admitting: Osteopathic Medicine

## 2020-01-01 ENCOUNTER — Ambulatory Visit (INDEPENDENT_AMBULATORY_CARE_PROVIDER_SITE_OTHER): Payer: BLUE CROSS/BLUE SHIELD | Admitting: Osteopathic Medicine

## 2020-01-01 VITALS — BP 138/83 | HR 80 | Temp 98.1°F | Wt 156.0 lb

## 2020-01-01 DIAGNOSIS — Z0000 Encounter for general adult medical examination without abnormal findings: Secondary | ICD-10-CM

## 2020-01-01 DIAGNOSIS — E7800 Pure hypercholesterolemia, unspecified: Secondary | ICD-10-CM | POA: Diagnosis not present

## 2020-01-01 DIAGNOSIS — I10 Essential (primary) hypertension: Secondary | ICD-10-CM

## 2020-01-01 DIAGNOSIS — R7303 Prediabetes: Secondary | ICD-10-CM

## 2020-01-01 NOTE — Progress Notes (Signed)
Whitney Neal is a 59 y.o. female who presents to  [place] at [facility]  today, 01/01/20, seeking care for the following:  . Follow-up Prediabetes, HTN  . Labs reviewed: CBC, CMP, TB Quant Gold ok. Glc 104 . BP up a bit, she checks this at work, generally <130/80    BP Readings from Last 3 Encounters:  01/01/20 138/83  07/02/19 120/82  12/29/18 (!) 132/92   Wt Readings from Last 3 Encounters:  01/01/20 156 lb 0.6 oz (70.8 kg)  07/02/19 160 lb 1.3 oz (72.6 kg)  12/29/18 158 lb 1.9 oz (71.7 kg)      ASSESSMENT & PLAN with other pertinent findings:  The primary encounter diagnosis was Prediabetes. Diagnoses of Annual physical exam, Essential hypertension, and High cholesterol were also pertinent to this visit.   Doing well   Labs ordered for future visit. Annual physical / preventive care was NOT performed or billed today.   No results found for this or any previous visit (from the past 24 hour(s)).   There are no Patient Instructions on file for this visit.  Orders Placed This Encounter  Procedures  . CBC  . COMPLETE METABOLIC PANEL WITH GFR  . Lipid panel  . Hemoglobin A1c  . POCT HgB A1C    No orders of the defined types were placed in this encounter.      Follow-up instructions: Return in about 6 months (around 06/30/2020) for [place] (labs 2+ days prior to appt, orders are in).                                         BP 138/83 (BP Location: Left Arm, Patient Position: Sitting, Cuff Size: Normal)   Pulse 80   Temp 98.1 F (36.7 C) (Oral)   Wt 156 lb 0.6 oz (70.8 kg)   BMI 29.48 kg/m   Constitutional:  . VSS, see nurse notes . General Appearance: alert, well-developed, well-nourished, NAD Neck: . No masses, trachea midline . No thyroid enlargement/tenderness/mass appreciated Respiratory: . Normal respiratory effort . Breath sounds normal, no  wheeze/rhonchi/rales Cardiovascular: . S1/S2 normal, no murmur/rub/gallop auscultated . No carotid bruit or JVD Musculoskeletal:  . Gait normal Psychiatric: . Normal judgment/insight . Normal mood and affect  Current Meds  Medication Sig  . aspirin 81 MG EC tablet Take 81 mg by mouth daily.    Marland Kitchen atorvastatin (LIPITOR) 40 MG tablet Take 1 tablet (40 mg total) by mouth daily.  Marland Kitchen dicyclomine (BENTYL) 20 MG tablet Take 1 tablet (20 mg total) by mouth 4 (four) times daily as needed for spasms.  . fluticasone (FLONASE) 50 MCG/ACT nasal spray Place 1 spray into both nostrils daily as needed for allergies or rhinitis.  . hydrochlorothiazide (HYDRODIURIL) 12.5 MG tablet Take 1 tablet (12.5 mg total) by mouth daily. Will need labs around 06/04/2019 - orders are in  . losartan (COZAAR) 25 MG tablet Take 1 tablet (25 mg total) by mouth daily.  . metFORMIN (GLUCOPHAGE XR) 500 MG 24 hr tablet Take 1 tablet (500 mg total) by mouth daily with breakfast.  . Multiple Vitamins-Minerals (MULTIVITAMIN PO) Take 1 tablet by mouth daily.  Marland Kitchen omeprazole (PRILOSEC) 40 MG capsule Take 1 capsule (40 mg total) by mouth daily.  . ondansetron (ZOFRAN-ODT) 8 MG disintegrating tablet Take 1 tablet (8 mg total) by mouth every 8 (eight) hours as  needed for nausea.    No results found for this or any previous visit (from the past 72 hour(s)).  No results found.     All questions at time of visit were answered - patient instructed to contact office with any additional concerns or updates.  ER/RTC precautions were reviewed with the patient as applicable.   Please note: voice recognition software was used to produce this document, and typos may escape review. Please contact Dr. Sheppard Coil for any needed clarifications.

## 2020-01-21 MED FILL — LOSARTAN POTASSIUM 25 MG TA: 25 | 30 days supply | Qty: 30 | Fill #2

## 2020-01-22 ENCOUNTER — Ambulatory Visit: Payer: BLUE CROSS/BLUE SHIELD | Attending: Internal Medicine

## 2020-01-22 ENCOUNTER — Other Ambulatory Visit (HOSPITAL_BASED_OUTPATIENT_CLINIC_OR_DEPARTMENT_OTHER): Payer: Self-pay | Admitting: Internal Medicine

## 2020-01-22 DIAGNOSIS — Z23 Encounter for immunization: Secondary | ICD-10-CM

## 2020-01-22 NOTE — Progress Notes (Signed)
   Covid-19 Vaccination Clinic  Name:  Whitney Neal    MRN: [id number] DOB: 1960-06-23  01/22/2020  [name] was observed post Covid-19 immunization for 15 minutes without incident. She was provided with Vaccine Information Sheet and instruction to access the V-Safe system.   [name] was instructed to call 911 with any severe reactions post vaccine: Marland Kitchen Difficulty breathing  . Swelling of face and throat  . A fast heartbeat  . A bad rash all over body  . Dizziness and weakness   Immunizations Administered    Name Date Dose VIS Date [place] COVID-19 Vaccine 01/22/2020 11:27 AM 0.3 mL 12/09/2019 Intramuscular   Manufacturer: [employer]   Lot: [id number]   [place]: [id number]

## 2020-01-26 MED FILL — PFIZER-BIONTECH COVID-19 VA: 30 | 1 days supply | Qty: 0 | Fill #0

## 2020-02-22 MED FILL — ATORVASTATIN CALCIUM 40 MG: 40 | 90 days supply | Qty: 90 | Fill #2

## 2020-02-22 MED FILL — LOSARTAN POTASSIUM 25 MG TA: 25 | 30 days supply | Qty: 30 | Fill #3

## 2020-02-22 MED FILL — metFORMIN HCL ER 500 MG TB2: 500 | 90 days supply | Qty: 90 | Fill #2

## 2020-03-25 MED FILL — LOSARTAN POTASSIUM 25 MG TA: 25 | 30 days supply | Qty: 30 | Fill #4

## 2020-04-26 MED FILL — LOSARTAN POTASSIUM 25 MG TA: 25 | 30 days supply | Qty: 30 | Fill #5

## 2020-05-11 MED FILL — OMEPRAZOLE 40 MG CPDR: 40 | 90 days supply | Qty: 90 | Fill #1

## 2020-05-25 ENCOUNTER — Other Ambulatory Visit: Payer: Self-pay

## 2020-05-25 ENCOUNTER — Encounter: Payer: Self-pay | Admitting: Osteopathic Medicine

## 2020-05-25 ENCOUNTER — Other Ambulatory Visit (HOSPITAL_BASED_OUTPATIENT_CLINIC_OR_DEPARTMENT_OTHER): Payer: Self-pay

## 2020-05-25 ENCOUNTER — Ambulatory Visit (INDEPENDENT_AMBULATORY_CARE_PROVIDER_SITE_OTHER): Payer: 59

## 2020-05-25 ENCOUNTER — Ambulatory Visit (INDEPENDENT_AMBULATORY_CARE_PROVIDER_SITE_OTHER): Payer: 59 | Admitting: Osteopathic Medicine

## 2020-05-25 VITALS — BP 145/80 | HR 86 | Temp 98.0°F | Wt 149.1 lb

## 2020-05-25 DIAGNOSIS — M62838 Other muscle spasm: Secondary | ICD-10-CM

## 2020-05-25 DIAGNOSIS — M542 Cervicalgia: Secondary | ICD-10-CM

## 2020-05-25 DIAGNOSIS — M47812 Spondylosis without myelopathy or radiculopathy, cervical region: Secondary | ICD-10-CM | POA: Diagnosis not present

## 2020-05-25 DIAGNOSIS — R079 Chest pain, unspecified: Secondary | ICD-10-CM | POA: Diagnosis not present

## 2020-05-25 MED ORDER — CYCLOBENZAPRINE HCL 10 MG PO TABS
5.0000 mg | ORAL_TABLET | Freq: Three times a day (TID) | ORAL | 1 refills | Status: DC | PRN
  Filled 2020-05-25: qty 60, 20d supply, fill #0

## 2020-05-25 NOTE — Progress Notes (Signed)
Whitney Neal is a 60 y.o. female who presents to  [place] [facility]  today, 05/25/20, seeking care for the following:  . Neck pain -ongoing for maybe a week or so, no particular injury she can recall.  Feels like it is radiating into the left arm and wrapping around chest.  No chest pain on exertion, on exam, there is muscle tenderness to paraspinal muscles at left side base of cervical spine, left chest tender to palpation, EKG normal.  CV exam normal.     ASSESSMENT & PLAN with other pertinent findings:  The primary encounter diagnosis was Muscle spasms of neck. A diagnosis of Chest pain in adult - not cardiac was also pertinent to this visit.   OMT applied: Functional positional release, myofascial release, to tender point left cervical spine, upper thoracic spine, rhomboids.  There are no Patient Instructions on file for this visit.  Orders Placed This Encounter  Procedures  . DG Cervical Spine 2 or 3 views  . Ambulatory referral to Physical Therapy  . EKG 12-Lead    Meds ordered this encounter  Medications  . cyclobenzaprine (FLEXERIL) 10 MG tablet    Sig: Take 1/2-1 tablets (5-10 mg total) by mouth 3 (three) times daily as needed for muscle spasms. Caution: can cause drowsiness    Dispense:  60 tablet    Refill:  1     See below for relevant physical exam findings  See below for recent lab and imaging results reviewed  Medications, allergies, PMH, PSH, SocH, FamH reviewed below    Follow-up instructions: Return for [place].                                        Exam:  BP (!) 145/80 (BP Location: Left Arm, Patient Position: Sitting, Cuff Size: Normal)   Pulse 86   Temp 98 F (36.7 C) (Oral)   Wt 149 lb 1.3 oz (67.6 kg)   BMI 28.17 kg/m   Constitutional: VS see above. General Appearance: alert, well-developed, well-nourished, NAD  Neck: No  masses, trachea midline.   Respiratory: Normal respiratory effort. no wheeze, no rhonchi, no rales  Cardiovascular: S1/S2 normal, no murmur, no rub/gallop auscultated. RRR.   Musculoskeletal: Gait normal. Symmetric and independent movement of all extremities -see above for osteopathic exam and chest wall exam  Neurological: Normal balance/coordination. No tremor.  Skin: warm, dry, intact.   Psychiatric: Normal judgment/insight. Normal mood and affect. Oriented x3.   Current Meds  Medication Sig  . aspirin 81 MG EC tablet Take 81 mg by mouth daily.  Marland Kitchen atorvastatin (LIPITOR) 40 MG tablet TAKE 1 TABLET (40 MG TOTAL) BY MOUTH DAILY.  Marland Kitchen COVID-19 mRNA vaccine, Pfizer, 30 MCG/0.3ML injection INJECT AS DIRECTED  . cyclobenzaprine (FLEXERIL) 10 MG tablet Take 1/2-1 tablets (5-10 mg total) by mouth 3 (three) times daily as needed for muscle spasms. Caution: can cause drowsiness  . dicyclomine (BENTYL) 20 MG tablet Take 1 tablet (20 mg total) by mouth 4 (four) times daily as needed for spasms.  . fluticasone (FLONASE) 50 MCG/ACT nasal spray Place 1 spray into both nostrils daily as needed for allergies or rhinitis.  . hydrochlorothiazide (HYDRODIURIL) 12.5 MG tablet Take 1 tablet (12.5 mg total) by mouth daily. Will need labs around 06/04/2019 - orders are in  . losartan (COZAAR) 25 MG tablet TAKE  1 TABLET (25 MG) BY MOUTH DAILY.  . metFORMIN (GLUCOPHAGE-XR) 500 MG 24 hr tablet TAKE 1 TABLET (500 MG TOTAL) BY MOUTH DAILY WITH BREAKFAST.  . Multiple Vitamins-Minerals (MULTIVITAMIN PO) Take 1 tablet by mouth daily.  Marland Kitchen omeprazole (PRILOSEC) 40 MG capsule TAKE 1 CAPSULE (40 MG TOTAL) BY MOUTH DAILY.  Marland Kitchen ondansetron (ZOFRAN-ODT) 8 MG disintegrating tablet Take 1 tablet (8 mg total) by mouth every 8 (eight) hours as needed for nausea.    Allergies  Allergen Reactions  . Codeine Other (See Comments)    REACTION: headache, visual disturbance  . Morphine Other (See Comments)    Unknown  . Morphine And  Related Other (See Comments)    Unknown  . Other Rash    Ceftriaxone Sodium In Dextrose  . Rocephin [Ceftriaxone Sodium In Dextrose] Rash    Patient Active Problem List   Diagnosis Date Noted  . Prediabetes 07/01/2018  . Need for shingles vaccine 12/30/2017  . Hepatic abscesses s/p perc drainage 04/04/2017 04/04/2017  . Nausea & vomiting 04/02/2017  . Delayed bile leak s/p ERCP/stenting 03/14/2017   . Acute on chronic cholecystitis s/p lap cholecystectomy 03/09/2017 03/09/2017  . 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 Constipation   . Hemangioma of liver 02/04/2017  . Migraine 10/01/2011  . Numbness and tingling in left hand 01/05/2011  . Hyperglycemia   . Essential hypertension 01/25/2010  . GERD 01/25/2010  . COLONIC POLYPS, HYPERPLASTIC, HX OF 01/25/2010  . CERVICAL STRAIN 11/23/2009  . Hyperlipidemia 09/26/2009  . Osteopenia 07/11/2009  . GASTRIC ULCER, HX OF 07/11/2009  . History of Barrett's esophagus 07/11/2009  . Vitamin D deficiency 07/04/2009    Family History  Problem Relation Age of Onset  . Colon cancer Father   . Hypertension Father   . Heart attack Father 27  . Cancer Father        prostate CA  . Pulmonary embolism Mother        deceased  . Hypertension Mother   . Diabetes Sister   . Hypertension Brother   . Hypertension Sister   . Cancer Paternal Grandmother        oral  . Thyroid disease Sister        1/2 sister thyroid problem  . Healthy Daughter   . Heart attack Brother 64  . Breast cancer Cousin     Social History   Tobacco Use  Smoking Status Never Smoker  Smokeless Tobacco Never Used    Past Surgical History:  Procedure Laterality Date  . ABDOMINAL HYSTERECTOMY  2009   Dr. Bethann Goo - benign disease  . BLADDER SURGERY     bladder mesh surgery 2010- Dr. Rhodia Albright  . BREAST CYST ASPIRATION    . COLONOSCOPY W/ POLYPECTOMY  2010  . ENDOSCOPIC RETROGRADE CHOLANGIOPANCREATOGRAPHY (ERCP) WITH PROPOFOL N/A  05/03/2017   Procedure: ENDOSCOPIC RETROGRADE CHOLANGIOPANCREATOGRAPHY (ERCP) WITH PROPOFOL;  Surgeon: Carol Ada, MD;  Location: [facility];  Service: Endoscopy;  Laterality: N/A;  . ERCP N/A 03/13/2017   Procedure: ENDOSCOPIC RETROGRADE CHOLANGIOPANCREATOGRAPHY (ERCP);  Surgeon: Carol Ada, MD;  Location: [place];  Service: Endoscopy;  Laterality: N/A;  . ERCP N/A 03/14/2017   Procedure: ENDOSCOPIC RETROGRADE CHOLANGIOPANCREATOGRAPHY (ERCP);  Surgeon: Ladene Artist, MD;  Location: [facility];  Service: Endoscopy;  Laterality: N/A;  . IR RADIOLOGIST EVAL & MGMT  04/16/2017  . LAPAROSCOPIC CHOLECYSTECTOMY SINGLE PORT N/A 03/09/2017   Procedure: LAPAROSCOPIC CHOLECYSTECTOMY WITH INTRAOPERATIVE CHOLANGIOGRAM, CLOSURE OF ABERRANT DUCT OF  LUSCHKA;  Surgeon: Michael Boston, MD;  Location: [facility];  Service: General;  Laterality: N/A;    Immunization History  Administered Date(s) Administered  . Influenza Split 12/21/2010, 11/20/2011  . Influenza, Seasonal, Injecte, Preservative Fre 11/17/2018  . Influenza,inj,Quad PF,6+ Mos 11/20/2014, 12/30/2017  . Influenza-Unspecified 11/27/2013, 12/24/2019  . PFIZER(Purple Top)SARS-COV-2 Vaccination 05/07/2019, 06/01/2019, 01/22/2020  . Td 09/01/2008  . Tdap 07/01/2018  . Zoster Recombinat (Shingrix) 12/08/2018    No results found for this or any previous visit (from the past 2160 hour(s)).  DG Cervical Spine 2 or 3 views  Result Date: 05/26/2020 CLINICAL DATA:  60 year old female with history of neck pain. EXAM: CERVICAL SPINE - 2-3 VIEW COMPARISON:  None. FINDINGS: There is no evidence of cervical spine fracture or prevertebral soft tissue swelling. Mild reversal of the normal cervical lordosis superiorly. Alignment is otherwise normal. Minimal multilevel disc osteophyte formation with preserved disc spaces. IMPRESSION: Minimal multilevel degenerative changes of the cervical spine. No acute fracture or malalignment. Electronically Signed   By:  Ruthann Cancer MD   On: 05/26/2020 09:54       All questions at time of visit were answered - patient instructed to contact office with any additional concerns or updates. ER/RTC precautions were reviewed with the patient as applicable.   Please note: manual typing as well as voice recognition software may have been used to produce this document - typos may escape review. Please contact Dr. Sheppard Coil for any needed clarifications.

## 2020-05-30 ENCOUNTER — Other Ambulatory Visit: Payer: Self-pay

## 2020-05-30 ENCOUNTER — Ambulatory Visit (INDEPENDENT_AMBULATORY_CARE_PROVIDER_SITE_OTHER): Payer: 59 | Admitting: Rehabilitative and Restorative Service Providers"

## 2020-05-30 DIAGNOSIS — R29898 Other symptoms and signs involving the musculoskeletal system: Secondary | ICD-10-CM

## 2020-05-30 DIAGNOSIS — R293 Abnormal posture: Secondary | ICD-10-CM | POA: Diagnosis not present

## 2020-05-30 DIAGNOSIS — M5412 Radiculopathy, cervical region: Secondary | ICD-10-CM | POA: Diagnosis not present

## 2020-05-30 MED FILL — Losartan Potassium Tab 25 MG: ORAL | 90 days supply | Qty: 90 | Fill #0 | Status: AC

## 2020-05-30 NOTE — Therapy (Signed)
[place], [place], [address] Phone: [phone]   Fax:  [phone]  Physical Therapy Evaluation  Patient Details  Name: Whitney Neal MRN: [id number] Date of Birth: Aug 10, 1960 Referring Provider (PT): Dr Emeterio Reeve   Encounter Date: 05/30/2020   PT End of Session - 05/30/20 1706    Visit Number 1    Number of Visits 12    Date for PT Re-Evaluation 07/11/20    PT Start Time 2482    PT Stop Time 1434    PT Time Calculation (min) 49 min    Activity Tolerance Patient tolerated treatment well           Past Medical History:  Diagnosis Date  . 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 Acute on chronic cholecystitis s/p lap cholecystectomy 03/09/2017 03/09/2017  . Atypical chest pain   . Barrett's esophagus    history of.  Normal EGD 2011, 2014  . Cervical lymphadenopathy    left  . Cervical strain   . Constipation   . Delayed bile leak s/p ERCP/stenting 03/14/2017   . Diabetes mellitus without complication ([place])   . GERD (gastroesophageal reflux disease)   . Hepatic abscesses s/p perc drainage 04/04/2017 04/04/2017  . Hypercholesteremia   . Hyperglycemia    a1c 6.3 7/12  . Hyperplastic colonic polyp    history of   . Hypertension   . Migraine   . Osteopenia   . PONV (postoperative nausea and vomiting)   . Ulcer 2010  . Urinary incontinence   . Vitamin D deficiency     Past Surgical History:  Procedure Laterality Date  . ABDOMINAL HYSTERECTOMY  2009   Dr. Bethann Goo - benign disease  . BLADDER SURGERY     bladder mesh surgery 2010- Dr. Rhodia Albright  . BREAST CYST ASPIRATION    . COLONOSCOPY W/ POLYPECTOMY  2010  . ENDOSCOPIC RETROGRADE CHOLANGIOPANCREATOGRAPHY (ERCP) WITH PROPOFOL N/A 05/03/2017   Procedure: ENDOSCOPIC RETROGRADE CHOLANGIOPANCREATOGRAPHY (ERCP) WITH PROPOFOL;  Surgeon: Carol Ada, MD;  Location: [facility];  Service: Endoscopy;   Laterality: N/A;  . ERCP N/A 03/13/2017   Procedure: ENDOSCOPIC RETROGRADE CHOLANGIOPANCREATOGRAPHY (ERCP);  Surgeon: Carol Ada, MD;  Location: [place];  Service: Endoscopy;  Laterality: N/A;  . ERCP N/A 03/14/2017   Procedure: ENDOSCOPIC RETROGRADE CHOLANGIOPANCREATOGRAPHY (ERCP);  Surgeon: Ladene Artist, MD;  Location: [facility];  Service: Endoscopy;  Laterality: N/A;  . IR RADIOLOGIST EVAL & MGMT  04/16/2017  . LAPAROSCOPIC CHOLECYSTECTOMY SINGLE PORT N/A 03/09/2017   Procedure: LAPAROSCOPIC CHOLECYSTECTOMY WITH INTRAOPERATIVE CHOLANGIOGRAM, CLOSURE OF ABERRANT DUCT OF LUSCHKA;  Surgeon: Michael Boston, MD;  Location: [facility];  Service: General;  Laterality: N/A;    There were no vitals filed for this visit.    Subjective Assessment - 05/30/20 1351    Subjective Patient reports onset of pain in the Rt UE ~ 2 weeks ago when she awoke with pain. She has no known injury and no history of neck or shoulder pain.    Pertinent History arthritis; HTN; gall baldder surgery    Patient Stated Goals get rid of the pain    Currently in Pain? Yes    Pain Score 8     Pain Location Arm   upper arm - a little in the neck   Pain Orientation Right    Pain Descriptors / Indicators Sharp;Stabbing    Pain Type Acute pain    Pain Radiating Towards  Rt shoulder to arm/elbow    Pain Onset 1 to 4 weeks ago    Pain Frequency Constant    Aggravating Factors  worse with lying down    Pain Relieving Factors some help with meds - but not a lot; ice; lying on Lt side with pillow under Rt UE or lying on her back              [facility] PT Assessment - 05/30/20 0001      Assessment   Medical Diagnosis Cervical radiculopathy    Referring Provider (PT) Dr Emeterio Reeve    Onset Date/Surgical Date 05/15/20    Hand Dominance Right    Next MD Visit 06/30/20    Prior Therapy for ankle      Precautions   Precautions None      Restrictions   Weight Bearing Restrictions No      Balance Screen   Has  the patient fallen in the past 6 months No    Has the patient had a decrease in activity level because of a fear of falling?  No    Is the patient reluctant to leave their home because of a fear of falling?  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    Leisure household chores; walking ~ 20 min maybe 2X wk sometimes not at all      Observation/Other Assessments   Focus on Therapeutic Outcomes (FOTO)  51      Sensation   Additional Comments Thumb is tingling today      Posture/Postural Control   Posture Comments head forward; shoulders rounded and elevated; scapulae abducted and rotated along the thoracic wall; head of the humerus anterior in orientation      AROM   Cervical Flexion 63    Cervical Extension 30    Cervical - Right Side Bend 33 pain in the Rt arm    Cervical - Left Side Bend 22 pain in the Rt arm    Cervical - Right Rotation 68 mild pain Rt arm    Cervical - Left Rotation 65 pain Rt arm      Strength   Overall Strength Comments WFL's for functional activities      Palpation   Spinal mobility hypomobile thoracic and cervical spine with PA mobs and cervical with lateral Rt to Lt mobs    Palpation comment muscular tightness Rt > Lt scaleni; cervical musculature; upper traps      Special Tests   Other special tests (+) neural tension test                      Objective measurements completed on examination: See above findings.       [facility] Adult PT Treatment/Exercise - 05/30/20 0001      Neck Exercises: Seated   Neck Retraction 5 reps;10 secs      Neck Exercises: Supine   Neck Retraction 5 reps;10 secs      Shoulder Exercises: Stretch   Other Shoulder Stretches doorway stretch  lower position 30 sec x 2 reps      Cryotherapy   Number Minutes Cryotherapy 10 Minutes  Cryotherapy Location Cervical    Type of Cryotherapy Ice pack      Electrical Stimulation   Electrical Stimulation Location cervical; upper trap; Rt shd/arm    Electrical Stimulation Action TENS    Electrical Stimulation Parameters to tolerance    Electrical Stimulation Goals Tone;Pain      Manual Therapy   Manual therapy comments pt supine    Joint Mobilization cervical PA mobs Grade II    Soft tissue mobilization to pt tolerance Rt > Lt in areas of cervical tightness    Manual Traction cervical pull 10-20 sec x 4-5 reps during treatment                  PT Education - 05/30/20 1425    Education Details POC HEP DN    Person(s) Educated Patient    Methods Explanation;Demonstration;Tactile cues;Verbal cues;Handout    Comprehension Verbalized understanding;Returned demonstration;Verbal cues required;Tactile cues required               PT Long Term Goals - 05/30/20 1712      PT LONG TERM GOAL #1   Title Increase AROM cervical lateral flexion by 5-7 deg and rotation by 3-5 deg with no pain with ROM    Time 6    Period Weeks    Status New    Target Date 07/11/20      PT LONG TERM GOAL #2   Title Improve cervical and thoracic posture and alignment with patient to demonstrate increased upright posture with posterior shoulder girdle musculature engaged    Time 6    Period Weeks    Status New    Target Date 07/11/20      PT LONG TERM GOAL #3   Title Decrease pain in the Rt UE to 0-1/10    Time 6    Period Weeks    Status New    Target Date 07/11/20      PT LONG TERM GOAL #4   Title Independent in HEP    Time 6    Period Weeks    Status New    Target Date 07/11/20      PT LONG TERM GOAL #5   Title Improve functional limitation score to 70    Time 6    Period Weeks    Status New    Target Date 07/11/20                  Plan - 05/30/20 1707     Clinical Impression Statement Patient presents with ~ 2 week history of Rt cervical radiculopathy with no known injury. She states that she does sleep with her Rt arm/hand over her head which may contribute to cause of c/o's. Patient has poor posture; limited cervical ROM/mobility; decreased spinal mobility; muscular tightness in the Rt > Lt cervical musculature; (+) neural tension test; pain in Rt UE; decreased functional activity level including difficulty sleeping. Patient will benefit from PT to address problems identified.    Stability/Clinical Decision Making Stable/Uncomplicated    Clinical Decision Making Low    Rehab Potential Good    PT Frequency 2x / week    PT Duration 6 weeks    PT Treatment/Interventions ADLs/Self Care Home Management;Aquatic Therapy;Cryotherapy;Electrical Stimulation;Iontophoresis 39m/ml Dexamethasone;Moist Heat;Traction;Ultrasound;Functional mobility training;Therapeutic activities;Therapeutic exercise;Neuromuscular re-education;Patient/family education;Manual techniques;Passive range of motion;Dry needling;Taping    PT Next Visit Plan review HEP; progress with stretching and upper core strengthening; manual work vs DN Rt cervical musculature; PROM/stretching cervical spine; modalities as indicated(has  TENS unit at home); postural correction    PT Home Exercise Plan [email or web address]    Consulted and Agree with Plan of Care Patient           Patient will benefit from skilled therapeutic intervention in order to improve the following deficits and impairments:  Decreased range of motion,Increased fascial restricitons,Impaired UE functional use,Decreased activity tolerance,Pain,Hypomobility,Impaired flexibility,Improper body mechanics,Decreased mobility,Postural dysfunction  Visit Diagnosis: Radiculopathy, cervical region  Other symptoms and signs involving the musculoskeletal system  Abnormal posture     Problem List Patient Active Problem List   Diagnosis Date  Noted  . Prediabetes 07/01/2018  . Need for shingles vaccine 12/30/2017  . Hepatic abscesses s/p perc drainage 04/04/2017 04/04/2017  . Nausea & vomiting 04/02/2017  . Delayed bile leak s/p ERCP/stenting 03/14/2017   . Acute on chronic cholecystitis s/p lap cholecystectomy 03/09/2017 03/09/2017  . 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 Constipation   . Hemangioma of liver 02/04/2017  . Migraine 10/01/2011  . Numbness and tingling in left hand 01/05/2011  . Hyperglycemia   . Essential hypertension 01/25/2010  . GERD 01/25/2010  . COLONIC POLYPS, HYPERPLASTIC, HX OF 01/25/2010  . CERVICAL STRAIN 11/23/2009  . Hyperlipidemia 09/26/2009  . Osteopenia 07/11/2009  . GASTRIC ULCER, HX OF 07/11/2009  . History of Barrett's esophagus 07/11/2009  . Vitamin D deficiency 07/04/2009    Whitney Neal Whitney Neal PT, MPH  05/30/2020, 5:18 PM  [facility] [place], [place], [address] Phone: [phone]   Fax:  [phone]  Name: Whitney Neal MRN: [id number] Date of Birth: February 16, 1961

## 2020-05-30 NOTE — Patient Instructions (Addendum)
Access Code: [email or web address]: [email or web address]: 04/11/2022Prepared by: Deniz Hannan HoltExercises  Supine Cervical Retraction with Towel - 2 x daily - 7 x weekly - 1 sets - 5-10 reps - 10 sec hold  Seated Cervical Retraction - 2 x daily - 7 x weekly - 1-2 sets - 5-10 reps - 10 sec hold  Doorway Pec Stretch at 60 Elevation - 2 x daily - 7 x weekly - 1 sets - 3 reps - 30 sec hold  Trigger Point Dry Needling  . What is Trigger Point Dry Needling (DN)? o DN is a physical therapy technique used to treat muscle pain and dysfunction. Specifically, DN helps deactivate muscle trigger points (muscle knots).  o A thin filiform needle is used to penetrate the skin and stimulate the underlying trigger point. The goal is for a local twitch response (LTR) to occur and for the trigger point to relax. No medication of any kind is injected during the procedure.   . What Does Trigger Point Dry Needling Feel Like?  o The procedure feels different for each individual patient. Some patients report that they do not actually feel the needle enter the skin and overall the process is not painful. Very mild bleeding may occur. However, many patients feel a deep cramping in the muscle in which the needle was inserted. This is the local twitch response.   Marland Kitchen How Will I feel after the treatment? o Soreness is normal, and the onset of soreness may not occur for a few hours. Typically this soreness does not last longer than two days.  o Bruising is uncommon, however; ice can be used to decrease any possible bruising.  o In rare cases feeling tired or nauseous after the treatment is normal. In addition, your symptoms may get worse before they get better, this period will typically not last longer than 24 hours.   . What Can I do After My Treatment? o Increase your hydration by drinking more water for the next 24 hours. o You may place ice or heat on the areas treated that have become sore, however, do not use heat on  inflamed or bruised areas. Heat often brings more relief post needling. o You can continue your regular activities, but vigorous activity is not recommended initially after the treatment for 24 hours. o DN is best combined with other physical therapy such as strengthening, stretching, and other therapies.

## 2020-05-31 ENCOUNTER — Other Ambulatory Visit (HOSPITAL_BASED_OUTPATIENT_CLINIC_OR_DEPARTMENT_OTHER): Payer: Self-pay

## 2020-06-06 ENCOUNTER — Ambulatory Visit (INDEPENDENT_AMBULATORY_CARE_PROVIDER_SITE_OTHER): Payer: 59 | Admitting: Physical Therapy

## 2020-06-06 ENCOUNTER — Other Ambulatory Visit: Payer: Self-pay

## 2020-06-06 ENCOUNTER — Encounter: Payer: Self-pay | Admitting: Physical Therapy

## 2020-06-06 DIAGNOSIS — M5412 Radiculopathy, cervical region: Secondary | ICD-10-CM

## 2020-06-06 DIAGNOSIS — R29898 Other symptoms and signs involving the musculoskeletal system: Secondary | ICD-10-CM

## 2020-06-06 DIAGNOSIS — R293 Abnormal posture: Secondary | ICD-10-CM | POA: Diagnosis not present

## 2020-06-06 NOTE — Patient Instructions (Signed)
Access Code: [email or web address] URL: [email or web address] Date: 06/06/2020 Prepared by: Almyra Free  Exercises Supine Cervical Retraction with Towel - 2 x daily - 7 x weekly - 1 sets - 5-10 reps - 10 sec hold Seated Cervical Retraction - 2 x daily - 7 x weekly - 1-2 sets - 5-10 reps - 10 sec hold Doorway Pec Stretch at 60 Elevation - 2 x daily - 7 x weekly - 1 sets - 3 reps - 30 sec hold Sidelying Thoracic Rotation with Open Book - 1 x daily - 7 x weekly - 2 sets - 5 reps Thoracic Extension Mobilization on Foam Roll - 2 x daily - 7 x weekly - 5 reps - 2 sets  Patient Education Trigger Point Dry Needling

## 2020-06-06 NOTE — Therapy (Signed)
[place] [employer] [place], [place], [address] Phone: [phone]   Fax:  [phone]  Physical Therapy Treatment  Patient Details  Name: VERSA CRATON MRN: [id number] Date of Birth: [birth year] Referring Provider (PT): Dr Emeterio Reeve   Encounter Date: 06/06/2020   PT End of Session - 06/06/20 0934    Visit Number 2    Number of Visits 12    Date for PT Re-Evaluation 07/11/20    PT Start Time 0935    PT Stop Time 1027    PT Time Calculation (min) 52 min    Activity Tolerance Patient tolerated treatment well    Behavior During Therapy [facility] for tasks assessed/performed           Past Medical History:  Diagnosis Date  . 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 Acute on chronic cholecystitis s/p lap cholecystectomy 03/09/2017 03/09/2017  . Atypical chest pain   . Barrett's esophagus    history of.  Normal EGD 2011, 2014  . Cervical lymphadenopathy    left  . Cervical strain   . Constipation   . Delayed bile leak s/p ERCP/stenting 03/14/2017   . Diabetes mellitus without complication ([place])   . GERD (gastroesophageal reflux disease)   . Hepatic abscesses s/p perc drainage 04/04/2017 04/04/2017  . Hypercholesteremia   . Hyperglycemia    a1c 6.3 7/12  . Hyperplastic colonic polyp    history of   . Hypertension   . Migraine   . Osteopenia   . PONV (postoperative nausea and vomiting)   . Ulcer 2010  . Urinary incontinence   . Vitamin D deficiency     Past Surgical History:  Procedure Laterality Date  . ABDOMINAL HYSTERECTOMY  2009   Dr. Bethann Goo - benign disease  . BLADDER SURGERY     bladder mesh surgery 2010- Dr. Rhodia Albright  . BREAST CYST ASPIRATION    . COLONOSCOPY W/ POLYPECTOMY  2010  . ENDOSCOPIC RETROGRADE CHOLANGIOPANCREATOGRAPHY (ERCP) WITH PROPOFOL N/A 05/03/2017   Procedure: ENDOSCOPIC RETROGRADE CHOLANGIOPANCREATOGRAPHY (ERCP) WITH PROPOFOL;  Surgeon: Carol Ada, MD;  [facility]: [facility];  Service: Endoscopy;  Laterality: N/A;  . ERCP N/A 03/13/2017   Procedure: ENDOSCOPIC RETROGRADE CHOLANGIOPANCREATOGRAPHY (ERCP);  Surgeon: Carol Ada, MD;  Location: [facility];  Service: Endoscopy;  Laterality: N/A;  . ERCP N/A 03/14/2017   Procedure: ENDOSCOPIC RETROGRADE CHOLANGIOPANCREATOGRAPHY (ERCP);  Surgeon: Ladene Artist, MD;  Location: [facility];  Service: Endoscopy;  Laterality: N/A;  . IR RADIOLOGIST EVAL & MGMT  04/16/2017  . LAPAROSCOPIC CHOLECYSTECTOMY SINGLE PORT N/A 03/09/2017   Procedure: LAPAROSCOPIC CHOLECYSTECTOMY WITH INTRAOPERATIVE CHOLANGIOGRAM, CLOSURE OF ABERRANT DUCT OF LUSCHKA;  Surgeon: Michael Boston, MD;  Location: [facility];  Service: General;  Laterality: N/A;    There were no vitals filed for this visit.   Subjective Assessment - 06/06/20 0935    Subjective I'm feeling much better. More in the neck and less in the arm. N/T in right thumb    Pertinent History arthritis; HTN; gall baldder surgery    Patient Stated Goals get rid of the pain    Currently in Pain? Yes    Pain Score 5     Pain Location Arm    Pain Orientation Right    Pain Descriptors / Indicators Sharp    Pain Type Acute pain  [facility] Adult PT Treatment/Exercise - 06/06/20 0001      Neck Exercises: Seated   Neck Retraction Limitations produces sx into arm so stopped    Cervical Rotation Right;Left;5 reps    Cervical Rotation Limitations 5 sec hold; some increased pain right    Lateral Flexion Both;5 reps    Lateral Flexion Limitations 5 sec hold      Neck Exercises: Supine   Neck Retraction 5 reps;10 secs    Other Supine Exercise thoracic ext over foam roll x 5 and also prolonged hold (mid thoracic)      Neck Exercises: Sidelying   Other Sidelying Exercise open book x 5 bil      Shoulder Exercises: Stretch   Other Shoulder Stretches doorway stretch lower position 30 sec x 2 reps      Modalities    Modalities Moist Heat      Moist Heat Therapy   Number Minutes Moist Heat 10 Minutes    Moist Heat Location Cervical      Manual Therapy   Manual Therapy Soft tissue mobilization    Manual therapy comments skilled palpation and monitoring of soft tissues with DN    Joint Mobilization cervical and mid/upper throacic CPA and UPA mobs    Soft tissue mobilization to bil cervical and right UT, right IS            Trigger Point Dry Needling - 06/06/20 0001    Consent Given? Yes    Education Handout Provided Yes    Muscles Treated Head and Neck Upper trapezius;Cervical multifidi    Muscles Treated Upper Quadrant Latissimus dorsi;Infraspinatus    Dry Needling Comments bil    Upper Trapezius Response Twitch reponse elicited;Palpable increased muscle length    Cervical multifidi Response Palpable increased muscle length;Twitch reponse elicited    Infraspinatus Response Twitch response elicited;Palpable increased muscle length    Latissimus dorsi Response Twitch response elicited;Palpable increased muscle length                     PT Long Term Goals - 05/30/20 1712      PT LONG TERM GOAL #1   Title Increase AROM cervical lateral flexion by 5-7 deg and rotation by 3-5 deg with no pain with ROM    Time 6    Period Weeks    Status New    Target Date 07/11/20      PT LONG TERM GOAL #2   Title Improve cervical and thoracic posture and alignment with patient to demonstrate increased upright posture with posterior shoulder girdle musculature engaged    Time 6    Period Weeks    Status New    Target Date 07/11/20      PT LONG TERM GOAL #3   Title Decrease pain in the Rt UE to 0-1/10    Time 6    Period Weeks    Status New    Target Date 07/11/20      PT LONG TERM GOAL #4   Title Independent in HEP    Time 6    Period Weeks    Status New    Target Date 07/11/20      PT LONG TERM GOAL #5   Title Improve functional limitation score to 70    Time 6    Period  Weeks    Status New    Target Date 07/11/20  Plan - 06/06/20 1023    Clinical Impression Statement Patient presented today reporting less sx since eval. Pain is more in right UT/neck area and less into arm although she still feels N/T in her right thumb. Seated chin tucks reproduced sx into right arm so this exercise was held for now. Supine chin tucks are fine. We did intial trial of DN today with good results bil. She has pain and decreased mobility in mid thoracic with CPA mobs. TE issued to address this.    PT Frequency 2x / week    PT Duration 6 weeks    PT Treatment/Interventions ADLs/Self Care Home Management;Aquatic Therapy;Cryotherapy;Electrical Stimulation;Iontophoresis 70m/ml Dexamethasone;Moist Heat;Traction;Ultrasound;Functional mobility training;Therapeutic activities;Therapeutic exercise;Neuromuscular re-education;Patient/family education;Manual techniques;Passive range of motion;Dry needling;Taping    PT Next Visit Plan Assess DN/man response and cont as indicated; progress with stretching and upper core strengthening; PROM/stretching cervical spine; modalities as indicated(has TENS unit at home); postural correction; thoracic mobility    PT Home Exercise Plan [email or web address]           Patient will benefit from skilled therapeutic intervention in order to improve the following deficits and impairments:  Decreased range of motion,Increased fascial restricitons,Impaired UE functional use,Decreased activity tolerance,Pain,Hypomobility,Impaired flexibility,Improper body mechanics,Decreased mobility,Postural dysfunction  Visit Diagnosis: Radiculopathy, cervical region  Other symptoms and signs involving the musculoskeletal system  Abnormal posture     Problem List Patient Active Problem List   Diagnosis Date Noted  . Prediabetes 07/01/2018  . Need for shingles vaccine 12/30/2017  . Hepatic abscesses s/p perc drainage 04/04/2017 04/04/2017  . Nausea &  vomiting 04/02/2017  . Delayed bile leak s/p ERCP/stenting 03/14/2017   . Acute on chronic cholecystitis s/p lap cholecystectomy 03/09/2017 03/09/2017  . 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 Constipation   . Hemangioma of liver 02/04/2017  . Migraine 10/01/2011  . Numbness and tingling in left hand 01/05/2011  . Hyperglycemia   . Essential hypertension 01/25/2010  . GERD 01/25/2010  . COLONIC POLYPS, HYPERPLASTIC, HX OF 01/25/2010  . CERVICAL STRAIN 11/23/2009  . Hyperlipidemia 09/26/2009  . Osteopenia 07/11/2009  . GASTRIC ULCER, HX OF 07/11/2009  . History of Barrett's esophagus 07/11/2009  . Vitamin D deficiency 07/04/2009    JMadelyn FlavorsPT 06/06/2020, 10:43 AM  [facility] [address]Phone: [phone]  Fax:  [phone] Name: JSUN KIHNMRN: [id number] of Birth: 310/18/1962

## 2020-06-10 ENCOUNTER — Other Ambulatory Visit: Payer: Self-pay

## 2020-06-10 ENCOUNTER — Ambulatory Visit (INDEPENDENT_AMBULATORY_CARE_PROVIDER_SITE_OTHER): Payer: 59 | Admitting: Rehabilitative and Restorative Service Providers"

## 2020-06-10 ENCOUNTER — Encounter: Payer: Self-pay | Admitting: Rehabilitative and Restorative Service Providers"

## 2020-06-10 DIAGNOSIS — M5412 Radiculopathy, cervical region: Secondary | ICD-10-CM | POA: Diagnosis not present

## 2020-06-10 DIAGNOSIS — R29898 Other symptoms and signs involving the musculoskeletal system: Secondary | ICD-10-CM | POA: Diagnosis not present

## 2020-06-10 DIAGNOSIS — R293 Abnormal posture: Secondary | ICD-10-CM

## 2020-06-10 NOTE — Patient Instructions (Signed)
Access Code: [email or web address]: [email or web address]: 04/22/2022Prepared by: Sadik Piascik HoltExercises  Supine Cervical Retraction with Towel - 2 x daily - 7 x weekly - 1 sets - 5-10 reps - 10 sec hold  Seated Cervical Retraction - 2 x daily - 7 x weekly - 1-2 sets - 5-10 reps - 10 sec hold  Sidelying Thoracic Rotation with Open Book - 1 x daily - 7 x weekly - 2 sets - 5 reps  Thoracic Extension Mobilization on Foam Roll - 2 x daily - 7 x weekly - 5 reps - 2 sets  Doorway Pec Stretch at 60 Degrees Abduction - 3 x daily - 7 x weekly - 3 reps - 1 sets  Doorway Pec Stretch at 90 Degrees Abduction - 3 x daily - 7 x weekly - 3 reps - 1 sets - 30 seconds hold  Doorway Pec Stretch at 120 Degrees Abduction - 3 x daily - 7 x weekly - 3 reps - 1 sets - 30 second hold hold  Shoulder External Rotation and Scapular Retraction with Resistance - 2 x daily - 7 x weekly - 1 sets - 10 reps - 3-5 sec hold  Standing Bilateral Low Shoulder Row with Anchored Resistance - 2 x daily - 7 x weekly - 1-3 sets - 10 reps - 2-3 sec hold  Shoulder Extension with Resistance - 2 x daily - 7 x weekly - 1-3 sets - 10 reps - 2-3 sec hold

## 2020-06-10 NOTE — Therapy (Signed)
[place], [place], [address] Phone: [phone]   Fax:  [phone]  Physical Therapy Treatment  Patient Details  Name: Whitney Neal MRN: [id number] Date of Birth: 1960-09-28 Referring Provider (PT): Dr Emeterio Reeve   Encounter Date: 06/10/2020   PT End of Session - 06/10/20 0931    Visit Number 3    Number of Visits 12    Date for PT Re-Evaluation 07/11/20    PT Start Time 0930    PT Stop Time 1018    PT Time Calculation (min) 48 min    Activity Tolerance Patient tolerated treatment well           Past Medical History:  Diagnosis Date  . 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 Acute on chronic cholecystitis s/p lap cholecystectomy 03/09/2017 03/09/2017  . Atypical chest pain   . Barrett's esophagus    history of.  Normal EGD 2011, 2014  . Cervical lymphadenopathy    left  . Cervical strain   . Constipation   . Delayed bile leak s/p ERCP/stenting 03/14/2017   . Diabetes mellitus without complication ([place])   . GERD (gastroesophageal reflux disease)   . Hepatic abscesses s/p perc drainage 04/04/2017 04/04/2017  . Hypercholesteremia   . Hyperglycemia    a1c 6.3 7/12  . Hyperplastic colonic polyp    history of   . Hypertension   . Migraine   . Osteopenia   . PONV (postoperative nausea and vomiting)   . Ulcer 2010  . Urinary incontinence   . Vitamin D deficiency     Past Surgical History:  Procedure Laterality Date  . ABDOMINAL HYSTERECTOMY  2009   Dr. Bethann Goo - benign disease  . BLADDER SURGERY     bladder mesh surgery 2010- Dr. Rhodia Albright  . BREAST CYST ASPIRATION    . COLONOSCOPY W/ POLYPECTOMY  2010  . ENDOSCOPIC RETROGRADE CHOLANGIOPANCREATOGRAPHY (ERCP) WITH PROPOFOL N/A 05/03/2017   Procedure: ENDOSCOPIC RETROGRADE CHOLANGIOPANCREATOGRAPHY (ERCP) WITH PROPOFOL;  Surgeon: Carol Ada, MD;  [facility];  Service: Endoscopy;   Laterality: N/A;  . ERCP N/A 03/13/2017   Procedure: ENDOSCOPIC RETROGRADE CHOLANGIOPANCREATOGRAPHY (ERCP);  Surgeon: Carol Ada, MD;  Location: [facility];  Service: Endoscopy;  Laterality: N/A;  . ERCP N/A 03/14/2017   Procedure: ENDOSCOPIC RETROGRADE CHOLANGIOPANCREATOGRAPHY (ERCP);  Surgeon: Ladene Artist, MD;  [facility];  Service: Endoscopy;  Laterality: N/A;  . IR RADIOLOGIST EVAL & MGMT  04/16/2017  . LAPAROSCOPIC CHOLECYSTECTOMY SINGLE PORT N/A 03/09/2017   Procedure: LAPAROSCOPIC CHOLECYSTECTOMY WITH INTRAOPERATIVE CHOLANGIOGRAM, CLOSURE OF ABERRANT DUCT OF LUSCHKA;  Surgeon: Michael Boston, MD;  [facility];  Service: General;  Laterality: N/A;    There were no vitals filed for this visit.   Subjective Assessment - 06/10/20 0932    Subjective Patient reports that she is doing much better. She has no radicular pain into Rt UE. Working on her exercises.    Currently in Pain? No/denies    Pain Score 0-No pain              [facility] PT Assessment - 06/10/20 0001      Assessment   Medical Diagnosis Cervical radiculopathy    Referring Provider (PT) Dr Emeterio Reeve    Onset Date/Surgical Date 05/15/20    Hand Dominance Right    Next MD Visit 06/30/20    Prior Therapy for ankle      Sensation  Additional Comments no radicular symptoms      Palpation   Palpation comment decreased muscular tightness Rt > Lt scaleni; cervical musculature; upper traps                         [facility] Adult PT Treatment/Exercise - 06/10/20 0001      Neck Exercises: Supine   Neck Retraction 5 reps;10 secs    Other Supine Exercise thoracic ext over foam roll prolonged hold (mid thoracic)      Shoulder Exercises: Standing   Extension Strengthening;Both;10 reps;Theraband    Theraband Level (Shoulder Extension) Level 4 (Blue)    Row Strengthening;Both;Theraband;10 reps    Theraband Level (Shoulder Row) Level 4 (Blue)    Retraction Strengthening;Both;10  reps;Theraband    Theraband Level (Shoulder Retraction) Level 2 (Red)      Shoulder Exercises: ROM/Strengthening   UBE (Upper Arm Bike) L4 x 4 min alt fwd/back each minute      Shoulder Exercises: Stretch   Other Shoulder Stretches doorway stretch three positions 30 sec x 2 reps    Other Shoulder Stretches prolonged snow angel ~ 2 min foam roll along spine      Modalities   Modalities Moist Heat;Electrical Stimulation      Moist Heat Therapy   Number Minutes Moist Heat 10 Minutes    Moist Heat Location Cervical;Other (comment)   thoracic     Electrical Stimulation   Electrical Stimulation Location cervical; upper trap bilat    Electrical Stimulation Action TENS    Electrical Stimulation Parameters to tolerance    Electrical Stimulation Goals Pain;Tone      Manual Therapy   Joint Mobilization cervical and mid/upper throacic CPA and UPA mobs    Soft tissue mobilization cervical and thoracic musculature inc upper trap/leveator                  PT Education - 06/10/20 0948    Education Details HEP    Person(s) Educated Patient    Methods Explanation;Demonstration;Tactile cues;Verbal cues;Handout    Comprehension Verbalized understanding;Returned demonstration;Verbal cues required;Tactile cues required               PT Long Term Goals - 05/30/20 1712      PT LONG TERM GOAL #1   Title Increase AROM cervical lateral flexion by 5-7 deg and rotation by 3-5 deg with no pain with ROM    Time 6    Period Weeks    Status New    Target Date 07/11/20      PT LONG TERM GOAL #2   Title Improve cervical and thoracic posture and alignment with patient to demonstrate increased upright posture with posterior shoulder girdle musculature engaged    Time 6    Period Weeks    Status New    Target Date 07/11/20      PT LONG TERM GOAL #3   Title Decrease pain in the Rt UE to 0-1/10    Time 6    Period Weeks    Status New    Target Date 07/11/20      PT LONG TERM GOAL #4    Title Independent in HEP    Time 6    Period Weeks    Status New    Target Date 07/11/20      PT LONG TERM GOAL #5   Title Improve functional limitation score to 70    Time 6    Period Weeks  Status New    Target Date 07/11/20                 Plan - 06/10/20 0934    Clinical Impression Statement Excellent progress with therapy. Patient has no radicular symptoms and minimal cervical pain. She has some discomfort or pain when she is sleeping but that resolves with changing positions. Good response to DN and manual work. Added posterior shoulder girdle strengthening without difficulty. Note decreased muscular tightness to palpation through the cervical and thoracic area. Progressing well toward stated goals of therapy.    Rehab Potential Good    PT Frequency 2x / week    PT Duration 6 weeks    PT Treatment/Interventions ADLs/Self Care Home Management;Aquatic Therapy;Cryotherapy;Electrical Stimulation;Iontophoresis 5m/ml Dexamethasone;Moist Heat;Traction;Ultrasound;Functional mobility training;Therapeutic activities;Therapeutic exercise;Neuromuscular re-education;Patient/family education;Manual techniques;Passive range of motion;Dry needling;Taping    PT Next Visit Plan Continue DN/man response and cont as indicated; progress with stretching and upper core strengthening; PROM/stretching cervical spine; modalities as indicated(has TENS unit at home); postural correction; thoracic mobility    PT Home Exercise Plan [email or web address]    Consulted and Agree with Plan of Care Patient           Patient will benefit from skilled therapeutic intervention in order to improve the following deficits and impairments:     Visit Diagnosis: Radiculopathy, cervical region  Other symptoms and signs involving the musculoskeletal system  Abnormal posture     Problem List Patient Active Problem List   Diagnosis Date Noted  . Prediabetes 07/01/2018  . Need for shingles vaccine 12/30/2017  .  Hepatic abscesses s/p perc drainage 04/04/2017 04/04/2017  . Nausea & vomiting 04/02/2017  . Delayed bile leak s/p ERCP/stenting 03/14/2017   . Acute on chronic cholecystitis s/p lap cholecystectomy 03/09/2017 03/09/2017  . 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 Constipation   . Hemangioma of liver 02/04/2017  . Migraine 10/01/2011  . Numbness and tingling in left hand 01/05/2011  . Hyperglycemia   . Essential hypertension 01/25/2010  . GERD 01/25/2010  . COLONIC POLYPS, HYPERPLASTIC, HX OF 01/25/2010  . CERVICAL STRAIN 11/23/2009  . Hyperlipidemia 09/26/2009  . Osteopenia 07/11/2009  . GASTRIC ULCER, HX OF 07/11/2009  . History of Barrett's esophagus 07/11/2009  . Vitamin D deficiency 07/04/2009    Lynessa Almanzar PNilda SimmerPT, MPH  06/10/2020, 10:18 AM  [facility] [place] [place] [address]Phone: [phone]  Fax:  [phone] Name: JDEVANEE POMPLUNMRN: [id number] of Birth: [birth year]

## 2020-06-13 ENCOUNTER — Encounter: Payer: Self-pay | Admitting: Rehabilitative and Restorative Service Providers"

## 2020-06-13 ENCOUNTER — Ambulatory Visit (INDEPENDENT_AMBULATORY_CARE_PROVIDER_SITE_OTHER): Payer: 59 | Admitting: Rehabilitative and Restorative Service Providers"

## 2020-06-13 ENCOUNTER — Other Ambulatory Visit: Payer: Self-pay

## 2020-06-13 DIAGNOSIS — R293 Abnormal posture: Secondary | ICD-10-CM | POA: Diagnosis not present

## 2020-06-13 DIAGNOSIS — M5412 Radiculopathy, cervical region: Secondary | ICD-10-CM | POA: Diagnosis not present

## 2020-06-13 DIAGNOSIS — R29898 Other symptoms and signs involving the musculoskeletal system: Secondary | ICD-10-CM

## 2020-06-13 NOTE — Therapy (Signed)
[place]  [place], [place], [address] Phone: [phone]   Fax:  [phone]  Physical Therapy Treatment  Patient Details  Name: Whitney Neal MRN: [id number] Date of Birth: May 10, 1960 Referring Provider (PT): Dr Emeterio Reeve   Encounter Date: 06/13/2020   PT End of Session - 06/13/20 1056    Visit Number 4    Number of Visits 12    Date for PT Re-Evaluation 07/11/20    PT Start Time 1056    PT Stop Time 1138   cold pack x 10 min end of treatment   PT Time Calculation (min) 42 min    Activity Tolerance Patient tolerated treatment well           Past Medical History:  Diagnosis Date  . 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 Acute on chronic cholecystitis s/p lap cholecystectomy 03/09/2017 03/09/2017  . Atypical chest pain   . Barrett's esophagus    history of.  Normal EGD 2011, 2014  . Cervical lymphadenopathy    left  . Cervical strain   . Constipation   . Delayed bile leak s/p ERCP/stenting 03/14/2017   . Diabetes mellitus without complication ([place])   . GERD (gastroesophageal reflux disease)   . Hepatic abscesses s/p perc drainage 04/04/2017 04/04/2017  . Hypercholesteremia   . Hyperglycemia    a1c 6.3 7/12  . Hyperplastic colonic polyp    history of   . Hypertension   . Migraine   . Osteopenia   . PONV (postoperative nausea and vomiting)   . Ulcer 2010  . Urinary incontinence   . Vitamin D deficiency     Past Surgical History:  Procedure Laterality Date  . ABDOMINAL HYSTERECTOMY  2009   Dr. Bethann Goo - benign disease  . BLADDER SURGERY     bladder mesh surgery 2010- Dr. Rhodia Albright  . BREAST CYST ASPIRATION    . COLONOSCOPY W/ POLYPECTOMY  2010  . ENDOSCOPIC RETROGRADE CHOLANGIOPANCREATOGRAPHY (ERCP) WITH PROPOFOL N/A 05/03/2017   Procedure: ENDOSCOPIC RETROGRADE CHOLANGIOPANCREATOGRAPHY (ERCP) WITH PROPOFOL;  Surgeon: Carol Ada, MD;  Location:  [facility];  Service: Endoscopy;  Laterality: N/A;  . ERCP N/A 03/13/2017   Procedure: ENDOSCOPIC RETROGRADE CHOLANGIOPANCREATOGRAPHY (ERCP);  Surgeon: Carol Ada, MD;  Location: [facility];  Service: Endoscopy;  Laterality: N/A;  . ERCP N/A 03/14/2017   Procedure: ENDOSCOPIC RETROGRADE CHOLANGIOPANCREATOGRAPHY (ERCP);  Surgeon: Ladene Artist, MD;  Location: [facility];  Service: Endoscopy;  Laterality: N/A;  . IR RADIOLOGIST EVAL & MGMT  04/16/2017  . LAPAROSCOPIC CHOLECYSTECTOMY SINGLE PORT N/A 03/09/2017   Procedure: LAPAROSCOPIC CHOLECYSTECTOMY WITH INTRAOPERATIVE CHOLANGIOGRAM, CLOSURE OF ABERRANT DUCT OF LUSCHKA;  Surgeon: Michael Boston, MD;  Location: [facility];  Service: General;  Laterality: N/A;    There were no vitals filed for this visit.   Subjective Assessment - 06/13/20 1057    Subjective Doing well. No radicular pain. Neck feels good - a little sore sometimes with the way she is sleeping. Feels confident in continuing with her HEP independently.    Currently in Pain? No/denies    Pain Score 0-No pain              [facility] PT Assessment - 06/13/20 0001      Assessment   Medical Diagnosis Cervical radiculopathy    Referring Provider (PT) Dr Emeterio Reeve    Onset Date/Surgical Date 05/15/20    Hand Dominance Right    Next MD Visit  06/30/20    Prior Therapy for ankle      AROM   Overall AROM Comments no pain in the neck or UE's with cervical ROM    Right/Left Shoulder --   WFL's bilat   Cervical Flexion 70    Cervical Extension 51    Cervical - Right Side Bend 46    Cervical - Left Side Bend 41    Cervical - Right Rotation 68    Cervical - Left Rotation 74      Strength   Overall Strength Comments WFL's for functional activities      Palpation   Spinal mobility WFL's    Palpation comment decreased muscular tightness Rt > Lt scaleni; cervical musculature; upper traps                         [facility] Adult PT Treatment/Exercise - 06/13/20  0001      Exercises   Exercises Other Exercises    Other Exercises  reviewed HEP      Shoulder Exercises: ROM/Strengthening   UBE (Upper Arm Bike) L4 x 4 min alt fwd/back each minute      Shoulder Exercises: Stretch   Other Shoulder Stretches doorway stretch three positions 30 sec x 2 reps    Other Shoulder Stretches prolonged snow angel ~ 2 min foam roll along spine      Cryotherapy   Number Minutes Cryotherapy 10 Minutes    Cryotherapy Location Cervical    Type of Cryotherapy Ice pack      Manual Therapy   Manual therapy comments pt supine    Joint Mobilization cervical CPA and UPA mobs    Soft tissue mobilization cervical musculature inc upper trap/leveator; ant/lat/post cervical musculature    Passive ROM PROM - cervical flexion; flexion with slight rotation 10-15 sec hold x 2 reps each position    Manual Traction cervical pull 10-20 sec x 4-5 reps during treatment                       PT Long Term Goals - 06/13/20 1126      PT LONG TERM GOAL #1   Title Increase AROM cervical lateral flexion by 5-7 deg and rotation by 3-5 deg with no pain with ROM    Time 6    Period Weeks    Status Achieved      PT LONG TERM GOAL #2   Title Improve cervical and thoracic posture and alignment with patient to demonstrate increased upright posture with posterior shoulder girdle musculature engaged    Time 6    Period Weeks    Status Achieved      PT LONG TERM GOAL #3   Title Decrease pain in the Rt UE to 0-1/10    Time 6    Period Weeks    Status Achieved      PT LONG TERM GOAL #4   Title Independent in HEP    Time 6    Period Weeks    Status Achieved      PT LONG TERM GOAL #5   Title Improve functional limitation score to 70    Time 6    Period Weeks    Status Achieved                 Plan - 06/13/20 1127    Clinical Impression Statement Patient reports that she is now symptom free with no pain in the  UE's of cervical spinbe. She will awaken at  times with stiffness in her neck but this resolves with stretching. Patient demonstrates good gains in cervical ROM with decrease in palpable tightness through the Rt > Lt cervical musculature. She is independent in her HEP. Goals of therapy have been accomplished. Patient will be discharged to independent HEP and call with any questioins or problems.    Rehab Potential Good    PT Frequency 2x / week    PT Duration 6 weeks    PT Treatment/Interventions ADLs/Self Care Home Management;Aquatic Therapy;Cryotherapy;Electrical Stimulation;Iontophoresis 79m/ml Dexamethasone;Moist Heat;Traction;Ultrasound;Functional mobility training;Therapeutic activities;Therapeutic exercise;Neuromuscular re-education;Patient/family education;Manual techniques;Passive range of motion;Dry needling;Taping    PT Next Visit Plan D/C to independent HEP    PT Home Exercise Plan [id number]    Consulted and Agree with Plan of Care Patient           Patient will benefit from skilled therapeutic intervention in order to improve the following deficits and impairments:     Visit Diagnosis: Radiculopathy, cervical region  Other symptoms and signs involving the musculoskeletal system  Abnormal posture     Problem List Patient Active Problem List   Diagnosis Date Noted  . Prediabetes 07/01/2018  . Need for shingles vaccine 12/30/2017  . Hepatic abscesses s/p perc drainage 04/04/2017 04/04/2017  . Nausea & vomiting 04/02/2017  . Delayed bile leak s/p ERCP/stenting 03/14/2017   . Acute on chronic cholecystitis s/p lap cholecystectomy 03/09/2017 03/09/2017  . 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 Constipation   . Hemangioma of liver 02/04/2017  . Migraine 10/01/2011  . Numbness and tingling in left hand 01/05/2011  . Hyperglycemia   . Essential hypertension 01/25/2010  . GERD 01/25/2010  . COLONIC POLYPS, HYPERPLASTIC, HX OF 01/25/2010  . CERVICAL STRAIN 11/23/2009  .  Hyperlipidemia 09/26/2009  . Osteopenia 07/11/2009  . GASTRIC ULCER, HX OF 07/11/2009  . History of Barrett's esophagus 07/11/2009  . Vitamin D deficiency 07/04/2009    Mykai Wendorf PNilda SimmerPT, MPH  06/13/2020, 11:33 AM  [facility] [address]Phone: [phone]  Fax:  [phone] Name: Whitney KIMBLERMRN: [id number] of Birth: [birth year] PHYSICAL THERAPY DISCHARGE SUMMARY  Visits from Start of Care: 4  Current functional level related to goals / functional outcomes: See progress note for discharge status    Remaining deficits: Needs to continue with independent HEP    Education / Equipment: HEP   Plan: Patient agrees to discharge.  Patient goals were met. Patient is being discharged due to meeting the stated rehab goals.  ?????    Misheel Gowans P. HHelene KelpPT, MPH 06/13/20 11:33 AM

## 2020-06-17 ENCOUNTER — Encounter: Payer: 59 | Admitting: Rehabilitative and Restorative Service Providers"

## 2020-06-21 DIAGNOSIS — Z0000 Encounter for general adult medical examination without abnormal findings: Secondary | ICD-10-CM | POA: Diagnosis not present

## 2020-06-21 DIAGNOSIS — E7800 Pure hypercholesterolemia, unspecified: Secondary | ICD-10-CM | POA: Diagnosis not present

## 2020-06-21 DIAGNOSIS — I10 Essential (primary) hypertension: Secondary | ICD-10-CM | POA: Diagnosis not present

## 2020-06-21 DIAGNOSIS — R7303 Prediabetes: Secondary | ICD-10-CM | POA: Diagnosis not present

## 2020-06-22 LAB — COMPLETE METABOLIC PANEL WITH GFR
AG Ratio: 1.8 (calc) (ref 1.0–2.5)
ALT: 8 U/L (ref 6–29)
AST: 13 U/L (ref 10–35)
Albumin: 4.4 g/dL (ref 3.6–5.1)
Alkaline phosphatase (APISO): 74 U/L (ref 37–153)
BUN: 17 mg/dL (ref 7–25)
CO2: 27 mmol/L (ref 20–32)
Calcium: 9.7 mg/dL (ref 8.6–10.4)
Chloride: 108 mmol/L (ref 98–110)
Creat: 0.59 mg/dL (ref 0.50–0.99)
GFR, Est African American: 114 mL/min/{1.73_m2} (ref >=60)
GFR, Est Non African American: 98 mL/min/{1.73_m2} (ref >=60)
Globulin: 2.4 g/dL (calc) (ref 1.9–3.7)
Glucose, Bld: 96 mg/dL (ref 65–99)
Potassium: 4.4 mmol/L (ref 3.5–5.3)
Sodium: 141 mmol/L (ref 135–146)
Total Bilirubin: 0.4 mg/dL (ref 0.2–1.2)
Total Protein: 6.8 g/dL (ref 6.1–8.1)

## 2020-06-22 LAB — HEMOGLOBIN A1C
Hgb A1c MFr Bld: 6 % of total Hgb — ABNORMAL HIGH (ref <5.7)
Mean Plasma Glucose: 126 mg/dL
eAG (mmol/L): 7 mmol/L

## 2020-06-22 LAB — CBC
HCT: 40.8 % (ref 35.0–45.0)
Hemoglobin: 13.6 g/dL (ref 11.7–15.5)
MCH: 29.1 pg (ref 27.0–33.0)
MCHC: 33.3 g/dL (ref 32.0–36.0)
MCV: 87.2 fL (ref 80.0–100.0)
MPV: 11.2 fL (ref 7.5–12.5)
Platelets: 305 10*3/uL (ref 140–400)
RBC: 4.68 10*6/uL (ref 3.80–5.10)
RDW: 13 % (ref 11.0–15.0)
WBC: 4.3 10*3/uL (ref 3.8–10.8)

## 2020-06-22 LAB — LIPID PANEL
Cholesterol: 177 mg/dL (ref <200)
HDL: 60 mg/dL (ref >=50)
LDL Cholesterol (Calc): 98 mg/dL (calc)
Non-HDL Cholesterol (Calc): 117 mg/dL (calc) (ref <130)
Total CHOL/HDL Ratio: 3 (calc) (ref <5.0)
Triglycerides: 99 mg/dL (ref <150)

## 2020-06-30 ENCOUNTER — Encounter: Payer: Self-pay | Admitting: Osteopathic Medicine

## 2020-06-30 ENCOUNTER — Ambulatory Visit (INDEPENDENT_AMBULATORY_CARE_PROVIDER_SITE_OTHER): Payer: 59 | Admitting: Osteopathic Medicine

## 2020-06-30 ENCOUNTER — Other Ambulatory Visit: Payer: Self-pay

## 2020-06-30 VITALS — BP 114/80 | HR 88 | Temp 98.7°F | Wt 147.0 lb

## 2020-06-30 DIAGNOSIS — Z0000 Encounter for general adult medical examination without abnormal findings: Secondary | ICD-10-CM | POA: Diagnosis not present

## 2020-06-30 DIAGNOSIS — Z23 Encounter for immunization: Secondary | ICD-10-CM | POA: Diagnosis not present

## 2020-06-30 DIAGNOSIS — Z66 Do not resuscitate: Secondary | ICD-10-CM

## 2020-06-30 NOTE — Patient Instructions (Addendum)
General Preventive Care  Most recent routine screening labs: see attached.   Blood pressure goal 130/80 or less.   Tobacco: don't!   Alcohol: responsible moderation is ok for most adults - if you have concerns about your alcohol intake, please talk to me!   Exercise: as tolerated to reduce risk of cardiovascular disease and diabetes. Strength training will also prevent osteoporosis.   Mental health: if need for mental health care (medicines, counseling, other), or concerns about moods, please let me know!   Sexual / Reproductive health: if need for STD testing, or if concerns with libido/pain problems, please let me know!  Advanced Directive: Living Will and/or Healthcare Power of Attorney recommended for all adults, regardless of age or health.  Vaccines  Flu vaccine: for almost everyone, every fall.   Shingles vaccine: after age 72.   Pneumonia vaccines: after age 45.  Tetanus booster: every 10 years - due 2030  COVID vaccine: THANKS for getting your vaccine! :)  Cancer screenings   Colon cancer screening: due! Colonoscopy or Cologuard stool test   Breast cancer screening: mammogram due!  Cervical cancer screening: Pap not needed w/ hysterectomy.   Lung cancer screening: not needed for non-smokers  Infection screenings  . HIV: recommended screening at least once age 61-65 . Gonorrhea/Chlamydia: screening as needed . Hepatitis C: recommended once for everyone age 64-75 . TB: certain at-risk populations, or depending on work requirements and/or travel history Other . Bone Density Test: recommended for women at age 55

## 2020-06-30 NOTE — Progress Notes (Signed)
Whitney Neal is a 60 y.o. female who presents to  [place] at [facility]  today, 06/30/20, seeking care for the following:  . Annual physical      ASSESSMENT & PLAN with other pertinent findings:  The primary encounter diagnosis was Annual physical exam. Diagnoses of Need for shingles vaccine and DNR (do not resuscitate) - DNR and MOST form completed 06/30/20 w/ PCP Dr Sheppard Coil, patient has capacity to make informed decisions about these matters  were also pertinent to this visit.   Pt reports mammo recently done, awaiting results   Patient Instructions  General Preventive Care  Most recent routine screening labs: see attached.   Blood pressure goal 130/80 or less.   Tobacco: don't!   Alcohol: responsible moderation is ok for most adults - if you have concerns about your alcohol intake, please talk to me!   Exercise: as tolerated to reduce risk of cardiovascular disease and diabetes. Strength training will also prevent osteoporosis.   Mental health: if need for mental health care (medicines, counseling, other), or concerns about moods, please let me know!   Sexual / Reproductive health: if need for STD testing, or if concerns with libido/pain problems, please let me know!  Advanced Directive: Living Will and/or Healthcare Power of Attorney recommended for all adults, regardless of age or health.  Vaccines  Flu vaccine: for almost everyone, every fall.   Shingles vaccine: after age 42.   Pneumonia vaccines: after age 56.  Tetanus booster: every 10 years - due 2030  COVID vaccine: THANKS for getting your vaccine! :)  Cancer screenings   Colon cancer screening: due! Colonoscopy or Cologuard stool test   Breast cancer screening: mammogram due!  Cervical cancer screening: Pap not needed w/ hysterectomy.   Lung cancer screening: not needed for non-smokers  Infection screenings  . HIV: recommended screening at least  once age 49-65 . Gonorrhea/Chlamydia: screening as needed . Hepatitis C: recommended once for everyone age 89-75 . TB: certain at-risk populations, or depending on work requirements and/or travel history Other . Bone Density Test: recommended for women at age 82   Orders Placed This Encounter  Procedures  . Varicella-zoster vaccine IM (Shingrix)    No orders of the defined types were placed in this encounter.    See below for relevant physical exam findings  See below for recent lab and imaging results reviewed  Medications, allergies, PMH, PSH, SocH, [place] reviewed below    Follow-up instructions: Return in about 6 months (around 12/31/2020) for IN-OFFICE VISIT MONITOR A1C, BP. SEE [place] SOONER IF NEEDED. CALL/MESSAGE W/ QUESTIONS.                                        Exam:  BP 114/80 (BP Location: Left Arm, Patient Position: Sitting, Cuff Size: Normal)   Pulse 88   Temp 98.7 F (37.1 C) (Oral)   Wt 147 lb 0.6 oz (66.7 kg)   BMI 27.78 kg/m   Constitutional: VS see above. General Appearance: alert, well-developed, well-nourished, NAD  Neck: No masses, trachea midline.   Respiratory: Normal respiratory effort. no wheeze, no rhonchi, no rales  Cardiovascular: S1/S2 normal, no murmur, no rub/gallop auscultated. RRR.   Musculoskeletal: Gait normal. Symmetric and independent movement of all extremities  Abdominal: non-tender, non-distended, no appreciable organomegaly, neg Murphy's, BS WNLx4  Neurological: Normal balance/coordination. No tremor.  Skin:  warm, dry, intact.   Psychiatric: Normal judgment/insight. Normal mood and affect. Oriented x3.   Current Meds  Medication Sig  . aspirin 81 MG EC tablet Take 81 mg by mouth daily.  Marland Kitchen atorvastatin (LIPITOR) 40 MG tablet TAKE 1 TABLET (40 MG TOTAL) BY MOUTH DAILY.  Marland Kitchen COVID-19 mRNA vaccine, Pfizer, 30 MCG/0.3ML injection INJECT AS DIRECTED  . cyclobenzaprine (FLEXERIL) 10 MG tablet  Take 1/2-1 tablets (5-10 mg total) by mouth 3 (three) times daily as needed for muscle spasms. Caution: can cause drowsiness  . dicyclomine (BENTYL) 20 MG tablet Take 1 tablet (20 mg total) by mouth 4 (four) times daily as needed for spasms.  . fluticasone (FLONASE) 50 MCG/ACT nasal spray Place 1 spray into both nostrils daily as needed for allergies or rhinitis.  . hydrochlorothiazide (HYDRODIURIL) 12.5 MG tablet Take 1 tablet (12.5 mg total) by mouth daily. Will need labs around 06/04/2019 - orders are in  . losartan (COZAAR) 25 MG tablet TAKE 1 TABLET (25 MG) BY MOUTH DAILY.  . metFORMIN (GLUCOPHAGE-XR) 500 MG 24 hr tablet TAKE 1 TABLET (500 MG TOTAL) BY MOUTH DAILY WITH BREAKFAST.  . Multiple Vitamins-Minerals (MULTIVITAMIN PO) Take 1 tablet by mouth daily.  Marland Kitchen omeprazole (PRILOSEC) 40 MG capsule TAKE 1 CAPSULE (40 MG TOTAL) BY MOUTH DAILY.  Marland Kitchen ondansetron (ZOFRAN-ODT) 8 MG disintegrating tablet Take 1 tablet (8 mg total) by mouth every 8 (eight) hours as needed for nausea.    Allergies  Allergen Reactions  . Codeine Other (See Comments)    REACTION: headache, visual disturbance  . Morphine Other (See Comments)    Unknown  . Morphine And Related Other (See Comments)    Unknown  . Other Rash    Ceftriaxone Sodium In Dextrose  . Rocephin [Ceftriaxone Sodium In Dextrose] Rash    Patient Active Problem List   Diagnosis Date Noted  . Prediabetes 07/01/2018  . Need for shingles vaccine 12/30/2017  . Hepatic abscesses s/p perc drainage 04/04/2017 04/04/2017  . Nausea & vomiting 04/02/2017  . Delayed bile leak s/p ERCP/stenting 03/14/2017   . Acute on chronic cholecystitis s/p lap cholecystectomy 03/09/2017 03/09/2017  . 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 Constipation   . Hemangioma of liver 02/04/2017  . Migraine 10/01/2011  . Numbness and tingling in left hand 01/05/2011  . Hyperglycemia   . Essential hypertension 01/25/2010  . GERD  01/25/2010  . COLONIC POLYPS, HYPERPLASTIC, HX OF 01/25/2010  . CERVICAL STRAIN 11/23/2009  . Hyperlipidemia 09/26/2009  . Osteopenia 07/11/2009  . GASTRIC ULCER, HX OF 07/11/2009  . History of Barrett's esophagus 07/11/2009  . Vitamin D deficiency 07/04/2009    Family History  Problem Relation Age of Onset  . Colon cancer Father   . Hypertension Father   . Heart attack Father 75  . Cancer Father        prostate CA  . Pulmonary embolism Mother        deceased  . Hypertension Mother   . Diabetes Sister   . Hypertension Brother   . Hypertension Sister   . Cancer Paternal Grandmother        oral  . Thyroid disease Sister        1/2 sister thyroid problem  . Healthy Daughter   . Heart attack Brother 47  . Breast cancer Cousin     Social History   Tobacco Use  Smoking Status Never Smoker  Smokeless Tobacco Never Used  Past Surgical History:  Procedure Laterality Date  . ABDOMINAL HYSTERECTOMY  2009   Dr. Bethann Goo - benign disease  . BLADDER SURGERY     bladder mesh surgery 2010- Dr. Rhodia Albright  . BREAST CYST ASPIRATION    . COLONOSCOPY W/ POLYPECTOMY  2010  . ENDOSCOPIC RETROGRADE CHOLANGIOPANCREATOGRAPHY (ERCP) WITH PROPOFOL N/A 05/03/2017   Procedure: ENDOSCOPIC RETROGRADE CHOLANGIOPANCREATOGRAPHY (ERCP) WITH PROPOFOL;  Surgeon: Carol Ada, MD;  [facility];  Service: Endoscopy;  Laterality: N/A;  . ERCP N/A 03/13/2017   Procedure: ENDOSCOPIC RETROGRADE CHOLANGIOPANCREATOGRAPHY (ERCP);  Surgeon: Carol Ada, MD;  Location: [facility];  Service: Endoscopy;  Laterality: N/A;  . ERCP N/A 03/14/2017   Procedure: ENDOSCOPIC RETROGRADE CHOLANGIOPANCREATOGRAPHY (ERCP);  Surgeon: Ladene Artist, MD;  Location: [facility];  Service: Endoscopy;  Laterality: N/A;  . IR RADIOLOGIST EVAL & MGMT  04/16/2017  . LAPAROSCOPIC CHOLECYSTECTOMY SINGLE PORT N/A 03/09/2017   Procedure: LAPAROSCOPIC CHOLECYSTECTOMY WITH INTRAOPERATIVE CHOLANGIOGRAM, CLOSURE OF ABERRANT DUCT OF  LUSCHKA;  Surgeon: Michael Boston, MD;  Location: [facility];  Service: General;  Laterality: N/A;    Immunization History  Administered Date(s) Administered  . Influenza Split 12/21/2010, 11/20/2011  . Influenza, Seasonal, Injecte, Preservative Fre 11/17/2018  . Influenza,inj,Quad PF,6+ Mos 11/20/2014, 12/30/2017  . Influenza-Unspecified 11/27/2013, 12/24/2019  . PFIZER(Purple Top)SARS-COV-2 Vaccination 05/07/2019, 06/01/2019, 01/22/2020  . Td 09/01/2008  . Tdap 07/01/2018  . Zoster Recombinat (Shingrix) 12/08/2018, 06/30/2020    Recent Results (from the past 2160 hour(s))  CBC     Status: None   Collection Time: 06/21/20 12:00 AM  Result Value Ref Range   WBC 4.3 3.8 - 10.8 Thousand/uL   RBC 4.68 3.80 - 5.10 Million/uL   Hemoglobin 13.6 11.7 - 15.5 g/dL   HCT 40.8 35.0 - 45.0 %   MCV 87.2 80.0 - 100.0 fL   MCH 29.1 27.0 - 33.0 pg   MCHC 33.3 32.0 - 36.0 g/dL   RDW 13.0 11.0 - 15.0 %   Platelets 305 140 - 400 Thousand/uL   MPV 11.2 7.5 - 12.5 fL  COMPLETE METABOLIC PANEL WITH GFR     Status: None   Collection Time: 06/21/20 12:00 AM  Result Value Ref Range   Glucose, Bld 96 65 - 99 mg/dL    Comment: .            Fasting reference interval .    BUN 17 7 - 25 mg/dL   Creat 0.59 0.50 - 0.99 mg/dL    Comment: For patients >75 years of age, the reference limit for Creatinine is approximately 13% higher for people identified as African-American. .    GFR, Est Non African American 98 > OR = 60 mL/min/1.87m   GFR, Est African American 114 > OR = 60 mL/min/1.727m  BUN/Creatinine Ratio NOT APPLICABLE 6 - 22 (calc)   Sodium 141 135 - 146 mmol/L   Potassium 4.4 3.5 - 5.3 mmol/L   Chloride 108 98 - 110 mmol/L   CO2 27 20 - 32 mmol/L   Calcium 9.7 8.6 - 10.4 mg/dL   Total Protein 6.8 6.1 - 8.1 g/dL   Albumin 4.4 3.6 - 5.1 g/dL   Globulin 2.4 1.9 - 3.7 g/dL (calc)   AG Ratio 1.8 1.0 - 2.5 (calc)   Total Bilirubin 0.4 0.2 - 1.2 mg/dL   Alkaline phosphatase (APISO) 74 37 - 153  U/L   AST 13 10 - 35 U/L   ALT 8 6 - 29 U/L  Lipid panel  Status: None   Collection Time: 06/21/20 12:00 AM  Result Value Ref Range   Cholesterol 177 <200 mg/dL   HDL 60 > OR = 50 mg/dL   Triglycerides 99 <150 mg/dL   LDL Cholesterol (Calc) 98 mg/dL (calc)    Comment: Reference range: <100 . Desirable range <100 mg/dL for primary prevention;   <70 mg/dL for patients with CHD or diabetic patients  with > or = 2 CHD risk factors. Marland Kitchen LDL-C is now calculated using the Martin-Hopkins  calculation, which is a validated novel method providing  better accuracy than the Friedewald equation in the  estimation of LDL-C.  Cresenciano Genre et al. Annamaria Helling. 8916;945(03): 2061-2068  (http://education.[facility].com/faq/FAQ164)    Total CHOL/HDL Ratio 3.0 <5.0 (calc)   Non-HDL Cholesterol (Calc) 117 <130 mg/dL (calc)    Comment: For patients with diabetes plus 1 major ASCVD risk  factor, treating to a non-HDL-C goal of <100 mg/dL  (LDL-C of <70 mg/dL) is considered a therapeutic  option.   Hemoglobin A1c     Status: Abnormal   Collection Time: 06/21/20 12:00 AM  Result Value Ref Range   Hgb A1c MFr Bld 6.0 (H) <5.7 % of total Hgb    Comment: For someone without known diabetes, a hemoglobin  A1c value between 5.7% and 6.4% is consistent with prediabetes and should be confirmed with a  follow-up test. . For someone with known diabetes, a value <7% indicates that their diabetes is well controlled. A1c targets should be individualized based on duration of diabetes, age, comorbid conditions, and other considerations. . This assay result is consistent with an increased risk of diabetes. . Currently, no consensus exists regarding use of hemoglobin A1c for diagnosis of diabetes for children. .    Mean Plasma Glucose 126 mg/dL   eAG (mmol/L) 7.0 mmol/L    No results found.     All questions at time of visit were answered - patient instructed to contact office with any additional  concerns or updates. ER/RTC precautions were reviewed with the patient as applicable.   Please note: manual typing as well as voice recognition software may have been used to produce this document - typos may escape review. Please contact Dr. Sheppard Coil for any needed clarifications.

## 2020-07-07 ENCOUNTER — Other Ambulatory Visit: Payer: Self-pay

## 2020-07-07 ENCOUNTER — Other Ambulatory Visit (HOSPITAL_BASED_OUTPATIENT_CLINIC_OR_DEPARTMENT_OTHER): Payer: Self-pay

## 2020-07-07 ENCOUNTER — Ambulatory Visit (AMBULATORY_SURGERY_CENTER): Payer: 59

## 2020-07-07 VITALS — Ht 61.0 in | Wt 148.0 lb

## 2020-07-07 DIAGNOSIS — Z1211 Encounter for screening for malignant neoplasm of colon: Secondary | ICD-10-CM

## 2020-07-07 MED ORDER — NA SULFATE-K SULFATE-MG SULF 17.5-3.13-1.6 GM/177ML PO SOLN
1.0000 | Freq: Once | ORAL | 0 refills | Status: AC
  Filled 2020-07-07: qty 354, 30d supply, fill #0

## 2020-07-07 NOTE — Progress Notes (Signed)
Pre visit completed via phone call;  Patient verified name, DOB, and address; No egg or soy allergy known to patient  No issues with past sedation with any surgeries or procedures Patient denies ever being told they had issues or difficulty with intubation  No FH of Malignant Hyperthermia No diet pills per patient No home 02 use per patient  No blood thinners per patient  Pt denies issues with constipation  No A fib or A flutter  EMMI video via MyChart  COVID 19 guidelines implemented in PV today with Pt and RN  Pt is fully vaccinated for Covid x 2 + booster; NO PA's for preps discussed with pt in PV today  Discussed with pt there will be an out-of-pocket cost for prep and that varies from $0 to 70 dollars  Due to the COVID-19 pandemic we are asking patients to follow certain guidelines.  Pt aware of COVID protocols and [facility] guidelines  Instructions sent via MyChart;

## 2020-07-08 ENCOUNTER — Other Ambulatory Visit (HOSPITAL_BASED_OUTPATIENT_CLINIC_OR_DEPARTMENT_OTHER): Payer: Self-pay

## 2020-07-21 ENCOUNTER — Encounter: Payer: Self-pay | Admitting: Gastroenterology

## 2020-07-21 ENCOUNTER — Other Ambulatory Visit: Payer: Self-pay

## 2020-07-21 ENCOUNTER — Ambulatory Visit (AMBULATORY_SURGERY_CENTER): Payer: 59 | Admitting: Gastroenterology

## 2020-07-21 VITALS — BP 165/90 | HR 170 | Temp 98.1°F | Resp 22 | Ht 61.0 in | Wt 148.0 lb

## 2020-07-21 DIAGNOSIS — Z800 Family history of malignant neoplasm of digestive organs: Secondary | ICD-10-CM | POA: Diagnosis not present

## 2020-07-21 DIAGNOSIS — K5730 Diverticulosis of large intestine without perforation or abscess without bleeding: Secondary | ICD-10-CM

## 2020-07-21 DIAGNOSIS — E119 Type 2 diabetes mellitus without complications: Secondary | ICD-10-CM | POA: Diagnosis not present

## 2020-07-21 DIAGNOSIS — K219 Gastro-esophageal reflux disease without esophagitis: Secondary | ICD-10-CM | POA: Diagnosis not present

## 2020-07-21 DIAGNOSIS — Z1211 Encounter for screening for malignant neoplasm of colon: Secondary | ICD-10-CM

## 2020-07-21 DIAGNOSIS — K640 First degree hemorrhoids: Secondary | ICD-10-CM

## 2020-07-21 MED ORDER — SODIUM CHLORIDE 0.9 % IV SOLN: 500.0000 mL | Freq: Once | INTRAVENOUS | Status: DC

## 2020-07-21 NOTE — Progress Notes (Signed)
VS by CW  Pt's states no medical or surgical changes since previsit or office visit.  

## 2020-07-21 NOTE — Progress Notes (Signed)
Report to PACU, RN, vss, BBS= Clear.  

## 2020-07-21 NOTE — Op Note (Addendum)
[place] Patient Name: Whitney Neal Procedure Date: 07/21/2020 9:30 AM MRN: [id number] Endoscopist: Gerrit Heck , MD Age: 60 Referring MD:  Date of Birth: September 06, 1960 Gender: Female Account #: [id number] Procedure:                Colonoscopy Indications:              Screening in patient at increased risk: Colorectal                            cancer in father 63 or older, Colon cancer                            screening in patient at increased risk: Colorectal                            cancer in brother                           Last colonoscopy was in 2011 and normal. She is                            otherwise without GI symptoms. Medicines:                Monitored Anesthesia Care Procedure:                Pre-Anesthesia Assessment:                           - Prior to the procedure, a History and Physical                            was performed, and patient medications and                            allergies were reviewed. The patient's tolerance of                            previous anesthesia was also reviewed. The risks                            and benefits of the procedure and the sedation                            options and risks were discussed with the patient.                            All questions were answered, and informed consent                            was obtained. Prior Anticoagulants: The patient has                            taken no previous anticoagulant or antiplatelet  agents. ASA Grade Assessment: II - A patient with                            mild systemic disease. After reviewing the risks                            and benefits, the patient was deemed in                            satisfactory condition to undergo the procedure.                           After obtaining informed consent, the colonoscope                            was passed under direct vision. Throughout the                             procedure, the patient's blood pressure, pulse, and                            oxygen saturations were monitored continuously. The                            Olympus [id number] was introduced                            through the anus and advanced to the the terminal                            ileum. The colonoscopy was performed without                            difficulty. The patient tolerated the procedure                            well. The quality of the bowel preparation was                            excellent. The terminal ileum, ileocecal valve,                            appendiceal orifice, and rectum were photographed. Scope In: 9:38:34 AM Scope Out: 9:52:16 AM Scope Withdrawal Time: 0 hours 11 minutes 12 seconds  Total Procedure Duration: 0 hours 13 minutes 42 seconds  Findings:                 The perianal and digital rectal examinations were                            normal.                           A few small-mouthed diverticula were found in the  sigmoid colon.                           The colon otherwise appeared normal throughout.                           Non-bleeding internal hemorrhoids were found during                            retroflexion. The hemorrhoids were small.                           The terminal ileum appeared normal. Complications:            No immediate complications. Estimated Blood Loss:     Estimated blood loss: none. Impression:               - Diverticulosis in the sigmoid colon.                           - The entire examined colon is normal.                           - Non-bleeding internal hemorrhoids.                           - The examined portion of the ileum was normal.                           - No specimens collected. Recommendation:           - Patient has a contact number available for                            emergencies. The signs and symptoms of potential                             delayed complications were discussed with the                            patient. Return to normal activities tomorrow.                            Written discharge instructions were provided to the                            patient.                           - Resume previous diet.                           - Continue present medications.                           - Repeat colonoscopy in 5 years for screening  purposes due to family history.                           - Return to GI office PRN. Gerrit Heck, MD 07/21/2020 9:57:16 AM

## 2020-07-21 NOTE — Patient Instructions (Signed)
YOU HAD AN ENDOSCOPIC PROCEDURE TODAY AT THE [facility]:   Refer to the procedure report that was given to you for any specific questions about what was found during the examination.  If the procedure report does not answer your questions, please call your gastroenterologist to clarify.  If you requested that your care partner not be given the details of your procedure findings, then the procedure report has been included in a sealed envelope for you to review at your convenience later.  YOU SHOULD EXPECT: Some feelings of bloating in the abdomen. Passage of more gas than usual.  Walking can help get rid of the air that was put into your GI tract during the procedure and reduce the bloating. If you had a lower endoscopy (such as a colonoscopy or flexible sigmoidoscopy) you may notice spotting of blood in your stool or on the toilet paper. If you underwent a bowel prep for your procedure, you may not have a normal bowel movement for a few days.  Please Note:  You might notice some irritation and congestion in your nose or some drainage.  This is from the oxygen used during your procedure.  There is no need for concern and it should clear up in a day or so.  SYMPTOMS TO REPORT IMMEDIATELY:  Following lower endoscopy (colonoscopy or flexible sigmoidoscopy):  Excessive amounts of blood in the stool  Significant tenderness or worsening of abdominal pains  Swelling of the abdomen that is new, acute  Fever of 100F or higher   For urgent or emergent issues, a gastroenterologist can be reached at any hour by calling [phone]. Do not use MyChart messaging for urgent concerns.    DIET:  We do recommend a small meal at first, but then you may proceed to your regular diet.  Drink plenty of fluids but you should avoid alcoholic beverages for 24 hours.  MEDICATIONS:  Continue present medications.  Please see handouts given to you by your recovery nurse.  Thank you for allowing us to  provide for your healthcare needs today.  ACTIVITY:  You should plan to take it easy for the rest of today and you should NOT DRIVE or use heavy machinery until tomorrow (because of the sedation medicines used during the test).    FOLLOW UP: Our staff will call the number listed on your records 48-72 hours following your procedure to check on you and address any questions or concerns that you may have regarding the information given to you following your procedure. If we do not reach you, we will leave a message.  We will attempt to reach you two times.  During this call, we will ask if you have developed any symptoms of COVID 19. If you develop any symptoms (ie: fever, flu-like symptoms, shortness of breath, cough etc.) before then, please call [phone].  If you test positive for Covid 19 in the 2 weeks post procedure, please call and report this information to us.    If any biopsies were taken you will be contacted by phone or by letter within the next 1-3 weeks.  Please call us at [phone] if you have not heard about the biopsies in 3 weeks.    SIGNATURES/CONFIDENTIALITY: You and/or your care partner have signed paperwork which will be entered into your electronic medical record.  These signatures attest to the fact that that the information above on your After Visit Summary has been reviewed and is understood.  Full responsibility of the   confidentiality of this discharge information lies with you and/or your care-partner.  

## 2020-07-25 ENCOUNTER — Telehealth: Payer: Self-pay

## 2020-07-25 NOTE — Telephone Encounter (Signed)
  Follow up Call-  Call back number 07/21/2020  Post procedure Call Back phone  # [phone]  Permission to leave phone message Yes  Some recent data might be hidden     Patient questions:  Do you have a fever, pain , or abdominal swelling? No. Pain Score  0 *  Have you tolerated food without any problems? Yes.    Have you been able to return to your normal activities? Yes.    Do you have any questions about your discharge instructions: Diet   No. Medications  No. Follow up visit  No.  Do you have questions or concerns about your Care? No.  Actions: * If pain score is 4 or above: No action needed, pain <4.  1. Have you developed a fever since your procedure? no  2.   Have you had an respiratory symptoms (SOB or cough) since your procedure? no  3.   Have you tested positive for COVID 19 since your procedure no  4.   Have you had any family members/close contacts diagnosed with the COVID 19 since your procedure?  no   If yes to any of these questions please route to Joylene John, RN and Joella Prince, RN

## 2020-07-25 NOTE — Telephone Encounter (Signed)
  Follow up Call-  Call back number 07/21/2020  Post procedure Call Back phone  # [phone]  Permission to leave phone message Yes  Some recent data might be hidden     Left message

## 2020-08-03 ENCOUNTER — Other Ambulatory Visit: Payer: Self-pay | Admitting: Osteopathic Medicine

## 2020-08-03 ENCOUNTER — Other Ambulatory Visit (HOSPITAL_BASED_OUTPATIENT_CLINIC_OR_DEPARTMENT_OTHER): Payer: Self-pay

## 2020-08-03 DIAGNOSIS — R7303 Prediabetes: Secondary | ICD-10-CM

## 2020-08-03 MED ORDER — METFORMIN HCL ER 500 MG PO TB24
ORAL_TABLET | Freq: Every day | ORAL | 3 refills | Status: DC
  Filled 2020-08-03: qty 90, 90d supply, fill #0
  Filled 2020-12-04: qty 90, 90d supply, fill #1
  Filled 2021-03-12: qty 90, 90d supply, fill #2
  Filled 2021-06-16: qty 90, 90d supply, fill #3

## 2020-09-20 ENCOUNTER — Other Ambulatory Visit: Payer: Self-pay | Admitting: Osteopathic Medicine

## 2020-09-20 ENCOUNTER — Other Ambulatory Visit (HOSPITAL_BASED_OUTPATIENT_CLINIC_OR_DEPARTMENT_OTHER): Payer: Self-pay

## 2020-09-20 ENCOUNTER — Other Ambulatory Visit: Payer: Self-pay | Admitting: Gastroenterology

## 2020-09-20 DIAGNOSIS — E7800 Pure hypercholesterolemia, unspecified: Secondary | ICD-10-CM

## 2020-09-21 ENCOUNTER — Other Ambulatory Visit (HOSPITAL_BASED_OUTPATIENT_CLINIC_OR_DEPARTMENT_OTHER): Payer: Self-pay

## 2020-09-21 ENCOUNTER — Other Ambulatory Visit: Payer: Self-pay | Admitting: Osteopathic Medicine

## 2020-09-21 DIAGNOSIS — I10 Essential (primary) hypertension: Secondary | ICD-10-CM

## 2020-09-21 MED ORDER — ATORVASTATIN CALCIUM 40 MG PO TABS
ORAL_TABLET | Freq: Every day | ORAL | 3 refills | Status: DC
  Filled 2020-09-21: qty 90, 90d supply, fill #0
  Filled 2020-12-04: qty 90, 90d supply, fill #1
  Filled 2021-03-12: qty 90, 90d supply, fill #2
  Filled 2021-07-24: qty 90, 90d supply, fill #3

## 2020-09-21 MED ORDER — LOSARTAN POTASSIUM 25 MG PO TABS
ORAL_TABLET | ORAL | 3 refills | Status: DC
  Filled 2020-09-21: qty 90, 90d supply, fill #0
  Filled 2021-01-08: qty 90, 90d supply, fill #1
  Filled 2021-04-10: qty 90, 90d supply, fill #2
  Filled 2021-07-24: qty 90, 90d supply, fill #3

## 2020-09-21 NOTE — Telephone Encounter (Signed)
Whitney Neal requesting med refill for losartan. Rx not listed in active med list.

## 2020-09-23 ENCOUNTER — Other Ambulatory Visit: Payer: Self-pay

## 2020-10-12 ENCOUNTER — Other Ambulatory Visit: Payer: Self-pay

## 2020-10-20 ENCOUNTER — Telehealth: Payer: Self-pay

## 2020-10-20 NOTE — Telephone Encounter (Signed)
Opened in error

## 2020-12-04 ENCOUNTER — Other Ambulatory Visit: Payer: Self-pay | Admitting: Osteopathic Medicine

## 2020-12-04 DIAGNOSIS — K219 Gastro-esophageal reflux disease without esophagitis: Secondary | ICD-10-CM

## 2020-12-05 ENCOUNTER — Other Ambulatory Visit (HOSPITAL_BASED_OUTPATIENT_CLINIC_OR_DEPARTMENT_OTHER): Payer: Self-pay

## 2020-12-05 MED ORDER — OMEPRAZOLE 40 MG PO CPDR
DELAYED_RELEASE_CAPSULE | Freq: Every day | ORAL | 1 refills | Status: DC
  Filled 2020-12-05: qty 90, 90d supply, fill #0
  Filled 2021-05-21: qty 90, 90d supply, fill #1

## 2020-12-09 ENCOUNTER — Telehealth: Payer: Self-pay | Admitting: Osteopathic Medicine

## 2020-12-09 NOTE — Telephone Encounter (Signed)
Patient calling in requesting to schedule np appt w/ Dr. Mitchel Honour   Received message when trying to schedule appt that patient was dismissed from practice 10.17.2017  Patient says she does not remember being dismissed only that her old provider left & she has not been back since  Please confirm if able to establish np appt & notify patient [phone]

## 2020-12-12 ENCOUNTER — Telehealth: Payer: Self-pay

## 2020-12-12 NOTE — Telephone Encounter (Signed)
Patient called to seek obtaining care from Dr. Mitchel Honour. Pt was notified then that she has previously been dismissed from the practice. She states it was not from this office and had no clue of it.   Asked Dr. Mitchel Honour if he would like to see the pt, his response is no patient was dismissed and it can not be overturned.  Call patient voicemail was reached.

## 2021-01-02 ENCOUNTER — Ambulatory Visit: Payer: 59 | Admitting: Osteopathic Medicine

## 2021-01-09 ENCOUNTER — Other Ambulatory Visit (HOSPITAL_BASED_OUTPATIENT_CLINIC_OR_DEPARTMENT_OTHER): Payer: Self-pay

## 2021-01-09 ENCOUNTER — Ambulatory Visit: Payer: 59 | Admitting: Medical-Surgical

## 2021-01-16 ENCOUNTER — Other Ambulatory Visit: Payer: Self-pay

## 2021-01-16 ENCOUNTER — Ambulatory Visit (INDEPENDENT_AMBULATORY_CARE_PROVIDER_SITE_OTHER): Payer: 59 | Admitting: Medical-Surgical

## 2021-01-16 ENCOUNTER — Encounter: Payer: Self-pay | Admitting: Medical-Surgical

## 2021-01-16 VITALS — BP 126/88 | HR 77 | Resp 20 | Ht 61.0 in | Wt 150.6 lb

## 2021-01-16 DIAGNOSIS — E559 Vitamin D deficiency, unspecified: Secondary | ICD-10-CM | POA: Diagnosis not present

## 2021-01-16 DIAGNOSIS — E785 Hyperlipidemia, unspecified: Secondary | ICD-10-CM

## 2021-01-16 DIAGNOSIS — K219 Gastro-esophageal reflux disease without esophagitis: Secondary | ICD-10-CM | POA: Diagnosis not present

## 2021-01-16 DIAGNOSIS — Z7689 Persons encountering health services in other specified circumstances: Secondary | ICD-10-CM

## 2021-01-16 DIAGNOSIS — R7303 Prediabetes: Secondary | ICD-10-CM

## 2021-01-16 DIAGNOSIS — H6982 Other specified disorders of Eustachian tube, left ear: Secondary | ICD-10-CM

## 2021-01-16 DIAGNOSIS — I10 Essential (primary) hypertension: Secondary | ICD-10-CM | POA: Diagnosis not present

## 2021-01-16 LAB — POCT GLYCOSYLATED HEMOGLOBIN (HGB A1C): HbA1c, POC (controlled diabetic range): 6.2 % (ref 0.0–7.0)

## 2021-01-16 NOTE — Progress Notes (Signed)
  HPI with pertinent ROS:   CC: transfer of care, DM, HTN   HPI: Very pleasant 60 year old female presenting today for transfer of care to a new PCP and follow-up on chronic diseases.  Prediabetes-has been taking metformin 500 mg daily, tolerating well without side effects.  Does not check her sugars at home.  Hypertension-taking losartan 25 mg daily as prescribed, tolerating well without side effects. Denies CP, SOB, palpitations, lower extremity edema, dizziness, headaches, or vision changes.  Hyperlipidemia-taking atorvastatin 80 mg daily as prescribed, tolerating well without side effects.  GERD-using omeprazole 40 mg daily as needed with good control.  Left ear-Notes that she woke up this morning feeling as if she has water in her left ear.  She does use Flonase on an as needed basis but uses saline nasal spray more often.  No other upper respiratory symptoms.  No fevers, chills, or myalgias.  I reviewed the past medical history, family history, social history, surgical history, and allergies today and no changes were needed.  Please see the problem list section below in epic for further details.  Physical exam:   General: Well Developed, well nourished, and in no acute distress.  Neuro: Alert and oriented x3.  HEENT: Normocephalic, atraumatic.  Bilateral external ear canals patent.  TMs normal with intact cone of light. Skin: Warm and dry. Cardiac: Regular rate and rhythm, no murmurs rubs or gallops, no lower extremity edema.  Respiratory: Clear to auscultation bilaterally. Not using accessory muscles, speaking in full sentences.  Impression and Recommendations:    1. Encounter to establish care Reviewed available information and discussed care concerns with patient.   2. Essential hypertension Blood pressure well controlled.  Continue losartan 25 mg daily.  3. Gastroesophageal reflux disease without esophagitis Continue omeprazole 40 mg daily as needed  4.  Hyperlipidemia, unspecified hyperlipidemia type Continue atorvastatin 40 mg daily.  5. Prediabetes POCT hemoglobin A1c 6.2%.  Continue metformin 500 mg daily as prescribed. - POCT HgB A1C  6. Acute dysfunction of left eustachian tube Recommend using Flonase at least once daily for the next 7 days.  Reviewed administration instructions for optimal effect.  Return in about 6 months (around 07/16/2021) for Prediabetes/HTN/HLD follow up. ___________________________________________ Clearnce Sorrel, DNP, APRN, FNP-BC Primary Care and [place]

## 2021-03-13 ENCOUNTER — Other Ambulatory Visit (HOSPITAL_BASED_OUTPATIENT_CLINIC_OR_DEPARTMENT_OTHER): Payer: Self-pay

## 2021-04-10 ENCOUNTER — Other Ambulatory Visit (HOSPITAL_BASED_OUTPATIENT_CLINIC_OR_DEPARTMENT_OTHER): Payer: Self-pay

## 2021-05-22 ENCOUNTER — Other Ambulatory Visit (HOSPITAL_BASED_OUTPATIENT_CLINIC_OR_DEPARTMENT_OTHER): Payer: Self-pay

## 2021-06-01 ENCOUNTER — Other Ambulatory Visit: Payer: Self-pay

## 2021-06-01 ENCOUNTER — Encounter (HOSPITAL_BASED_OUTPATIENT_CLINIC_OR_DEPARTMENT_OTHER): Payer: Self-pay | Admitting: *Deleted

## 2021-06-01 ENCOUNTER — Emergency Department (HOSPITAL_BASED_OUTPATIENT_CLINIC_OR_DEPARTMENT_OTHER)
Admission: EM | Admit: 2021-06-01 | Discharge: 2021-06-02 | Disposition: A | Discharge status: Home / Self Care | Payer: 59 | Attending: Emergency Medicine | Admitting: Emergency Medicine

## 2021-06-01 ENCOUNTER — Emergency Department (HOSPITAL_BASED_OUTPATIENT_CLINIC_OR_DEPARTMENT_OTHER): Payer: 59

## 2021-06-01 DIAGNOSIS — Z79899 Other long term (current) drug therapy: Secondary | ICD-10-CM | POA: Diagnosis not present

## 2021-06-01 DIAGNOSIS — E119 Type 2 diabetes mellitus without complications: Secondary | ICD-10-CM | POA: Insufficient documentation

## 2021-06-01 DIAGNOSIS — I10 Essential (primary) hypertension: Secondary | ICD-10-CM | POA: Diagnosis not present

## 2021-06-01 DIAGNOSIS — Z7984 Long term (current) use of oral hypoglycemic drugs: Secondary | ICD-10-CM | POA: Insufficient documentation

## 2021-06-01 DIAGNOSIS — M79622 Pain in left upper arm: Secondary | ICD-10-CM | POA: Diagnosis present

## 2021-06-01 DIAGNOSIS — R079 Chest pain, unspecified: Secondary | ICD-10-CM | POA: Diagnosis not present

## 2021-06-01 LAB — CBC WITH DIFFERENTIAL/PLATELET
Abs Immature Granulocytes: 0.01 10*3/uL (ref 0.00–0.07)
Basophils Absolute: 0 10*3/uL (ref 0.0–0.1)
Basophils Relative: 1 %
Eosinophils Absolute: 0 10*3/uL (ref 0.0–0.5)
Eosinophils Relative: 1 %
HCT: 38.9 % (ref 36.0–46.0)
Hemoglobin: 13.2 g/dL (ref 12.0–15.0)
Immature Granulocytes: 0 %
Lymphocytes Relative: 40 %
Lymphs Abs: 2.3 10*3/uL (ref 0.7–4.0)
MCH: 28.8 pg (ref 26.0–34.0)
MCHC: 33.9 g/dL (ref 30.0–36.0)
MCV: 84.9 fL (ref 80.0–100.0)
Monocytes Absolute: 0.5 10*3/uL (ref 0.1–1.0)
Monocytes Relative: 8 %
Neutro Abs: 3 10*3/uL (ref 1.7–7.7)
Neutrophils Relative %: 50 %
Platelets: 308 10*3/uL (ref 150–400)
RBC: 4.58 MIL/uL (ref 3.87–5.11)
RDW: 13.4 % (ref 11.5–15.5)
WBC: 5.8 10*3/uL (ref 4.0–10.5)
nRBC: 0 % (ref 0.0–0.2)

## 2021-06-01 LAB — BASIC METABOLIC PANEL
Anion gap: 9 (ref 5–15)
BUN: 13 mg/dL (ref 8–23)
CO2: 25 mmol/L (ref 22–32)
Calcium: 9.2 mg/dL (ref 8.9–10.3)
Chloride: 103 mmol/L (ref 98–111)
Creatinine, Ser: 0.7 mg/dL (ref 0.44–1.00)
GFR, Estimated: >60 mL/min (ref >60)
Glucose, Bld: 127 mg/dL — ABNORMAL HIGH (ref 70–99)
Potassium: 3.5 mmol/L (ref 3.5–5.1)
Sodium: 137 mmol/L (ref 135–145)

## 2021-06-01 LAB — TROPONIN I (HIGH SENSITIVITY)
Troponin I (High Sensitivity): 2 ng/L (ref <18)
Troponin I (High Sensitivity): 3 ng/L (ref <18)

## 2021-06-01 MED ORDER — LABETALOL HCL 5 MG/ML IV SOLN
5.0000 mg | Freq: Once | INTRAVENOUS | Status: AC
Start: 2021-06-01 — End: 2021-06-01
  Administered 2021-06-01: 5 mg via INTRAVENOUS
  Filled 2021-06-01: qty 4

## 2021-06-01 NOTE — ED Provider Notes (Signed)
Care of the patient assumed at the change of shift pending second trop which was neg.  ?Physical Exam  ?BP (!) 154/90   Pulse 70   Temp 98.8 ?F (37.1 ?C) (Oral)   Resp 19   Ht 5' (1.524 m)   Wt 65.8 kg   SpO2 94%   BMI 28.32 kg/m?  ? ?Physical Exam ? ?Procedures  ?Procedures ? ?ED Course / MDM  ?  ?Medical Decision Making ?Problems Addressed: ?Hypertension, unspecified type: acute illness or injury ? ?Amount and/or Complexity of Data Reviewed ?Labs: ordered. Decision-making details documented in ED Course. ?Radiology: ordered and independent interpretation performed. Decision-making details documented in ED Course. ? ?Risk ?Prescription drug management. ? ? ? ? ? ? ? ?  ?Truddie Hidden, MD ?06/01/21 2355 ? ?

## 2021-06-01 NOTE — ED Triage Notes (Signed)
Pt reports elevated BP, left arm pain, and feeling bad x 2 hours ?

## 2021-06-01 NOTE — ED Provider Notes (Signed)
?Whitney Neal ?Provider Note ? ? ?CSN: [id number] ?Arrival date & time: 06/01/21  2118 ? ?  ? ?History ? ?Chief Complaint  ?Patient presents with  ? Hypertension  ? ? ?Whitney Neal is a 61 y.o. female. ? ?Patient states that her blood pressure was high and she is having some left upper arm pain.  Denies any chest pain, shortness of breath.  No exertional component.  History of diabetes, high cholesterol, hypertension.  Felt like she got warm and flushed while at work.  Denies any headache, vision changes.  No numbness or weakness.  No recent surgery or recent travel.  Does do some manual labor at work. ? ?The history is provided by the patient.  ? ?  ? ?Home Medications ?Prior to Admission medications   ?Medication Sig Start Date End Date Taking? Authorizing Provider  ?atorvastatin (LIPITOR) 40 MG tablet TAKE 1 TABLET (40 MG TOTAL) BY MOUTH DAILY. 09/21/20 09/21/21  Emeterio Reeve, DO  ?fluticasone (FLONASE) 50 MCG/ACT nasal spray Place 1 spray into both nostrils daily as needed for allergies or rhinitis.    [provider]  ?losartan (COZAAR) 25 MG tablet TAKE 1 TABLET (25 MG) BY MOUTH DAILY. 09/21/20 09/21/21  Emeterio Reeve, DO  ?metFORMIN (GLUCOPHAGE-XR) 500 MG 24 hr tablet TAKE 1 TABLET (500 MG TOTAL) BY MOUTH DAILY WITH BREAKFAST. 08/03/20 08/03/21  Emeterio Reeve, DO  ?Multiple Vitamins-Minerals (MULTIVITAMIN PO) Take 1 tablet by mouth daily.    [provider]  ?omeprazole (PRILOSEC) 40 MG capsule TAKE 1 CAPSULE (40 MG TOTAL) BY MOUTH DAILY. 12/05/20 12/05/21  Hali Marry, MD  ?   ? ?Allergies    ?Codeine, Morphine, Morphine and related, Other, and Rocephin [ceftriaxone sodium in dextrose]   ? ?Review of Systems   ?Review of Systems ? ?Physical Exam ?Updated Vital Signs ?BP (!) 167/92   Pulse 84   Temp 98.8 ?F (37.1 ?C) (Oral)   Resp 15   Ht 5' (1.524 m)   Wt 65.8 kg   SpO2 98%   BMI 28.32 kg/m?  ?Physical Exam ?Vitals and nursing note  reviewed.  ?Constitutional:   ?   General: She is not in acute distress. ?   Appearance: She is well-developed. She is not ill-appearing.  ?HENT:  ?   Head: Normocephalic and atraumatic.  ?   Nose: Nose normal.  ?   Mouth/Throat:  ?   Mouth: Mucous membranes are moist.  ?Eyes:  ?   Extraocular Movements: Extraocular movements intact.  ?   Conjunctiva/sclera: Conjunctivae normal.  ?   Pupils: Pupils are equal, round, and reactive to light.  ?Cardiovascular:  ?   Rate and Rhythm: Normal rate and regular rhythm.  ?   Pulses: Normal pulses.  ?   Heart sounds: Normal heart sounds. No murmur heard. ?Pulmonary:  ?   Effort: Pulmonary effort is normal. No respiratory distress.  ?   Breath sounds: Normal breath sounds.  ?Abdominal:  ?   Palpations: Abdomen is soft.  ?   Tenderness: There is no abdominal tenderness.  ?Musculoskeletal:     ?   General: No swelling.  ?   Cervical back: Normal range of motion and neck supple.  ?Skin: ?   General: Skin is warm and dry.  ?   Capillary Refill: Capillary refill takes less than 2 seconds.  ?Neurological:  ?   Mental Status: She is alert.  ?   Comments: 5+ out of 5 strength throughout, normal sensation,  no drift, normal finger-nose-finger, normal speech  ?Psychiatric:     ?   Mood and Affect: Mood normal.  ? ? ?ED Results / Procedures / Treatments   ?Labs ?(all labs ordered are listed, but only abnormal results are displayed) ?Labs Reviewed  ?BASIC METABOLIC PANEL - Abnormal; Notable for the following components:  ?    Result Value  ? Glucose, Bld 127 (*)   ? All other components within normal limits  ?CBC WITH DIFFERENTIAL/PLATELET  ?TROPONIN I (HIGH SENSITIVITY)  ? ? ?EKG ?EKG Interpretation ? ?Date/Time:  Thursday June 01 2021 21:28:43 EDT ?Ventricular Rate:  99 ?PR Interval:  166 ?QRS Duration: 93 ?QT Interval:  348 ?QTC Calculation: 447 ?R Axis:   63 ?Text Interpretation: Sinus rhythm Low voltage, precordial leads Confirmed by Ronnald Nian, Roxy Filler ([id number]) on 06/01/2021 9:35:58  PM ? ?Radiology ?DG Chest Portable 1 View ? ?Result Date: 06/01/2021 ?CLINICAL DATA:  Pain. Patient reports elevated blood pressure, left arm pain. EXAM: PORTABLE CHEST 1 VIEW COMPARISON:  Radiograph 04/02/2017 FINDINGS: The cardiomediastinal contours are normal. The lungs are clear. Pulmonary vasculature is normal. No consolidation, pleural effusion, or pneumothorax. No acute osseous abnormalities are seen. IMPRESSION: No acute chest findings. Electronically Signed   By: Keith Rake M.D.   On: 06/01/2021 22:10   ? ?Procedures ?Procedures  ? ? ?Medications Ordered in ED ?Medications  ?labetalol (NORMODYNE) injection 5 mg (5 mg Intravenous Given 06/01/21 2230)  ? ? ?ED Course/ Medical Decision Making/ A&P ?  ?                        ?Medical Decision Making ?Amount and/or Complexity of Data Reviewed ?Labs: ordered. ?Radiology: ordered. ? ?Risk ?Prescription drug management. ? ? ?ELLAREE GEAR is here with high blood pressure, generalized malaise.  Patient with blood pressure 168/96.  Otherwise normal vitals.  History of high cholesterol, diabetes, hypertension.  Was at work and started to not feel very well.  Noticed her blood pressure was high.  Some discomfort in her left upper arm.  Denies any chest pain or shortness of breath.  No recent surgery or travel.  No history of blood clot.  Overall she feels better now but still feels slightly weak.  She denies any weakness or numbness.  She has normal neurological exam.  Normal strength and sensation throughout.  Normal speech.  Denies headache.  EKG per my review and interpretation shows sinus rhythm.  No ischemic changes.  Overall atypical story for ACS but differential diagnosis includes ACS, pneumonia, pneumothorax, electrolyte abnormality, anemia.  Will check CBC, BMP, troponin, chest x-ray. ? ?Per my review and interpretation of labs there is no significant anemia, electrolyte abnormality, kidney injury.  Troponin is normal.  Chest x-ray per my review and  interpretation shows no signs of pneumothorax or pneumonia.  Will repeat troponin, if unremarkable will have her follow-up with cardiology.  Handed off to oncoming ED staff. ? ?This chart was dictated using voice recognition software.  Despite best efforts to proofread,  errors can occur which can change the documentation meaning.  ? ? ? ? ? ? ? ?Final Clinical Impression(s) / ED Diagnoses ?Final diagnoses:  ?Hypertension, unspecified type  ? ? ?Rx / DC Orders ?ED Discharge Orders   ? ? None  ? ?  ? ? ?  ?Lennice Sites, DO ?06/01/21 2314 ? ?

## 2021-06-02 NOTE — ED Notes (Signed)
Pt. Not in any distress at time of discharge and has no chest pain or arm pain at time of discharge. ?

## 2021-06-04 NOTE — Progress Notes (Signed)
?  HPI with pertinent ROS:  ? ?CC: hospital discharge follow up ? ?HPI: ?Pleasant 61 year old female presenting today for follow up after an ED visit on 06/01/2021 for increased BP and left upper arm discomfort. She was fully evaluated with normal labs, CXR, EKG, and exam. She was given Labetalol in the ED then discharged with instructions to follow up with Cardiology. She has an appointment with Dr. Stanford Breed on 06/07/2021.  ? ?Over the past month, has had some intermittent right flank pain that is described as squeezing and colicky.  This happens some days and not others.  Has noticed that increasing her water intake seems to help.  She is also drinking cranberry juice and eating cranberries regularly which seems to help.  Today, her pain is completely gone and has been so for several days.    ? ?I reviewed the past medical history, family history, social history, surgical history, and allergies today and no changes were needed.  Please see the problem list section below in epic for further details. ? ?Physical exam:  ? ?General: Well Developed, well nourished, and in no acute distress.  ?Neuro: Alert and oriented x3.  ?HEENT: Normocephalic, atraumatic.  ?Skin: Warm and dry. ?Cardiac: Regular rate and rhythm, no murmurs rubs or gallops, no lower extremity edema.  ?Respiratory: Clear to auscultation bilaterally. Not using accessory muscles, speaking in full sentences. ?Abdomen: Soft, nontender, nondistended. No CVA tenderness. ? ?Impression and Recommendations:   ? ?1. Hospital discharge follow-up ?Reviewed hospitalization records.  She was given labetalol in the ED but no medications were changed at discharge.  She has been taking losartan as prescribed which is controlling her blood pressures at home.  We have uploaded a list of her home readings in the chart for review.  For now, continue losartan 25 mg daily and follow-up with cardiology as recommended. ? ?2. Prediabetes ?Hemoglobin A1c today 5.9%.  Continue  metformin 500 mg once daily as prescribed. ?- POCT HgB A1C ? ?3. Hyperlipidemia, unspecified hyperlipidemia type ?Checking lipid panel today.  Continue atorvastatin 40 mg daily. ?- Lipid panel ? ?4. Right flank pain ?Symptoms have resolved and exam today is benign.  Consider possible dehydration versus kidney stone.  Since she is pain-free today, we will continue to monitor this with instructions to remain well-hydrated throughout the day.  If pain returns, please come back for further evaluation.  Patient agreeable to the plan. ? ?Return in about 6 months (around 12/05/2021) for prediabetes/GERD/HTN follow up. ?___________________________________________ ?Clearnce Sorrel, DNP, APRN, FNP-BC ?Primary Care and Sports Medicine ?[place] ?

## 2021-06-05 ENCOUNTER — Ambulatory Visit (INDEPENDENT_AMBULATORY_CARE_PROVIDER_SITE_OTHER): Payer: 59 | Admitting: Medical-Surgical

## 2021-06-05 ENCOUNTER — Encounter: Payer: Self-pay | Admitting: Medical-Surgical

## 2021-06-05 VITALS — BP 136/83 | HR 74 | Resp 20 | Ht 60.0 in | Wt 154.0 lb

## 2021-06-05 DIAGNOSIS — Z09 Encounter for follow-up examination after completed treatment for conditions other than malignant neoplasm: Secondary | ICD-10-CM

## 2021-06-05 DIAGNOSIS — R7303 Prediabetes: Secondary | ICD-10-CM | POA: Diagnosis not present

## 2021-06-05 DIAGNOSIS — R109 Unspecified abdominal pain: Secondary | ICD-10-CM

## 2021-06-05 DIAGNOSIS — E785 Hyperlipidemia, unspecified: Secondary | ICD-10-CM

## 2021-06-05 LAB — POCT GLYCOSYLATED HEMOGLOBIN (HGB A1C): HbA1c, POC (prediabetic range): 5.9 % (ref 5.7–6.4)

## 2021-06-05 NOTE — Progress Notes (Signed)
? ? ?Referring-Whitney Charna Archer, NP ?Reason for referral-hypertension and left upper extremity pain ? ?HPI: 61 year old female for evaluation of hypertension and left upper extremity pain at request of Samuel Bouche, NP.  Patient seen in the emergency room June 01, 2021 with hypertension and left upper extremity pain.  Blood pressure 167/92.  Chest x-ray showed no infiltrates.  Troponins normal, hemoglobin 13.2.  Referred to cardiology.  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  At work 1 day she developed a headache and her blood pressure was elevated.  She also developed pain in her left biceps area described as a tightness.  The pain lasted for approximately 30 minutes.  There was no radiation.  She had mild nausea but no diaphoresis or dyspnea.  Her pain improved after a medication she was given in the emergency room.  She otherwise denies dyspnea on exertion, orthopnea, PND, pedal edema, exertional chest pain or syncope.  Also note her blood pressure has improved and is now with systolics in the 034-917 range.  Cardiology now asked to evaluate. ? ?Current Outpatient Medications  ?Medication Sig Dispense Refill  ? atorvastatin (LIPITOR) 40 MG tablet TAKE 1 TABLET (40 MG TOTAL) BY MOUTH DAILY. 90 tablet 3  ? losartan (COZAAR) 25 MG tablet TAKE 1 TABLET (25 MG) BY MOUTH DAILY. 90 tablet 3  ? metFORMIN (GLUCOPHAGE-XR) 500 MG 24 hr tablet TAKE 1 TABLET (500 MG TOTAL) BY MOUTH DAILY WITH BREAKFAST. 90 tablet 3  ? Multiple Vitamins-Minerals (MULTIVITAMIN PO) Take 1 tablet by mouth daily.    ? omeprazole (PRILOSEC) 40 MG capsule TAKE 1 CAPSULE (40 MG TOTAL) BY MOUTH DAILY. 90 capsule 1  ? ?Current Facility-Administered Medications  ?Medication Dose Route Frequency Provider Last Rate Last Admin  ? 0.9 %  sodium chloride infusion  500 mL Intravenous Once Cirigliano, Vito V, DO      ? ? ?Allergies  ?Allergen Reactions  ? Codeine Other (See Comments)  ?  REACTION: headache, visual disturbance  ? Morphine Other  (See Comments)  ?  Unknown  ? Morphine And Related Other (See Comments)  ?  Unknown  ? Other Rash  ?  Ceftriaxone Sodium In Dextrose  ? Rocephin [Ceftriaxone Sodium In Dextrose] Rash  ? ? ? ?Past Medical History:  ?Diagnosis Date  ? 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 Acute on chronic cholecystitis s/p lap cholecystectomy 03/09/2017 03/09/2017  ? Atypical chest pain   ? Barrett's esophagus   ? history of.  Normal EGD 2011, 2014  ? Cervical lymphadenopathy   ? left  ? Cervical strain   ? Constipation   ? Delayed bile leak s/p ERCP/stenting 03/14/2017   ? Diabetes mellitus without complication ([place])   ? on meds  ? GERD (gastroesophageal reflux disease)   ? on PRN  ? Hepatic abscesses s/p perc drainage 04/04/2017 04/04/2017  ? Hypercholesteremia   ? on meds  ? Hyperglycemia   ? a1c 6.3 7/12  ? Hyperplastic colonic polyp   ? history of   ? Hypertension   ? on meds  ? Migraine   ? Osteopenia   ? PONV (postoperative nausea and vomiting)   ? Ulcer 2010  ? Urinary incontinence   ? Vitamin D deficiency   ? ? ?Past Surgical History:  ?Procedure Laterality Date  ? ABDOMINAL HYSTERECTOMY  2009  ? Dr. Bethann Goo - benign disease  ? BLADDER SURGERY    ? bladder mesh surgery 2010- Dr.  Rhodia Albright  ? BREAST CYST ASPIRATION    ? COLONOSCOPY W/ POLYPECTOMY  2011  ? ENDOSCOPIC RETROGRADE CHOLANGIOPANCREATOGRAPHY (ERCP) WITH PROPOFOL N/A 05/03/2017  ? Procedure: ENDOSCOPIC RETROGRADE CHOLANGIOPANCREATOGRAPHY (ERCP) WITH PROPOFOL;  Surgeon: Carol Ada, MD;  Location: [facility];  Service: Endoscopy;  Laterality: N/A;  ? ERCP N/A 03/13/2017  ? Procedure: ENDOSCOPIC RETROGRADE CHOLANGIOPANCREATOGRAPHY (ERCP);  Surgeon: Carol Ada, MD;  Location: [place];  Service: Endoscopy;  Laterality: N/A;  ? ERCP N/A 03/14/2017  ? Procedure: ENDOSCOPIC RETROGRADE CHOLANGIOPANCREATOGRAPHY (ERCP);  Surgeon: Ladene Artist, MD;  Location: [facility];  Service: Endoscopy;  Laterality: N/A;  ? IR RADIOLOGIST EVAL &  MGMT  04/16/2017  ? LAPAROSCOPIC CHOLECYSTECTOMY SINGLE PORT N/A 03/09/2017  ? Procedure: LAPAROSCOPIC CHOLECYSTECTOMY WITH INTRAOPERATIVE CHOLANGIOGRAM, CLOSURE OF ABERRANT DUCT OF LUSCHKA;  Surgeon: Michael Boston, MD;  Location: [facility];  Service: General;  Laterality: N/A;  ? ? ?Social History  ? ?Socioeconomic History  ? Marital status: Married  ?  Spouse name: Not on file  ? Number of children: 2  ? Years of education: Not on file  ? Highest education level: Not on file  ?Occupational History  ? Occupation: NURSE TECH  ?  Employer: [employer]  ?Tobacco Use  ? Smoking status: Never  ? Smokeless tobacco: Never  ?Vaping Use  ? Vaping Use: Never used  ?Substance and Sexual Activity  ? Alcohol use: No  ? Drug use: No  ? Sexual activity: Not on file  ?Other Topics Concern  ? Not on file  ?Social History Narrative  ? Reviewed history from 07/04/2009 and no changes required.  ? Married- is guardian for two grandchildren ages 13 and 16.  ? Never Smoked  ? Alcohol use-no  ? Regular exercise-no  ? Works in housekeeping at [employer]  ?   ?   ? ?Social Determinants of Health  ? ?Financial Resource Strain: Not on file  ?Food Insecurity: Not on file  ?Transportation Needs: Not on file  ?Physical Activity: Not on file  ?Stress: Not on file  ?Social Connections: Not on file  ?Intimate Partner Violence: Not on file  ? ? ?Family History  ?Problem Relation Age of Onset  ? Hypertension Father   ? Heart attack Father 47  ? Prostate cancer Father   ? Pulmonary embolism Mother   ?     deceased  ? Hypertension Mother   ? Diabetes Sister   ? Hypertension Brother   ? Hypertension Sister   ? Cancer Paternal Grandmother   ?     oral  ? Thyroid disease Sister   ?     1/2 sister thyroid problem  ? Healthy Daughter   ? Heart attack Brother 60  ? Breast cancer Cousin   ? Crohn's disease Neg Hx   ? Colon polyps Neg Hx   ? Esophageal cancer Neg Hx   ? Stomach cancer Neg Hx   ? Rectal cancer Neg Hx   ? ? ?ROS: no fevers or chills, productive  cough, hemoptysis, dysphasia, odynophagia, melena, hematochezia, dysuria, hematuria, rash, seizure activity, orthopnea, PND, pedal edema, claudication. Remaining systems are negative. ? ?Physical Exam:  ? ?Blood pressure 132/72, pulse 95, height 5' (1.524 m), weight 153 lb 1.3 oz (69.4 kg), SpO2 98 %. ? ?General:  Well developed/well nourished in NAD ?Skin warm/dry ?Patient not depressed ?No peripheral clubbing ?Back-normal ?HEENT-normal/normal eyelids ?Neck supple/normal carotid upstroke bilaterally; no bruits; no JVD; no thyromegaly ?chest - CTA/ normal expansion ?CV - RRR/normal S1 and  S2; no murmurs, rubs or gallops;  PMI nondisplaced ?Abdomen -NT/ND, no HSM, no mass, + bowel sounds, no bruit ?2+ femoral pulses, no bruits ?Ext-no edema, chords, 2+ DP ?Neuro-grossly nonfocal ? ?ECG -June 01, 2021-normal sinus rhythm with no ST changes.  Personally reviewed ? ?A/P ? ?1 left upper extremity pain/chest pain-symptoms atypical.  Electrocardiogram shows no ST changes.  However multiple risk factors including diabetes mellitus, hypertension, hyperlipidemia and strong family history.  I will arrange a CTA to exclude obstructive coronary disease. ? ?2 hypertension-blood pressure is now controlled.  We will continue present medications and adjust based on follow-up readings. ? ?3 hyperlipidemia-continue statin. ? ?Kirk Ruths, MD ? ?

## 2021-06-06 LAB — LIPID PANEL
Cholesterol: 134 mg/dL (ref <200)
HDL: 64 mg/dL (ref >=50)
LDL Cholesterol (Calc): 55 mg/dL (calc)
Non-HDL Cholesterol (Calc): 70 mg/dL (calc) (ref <130)
Total CHOL/HDL Ratio: 2.1 (calc) (ref <5.0)
Triglycerides: 69 mg/dL (ref <150)

## 2021-06-07 ENCOUNTER — Ambulatory Visit (INDEPENDENT_AMBULATORY_CARE_PROVIDER_SITE_OTHER): Payer: 59 | Admitting: Cardiology

## 2021-06-07 ENCOUNTER — Encounter: Payer: Self-pay | Admitting: Cardiology

## 2021-06-07 ENCOUNTER — Other Ambulatory Visit (HOSPITAL_BASED_OUTPATIENT_CLINIC_OR_DEPARTMENT_OTHER): Payer: Self-pay

## 2021-06-07 VITALS — BP 132/72 | HR 95 | Ht 60.0 in | Wt 153.1 lb

## 2021-06-07 DIAGNOSIS — R072 Precordial pain: Secondary | ICD-10-CM | POA: Diagnosis not present

## 2021-06-07 DIAGNOSIS — E7800 Pure hypercholesterolemia, unspecified: Secondary | ICD-10-CM | POA: Diagnosis not present

## 2021-06-07 DIAGNOSIS — I10 Essential (primary) hypertension: Secondary | ICD-10-CM

## 2021-06-07 MED ORDER — METOPROLOL TARTRATE 100 MG PO TABS
ORAL_TABLET | ORAL | 0 refills | Status: DC
  Filled 2021-06-07: qty 1, 1d supply, fill #0

## 2021-06-07 NOTE — Patient Instructions (Signed)
?Testing/Procedures: ? ? ?Your cardiac CT will be scheduled at  ? ?[facility] ?[address] ?[place], [place] [address] ?[phone] ? ?If scheduled at [facility], please arrive at the [facility] and Children's Entrance (Entrance C2) of [facility] 30 minutes prior to test start time. ?You can use the FREE valet parking offered at entrance C (encouraged to control the heart rate for the test)  ?Proceed to the [facility] Radiology Department (first floor) to check-in and test prep. ? ?All radiology patients and guests should use entrance C2 at [facility], accessed from [facility], even though the hospital's physical address listed is [address]. ? ? ? ? ?Please follow these instructions carefully (unless otherwise directed): ? ? ? ?On the Night Before the Test: ?Be sure to Drink plenty of water. ?Do not consume any caffeinated/decaffeinated beverages or chocolate 12 hours prior to your test. ?Do not take any antihistamines 12 hours prior to your test. ? ?On the Day of the Test: ?Drink plenty of water until 1 hour prior to the test. ?Do not eat any food 4 hours prior to the test. ?You may take your regular medications prior to the test.  ?Take metoprolol (Lopressor) 100 mg  two hours prior to test. ?HOLD Furosemide/Hydrochlorothiazide morning of the test. ?FEMALES- please wear underwire-free bra if available, avoid dresses & tight clothing ? ?     ?After the Test: ?Drink plenty of water. ?After receiving IV contrast, you may experience a mild flushed feeling. This is normal. ?On occasion, you may experience a mild rash up to 24 hours after the test. This is not dangerous. If this occurs, you can take Benadryl 25 mg and increase your fluid intake. ?If you experience trouble breathing, this can be serious. If it is severe call 911 IMMEDIATELY. If it is mild, please call our office. ?If you take any of these medications: Glipizide/Metformin, Avandament,  Glucavance, please do not take 48 hours after completing test unless otherwise instructed. ? ?We will call to schedule your test 2-4 weeks out understanding that some insurance companies will need an authorization prior to the service being performed.  ? ?For non-scheduling related questions, please contact the cardiac imaging nurse navigator should you have any questions/concerns: ?Marchia Bond, Cardiac Imaging Nurse Navigator ?Gordy Clement, Cardiac Imaging Nurse Navigator ?[facility] ?Direct Office Dial: [phone]  ? ?For scheduling needs, including cancellations and rescheduling, please call [place], [phone].  ? ? ?Follow-Up: ?At [facility], you and your health needs are our priority.  As part of our continuing mission to provide you with exceptional heart care, we have created designated Provider Care Teams.  These Care Teams include your primary Cardiologist (physician) and Advanced Practice Providers (APPs -  Physician Assistants and Nurse Practitioners) who all work together to provide you with the care you need, when you need it. ? ?We recommend signing up for the patient portal called "MyChart".  Sign up information is provided on this After Visit Summary.  MyChart is used to connect with patients for Virtual Visits (Telemedicine).  Patients are able to view lab/test results, encounter notes, upcoming appointments, etc.  Non-urgent messages can be sent to your provider as well.   ?To learn more about what you can do with MyChart, go to [email or web address].   ? ?Your next appointment:   ?12 month(s) ? ?The format for your next appointment:   ?In Person ? ?Provider:   ?Kirk Ruths, MD  ? ? ? ?  Important Information About Sugar ? ? ? ? ?  ?

## 2021-06-16 ENCOUNTER — Other Ambulatory Visit (HOSPITAL_BASED_OUTPATIENT_CLINIC_OR_DEPARTMENT_OTHER): Payer: Self-pay

## 2021-06-26 ENCOUNTER — Ambulatory Visit (HOSPITAL_COMMUNITY): Payer: 59

## 2021-06-30 ENCOUNTER — Telehealth (HOSPITAL_COMMUNITY): Payer: Self-pay | Admitting: Emergency Medicine

## 2021-06-30 NOTE — Telephone Encounter (Signed)
Reaching out to patient to offer assistance regarding upcoming cardiac imaging study; pt verbalizes understanding of appt date/time, parking situation and where to check in, pre-test NPO status and medications ordered, and verified current allergies; name and call back number provided for further questions should they arise ?Marchia Bond RN Navigator Cardiac Imaging ?[facility] ?[phone] office ?[phone] cell ? ?Denies iv issues ?14m metoprolol tartrate ?Arrival 1030 ? ?

## 2021-07-03 ENCOUNTER — Ambulatory Visit (HOSPITAL_COMMUNITY)
Admission: RE | Admit: 2021-07-03 | Discharge: 2021-07-03 | Disposition: A | Discharge status: Home / Self Care | Payer: 59 | Source: Ambulatory Visit | Attending: Cardiology | Admitting: Cardiology

## 2021-07-03 DIAGNOSIS — R072 Precordial pain: Secondary | ICD-10-CM | POA: Insufficient documentation

## 2021-07-03 MED ORDER — NITROGLYCERIN 0.4 MG SL SUBL
0.8000 mg | SUBLINGUAL_TABLET | Freq: Once | SUBLINGUAL | Status: AC
  Administered 2021-07-03: 0.8 mg via SUBLINGUAL

## 2021-07-03 MED ORDER — NITROGLYCERIN 0.4 MG SL SUBL
SUBLINGUAL_TABLET | SUBLINGUAL | Status: AC
  Filled 2021-07-03: qty 2

## 2021-07-03 MED ORDER — IOHEXOL 350 MG/ML SOLN
100.0000 mL | Freq: Once | INTRAVENOUS | Status: AC | PRN
  Administered 2021-07-03: 100 mL via INTRAVENOUS

## 2021-07-24 ENCOUNTER — Ambulatory Visit: Payer: 59 | Admitting: Medical-Surgical

## 2021-07-24 ENCOUNTER — Other Ambulatory Visit (HOSPITAL_BASED_OUTPATIENT_CLINIC_OR_DEPARTMENT_OTHER): Payer: Self-pay

## 2021-07-24 DIAGNOSIS — E7800 Pure hypercholesterolemia, unspecified: Secondary | ICD-10-CM

## 2021-07-24 DIAGNOSIS — R7303 Prediabetes: Secondary | ICD-10-CM

## 2021-07-24 DIAGNOSIS — I10 Essential (primary) hypertension: Secondary | ICD-10-CM

## 2021-08-10 ENCOUNTER — Encounter: Payer: Self-pay | Admitting: Medical-Surgical

## 2021-08-14 ENCOUNTER — Other Ambulatory Visit: Payer: Self-pay | Admitting: Cardiology

## 2021-08-16 ENCOUNTER — Other Ambulatory Visit (HOSPITAL_BASED_OUTPATIENT_CLINIC_OR_DEPARTMENT_OTHER): Payer: Self-pay

## 2021-08-21 ENCOUNTER — Other Ambulatory Visit: Payer: Self-pay | Admitting: Cardiology

## 2021-08-21 ENCOUNTER — Other Ambulatory Visit (HOSPITAL_BASED_OUTPATIENT_CLINIC_OR_DEPARTMENT_OTHER): Payer: Self-pay

## 2021-08-21 ENCOUNTER — Other Ambulatory Visit: Payer: Self-pay | Admitting: Family Medicine

## 2021-08-21 DIAGNOSIS — K219 Gastro-esophageal reflux disease without esophagitis: Secondary | ICD-10-CM

## 2021-08-23 ENCOUNTER — Other Ambulatory Visit (HOSPITAL_BASED_OUTPATIENT_CLINIC_OR_DEPARTMENT_OTHER): Payer: Self-pay

## 2021-08-23 MED ORDER — OMEPRAZOLE 40 MG PO CPDR
DELAYED_RELEASE_CAPSULE | Freq: Every day | ORAL | 1 refills | Status: DC
  Filled 2021-08-23: qty 90, 90d supply, fill #0
  Filled 2021-11-17: qty 90, 90d supply, fill #1

## 2021-08-23 NOTE — Telephone Encounter (Signed)
Next follow up on 12/11/2021. Never prescribed by Joy.

## 2021-09-11 NOTE — Therapy (Signed)
OUTPATIENT PHYSICAL THERAPY LOWER EXTREMITY EVALUATION   Patient Name: Whitney Neal MRN: [id number] DOB:May 13, 1960, 61 y.o., female Today's Date: 09/15/2021   PT End of Session - 09/15/21 0848     Visit Number 1    Number of Visits 9    Date for PT Re-Evaluation 10/20/21    Authorization Type WC - 1 eval & 8 treatment visits    Authorization - Visit Number 1    Authorization - Number of Visits 9    PT Start Time 0848    PT Stop Time 0942    PT Time Calculation (min) 54 min    Activity Tolerance Patient tolerated treatment well    Behavior During Therapy 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  Normal EGD 2011, 2014   Cervical lymphadenopathy    left   Cervical strain    Constipation    Delayed bile leak s/p ERCP/stenting 03/14/2017    Diabetes mellitus without complication (HCC)    on meds   GERD (gastroesophageal reflux disease)    on PRN   Hepatic abscesses s/p perc drainage 04/04/2017 04/04/2017   Hypercholesteremia    on meds   Hyperglycemia    a1c 6.3 7/12   Hyperplastic colonic polyp    history of    Hypertension    on meds   Migraine    Osteopenia    PONV (postoperative nausea and vomiting)    Ulcer 2010   Urinary incontinence    Vitamin D deficiency    Past Surgical History:  Procedure Laterality Date   ABDOMINAL HYSTERECTOMY  2009   Dr. Bethann Goo - benign disease   BLADDER SURGERY     bladder mesh surgery 2010- Dr. Rhodia Albright   BREAST CYST ASPIRATION     COLONOSCOPY W/ POLYPECTOMY  2011   ENDOSCOPIC RETROGRADE CHOLANGIOPANCREATOGRAPHY (ERCP) WITH PROPOFOL N/A 05/03/2017   Procedure: ENDOSCOPIC RETROGRADE CHOLANGIOPANCREATOGRAPHY (ERCP) WITH PROPOFOL;  Surgeon: Carol Ada, MD;  Location: [facility]  ENDOSCOPY;  Service: Endoscopy;  Laterality: N/A;   ERCP N/A 03/13/2017   Procedure: ENDOSCOPIC RETROGRADE CHOLANGIOPANCREATOGRAPHY (ERCP);  Surgeon: Carol Ada, MD;  Location: [place];  Service: Endoscopy;  Laterality: N/A;   ERCP N/A 03/14/2017   Procedure: ENDOSCOPIC RETROGRADE CHOLANGIOPANCREATOGRAPHY (ERCP);  Surgeon: Ladene Artist, MD;  Location: [facility];  Service: Endoscopy;  Laterality: N/A;   IR RADIOLOGIST EVAL & MGMT  04/16/2017   LAPAROSCOPIC CHOLECYSTECTOMY SINGLE PORT N/A 03/09/2017   Procedure: LAPAROSCOPIC CHOLECYSTECTOMY WITH INTRAOPERATIVE CHOLANGIOGRAM, CLOSURE OF ABERRANT DUCT OF LUSCHKA;  Surgeon: Michael Boston, MD;  Location: [facility];  Service: General;  Laterality: N/A;   Patient Active Problem List   Diagnosis Date Noted   Prediabetes 07/01/2018   Need for shingles vaccine 12/30/2017   Hepatic abscesses s/p perc drainage 04/04/2017 04/04/2017   Nausea & vomiting 04/02/2017   Delayed bile leak s/p ERCP/stenting 03/14/2017    Acute on chronic cholecystitis s/p lap cholecystectomy 03/09/2017 03/09/2017   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 s/p ligation 03/09/2017 03/09/2017   Constipation    Hemangioma of liver 02/04/2017   Migraine 10/01/2011   Vaginal atrophy 08/21/2011   Numbness and tingling in left hand 01/05/2011  Essential hypertension 01/25/2010   GERD 01/25/2010   COLONIC POLYPS, HYPERPLASTIC, HX OF 01/25/2010   CERVICAL STRAIN 11/23/2009   Hyperlipidemia 09/26/2009   Osteopenia 07/11/2009   GASTRIC ULCER, HX OF 07/11/2009   History of Barrett's esophagus 07/11/2009   Vitamin D deficiency 07/04/2009    PCP: Whitney Bouche, NP  REFERRING PROVIDER: Renette Butters, MD  REFERRING DIAG: [phone] (ICD-10-CM) - IT band syndrome, left  THERAPY DIAG:  Acute pain of left knee  Other symptoms and signs involving the musculoskeletal system  Muscle weakness (generalized)  RATIONALE FOR EVALUATION AND TREATMENT: Rehabilitation  ONSET DATE:  08/14/21  NEXT MD VISIT: 10/09/21   SUBJECTIVE:   SUBJECTIVE STATEMENT: Pt reports she was going through a mandatory training program at work on 08/14/21 to deal with aggressive or mentally unstable patients when she twisted her L knee. 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 PT ordered for 2x/wk x 4 weeks (8 visits) - approved by WC.  PAIN:  Are you having pain? Yes: NPRS scale:  1/10 Pain location: L knee lateral joint line Pain description: pinching Aggravating factors: pressure/touch, lifting patients to transfer from bed to stretcher Relieving factors: initially used ice and ibuprofen; now only occasionally takes Tylenol  PERTINENT HISTORY: Osteopenia, DM, HTN, cervical lymphadenopathy, cervical strain, GERD, h/o gastric ulcer, HLD, migraine, urinary incontinence, abdominal hysterectomy, lap cholecystectomy, bladder surgery  PRECAUTIONS: None   WEIGHT BEARING RESTRICTIONS: No  FALLS:  Has patient fallen in last 6 months? 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  PATIENT SURVEYS:  FOTO Knee = 80; predicted = 77  COGNITION: Overall cognitive status: Within functional limits for tasks assessed     SENSATION: WFL  EDEMA:  N/A  MUSCLE LENGTH: Hamstrings: mod tight L>R ITB: mod tight L, min/mod tight L Piriformis: mod tight L>R Hip flexors: min tight L>R Quads: mod tight L>R Heelcord: mild tight L>R  POSTURE:  B genu  recurvatum  PALPATION: TTP over L knee lateral joint line, ITB and greater trochanter  LOWER EXTREMITY ROM:  Active ROM Right Eval 09/15/21 Left Eval 09/15/21  Knee flexion 136 136  Knee extension +5 hyperextension +5 hyperextension    LOWER EXTREMITY MMT:  MMT Right Eval 09/15/21 Left Eval 09/15/21  Hip flexion 5 4  Hip extension 4+ 4  Hip abduction 4+ 4-  Hip adduction 4+ 4  Hip internal rotation 4+ 4 *  Hip external rotation 4+ 4- *  Knee flexion 5 4+  Knee extension 5 4+  Ankle dorsiflexion 5 4  Ankle plantarflexion 4 13 SLS HR 4- 8 SLS HR  Ankle inversion    Ankle eversion     (Blank rows = not tested)  LOWER EXTREMITY SPECIAL TESTS:  Hip special tests: Ober's test: positive  L>R  GAIT: Assistive device utilized: None Level of assistance: Complete Independence Gait pattern: WFL   TODAY'S TREATMENT:  09/15/21 THERAPEUTIC EXERCISE: Instruction in initial HEP (  see below) to improve flexibility, strength and mobility.  Verbal and tactile cues throughout for technique. Hooklying L HS stretch with strap + DF for calf stretch x 30 sec  Supine L cross body ITB Stretch with strap x 30 sec  Hooklying L KTOS piriformis stretch x 30 sec  Hooklying figure-4 to chest piriformis stretch x 30 sec  Sidelying L clamshell 10 x 3" Sidelying L hip abduction with slight diagonal into slight hip extension 10 x 3"   PATIENT EDUCATION:  Education details: PT eval findings, anticipated POC, and initial HEP Person educated: Patient Education method: Explanation, Demonstration, Tactile cues, Verbal cues, and Handouts Education comprehension: verbalized understanding, returned demonstration, verbal cues required, tactile cues required, and needs further education   HOME EXERCISE PROGRAM: Access Code: [email or web address] URL: [email or web address] Date: 09/15/2021 Prepared by: Annie Paras  Exercises - Supine Hamstring Stretch with Strap  - 2-3 x daily - 7 x weekly  - 3 reps - 30 sec hold - Supine ITB Stretch with Strap (Mirrored)  - 2-3 x daily - 7 x weekly - 3 reps - 30 sec hold - Supine Piriformis Stretch with Foot on Ground (Mirrored)  - 2-3 x daily - 7 x weekly - 3 reps - 30 sec hold - Supine Figure 4 Piriformis Stretch  - 2-3 x daily - 7 x weekly - 3 reps - 30 sec hold - Clamshell (Mirrored)  - 1 x daily - 7 x weekly - 2 sets - 10 reps - 3 sec hold - Sidelying Diagonal Hip Abduction (Mirrored)  - 1 x daily - 7 x weekly - 2 sets - 10 reps - 3 sec hold  ASSESSMENT:  CLINICAL IMPRESSION: Whitney Neal is a 61 y.o. female who was seen today for physical therapy evaluation and treatment for a worker's comp injury to her L knee and ITB sustained during mandatory training at work on 08/14/21. She reports she was learning how to manage aggressive or mentally impaired patients requiring physical restraint when she twisted her L knee and felt a sudden onset of pain along the lateral joint line. She took ibuprofen and iced her knee following the injury. Pain has been subsiding and she no longer has to take ibuprofen or use the ice but she still takes the occasional Tylenol and/or uses the topical analgesic provided by the MD. L knee ROM WNL and symmetrical to R. Deficits including mild pain along the lateral joint line of her L knee, TTP over the same area as well as distal L ITB and L greater trochanter; decreased proximal LE flexibility especially in L ITB with positive Ober's test L>R; and L>R LE weakness. 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 Azriella "[place]" will benefit from skilled PT to address above deficits to allow ability to return to work and normal daily activities w/o limitation due to L knee pain.  OBJECTIVE IMPAIRMENTS: decreased activity tolerance, decreased knowledge of condition, decreased mobility, difficulty walking, decreased  strength, increased fascial restrictions, impaired perceived functional ability, increased muscle spasms, impaired flexibility, improper body mechanics, postural dysfunction, and pain.   ACTIVITY LIMITATIONS: carrying, lifting, bending, squatting, transfers, locomotion level, and caring for others  PARTICIPATION LIMITATIONS: community activity and occupation  PERSONAL FACTORS: Age, Fitness, Time since onset of injury/illness/exacerbation, and 3+ comorbidities: Osteopenia, DM, HTN, cervical lymphadenopathy, cervical strain, GERD, h/o gastric ulcer, HLD, migraine, urinary incontinence, abdominal hysterectomy, lap  cholecystectomy, bladder surgery  are also affecting patient's functional outcome.   REHAB POTENTIAL: Excellent  CLINICAL DECISION MAKING: Stable/uncomplicated  EVALUATION COMPLEXITY: Low   GOALS: Goals reviewed with patient? Yes  SHORT TERM GOALS: Target date: 10/06/2021   Patient will be independent with initial HEP. Baseline: Initial HEP provided on eval Goal status: INITIAL  LONG TERM GOALS: Target date: 10/20/2021   Patient will be independent with advanced/ongoing HEP to improve outcomes and carryover.  Baseline: Initial HEP provided on eval Goal status: INITIAL  2.  Patient will report at least 75% improvement in L knee pain to improve QOL. Baseline: 1/10 on eval but increases with patients transfers while at work Goal status: INITIAL  3.  Patient will demonstrate improved B LE strength to >/= 4+/5 for improved stability and ease of mobility. Baseline: Refer to above MMT chart Goal status: INITIAL  4. Patient will be able to ascend/descend stairs with 1 HR and reciprocal step pattern safely to access home and community.  Baseline:  Goal status: INITIAL  5.  Patient will report ability to resume walking for exercise w/o limitation due to L knee pain or weakness Baseline:  Goal status: INITIAL  6.  Patient to report ability to perform ADLs, household, and  work-related tasks including patient transfers without limitation due to L knee pain or weakness Baseline:  Goal status: INITIAL   PLAN: PT FREQUENCY: 2x/week  PT DURATION: 4 weeks  PLANNED INTERVENTIONS: Therapeutic exercises, Therapeutic activity, Neuromuscular re-education, Balance training, Gait training, Patient/Family education, Self Care, Joint mobilization, Stair training, Dry Needling, Electrical stimulation, Cryotherapy, Moist heat, Taping, Vasopneumatic device, Ultrasound, Ionotophoresis 73m/ml Dexamethasone, Manual therapy, and Re-evaluation  PLAN FOR NEXT SESSION: Review initial HEP; progress LE flexibility and strengthening; MT +/- DN to address increased muscle tension/tightness; possible Ktape for L ITB; modalities including ionto as indicated   JPercival Spanish PT 09/15/2021, 11:36 AM

## 2021-09-15 ENCOUNTER — Encounter: Payer: Self-pay | Admitting: Physical Therapy

## 2021-09-15 ENCOUNTER — Other Ambulatory Visit: Payer: Self-pay

## 2021-09-15 ENCOUNTER — Ambulatory Visit: Payer: PRIVATE HEALTH INSURANCE | Attending: Orthopedic Surgery | Admitting: Physical Therapy

## 2021-09-15 DIAGNOSIS — M25562 Pain in left knee: Secondary | ICD-10-CM | POA: Diagnosis not present

## 2021-09-15 DIAGNOSIS — R29898 Other symptoms and signs involving the musculoskeletal system: Secondary | ICD-10-CM | POA: Diagnosis present

## 2021-09-15 DIAGNOSIS — M6281 Muscle weakness (generalized): Secondary | ICD-10-CM | POA: Diagnosis present

## 2021-09-21 ENCOUNTER — Ambulatory Visit: Payer: Self-pay | Admitting: Physical Therapy

## 2021-09-22 ENCOUNTER — Encounter: Payer: 59 | Admitting: Physical Therapy

## 2021-09-24 ENCOUNTER — Other Ambulatory Visit: Payer: Self-pay | Admitting: Osteopathic Medicine

## 2021-09-24 DIAGNOSIS — R7303 Prediabetes: Secondary | ICD-10-CM

## 2021-09-25 ENCOUNTER — Other Ambulatory Visit: Payer: Self-pay | Admitting: Medical-Surgical

## 2021-09-25 ENCOUNTER — Other Ambulatory Visit (HOSPITAL_BASED_OUTPATIENT_CLINIC_OR_DEPARTMENT_OTHER): Payer: Self-pay

## 2021-09-25 DIAGNOSIS — Z1231 Encounter for screening mammogram for malignant neoplasm of breast: Secondary | ICD-10-CM

## 2021-09-25 MED ORDER — METFORMIN HCL ER 500 MG PO TB24
ORAL_TABLET | Freq: Every day | ORAL | 1 refills | Status: DC
  Filled 2021-09-25: qty 90, 90d supply, fill #0

## 2021-09-26 ENCOUNTER — Other Ambulatory Visit (HOSPITAL_BASED_OUTPATIENT_CLINIC_OR_DEPARTMENT_OTHER): Payer: Self-pay

## 2021-09-28 ENCOUNTER — Ambulatory Visit: Payer: PRIVATE HEALTH INSURANCE | Attending: Orthopedic Surgery

## 2021-09-28 DIAGNOSIS — R29898 Other symptoms and signs involving the musculoskeletal system: Secondary | ICD-10-CM | POA: Diagnosis present

## 2021-09-28 DIAGNOSIS — M6281 Muscle weakness (generalized): Secondary | ICD-10-CM | POA: Insufficient documentation

## 2021-09-28 DIAGNOSIS — M25562 Pain in left knee: Secondary | ICD-10-CM | POA: Diagnosis present

## 2021-09-28 NOTE — Therapy (Signed)
OUTPATIENT PHYSICAL THERAPY TREATMENT   Patient Name: KARREN NEWLAND MRN: [id number] DOB:[birth year], 61 y.o., female Today's Date: 09/28/2021   PT End of Session - 09/28/21 1015     Visit Number 2    Number of Visits 9    Date for PT Re-Evaluation 10/20/21    Authorization Type WC - 1 eval & 8 treatment visits    Authorization - Visit Number 2    Authorization - Number of Visits 9    PT Start Time 0933    PT Stop Time 6546    PT Time Calculation (min) 41 min    Activity Tolerance Patient tolerated treatment well    Behavior During Therapy [facility] for tasks assessed/performed              Past Medical History:  Diagnosis Date   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  Normal EGD 2011, 2014   Cervical lymphadenopathy    left   Cervical strain    Constipation    Delayed bile leak s/p ERCP/stenting 03/14/2017    Diabetes mellitus without complication (HCC)    on meds   GERD (gastroesophageal reflux disease)    on PRN   Hepatic abscesses s/p perc drainage 04/04/2017 04/04/2017   Hypercholesteremia    on meds   Hyperglycemia    a1c 6.3 7/12   Hyperplastic colonic polyp    history of    Hypertension    on meds   Migraine    Osteopenia    PONV (postoperative nausea and vomiting)    Ulcer 2010   Urinary incontinence    Vitamin D deficiency    Past Surgical History:  Procedure Laterality Date   ABDOMINAL HYSTERECTOMY  2009   Dr. Bethann Goo - benign disease   BLADDER SURGERY     bladder mesh surgery 2010- Dr. Rhodia Albright   BREAST CYST ASPIRATION     COLONOSCOPY W/ POLYPECTOMY  2011   ENDOSCOPIC RETROGRADE CHOLANGIOPANCREATOGRAPHY (ERCP) WITH PROPOFOL N/A 05/03/2017   Procedure: ENDOSCOPIC RETROGRADE CHOLANGIOPANCREATOGRAPHY (ERCP) WITH PROPOFOL;  Surgeon: Carol Ada, MD;  Location: [facility];   Service: Endoscopy;  Laterality: N/A;   ERCP N/A 03/13/2017   Procedure: ENDOSCOPIC RETROGRADE CHOLANGIOPANCREATOGRAPHY (ERCP);  Surgeon: Carol Ada, MD;  Location: [facility];  Service: Endoscopy;  Laterality: N/A;   ERCP N/A 03/14/2017   Procedure: ENDOSCOPIC RETROGRADE CHOLANGIOPANCREATOGRAPHY (ERCP);  Surgeon: Ladene Artist, MD;  Location: [facility];  Service: Endoscopy;  Laterality: N/A;   IR RADIOLOGIST EVAL & MGMT  04/16/2017   LAPAROSCOPIC CHOLECYSTECTOMY SINGLE PORT N/A 03/09/2017   Procedure: LAPAROSCOPIC CHOLECYSTECTOMY WITH INTRAOPERATIVE CHOLANGIOGRAM, CLOSURE OF ABERRANT DUCT OF LUSCHKA;  Surgeon: Michael Boston, MD;  Location: [facility];  Service: General;  Laterality: N/A;   Patient Active Problem List   Diagnosis Date Noted   Prediabetes 07/01/2018   Need for shingles vaccine 12/30/2017   Hepatic abscesses s/p perc drainage 04/04/2017 04/04/2017   Nausea & vomiting 04/02/2017   Delayed bile leak s/p ERCP/stenting 03/14/2017    Acute on chronic cholecystitis s/p lap cholecystectomy 03/09/2017 03/09/2017   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 s/p ligation 03/09/2017 03/09/2017   Constipation    Hemangioma of liver 02/04/2017   Migraine 10/01/2011   Vaginal atrophy 08/21/2011   Numbness and tingling in left hand 01/05/2011   Essential  hypertension 01/25/2010   GERD 01/25/2010   COLONIC POLYPS, HYPERPLASTIC, HX OF 01/25/2010   CERVICAL STRAIN 11/23/2009   Hyperlipidemia 09/26/2009   Osteopenia 07/11/2009   GASTRIC ULCER, HX OF 07/11/2009   History of Barrett's esophagus 07/11/2009   Vitamin D deficiency 07/04/2009    PCP: Samuel Bouche, NP  REFERRING PROVIDER: Renette Butters, MD  REFERRING DIAG: [phone] (ICD-10-CM) - IT band syndrome, left  THERAPY DIAG:  Acute pain of left knee  Other symptoms and signs involving the musculoskeletal system  Muscle weakness (generalized)  RATIONALE FOR EVALUATION AND TREATMENT: Rehabilitation  ONSET DATE:  08/14/21  NEXT MD VISIT: 10/09/21   SUBJECTIVE:   SUBJECTIVE STATEMENT: Pt reports her pain has not been an issue lately ever since doing HEP, she even feels that she can wrap up PT today.  PAIN:  Are you having pain? 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  PATIENT SURVEYS:  FOTO Knee = 80; predicted = 77  COGNITION: Overall cognitive status: Within functional limits for tasks assessed     SENSATION: WFL  EDEMA:  N/A  MUSCLE LENGTH: Hamstrings: mod tight L>R ITB: mod tight L, min/mod tight L Piriformis: mod tight L>R Hip flexors: min tight L>R Quads: mod tight L>R Heelcord: mild tight L>R  POSTURE:  B genu recurvatum  PALPATION: TTP over L knee lateral joint line, ITB and greater trochanter  LOWER EXTREMITY ROM:  Active ROM Right Eval 09/15/21 Left Eval 09/15/21  Knee flexion 136 136  Knee extension +5 hyperextension +5 hyperextension    LOWER EXTREMITY  MMT:  MMT Right Eval 09/15/21 Left Eval 09/15/21  Hip flexion 5 4  Hip extension 4+ 4  Hip abduction 4+ 4-  Hip adduction 4+ 4  Hip internal rotation 4+ 4 *  Hip external rotation 4+ 4- *  Knee flexion 5 4+  Knee extension 5 4+  Ankle dorsiflexion 5 4  Ankle plantarflexion 4 13 SLS HR 4- 8 SLS HR  Ankle inversion    Ankle eversion     (Blank rows = not tested)  LOWER EXTREMITY SPECIAL TESTS:  Hip special tests: Ober's test: positive  L>R  GAIT: Assistive device utilized: None Level of assistance: Complete Independence Gait pattern: WFL   TODAY'S TREATMENT: 09/28/21 Therapeutic Exercise: Rec Bike L1x52mn SLS with opp leg reach 4 way on colored dots 5x each Standing hip flex, abd, ext x 10 w/ Red TB 2HA Seated LAQ w/ red TB 2x10 Seated LAQ w/ ball squeeze x 10 Ecc fwd step down on 4' step x 10 1HA Mini squat w/ bottom touch  to bolster x 10  09/15/21 THERAPEUTIC EXERCISE: Instruction in initial HEP (see below) to improve flexibility, strength and mobility.  Verbal and tactile cues throughout for technique. Hooklying L HS stretch with strap + DF for calf stretch x 30 sec  Supine L cross body ITB Stretch with strap x 30 sec  Hooklying L KTOS piriformis stretch x 30 sec  Hooklying figure-4 to chest piriformis stretch x 30 sec  Sidelying L clamshell 10 x 3" Sidelying L hip abduction with slight diagonal into slight hip extension 10 x 3"   PATIENT EDUCATION:  Education details: PT eval findings, anticipated POC, and initial HEP Person educated: Patient Education method: Explanation, Demonstration, Tactile cues, Verbal cues, and Handouts Education comprehension: verbalized understanding, returned demonstration, verbal cues required, tactile cues required, and needs further education   HOME EXERCISE PROGRAM: Access Code: [email or web address] URL: [email or web address] Date: 09/28/2021 Prepared by: Clarene Essex  Exercises - Supine Hamstring Stretch with Strap  -  2-3 x daily - 7 x weekly - 3 reps - 30 sec hold - Supine ITB Stretch with Strap (Mirrored)  - 2-3 x daily - 7 x weekly - 3 reps - 30 sec hold - Supine Piriformis Stretch with Foot on Ground (Mirrored)  - 2-3 x daily - 7 x weekly - 3 reps - 30 sec hold - Supine Figure 4 Piriformis Stretch  - 2-3 x daily - 7 x weekly - 3 reps - 30 sec hold - Clamshell (Mirrored)  - 1 x daily - 7 x weekly - 2 sets - 10 reps - 3 sec hold - Sidelying Diagonal Hip Abduction (Mirrored)  - 1 x daily - 7 x weekly - 2 sets - 10 reps - 3 sec hold - Mini Squat with Chair  - 1 x daily - 3 x weekly - 2 sets - 10 reps - Hip Extension with Resistance Loop  - 1 x daily - 3 x weekly - 2 sets - 10 reps - Standing Hip Flexion with Resistance Loop  - 1 x daily - 3 x weekly - 2 sets - 10 reps - Hip Abduction with Resistance Loop  - 1 x daily - 3 x weekly - 2 sets - 10 reps  ASSESSMENT:  CLINICAL IMPRESSION: Pt reportedly has not had pain since eval. We were able to progress strengthening focusing on proximal stability and quad isolated exercises. She showed a good response with cues provided throughout session to correct form. Updated HEP and progressed exercises w/o issue  OBJECTIVE IMPAIRMENTS: decreased activity tolerance, decreased knowledge of condition, decreased mobility, difficulty walking, decreased strength, increased fascial restrictions, impaired perceived functional ability, increased muscle spasms, impaired flexibility, improper body mechanics, postural dysfunction, and pain.   ACTIVITY LIMITATIONS: carrying, lifting, bending, squatting, transfers, locomotion level, and caring for others  PARTICIPATION LIMITATIONS: community activity and occupation  PERSONAL FACTORS: Age, Fitness, Time since onset of injury/illness/exacerbation, and 3+ comorbidities: Osteopenia, DM, HTN, cervical lymphadenopathy, cervical strain, GERD, h/o gastric ulcer, HLD, migraine, urinary incontinence, abdominal hysterectomy, lap cholecystectomy,  bladder surgery  are also affecting patient's functional outcome.   REHAB POTENTIAL: Excellent  CLINICAL DECISION MAKING: Stable/uncomplicated  EVALUATION COMPLEXITY: Low   GOALS: Goals reviewed with patient? Yes  SHORT TERM GOALS: Target date: 10/06/2021   Patient will be independent with initial HEP. Baseline: Initial HEP provided on eval Goal status: INITIAL  LONG TERM GOALS: Target date: 10/20/2021   Patient will be independent with advanced/ongoing HEP to improve outcomes and carryover.  Baseline: Initial HEP provided on  eval Goal status: INITIAL  2.  Patient will report at least 75% improvement in L knee pain to improve QOL. Baseline: 1/10 on eval but increases with patients transfers while at work Goal status: INITIAL  3.  Patient will demonstrate improved B LE strength to >/= 4+/5 for improved stability and ease of mobility. Baseline: Refer to above MMT chart Goal status: INITIAL  4. Patient will be able to ascend/descend stairs with 1 HR and reciprocal step pattern safely to access home and community.  Baseline:  Goal status: INITIAL  5.  Patient will report ability to resume walking for exercise w/o limitation due to L knee pain or weakness Baseline:  Goal status: INITIAL  6.  Patient to report ability to perform ADLs, household, and work-related tasks including patient transfers without limitation due to L knee pain or weakness Baseline:  Goal status: INITIAL   PLAN: PT FREQUENCY: 2x/week  PT DURATION: 4 weeks  PLANNED INTERVENTIONS: Therapeutic exercises, Therapeutic activity, Neuromuscular re-education, Balance training, Gait training, Patient/Family education, Self Care, Joint mobilization, Stair training, Dry Needling, Electrical stimulation, Cryotherapy, Moist heat, Taping, Vasopneumatic device, Ultrasound, Ionotophoresis 28m/ml Dexamethasone, Manual therapy, and Re-evaluation  PLAN FOR NEXT SESSION: Review progressedHEP; progress LE flexibility and  strengthening; MT +/- DN to address increased muscle tension/tightness; possible Ktape for L ITB; modalities including ionto as indicated   BArtist Pais PTA 09/28/2021, 10:16 AM

## 2021-09-29 ENCOUNTER — Encounter: Payer: Self-pay | Admitting: Physical Therapy

## 2021-09-29 ENCOUNTER — Ambulatory Visit: Payer: PRIVATE HEALTH INSURANCE | Admitting: Physical Therapy

## 2021-09-29 DIAGNOSIS — M25562 Pain in left knee: Secondary | ICD-10-CM

## 2021-09-29 DIAGNOSIS — M6281 Muscle weakness (generalized): Secondary | ICD-10-CM

## 2021-09-29 DIAGNOSIS — R29898 Other symptoms and signs involving the musculoskeletal system: Secondary | ICD-10-CM

## 2021-09-29 NOTE — Therapy (Addendum)
OUTPATIENT PHYSICAL THERAPY TREATMENT / PROGRESS NOTE / DISCHARGE SUMMARY   Patient Name: Whitney Neal MRN: [id number] DOB:12/16/1960, 61 y.o., female Today's Date: 09/29/2021  Progress Note  Reporting Period 09/15/2021 to 09/29/2021  See note below for Objective Data and Assessment of Progress/Goals.       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  Normal EGD 2011, 2014   Cervical lymphadenopathy    left   Cervical strain    Constipation    Delayed bile leak s/p ERCP/stenting 03/14/2017    Diabetes mellitus without complication (HCC)    on meds   GERD (gastroesophageal reflux disease)    on PRN   Hepatic abscesses s/p perc drainage 04/04/2017 04/04/2017   Hypercholesteremia    on meds   Hyperglycemia    a1c 6.3 7/12   Hyperplastic colonic polyp    history of    Hypertension    on meds   Migraine    Osteopenia    PONV (postoperative nausea and vomiting)    Ulcer 2010   Urinary incontinence    Vitamin D deficiency    Past Surgical History:  Procedure Laterality Date   ABDOMINAL HYSTERECTOMY  2009   Dr. Bethann Goo - benign disease   BLADDER SURGERY     bladder mesh surgery 2010- Dr. Rhodia Albright   BREAST CYST ASPIRATION     COLONOSCOPY W/ POLYPECTOMY  2011   ENDOSCOPIC RETROGRADE CHOLANGIOPANCREATOGRAPHY  (ERCP) WITH PROPOFOL N/A 05/03/2017   Procedure: ENDOSCOPIC RETROGRADE CHOLANGIOPANCREATOGRAPHY (ERCP) WITH PROPOFOL;  Surgeon: Carol Ada, MD;  Location: [facility];  Service: Endoscopy;  Laterality: N/A;   ERCP N/A 03/13/2017   Procedure: ENDOSCOPIC RETROGRADE CHOLANGIOPANCREATOGRAPHY (ERCP);  Surgeon: Carol Ada, MD;  Location: [facility];  Service: Endoscopy;  Laterality: N/A;   ERCP N/A 03/14/2017   Procedure: ENDOSCOPIC RETROGRADE CHOLANGIOPANCREATOGRAPHY (ERCP);  Surgeon: Ladene Artist, MD;  Location: [facility];  Service: Endoscopy;  Laterality: N/A;   IR RADIOLOGIST EVAL & MGMT  04/16/2017   LAPAROSCOPIC CHOLECYSTECTOMY SINGLE PORT N/A 03/09/2017   Procedure: LAPAROSCOPIC CHOLECYSTECTOMY WITH INTRAOPERATIVE CHOLANGIOGRAM, CLOSURE OF ABERRANT DUCT OF LUSCHKA;  Surgeon: Michael Boston, MD;  Location: [facility];  Service: General;  Laterality: N/A;   Patient Active Problem List   Diagnosis Date Noted   Prediabetes 07/01/2018   Need for shingles vaccine 12/30/2017   Hepatic abscesses s/p perc drainage 04/04/2017 04/04/2017   Nausea & vomiting 04/02/2017   Delayed bile leak s/p ERCP/stenting 03/14/2017    Acute on chronic cholecystitis s/p lap cholecystectomy 03/09/2017 03/09/2017   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 s/p ligation 03/09/2017 03/09/2017  Constipation    Hemangioma of liver 02/04/2017   Migraine 10/01/2011   Vaginal atrophy 08/21/2011   Numbness and tingling in left hand 01/05/2011   Essential hypertension 01/25/2010   GERD 01/25/2010   COLONIC POLYPS, HYPERPLASTIC, HX OF 01/25/2010   CERVICAL STRAIN 11/23/2009   Hyperlipidemia 09/26/2009   Osteopenia 07/11/2009   GASTRIC ULCER, HX OF 07/11/2009   History of Barrett's esophagus 07/11/2009   Vitamin D deficiency 07/04/2009    PCP: Samuel Bouche, NP  REFERRING PROVIDER: Renette Butters, MD  REFERRING DIAG: [phone] (ICD-10-CM) - IT band syndrome, left  THERAPY DIAG:  Acute pain of left knee  Other  symptoms and signs involving the musculoskeletal system  Muscle weakness (generalized)  RATIONALE FOR EVALUATION AND TREATMENT: Rehabilitation  ONSET DATE: 08/14/21  NEXT MD VISIT: 10/09/21   SUBJECTIVE:   SUBJECTIVE STATEMENT: Pt reports her pain has not been an issue lately ever since doing HEP. Sometimes feels tired in her back but feels that she can wrap up PT today.  PAIN:  Are you having pain? No  PERTINENT HISTORY: Osteopenia, DM, HTN, cervical lymphadenopathy, cervical strain, GERD, h/o gastric ulcer, HLD, migraine, urinary incontinence, abdominal hysterectomy, lap cholecystectomy, bladder surgery  PRECAUTIONS: None   WEIGHT BEARING RESTRICTIONS: No  FALLS:  Has patient fallen in last 6 months? 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  PATIENT SURVEYS:  FOTO Knee = 80; predicted = 77  COGNITION: Overall cognitive status: Within functional limits for tasks assessed     SENSATION: WFL  EDEMA:  N/A  MUSCLE LENGTH: Hamstrings: mod tight L>R ITB: mod tight L, min/mod tight L Piriformis: mod tight L>R Hip flexors: min tight L>R Quads: mod tight L>R Heelcord: mild tight L>R  POSTURE:  B genu recurvatum  PALPATION: TTP over L knee lateral joint line, ITB and greater trochanter  LOWER EXTREMITY ROM:  Active ROM Right Eval 09/15/21 Left Eval 09/15/21  Knee flexion 136 136  Knee extension +5 hyperextension +5 hyperextension    LOWER EXTREMITY MMT:  MMT Right Eval 09/15/21 Left Eval 09/15/21  Right 09/29/21   Left 09/29/21  Hip flexion 5 4 4+ 4+  Hip extension 4+ 4 5 5   Hip abduction 4+ 4- 4+ 4+  Hip adduction 4+ 4 5 5   Hip internal rotation 4+ 4 * 4+ 4+  Hip external rotation 4+ 4- * 4+ 4+  Knee flexion 5 4+ 5 5  Knee extension 5 4+ 5 5  Ankle dorsiflexion 5 4 5 5   Ankle plantarflexion 4 13 SLS HR 4- 8 SLS HR 5 20 SLS HR 5 20 SLS HR  Ankle inversion      Ankle eversion       (Blank rows = not tested) *-pain with resistance  LOWER EXTREMITY SPECIAL TESTS:  Hip special tests: Ober's test: positive  L>R  GAIT: Assistive device utilized: None Level of assistance: Complete Independence Gait pattern: WFL   TODAY'S TREATMENT:  09/29/21 THERAPEUTIC EXERCISE: to improve flexibility, strength and mobility.  Verbal and tactile cues throughout for technique. Hooklying L  HS stretch with strap + DF for calf stretch x 30 sec  Supine L cross body ITB Stretch with strap x 30 sec  Hooklying and seated L KTOS piriformis stretch x 30 sec each Hooklying figure-4 to chest piriformis stretch x 30 sec  Sidelying B RTB clam 10 x 3" Mini squat w/ bottom touch to bolster x 10  THERAPEUTIC ACTIVITIES: MMT Stairs: 1 flight w/o rail with good reciprocal pattern   09/28/21 Therapeutic Exercise: Rec Bike L1x29mn SLS with opp leg reach 4 way on colored dots 5x each Standing hip flex, abd, ext x 10 w/ Red TB 2HA Seated LAQ w/ red TB 2x10 Seated LAQ w/ ball squeeze x 10 Ecc fwd step down on 4' step x 10 1HA Mini squat w/ bottom touch to bolster x 10   09/15/21 THERAPEUTIC EXERCISE: Instruction in initial HEP (see below) to improve flexibility, strength and mobility.  Verbal and tactile cues throughout for technique. Hooklying L HS stretch with strap + DF for calf stretch x 30 sec  Supine L cross body ITB Stretch with strap x 30 sec  Hooklying L KTOS piriformis stretch x 30 sec  Hooklying figure-4 to chest piriformis stretch x 30 sec  Sidelying L clamshell 10 x 3" Sidelying L hip abduction with  slight diagonal into slight hip extension 10 x 3"   PATIENT EDUCATION:  Education details: HEP update and recommended frequency for ongoing HEP at discharge to prevent loss of gains achieved with PT Person educated: Patient Education method: Explanation, Demonstration, and Handouts Education comprehension: verbalized understanding and returned demonstration   HOME EXERCISE PROGRAM: Access Code: [email or web address]: [email or web address] Date: 09/29/2021 Prepared by: JAnnie Paras Exercises - Supine Hamstring Stretch with Strap  - 2-3 x daily - 7 x weekly - 3 reps - 30 sec hold - Supine ITB Stretch with Strap (Mirrored)  - 2-3 x daily - 7 x weekly - 3 reps - 30 sec hold - Supine Piriformis Stretch with Foot on Ground (Mirrored)  - 2-3 x daily - 7 x weekly - 3 reps - 30 sec hold - Supine Figure 4 Piriformis Stretch  - 2-3 x daily - 7 x weekly - 3 reps - 30 sec hold - Sidelying Diagonal Hip Abduction (Mirrored)  - 1 x daily - 7 x weekly - 2 sets - 10 reps - 3 sec hold - Mini Squat with Chair  - 1 x daily - 3 x weekly - 2 sets - 10 reps - Hip Extension with Resistance Loop  - 1 x daily - 3 x weekly - 2 sets - 10 reps - Standing Hip Flexion with Resistance Loop  - 1 x daily - 3 x weekly - 2 sets - 10 reps - Hip Abduction with Resistance Loop  - 1 x daily - 3 x weekly - 2 sets - 10 reps - Clam with Resistance  - 1 x daily - 3 x weekly - 2 sets - 10 reps - 3-5 sec hold  ASSESSMENT:  CLINICAL IMPRESSION: JShaia"Juanita" reports her L leg is feeling much better with no pain since she has been working on the [employer] exercises. She notes no limitations with walking or climbing stairs and has been able to complete all work-related tasks including patient handling w/o limitations due to pain or weakness. Her overall L strength has improved to 4+/5 to 5/5 with L LE now essentially symmetrical to R. She has met all of her goals and feels ready to transition  to her HEP at this time,  but will remain on a 30-day hold in the event that issues arise or concerns identified at her f/u with the MD on 10/09/21 that would necessitate a return to PT.  OBJECTIVE IMPAIRMENTS: decreased activity tolerance, decreased knowledge of condition, decreased mobility, difficulty walking, decreased strength, increased fascial restrictions, impaired perceived functional ability, increased muscle spasms, impaired flexibility, improper body mechanics, postural dysfunction, and pain.   ACTIVITY LIMITATIONS: carrying, lifting, bending, squatting, transfers, locomotion level, and caring for others  PARTICIPATION LIMITATIONS: community activity and occupation  PERSONAL FACTORS: Age, Fitness, Time since onset of injury/illness/exacerbation, and 3+ comorbidities: Osteopenia, DM, HTN, cervical lymphadenopathy, cervical strain, GERD, h/o gastric ulcer, HLD, migraine, urinary incontinence, abdominal hysterectomy, lap cholecystectomy, bladder surgery  are also affecting patient's functional outcome.   REHAB POTENTIAL: Excellent  CLINICAL DECISION MAKING: Stable/uncomplicated  EVALUATION COMPLEXITY: Low   GOALS: Goals reviewed with patient? Yes  SHORT TERM GOALS: Target date: 10/06/2021   Patient will be independent with initial HEP. Baseline: Initial HEP provided on eval Goal status: MET  09/29/21  LONG TERM GOALS: Target date: 10/20/2021   Patient will be independent with advanced/ongoing HEP to improve outcomes and carryover.  Baseline: Initial HEP provided on eval Goal status: MET  09/29/21    2.  Patient will report at least 75% improvement in L knee pain to improve QOL. Baseline: 1/10 on eval but increases with patients transfers while at work Goal status: MET  09/29/21 - Pt reports pain fully resolved  3.  Patient will demonstrate improved B LE strength to >/= 4+/5 for improved stability and ease of mobility. Baseline: Refer to above MMT chart Goal status: MET  09/29/21  4. Patient will be  able to ascend/descend stairs with 1 HR and reciprocal step pattern safely to access home and community.  Baseline:  Goal status: MET  09/29/21  5.  Patient will report ability to resume walking for exercise w/o limitation due to L knee pain or weakness Baseline:  Goal status: MET  09/29/21 - Pt reports no concerns with walking at this point  6.  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   PLAN: PT FREQUENCY: 2x/week  PT DURATION: 4 weeks  PLANNED INTERVENTIONS: Therapeutic exercises, Therapeutic activity, Neuromuscular re-education, Balance training, Gait training, Patient/Family education, Self Care, Joint mobilization, Stair training, Dry Needling, Electrical stimulation, Cryotherapy, Moist heat, Taping, Vasopneumatic device, Ultrasound, Ionotophoresis 67m/ml Dexamethasone, Manual therapy, and Re-evaluation  PLAN FOR NEXT SESSION: transition to HEP + 30-day hold   JPercival Spanish PT 09/29/2021, 12:26 PM   PHYSICAL THERAPY DISCHARGE SUMMARY  Visits from Start of Care: 3  Current functional level related to goals / functional outcomes:   Refer to above clinical impression and goal assessment for status as of last visit on 09/29/2021. Patient was placed on hold for 30 days and has not needed to return to PT, therefore will proceed with discharge from PT for this episode.     Remaining deficits:   As above.    Education / Equipment:   HEP   Patient agrees to discharge. Patient goals were met. Patient is being discharged due to meeting the stated rehab goals.  JPercival Spanish PT, MPT  11/07/21, 10:04 AM  [facility] [address]  [place], [place], [address] Phone: [phone]   Fax:  [phone]

## 2021-10-06 ENCOUNTER — Encounter: Payer: 59 | Admitting: Physical Therapy

## 2021-10-09 ENCOUNTER — Encounter: Payer: 59 | Admitting: Physical Therapy

## 2021-10-13 ENCOUNTER — Encounter: Payer: 59 | Admitting: Physical Therapy

## 2021-10-17 ENCOUNTER — Other Ambulatory Visit: Payer: Self-pay | Admitting: Osteopathic Medicine

## 2021-10-17 DIAGNOSIS — I10 Essential (primary) hypertension: Secondary | ICD-10-CM

## 2021-10-19 ENCOUNTER — Ambulatory Visit (INDEPENDENT_AMBULATORY_CARE_PROVIDER_SITE_OTHER): Payer: 59

## 2021-10-19 ENCOUNTER — Ambulatory Visit: Payer: PRIVATE HEALTH INSURANCE

## 2021-10-19 DIAGNOSIS — Z1231 Encounter for screening mammogram for malignant neoplasm of breast: Secondary | ICD-10-CM

## 2021-10-20 ENCOUNTER — Encounter: Payer: 59 | Admitting: Physical Therapy

## 2021-10-24 ENCOUNTER — Other Ambulatory Visit (HOSPITAL_BASED_OUTPATIENT_CLINIC_OR_DEPARTMENT_OTHER): Payer: Self-pay

## 2021-10-25 ENCOUNTER — Other Ambulatory Visit: Payer: Self-pay | Admitting: Medical-Surgical

## 2021-10-25 ENCOUNTER — Other Ambulatory Visit (HOSPITAL_BASED_OUTPATIENT_CLINIC_OR_DEPARTMENT_OTHER): Payer: Self-pay

## 2021-10-25 DIAGNOSIS — I10 Essential (primary) hypertension: Secondary | ICD-10-CM

## 2021-10-25 MED ORDER — LOSARTAN POTASSIUM 25 MG PO TABS
25.0000 mg | ORAL_TABLET | Freq: Every day | ORAL | 0 refills | Status: DC
  Filled 2021-10-25: qty 90, 90d supply, fill #0

## 2021-10-30 ENCOUNTER — Telehealth: Payer: Self-pay | Admitting: Medical-Surgical

## 2021-10-30 NOTE — Telephone Encounter (Signed)
Pt called. She wants to switch from [facility] to Dr. Mel Almond.  She stated that a friend of hers told her about Dr Ferol Luz and this is why she wants to switch.

## 2021-10-30 NOTE — Telephone Encounter (Signed)
That’s fine with me

## 2021-11-08 ENCOUNTER — Other Ambulatory Visit (HOSPITAL_BASED_OUTPATIENT_CLINIC_OR_DEPARTMENT_OTHER): Payer: Self-pay

## 2021-11-08 MED ORDER — INFLUENZA VAC SPLIT QUAD 0.5 ML IM SUSY
PREFILLED_SYRINGE | INTRAMUSCULAR | 0 refills | Status: DC
  Filled 2021-11-08: qty 0.5, 1d supply, fill #0

## 2021-11-17 ENCOUNTER — Other Ambulatory Visit: Payer: Self-pay | Admitting: Osteopathic Medicine

## 2021-11-17 ENCOUNTER — Other Ambulatory Visit (HOSPITAL_BASED_OUTPATIENT_CLINIC_OR_DEPARTMENT_OTHER): Payer: Self-pay

## 2021-11-17 ENCOUNTER — Other Ambulatory Visit: Payer: Self-pay | Admitting: Cardiology

## 2021-11-17 DIAGNOSIS — E7800 Pure hypercholesterolemia, unspecified: Secondary | ICD-10-CM

## 2021-11-17 MED ORDER — ATORVASTATIN CALCIUM 40 MG PO TABS
40.0000 mg | ORAL_TABLET | Freq: Every day | ORAL | 3 refills | Status: DC
  Filled 2021-11-17: qty 90, 90d supply, fill #0

## 2021-11-23 ENCOUNTER — Ambulatory Visit: Payer: 59 | Admitting: Medical-Surgical

## 2021-11-30 ENCOUNTER — Other Ambulatory Visit (HOSPITAL_BASED_OUTPATIENT_CLINIC_OR_DEPARTMENT_OTHER): Payer: Self-pay

## 2021-11-30 ENCOUNTER — Ambulatory Visit (INDEPENDENT_AMBULATORY_CARE_PROVIDER_SITE_OTHER): Payer: 59 | Admitting: Family Medicine

## 2021-11-30 ENCOUNTER — Encounter: Payer: Self-pay | Admitting: Family Medicine

## 2021-11-30 VITALS — BP 140/82 | HR 52 | Ht 60.0 in | Wt 157.0 lb

## 2021-11-30 DIAGNOSIS — E7800 Pure hypercholesterolemia, unspecified: Secondary | ICD-10-CM

## 2021-11-30 DIAGNOSIS — Z79899 Other long term (current) drug therapy: Secondary | ICD-10-CM

## 2021-11-30 DIAGNOSIS — I10 Essential (primary) hypertension: Secondary | ICD-10-CM | POA: Diagnosis not present

## 2021-11-30 DIAGNOSIS — R7303 Prediabetes: Secondary | ICD-10-CM

## 2021-11-30 DIAGNOSIS — K219 Gastro-esophageal reflux disease without esophagitis: Secondary | ICD-10-CM | POA: Diagnosis not present

## 2021-11-30 MED ORDER — OMEPRAZOLE 40 MG PO CPDR
DELAYED_RELEASE_CAPSULE | Freq: Every day | ORAL | 1 refills | Status: DC
  Filled 2021-11-30: qty 90, fill #0

## 2021-11-30 MED ORDER — ATORVASTATIN CALCIUM 40 MG PO TABS
40.0000 mg | ORAL_TABLET | Freq: Every day | ORAL | 3 refills | Status: DC
  Filled 2021-11-30: qty 90, 90d supply, fill #0

## 2021-11-30 MED ORDER — AMLODIPINE BESYLATE 5 MG PO TABS
5.0000 mg | ORAL_TABLET | Freq: Every day | ORAL | 2 refills | Status: DC
  Filled 2021-11-30: qty 30, 30d supply, fill #0

## 2021-11-30 MED ORDER — AMLODIPINE BESYLATE 10 MG PO TABS
10.0000 mg | ORAL_TABLET | Freq: Every day | ORAL | 3 refills | Status: DC
  Filled 2021-11-30: qty 30, 30d supply, fill #0

## 2021-11-30 NOTE — Progress Notes (Signed)
Established patient visit   Patient: Whitney Neal   DOB: 28-Mar-1960   61 y.o. Female  MRN: [id number] Visit Date: 11/30/2021  Today's healthcare provider: Owens Loffler, DO   Chief Complaint  Patient presents with   Hercules    Chief Complaint  Patient presents with   Establish Care   HPI  Pt presents to transfer care and discuss pmh.   Prediabetes  Last A1C in April 2023 was 5.9 Metformin 524m daily On atorvastatin 49m  HTN  Losartan 2569mhe feels like she has started to develop a cough from this medication and she would like to change medications today.   GERD  Omeprazole 4m29mas been doing well on this medication.   Review of Systems  Constitutional:  Negative for activity change, fatigue and fever.  Respiratory:  Negative for cough and shortness of breath.   Cardiovascular:  Negative for chest pain.  Gastrointestinal:  Negative for abdominal pain.  Genitourinary:  Negative for difficulty urinating.       Current Meds  Medication Sig   amLODipine (NORVASC) 5 MG tablet Take 1 tablet (5 mg total) by mouth daily.   losartan (COZAAR) 25 MG tablet Take 1 tablet (25 mg total) by mouth daily.   metFORMIN (GLUCOPHAGE-XR) 500 MG 24 hr tablet TAKE 1 TABLET (500 MG TOTAL) BY MOUTH DAILY WITH BREAKFAST.   Multiple Vitamins-Minerals (MULTIVITAMIN PO) Take 1 tablet by mouth daily.   [DISCONTINUED] amLODipine (NORVASC) 10 MG tablet Take 1 tablet (10 mg total) by mouth daily.   [DISCONTINUED] metoprolol tartrate (LOPRESSOR) 100 MG tablet Take 1 tablet (100 mg total) by mouth 2 hours prior to CT scan.   [DISCONTINUED] omeprazole (PRILOSEC) 40 MG capsule TAKE 1 CAPSULE (40 MG TOTAL) BY MOUTH DAILY.   Current Facility-Administered Medications for the 11/30/21 encounter (Office Visit) with WachOwens Loffler  Medication   0.9 %  sodium chloride infusion    OBJECTIVE    BP (!) 140/82   Pulse (!) 52   Ht 5' (1.524 m)   Wt 157 lb (71.2  kg)   SpO2 100%   BMI 30.66 kg/m   Physical Exam Vitals and nursing note reviewed.  Constitutional:      General: She is not in acute distress.    Appearance: Normal appearance.  HENT:     Head: Normocephalic and atraumatic.     Right Ear: External ear normal.     Left Ear: External ear normal.     Nose: Nose normal.  Eyes:     Conjunctiva/sclera: Conjunctivae normal.  Cardiovascular:     Rate and Rhythm: Normal rate and regular rhythm.  Pulmonary:     Effort: Pulmonary effort is normal.     Breath sounds: Normal breath sounds.  Neurological:     General: No focal deficit present.     Mental Status: She is alert and oriented to person, place, and time.  Psychiatric:        Mood and Affect: Mood normal.        Behavior: Behavior normal.        Thought Content: Thought content normal.        Judgment: Judgment normal.        ASSESSMENT & PLAN    Problem List Items Addressed This Visit       Cardiovascular and Mediastinum   Essential hypertension - Primary    - pt feels like the losartan was  giving her a cough. We have switched to amlodipine and are starting at 77m dosage to see if this works better for her and resolves her cough. Surprised losartan gave her a cough since we usually see this with lisinopril but nonetheless we will change and not efficacy.  - follow up in one month for HTN check  - have ordered CMP to check electrolytes      Relevant Medications   atorvastatin (LIPITOR) 40 MG tablet   amLODipine (NORVASC) 5 MG tablet   Other Relevant Orders   COMPLETE METABOLIC PANEL WITH GFR     Digestive   GERD   Relevant Medications   omeprazole (PRILOSEC) 40 MG capsule     Other   Prediabetes    - repeated A1C today to see if we can do a trial off metformin 5033mdaily. For her age goal a1c is 7-8 to prevent risk of hypoglycemia.  - if A1C <7 will consider trial off metformin for 3 months and focus on diet control.      Relevant Orders   HgB A1c    Other Visit Diagnoses     High cholesterol       Relevant Medications   atorvastatin (LIPITOR) 40 MG tablet   amLODipine (NORVASC) 5 MG tablet   Medication management       Relevant Orders   Magnesium       Return in about 4 weeks (around 12/28/2021) for HTN follow up.      Meds ordered this encounter  Medications   DISCONTD: amLODipine (NORVASC) 10 MG tablet    Sig: Take 1 tablet (10 mg total) by mouth daily.    Dispense:  30 tablet    Refill:  3   atorvastatin (LIPITOR) 40 MG tablet    Sig: Take 1 tablet (40 mg total) by mouth daily.    Dispense:  90 tablet    Refill:  3   omeprazole (PRILOSEC) 40 MG capsule    Sig: TAKE 1 CAPSULE (40 MG TOTAL) BY MOUTH DAILY.    Dispense:  90 capsule    Refill:  1   amLODipine (NORVASC) 5 MG tablet    Sig: Take 1 tablet (5 mg total) by mouth daily.    Dispense:  30 tablet    Refill:  2    Orders Placed This Encounter  Procedures   HgB A1c   COMPLETE METABOLIC PANEL WITH GFR   Magnesium     ErOwens LofflerDO  [phone])  [place]

## 2021-11-30 NOTE — Assessment & Plan Note (Signed)
-   repeated A1C today to see if we can do a trial off metformin 553m daily. For her age goal a1c is 7-8 to prevent risk of hypoglycemia.  - if A1C <7 will consider trial off metformin for 3 months and focus on diet control.

## 2021-11-30 NOTE — Assessment & Plan Note (Addendum)
-   pt feels like the losartan was giving her a cough. We have switched to amlodipine and are starting at 35m dosage to see if this works better for her and resolves her cough. Surprised losartan gave her a cough since we usually see this with lisinopril but nonetheless we will change and not efficacy.  - follow up in one month for HTN check  - have ordered CMP to check electrolytes

## 2021-12-01 ENCOUNTER — Other Ambulatory Visit: Payer: Self-pay | Admitting: Family Medicine

## 2021-12-01 ENCOUNTER — Other Ambulatory Visit (HOSPITAL_BASED_OUTPATIENT_CLINIC_OR_DEPARTMENT_OTHER): Payer: Self-pay

## 2021-12-01 DIAGNOSIS — R7303 Prediabetes: Secondary | ICD-10-CM

## 2021-12-01 LAB — COMPLETE METABOLIC PANEL WITH GFR
AG Ratio: 1.7 (calc) (ref 1.0–2.5)
ALT: 14 U/L (ref 6–29)
AST: 15 U/L (ref 10–35)
Albumin: 4.2 g/dL (ref 3.6–5.1)
Alkaline phosphatase (APISO): 87 U/L (ref 37–153)
BUN: 14 mg/dL (ref 7–25)
CO2: 27 mmol/L (ref 20–32)
Calcium: 9.4 mg/dL (ref 8.6–10.4)
Chloride: 108 mmol/L (ref 98–110)
Creat: 0.58 mg/dL (ref 0.50–1.05)
Globulin: 2.5 g/dL (calc) (ref 1.9–3.7)
Glucose, Bld: 103 mg/dL — ABNORMAL HIGH (ref 65–99)
Potassium: 4.3 mmol/L (ref 3.5–5.3)
Sodium: 144 mmol/L (ref 135–146)
Total Bilirubin: 0.5 mg/dL (ref 0.2–1.2)
Total Protein: 6.7 g/dL (ref 6.1–8.1)
eGFR: 102 mL/min/{1.73_m2} (ref >=60)

## 2021-12-01 LAB — MAGNESIUM: Magnesium: 2 mg/dL (ref 1.5–2.5)

## 2021-12-01 LAB — HEMOGLOBIN A1C
Hgb A1c MFr Bld: 6.3 % of total Hgb — ABNORMAL HIGH (ref <5.7)
Mean Plasma Glucose: 134 mg/dL
eAG (mmol/L): 7.4 mmol/L

## 2021-12-01 MED ORDER — METFORMIN HCL ER 500 MG PO TB24
500.0000 mg | ORAL_TABLET | Freq: Every day | ORAL | 2 refills | Status: DC
Start: 2021-12-01 — End: 2021-12-28
  Filled 2021-12-01: qty 90, 90d supply, fill #0

## 2021-12-11 ENCOUNTER — Ambulatory Visit: Payer: 59 | Admitting: Medical-Surgical

## 2021-12-28 ENCOUNTER — Other Ambulatory Visit (HOSPITAL_BASED_OUTPATIENT_CLINIC_OR_DEPARTMENT_OTHER): Payer: Self-pay

## 2021-12-28 ENCOUNTER — Ambulatory Visit (INDEPENDENT_AMBULATORY_CARE_PROVIDER_SITE_OTHER): Payer: 59 | Admitting: Family Medicine

## 2021-12-28 ENCOUNTER — Encounter: Payer: Self-pay | Admitting: Family Medicine

## 2021-12-28 DIAGNOSIS — E7800 Pure hypercholesterolemia, unspecified: Secondary | ICD-10-CM | POA: Diagnosis not present

## 2021-12-28 DIAGNOSIS — R7303 Prediabetes: Secondary | ICD-10-CM

## 2021-12-28 DIAGNOSIS — K219 Gastro-esophageal reflux disease without esophagitis: Secondary | ICD-10-CM | POA: Diagnosis not present

## 2021-12-28 DIAGNOSIS — I10 Essential (primary) hypertension: Secondary | ICD-10-CM

## 2021-12-28 MED ORDER — ATORVASTATIN CALCIUM 40 MG PO TABS
40.0000 mg | ORAL_TABLET | Freq: Every day | ORAL | 3 refills | Status: DC
  Filled 2021-12-28 – 2022-02-23 (×3): qty 90, 90d supply, fill #0
  Filled 2022-06-13: qty 90, 90d supply, fill #1

## 2021-12-28 MED ORDER — AMLODIPINE BESYLATE 5 MG PO TABS
5.0000 mg | ORAL_TABLET | Freq: Every day | ORAL | 2 refills | Status: DC
  Filled 2021-12-28: qty 90, 90d supply, fill #0
  Filled 2022-02-22 – 2022-03-23 (×3): qty 90, 90d supply, fill #1
  Filled 2022-06-13: qty 90, 90d supply, fill #2

## 2021-12-28 MED ORDER — OMEPRAZOLE 40 MG PO CPDR
DELAYED_RELEASE_CAPSULE | Freq: Every day | ORAL | 1 refills | Status: DC
  Filled 2021-12-28: qty 90, fill #0
  Filled 2022-06-13: qty 90, 90d supply, fill #0
  Filled 2022-09-07 (×3): qty 90, 90d supply, fill #1

## 2021-12-28 MED ORDER — METFORMIN HCL ER 500 MG PO TB24
500.0000 mg | ORAL_TABLET | Freq: Every day | ORAL | 3 refills | Status: DC
  Filled 2021-12-28: qty 90, 90d supply, fill #0
  Filled 2022-02-22 – 2022-03-23 (×3): qty 90, 90d supply, fill #1
  Filled 2022-06-13 – 2022-06-22 (×2): qty 90, 90d supply, fill #2

## 2021-12-28 NOTE — Progress Notes (Signed)
Established patient visit   Patient: Whitney Neal   DOB: 04-06-1960   61 y.o. Female  MRN: [id number] Visit Date: 12/28/2021  Today's healthcare provider: Owens Loffler, DO   Chief Complaint  Patient presents with   Hypertension    SUBJECTIVE    Chief Complaint  Patient presents with   Hypertension   Hypertension Pertinent negatives include no chest pain or shortness of breath.    Pt presents to follow up on HTN today. At her last visit, her losartan was switched to amlodipine because of a cough. She no longer reports a cough. BP today is 139/67. Labs are up to date.   Review of Systems  Constitutional:  Negative for activity change, fatigue and fever.  Respiratory:  Negative for cough and shortness of breath.   Cardiovascular:  Negative for chest pain.  Gastrointestinal:  Negative for abdominal pain.  Genitourinary:  Negative for difficulty urinating.       Current Meds  Medication Sig   Multiple Vitamins-Minerals (MULTIVITAMIN PO) Take 1 tablet by mouth daily.   [DISCONTINUED] amLODipine (NORVASC) 5 MG tablet Take 1 tablet (5 mg total) by mouth daily.   [DISCONTINUED] atorvastatin (LIPITOR) 40 MG tablet Take 1 tablet (40 mg total) by mouth daily.   [DISCONTINUED] losartan (COZAAR) 25 MG tablet Take 1 tablet (25 mg total) by mouth daily.   [DISCONTINUED] metFORMIN (GLUCOPHAGE-XR) 500 MG 24 hr tablet Take 1 tablet (500 mg total) by mouth daily with breakfast.   [DISCONTINUED] omeprazole (PRILOSEC) 40 MG capsule TAKE 1 CAPSULE (40 MG TOTAL) BY MOUTH DAILY.   Current Facility-Administered Medications for the 12/28/21 encounter (Office Visit) with Owens Loffler, DO  Medication   0.9 %  sodium chloride infusion    OBJECTIVE    BP 139/67   Pulse 85   Ht 5' (1.524 m)   Wt 156 lb (70.8 kg)   SpO2 100%   BMI 30.47 kg/m   Physical Exam Vitals and nursing note reviewed.  Constitutional:      General: She is not in acute distress.    Appearance: Normal  appearance.  HENT:     Head: Normocephalic and atraumatic.     Right Ear: External ear normal.     Left Ear: External ear normal.     Nose: Nose normal.  Eyes:     Conjunctiva/sclera: Conjunctivae normal.  Cardiovascular:     Rate and Rhythm: Normal rate and regular rhythm.  Pulmonary:     Effort: Pulmonary effort is normal.     Breath sounds: Normal breath sounds.  Neurological:     General: No focal deficit present.     Mental Status: She is alert and oriented to person, place, and time.  Psychiatric:        Mood and Affect: Mood normal.        Behavior: Behavior normal.        Thought Content: Thought content normal.        Judgment: Judgment normal.        ASSESSMENT & PLAN    Problem List Items Addressed This Visit       Cardiovascular and Mediastinum   Essential hypertension    - BP well controlled today on amlodipine 73m, will continue and give refills  - can follow up in 6 months, labs up to date        Relevant Medications   atorvastatin (LIPITOR) 40 MG tablet   amLODipine (NORVASC) 5 MG tablet  Digestive   GERD    - refill omeprazole  - pt doing well on this.       Relevant Medications   omeprazole (PRILOSEC) 40 MG capsule     Other   Prediabetes    - doing well A1c up to date  - continue metformin. At next visit, we can do a trial off metformin if patient would like      Relevant Medications   metFORMIN (GLUCOPHAGE-XR) 500 MG 24 hr tablet   Other Visit Diagnoses     High cholesterol       Relevant Medications   atorvastatin (LIPITOR) 40 MG tablet   amLODipine (NORVASC) 5 MG tablet       Return in about 6 months (around 06/28/2022).      Meds ordered this encounter  Medications   atorvastatin (LIPITOR) 40 MG tablet    Sig: Take 1 tablet (40 mg total) by mouth daily.    Dispense:  90 tablet    Refill:  3   amLODipine (NORVASC) 5 MG tablet    Sig: Take 1 tablet (5 mg total) by mouth daily.    Dispense:  90 tablet    Refill:  2    metFORMIN (GLUCOPHAGE-XR) 500 MG 24 hr tablet    Sig: Take 1 tablet (500 mg total) by mouth daily with breakfast.    Dispense:  90 tablet    Refill:  3   omeprazole (PRILOSEC) 40 MG capsule    Sig: TAKE 1 CAPSULE (40 MG TOTAL) BY MOUTH DAILY.    Dispense:  90 capsule    Refill:  1    No orders of the defined types were placed in this encounter.    Owens Loffler, DO  [facility] [phone] ([phone] (fax)  [place]

## 2021-12-28 NOTE — Assessment & Plan Note (Signed)
-   BP well controlled today on amlodipine 70m, will continue and give refills  - can follow up in 6 months, labs up to date

## 2021-12-28 NOTE — Assessment & Plan Note (Signed)
-   refill omeprazole  - pt doing well on this.

## 2021-12-28 NOTE — Assessment & Plan Note (Signed)
-   doing well A1c up to date  - continue metformin. At next visit, we can do a trial off metformin if patient would like

## 2022-02-22 ENCOUNTER — Other Ambulatory Visit (HOSPITAL_BASED_OUTPATIENT_CLINIC_OR_DEPARTMENT_OTHER): Payer: Self-pay

## 2022-02-23 ENCOUNTER — Other Ambulatory Visit (HOSPITAL_BASED_OUTPATIENT_CLINIC_OR_DEPARTMENT_OTHER): Payer: Self-pay

## 2022-06-13 ENCOUNTER — Other Ambulatory Visit (HOSPITAL_BASED_OUTPATIENT_CLINIC_OR_DEPARTMENT_OTHER): Payer: Self-pay

## 2022-06-22 ENCOUNTER — Other Ambulatory Visit (HOSPITAL_BASED_OUTPATIENT_CLINIC_OR_DEPARTMENT_OTHER): Payer: Self-pay

## 2022-06-25 ENCOUNTER — Other Ambulatory Visit (HOSPITAL_BASED_OUTPATIENT_CLINIC_OR_DEPARTMENT_OTHER): Payer: Self-pay

## 2022-06-28 ENCOUNTER — Ambulatory Visit: Payer: PRIVATE HEALTH INSURANCE | Admitting: Family Medicine

## 2022-07-05 ENCOUNTER — Other Ambulatory Visit (HOSPITAL_BASED_OUTPATIENT_CLINIC_OR_DEPARTMENT_OTHER): Payer: Self-pay

## 2022-07-05 ENCOUNTER — Ambulatory Visit (INDEPENDENT_AMBULATORY_CARE_PROVIDER_SITE_OTHER): Payer: Commercial Managed Care - PPO | Admitting: Family Medicine

## 2022-07-05 ENCOUNTER — Encounter: Payer: Self-pay | Admitting: Family Medicine

## 2022-07-05 VITALS — BP 136/76 | HR 73 | Ht 60.0 in | Wt 152.0 lb

## 2022-07-05 DIAGNOSIS — E7800 Pure hypercholesterolemia, unspecified: Secondary | ICD-10-CM

## 2022-07-05 DIAGNOSIS — I10 Essential (primary) hypertension: Secondary | ICD-10-CM | POA: Diagnosis not present

## 2022-07-05 DIAGNOSIS — Z1159 Encounter for screening for other viral diseases: Secondary | ICD-10-CM | POA: Diagnosis not present

## 2022-07-05 DIAGNOSIS — Z79899 Other long term (current) drug therapy: Secondary | ICD-10-CM | POA: Insufficient documentation

## 2022-07-05 DIAGNOSIS — R7303 Prediabetes: Secondary | ICD-10-CM | POA: Diagnosis not present

## 2022-07-05 LAB — POCT GLYCOSYLATED HEMOGLOBIN (HGB A1C): Hemoglobin A1C: 5.7 % — AB (ref 4.0–5.6)

## 2022-07-05 LAB — POCT UA - MICROALBUMIN
Albumin/Creatinine Ratio, Urine, POC: <30
Creatinine, POC: 100 mg/dL
Microalbumin Ur, POC: 30 mg/L

## 2022-07-05 MED ORDER — METFORMIN HCL ER 500 MG PO TB24
500.0000 mg | ORAL_TABLET | Freq: Every day | ORAL | 3 refills | Status: DC
Start: 2022-07-05 — End: 2023-01-10
  Filled 2022-07-05 – 2022-10-18 (×2): qty 90, 90d supply, fill #0
  Filled 2022-12-12: qty 90, 90d supply, fill #1

## 2022-07-05 MED ORDER — ATORVASTATIN CALCIUM 40 MG PO TABS
40.0000 mg | ORAL_TABLET | Freq: Every day | ORAL | 3 refills | Status: DC
Start: 2022-07-05 — End: 2023-01-10
  Filled 2022-07-05 – 2022-09-07 (×3): qty 90, 90d supply, fill #0
  Filled 2022-12-12: qty 90, 90d supply, fill #1

## 2022-07-05 MED ORDER — AMLODIPINE BESYLATE 5 MG PO TABS
5.0000 mg | ORAL_TABLET | Freq: Every day | ORAL | 2 refills | Status: DC
  Filled 2022-07-05 – 2022-09-20 (×2): qty 90, 90d supply, fill #0
  Filled 2022-12-12: qty 90, 90d supply, fill #1

## 2022-07-05 NOTE — Assessment & Plan Note (Signed)
point of care A1c is 5.7 patient has been doing well.  I did offer her a trial off her metformin however she says she would like to continue on this.  Have sent in refills.

## 2022-07-05 NOTE — Assessment & Plan Note (Signed)
Patient is on omeprazole.  We will go ahead and follow with a magnesium and vitamin D level.

## 2022-07-05 NOTE — Assessment & Plan Note (Signed)
-   Patient's blood pressure is well-controlled today.  We will continue her on amlodipine 5 - Have ordered blood work

## 2022-07-05 NOTE — Progress Notes (Signed)
Established patient visit   Patient: Whitney Neal   DOB: 03-May-1960   62 y.o. Female  MRN: [id number] Visit Date: 07/05/2022  Today's healthcare provider: Charlton Amor, DO   Chief Complaint  Patient presents with   Diabetes   Hypertension    SUBJECTIVE    Chief Complaint  Patient presents with   Diabetes   Hypertension   HPI  Pt presents for HTN and prediabetes follow up. She is on amlodipine 5mg . BP well controlled today at 136/76. She is doing well.    Review of Systems  Constitutional:  Negative for activity change, fatigue and fever.  Respiratory:  Negative for cough and shortness of breath.   Cardiovascular:  Negative for chest pain.  Gastrointestinal:  Negative for abdominal pain.  Genitourinary:  Negative for difficulty urinating.       Current Meds  Medication Sig   Multiple Vitamins-Minerals (MULTIVITAMIN PO) Take 1 tablet by mouth daily.   omeprazole (PRILOSEC) 40 MG capsule TAKE 1 CAPSULE (40 MG TOTAL) BY MOUTH DAILY.   [DISCONTINUED] amLODipine (NORVASC) 5 MG tablet Take 1 tablet (5 mg total) by mouth daily.   [DISCONTINUED] atorvastatin (LIPITOR) 40 MG tablet Take 1 tablet (40 mg total) by mouth daily.   [DISCONTINUED] metFORMIN (GLUCOPHAGE-XR) 500 MG 24 hr tablet Take 1 tablet (500 mg total) by mouth daily with breakfast.    OBJECTIVE    BP 136/76   Pulse 73   Ht 5' (1.524 m)   Wt 152 lb (68.9 kg)   SpO2 98%   BMI 29.69 kg/m   Physical Exam Vitals and nursing note reviewed.  Constitutional:      General: She is not in acute distress.    Appearance: Normal appearance.  HENT:     Head: Normocephalic and atraumatic.     Right Ear: External ear normal.     Left Ear: External ear normal.     Nose: Nose normal.  Eyes:     Conjunctiva/sclera: Conjunctivae normal.  Cardiovascular:     Rate and Rhythm: Normal rate and regular rhythm.  Pulmonary:     Effort: Pulmonary effort is normal.     Breath sounds: Normal breath sounds.   Neurological:     General: No focal deficit present.     Mental Status: She is alert and oriented to person, place, and time.  Psychiatric:        Mood and Affect: Mood normal.        Behavior: Behavior normal.        Thought Content: Thought content normal.        Judgment: Judgment normal.          ASSESSMENT & PLAN    Problem List Items Addressed This Visit       Cardiovascular and Mediastinum   Essential hypertension - Primary    - Patient's blood pressure is well-controlled today.  We will continue her on amlodipine 5 - Have ordered blood work      Relevant Medications   amLODipine (NORVASC) 5 MG tablet   atorvastatin (LIPITOR) 40 MG tablet   Other Relevant Orders   POCT glycosylated hemoglobin (Hb A1C)   POCT UA - Microalbumin   COMPLETE METABOLIC PANEL WITH GFR   CBC     Other   Prediabetes    point of care A1c is 5.7 patient has been doing well.  I did offer her a trial off her metformin however she says she would like to  continue on this.  Have sent in refills.      Relevant Medications   atorvastatin (LIPITOR) 40 MG tablet   metFORMIN (GLUCOPHAGE-XR) 500 MG 24 hr tablet   Medication management    Patient is on omeprazole.  We will go ahead and follow with a magnesium and vitamin D level.      Relevant Orders   Vitamin D (25 hydroxy)   Magnesium   Other Visit Diagnoses     Encounter for hepatitis C screening test for low risk patient       Relevant Orders   Hepatitis C Antibody   High cholesterol       Relevant Medications   amLODipine (NORVASC) 5 MG tablet   atorvastatin (LIPITOR) 40 MG tablet       Return in about 6 months (around 01/05/2023).      Meds ordered this encounter  Medications   amLODipine (NORVASC) 5 MG tablet    Sig: Take 1 tablet (5 mg total) by mouth daily.    Dispense:  90 tablet    Refill:  2   atorvastatin (LIPITOR) 40 MG tablet    Sig: Take 1 tablet (40 mg total) by mouth daily.    Dispense:  90 tablet     Refill:  3   metFORMIN (GLUCOPHAGE-XR) 500 MG 24 hr tablet    Sig: Take 1 tablet (500 mg total) by mouth daily with breakfast.    Dispense:  90 tablet    Refill:  3    Orders Placed This Encounter  Procedures   COMPLETE METABOLIC PANEL WITH GFR   CBC   Hepatitis C Antibody   Vitamin D (25 hydroxy)   Magnesium   POCT glycosylated hemoglobin (Hb A1C)   POCT UA - Microalbumin     Charlton Amor, DO  [facility] Primary [facility] & Sports Medicine at [facility] [phone] (phone) [phone] (fax)  [facility]

## 2022-07-06 ENCOUNTER — Other Ambulatory Visit: Payer: Self-pay | Admitting: Family Medicine

## 2022-07-06 ENCOUNTER — Other Ambulatory Visit (HOSPITAL_BASED_OUTPATIENT_CLINIC_OR_DEPARTMENT_OTHER): Payer: Self-pay

## 2022-07-06 LAB — COMPLETE METABOLIC PANEL WITH GFR
AG Ratio: 1.6 (calc) (ref 1.0–2.5)
ALT: 18 U/L (ref 6–29)
AST: 19 U/L (ref 10–35)
Albumin: 4.4 g/dL (ref 3.6–5.1)
Alkaline phosphatase (APISO): 101 U/L (ref 37–153)
BUN: 13 mg/dL (ref 7–25)
CO2: 28 mmol/L (ref 20–32)
Calcium: 9.7 mg/dL (ref 8.6–10.4)
Chloride: 106 mmol/L (ref 98–110)
Creat: 0.61 mg/dL (ref 0.50–1.05)
Globulin: 2.7 g/dL (calc) (ref 1.9–3.7)
Glucose, Bld: 100 mg/dL — ABNORMAL HIGH (ref 65–99)
Potassium: 4.3 mmol/L (ref 3.5–5.3)
Sodium: 143 mmol/L (ref 135–146)
Total Bilirubin: 0.6 mg/dL (ref 0.2–1.2)
Total Protein: 7.1 g/dL (ref 6.1–8.1)
eGFR: 100 mL/min/{1.73_m2} (ref >=60)

## 2022-07-06 LAB — CBC
HCT: 40.8 % (ref 35.0–45.0)
Hemoglobin: 13.5 g/dL (ref 11.7–15.5)
MCH: 28.4 pg (ref 27.0–33.0)
MCHC: 33.1 g/dL (ref 32.0–36.0)
MCV: 85.9 fL (ref 80.0–100.0)
MPV: 11.4 fL (ref 7.5–12.5)
Platelets: 329 10*3/uL (ref 140–400)
RBC: 4.75 10*6/uL (ref 3.80–5.10)
RDW: 13 % (ref 11.0–15.0)
WBC: 3.9 10*3/uL (ref 3.8–10.8)

## 2022-07-06 LAB — MAGNESIUM: Magnesium: 2.1 mg/dL (ref 1.5–2.5)

## 2022-07-06 LAB — VITAMIN D 25 HYDROXY (VIT D DEFICIENCY, FRACTURES): Vit D, 25-Hydroxy: 26 ng/mL — ABNORMAL LOW (ref 30–100)

## 2022-07-06 LAB — HEPATITIS C ANTIBODY: Hepatitis C Ab: NONREACTIVE

## 2022-07-06 MED ORDER — CHOLECALCIFEROL 10 MCG (400 UNIT) PO TABS
400.0000 [IU] | ORAL_TABLET | Freq: Every day | ORAL | 0 refills | Status: DC
  Filled 2022-07-06 (×2): qty 75, 75d supply, fill #0
  Filled 2022-09-07: qty 100, 100d supply, fill #0

## 2022-07-09 ENCOUNTER — Other Ambulatory Visit (HOSPITAL_BASED_OUTPATIENT_CLINIC_OR_DEPARTMENT_OTHER): Payer: Self-pay

## 2022-09-07 ENCOUNTER — Other Ambulatory Visit (HOSPITAL_BASED_OUTPATIENT_CLINIC_OR_DEPARTMENT_OTHER): Payer: Self-pay

## 2022-09-11 ENCOUNTER — Other Ambulatory Visit (HOSPITAL_BASED_OUTPATIENT_CLINIC_OR_DEPARTMENT_OTHER): Payer: Self-pay

## 2022-09-21 ENCOUNTER — Other Ambulatory Visit (HOSPITAL_BASED_OUTPATIENT_CLINIC_OR_DEPARTMENT_OTHER): Payer: Self-pay

## 2022-10-11 NOTE — Progress Notes (Signed)
HPI: FU hypertension. 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  Coronary CTA May 2023 showed calcium score 0 and no coronary disease.  Since last seen, the patient denies any dyspnea on exertion, orthopnea, PND, pedal edema, palpitations, syncope or chest pain.   Current Outpatient Medications  Medication Sig Dispense Refill   amLODipine (NORVASC) 5 MG tablet Take 1 tablet (5 mg total) by mouth daily. 90 tablet 2   atorvastatin (LIPITOR) 40 MG tablet Take 1 tablet (40 mg total) by mouth daily. 90 tablet 3   cholecalciferol (VITAMIN D3) 10 MCG (400 UNIT) TABS tablet Take 1 tablet (400 Units total) by mouth daily. 100 tablet 0   metFORMIN (GLUCOPHAGE-XR) 500 MG 24 hr tablet Take 1 tablet (500 mg total) by mouth daily with breakfast. 90 tablet 3   Multiple Vitamins-Minerals (MULTIVITAMIN PO) Take 1 tablet by mouth daily.     omeprazole (PRILOSEC) 40 MG capsule TAKE 1 CAPSULE (40 MG TOTAL) BY MOUTH DAILY. 90 capsule 1   No current facility-administered medications for this visit.     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  Normal EGD 2011, 2014   Cervical lymphadenopathy    left   Cervical strain    Constipation    Delayed bile leak s/p ERCP/stenting 03/14/2017    Diabetes mellitus without complication (HCC)    on meds   GERD (gastroesophageal reflux disease)    on PRN   Hepatic abscesses s/p perc drainage 04/04/2017 04/04/2017   Hypercholesteremia    on meds   Hyperglycemia    a1c 6.3 7/12   Hyperplastic colonic polyp    history of    Hypertension    on meds   Migraine    Osteopenia    PONV (postoperative nausea and vomiting)    Ulcer 2010   Urinary incontinence    Vitamin D deficiency     Past Surgical History:  Procedure Laterality Date    ABDOMINAL HYSTERECTOMY  2009   Dr. Ferdinand Cava - benign disease   BLADDER SURGERY     bladder mesh surgery 2010- Dr. Noland Fordyce   BREAST CYST ASPIRATION     COLONOSCOPY W/ POLYPECTOMY  2011   ENDOSCOPIC RETROGRADE CHOLANGIOPANCREATOGRAPHY (ERCP) WITH PROPOFOL N/A 05/03/2017   Procedure: ENDOSCOPIC RETROGRADE CHOLANGIOPANCREATOGRAPHY (ERCP) WITH PROPOFOL;  Surgeon: Jeani Hawking, MD;  Location: [facility] ENDOSCOPY;  Service: Endoscopy;  Laterality: N/A;   ERCP N/A 03/13/2017   Procedure: ENDOSCOPIC RETROGRADE CHOLANGIOPANCREATOGRAPHY (ERCP);  Surgeon: Jeani Hawking, MD;  Location: [facility] ENDOSCOPY;  Service: Endoscopy;  Laterality: N/A;   ERCP N/A 03/14/2017   Procedure: ENDOSCOPIC RETROGRADE CHOLANGIOPANCREATOGRAPHY (ERCP);  Surgeon: Meryl Dare, MD;  Location: [facility];  Service: Endoscopy;  Laterality: N/A;   IR RADIOLOGIST EVAL & MGMT  04/16/2017   LAPAROSCOPIC CHOLECYSTECTOMY SINGLE PORT N/A 03/09/2017   Procedure: LAPAROSCOPIC CHOLECYSTECTOMY WITH INTRAOPERATIVE CHOLANGIOGRAM, CLOSURE OF ABERRANT DUCT OF LUSCHKA;  Surgeon: Karie Soda, MD;  Location: [facility] ORS;  Service: General;  Laterality: N/A;    Social History   Socioeconomic History   Marital status: Married    Spouse name: Not on file   Number of children: 2   Years of education: Not on file   Highest education level: Not on file  Occupational History  Occupation: Curator: [employer]  Tobacco Use   Smoking status: Never   Smokeless tobacco: Never  Vaping Use   Vaping status: Never Used  Substance and Sexual Activity   Alcohol use: No   Drug use: No   Sexual activity: Not on file  Other Topics Concern   Not on file  Social History Narrative   Reviewed history from 07/04/2009 and no changes required.   Married- is guardian for two grandchildren ages 55 and 34.   Never Smoked   Alcohol use-no   Regular exercise-no   Works in housekeeping at [employer]         Social Determinants of Health   Financial  Resource Strain: Not on file  Food Insecurity: Not on file  Transportation Needs: Not on file  Physical Activity: Not on file  Stress: Not on file  Social Connections: Not on file  Intimate Partner Violence: Not on file    Family History  Problem Relation Age of Onset   Hypertension Father    Heart attack Father 79   Prostate cancer Father    Pulmonary embolism Mother        deceased   Hypertension Mother    Diabetes Sister    Hypertension Brother    Hypertension Sister    Cancer Paternal Grandmother        oral   Thyroid disease Sister        1/2 sister thyroid problem   Healthy Daughter    Heart attack Brother 19   Breast cancer Cousin    Crohn's disease Neg Hx    Colon polyps Neg Hx    Esophageal cancer Neg Hx    Stomach cancer Neg Hx    Rectal cancer Neg Hx     ROS: no fevers or chills, productive cough, hemoptysis, dysphasia, odynophagia, melena, hematochezia, dysuria, hematuria, rash, seizure activity, orthopnea, PND, pedal edema, claudication. Remaining systems are negative.  Physical Exam: Well-developed well-nourished in no acute distress.  Skin is warm and dry.  HEENT is normal.  Neck is supple.  Chest is clear to auscultation with normal expansion.  Cardiovascular exam is regular rate and rhythm.  Abdominal exam nontender or distended. No masses palpated. Extremities show no edema. neuro grossly intact  EKG Interpretation Date/Time:  Wednesday October 17 2022 10:35:02 EDT Ventricular Rate:  87 PR Interval:  162 QRS Duration:  82 QT Interval:  360 QTC Calculation: 433 R Axis:   75  Text Interpretation: Normal sinus rhythm Normal ECG When compared with ECG of 01-Jun-2021 21:28, PREVIOUS ECG IS PRESENT Confirmed by Olga Millers ([id number]) on 10/17/2022 10:36:15 AM     A/P  1 hypertension-patient's blood pressure is controlled.  Continue present medical regimen.  2 hyperlipidemia-continue statin.  Olga Millers, MD

## 2022-10-17 ENCOUNTER — Encounter: Payer: Self-pay | Admitting: Cardiology

## 2022-10-17 ENCOUNTER — Ambulatory Visit: Payer: Commercial Managed Care - PPO | Attending: Cardiology | Admitting: Cardiology

## 2022-10-17 VITALS — BP 135/84 | HR 87 | Ht 60.0 in | Wt 151.0 lb

## 2022-10-17 DIAGNOSIS — E7800 Pure hypercholesterolemia, unspecified: Secondary | ICD-10-CM | POA: Diagnosis not present

## 2022-10-17 DIAGNOSIS — I10 Essential (primary) hypertension: Secondary | ICD-10-CM

## 2022-10-17 DIAGNOSIS — R9431 Abnormal electrocardiogram [ECG] [EKG]: Secondary | ICD-10-CM | POA: Diagnosis not present

## 2022-10-17 NOTE — Patient Instructions (Signed)
Medication Instructions:   Your physician recommends that you continue on your current medications as directed. Please refer to the Current Medication list given to you today.   *If you need a refill on your cardiac medications before your next appointment, please call your pharmacy*   Lab Work:  None ordered.  If you have labs (blood work) drawn today and your tests are completely normal, you will receive your results only by: MyChart Message (if you have MyChart) OR A paper copy in the mail If you have any lab test that is abnormal or we need to change your treatment, we will call you to review the results.   Testing/Procedures:  None ordered.    Follow-Up: At [facility], you and your health needs are our priority.  As part of our continuing mission to provide you with exceptional heart care, we have created designated Provider Care Teams.  These Care Teams include your primary Cardiologist (physician) and Advanced Practice Providers (APPs -  Physician Assistants and Nurse Practitioners) who all work together to provide you with the care you need, when you need it.  We recommend signing up for the patient portal called "MyChart".  Sign up information is provided on this After Visit Summary.  MyChart is used to connect with patients for Virtual Visits (Telemedicine).  Patients are able to view lab/test results, encounter notes, upcoming appointments, etc.  Non-urgent messages can be sent to your provider as well.   To learn more about what you can do with MyChart, go to [email or web address].    Your next appointment:   1 year(s)  Provider:   Olga Millers, MD    Other Instructions  Your physician wants you to follow-up in: 1 year.  You will receive a reminder letter in the mail two months in advance. If you don't receive a letter, please call our office to schedule the follow-up appointment.

## 2022-10-18 ENCOUNTER — Other Ambulatory Visit (HOSPITAL_BASED_OUTPATIENT_CLINIC_OR_DEPARTMENT_OTHER): Payer: Self-pay

## 2022-11-08 ENCOUNTER — Other Ambulatory Visit (HOSPITAL_BASED_OUTPATIENT_CLINIC_OR_DEPARTMENT_OTHER): Payer: Self-pay

## 2022-11-08 MED ORDER — FLULAVAL 0.5 ML IM SUSY
0.5000 mL | PREFILLED_SYRINGE | Freq: Once | INTRAMUSCULAR | 0 refills | Status: AC
  Filled 2022-11-08: qty 0.5, 1d supply, fill #0

## 2022-11-19 ENCOUNTER — Other Ambulatory Visit: Payer: Self-pay | Admitting: Family Medicine

## 2022-11-19 DIAGNOSIS — Z1231 Encounter for screening mammogram for malignant neoplasm of breast: Secondary | ICD-10-CM

## 2022-11-21 ENCOUNTER — Other Ambulatory Visit (HOSPITAL_BASED_OUTPATIENT_CLINIC_OR_DEPARTMENT_OTHER): Payer: Self-pay

## 2022-12-12 ENCOUNTER — Other Ambulatory Visit (HOSPITAL_BASED_OUTPATIENT_CLINIC_OR_DEPARTMENT_OTHER): Payer: Self-pay

## 2022-12-13 ENCOUNTER — Ambulatory Visit: Payer: Commercial Managed Care - PPO

## 2022-12-13 ENCOUNTER — Other Ambulatory Visit (HOSPITAL_BASED_OUTPATIENT_CLINIC_OR_DEPARTMENT_OTHER): Payer: Self-pay

## 2022-12-13 DIAGNOSIS — Z1231 Encounter for screening mammogram for malignant neoplasm of breast: Secondary | ICD-10-CM

## 2023-01-09 NOTE — Progress Notes (Unsigned)
     Established patient visit   Patient: Whitney Neal   DOB: 09/02/1960   62 y.o. Female  MRN: [id number] Visit Date: 01/10/2023  Today's healthcare provider: Charlton Amor, DO   No chief complaint on file.   SUBJECTIVE   No chief complaint on file.  HPI  Pt presents for HTN follow up  - on amlodipine 5mg    Prediabetes - on metformin 500xr  - atorvastatin 40mg   Vit D deficiency - 6 months ago level is 26, needs repeat today  Review of Systems     No outpatient medications have been marked as taking for the 01/10/23 encounter (Appointment) with Charlton Amor, DO.    OBJECTIVE    There were no vitals taken for this visit.  Physical Exam     ASSESSMENT & PLAN    Problem List Items Addressed This Visit   None   No follow-ups on file.      No orders of the defined types were placed in this encounter.   No orders of the defined types were placed in this encounter.    Charlton Amor, DO  [facility] Primary Care & Sports Medicine at [facility] [phone] (phone) [phone] (fax)  [facility]

## 2023-01-10 ENCOUNTER — Ambulatory Visit (INDEPENDENT_AMBULATORY_CARE_PROVIDER_SITE_OTHER): Payer: Commercial Managed Care - PPO | Admitting: Family Medicine

## 2023-01-10 ENCOUNTER — Other Ambulatory Visit (HOSPITAL_BASED_OUTPATIENT_CLINIC_OR_DEPARTMENT_OTHER): Payer: Self-pay

## 2023-01-10 ENCOUNTER — Encounter: Payer: Self-pay | Admitting: Family Medicine

## 2023-01-10 VITALS — BP 133/78 | HR 78 | Ht 60.0 in | Wt 150.5 lb

## 2023-01-10 DIAGNOSIS — R7303 Prediabetes: Secondary | ICD-10-CM

## 2023-01-10 DIAGNOSIS — E7800 Pure hypercholesterolemia, unspecified: Secondary | ICD-10-CM | POA: Diagnosis not present

## 2023-01-10 DIAGNOSIS — I10 Essential (primary) hypertension: Secondary | ICD-10-CM | POA: Diagnosis not present

## 2023-01-10 DIAGNOSIS — E559 Vitamin D deficiency, unspecified: Secondary | ICD-10-CM | POA: Diagnosis not present

## 2023-01-10 LAB — POCT GLYCOSYLATED HEMOGLOBIN (HGB A1C): Hemoglobin A1C: 6 % — AB (ref 4.0–5.6)

## 2023-01-10 MED ORDER — METFORMIN HCL ER 500 MG PO TB24
500.0000 mg | ORAL_TABLET | Freq: Every day | ORAL | 3 refills | Status: AC
Start: 2023-01-10 — End: 2024-01-10
  Filled 2023-01-10: qty 90, 90d supply, fill #0
  Filled 2023-04-16: qty 90, 90d supply, fill #1
  Filled 2023-05-30 – 2023-06-27 (×3): qty 90, 90d supply, fill #2

## 2023-01-10 MED ORDER — ATORVASTATIN CALCIUM 40 MG PO TABS
40.0000 mg | ORAL_TABLET | Freq: Every day | ORAL | 3 refills | Status: AC
  Filled 2023-01-10 – 2023-03-15 (×2): qty 90, 90d supply, fill #0
  Filled 2023-05-30: qty 90, 90d supply, fill #1

## 2023-01-10 MED ORDER — AMLODIPINE BESYLATE 5 MG PO TABS
5.0000 mg | ORAL_TABLET | Freq: Every day | ORAL | 2 refills | Status: AC
  Filled 2023-01-10 – 2023-03-15 (×2): qty 90, 90d supply, fill #0
  Filled 2023-05-30: qty 90, 90d supply, fill #1

## 2023-01-10 MED ORDER — CHOLECALCIFEROL 10 MCG (400 UNIT) PO TABS
400.0000 [IU] | ORAL_TABLET | Freq: Every day | ORAL | 0 refills | Status: DC
  Filled 2023-01-10: qty 100, 100d supply, fill #0

## 2023-01-10 NOTE — Assessment & Plan Note (Signed)
A1c 6.0 will continue metformin and consider taking a break at next visit. With the holidays coming up, I am hesitant to back off sugars.

## 2023-01-10 NOTE — Assessment & Plan Note (Signed)
Repeat vit d levels to ensure adequate vit d supplementation

## 2023-01-10 NOTE — Assessment & Plan Note (Signed)
-   bp well controlled will continue bp meds

## 2023-01-11 LAB — VITAMIN D 25 HYDROXY (VIT D DEFICIENCY, FRACTURES): Vit D, 25-Hydroxy: 89.5 ng/mL (ref 30.0–100.0)

## 2023-01-14 ENCOUNTER — Other Ambulatory Visit (HOSPITAL_BASED_OUTPATIENT_CLINIC_OR_DEPARTMENT_OTHER): Payer: Self-pay

## 2023-01-28 ENCOUNTER — Ambulatory Visit: Payer: Self-pay | Admitting: Family Medicine

## 2023-01-28 NOTE — Telephone Encounter (Signed)
  Chief Complaint: R flank pain Symptoms: "pinching" in R side Frequency: intermittent x 1 week Pertinent Negatives: Patient denies severe pain, difficulty urinating, abdominal pain, blood in urine, or fever Disposition: [] ED /[] Urgent Care (no appt availability in office) / [x] Appointment(In office/virtual)/ []  [place] [facility]/ [] Home Care/ [] Refused Recommended Disposition /[] [place] [facility]/ []  Follow-up with PCP Additional Notes: Pt calls stating she is experiencing intermittent R side flank pain for approx 1 week. States it feels like pinching when it occurs and rates pain 3/10. Denies NVD, abdominal pain, urinary difficulty or blood in urine. States she had a kidney stone approx 45 years ago while pregnant. Per protocol, pt to be seen within 3 days, states she is off work on 12/12 and would like appt for that date if possible. Pt scheduled with PCP for 01/31/23 @ 1340. Pt educated on worsening signs and symptoms, agreeable to plan. Alerting PCP for review.   Reason for Disposition  [1] MILD pain (i.e., scale 1-3; does not interfere with normal activities) AND [2] present > 3 days  Answer Assessment - Initial Assessment Questions 1. LOCATION: "Where does it hurt?" (e.g., left, right)     Right 2. ONSET: "When did the pain start?"     Last week, coming and going during that time 3. SEVERITY: "How bad is the pain?" (e.g., Scale 1-10; mild, moderate, or severe)   - MILD (1-3): doesn't interfere with normal activities    - MODERATE (4-7): interferes with normal activities or awakens from sleep    - SEVERE (8-10): excruciating pain and patient unable to do normal activities (stays in bed)       3/10 4. PATTERN: "Does the pain come and go, or is it constant?"      intermittent 5. CAUSE: "What do you think is causing the pain?"     Maybe a kidney stone 6. OTHER SYMPTOMS:  "Do you have any other symptoms?" (e.g., fever, abdomen pain, vomiting, leg weakness, burning with  urination, blood in urine)     denies  Protocols used: Flank Pain-A-AH

## 2023-01-29 NOTE — Telephone Encounter (Signed)
Patient has upcoming appt scheduled on 01/31/23.

## 2023-01-31 ENCOUNTER — Ambulatory Visit (INDEPENDENT_AMBULATORY_CARE_PROVIDER_SITE_OTHER): Payer: Commercial Managed Care - PPO | Admitting: Family Medicine

## 2023-01-31 ENCOUNTER — Other Ambulatory Visit (HOSPITAL_BASED_OUTPATIENT_CLINIC_OR_DEPARTMENT_OTHER): Payer: Self-pay

## 2023-01-31 ENCOUNTER — Encounter: Payer: Self-pay | Admitting: Family Medicine

## 2023-01-31 VITALS — BP 156/90 | HR 94 | Ht 60.0 in | Wt 154.8 lb

## 2023-01-31 DIAGNOSIS — E7800 Pure hypercholesterolemia, unspecified: Secondary | ICD-10-CM

## 2023-01-31 DIAGNOSIS — M8580 Other specified disorders of bone density and structure, unspecified site: Secondary | ICD-10-CM | POA: Diagnosis not present

## 2023-01-31 DIAGNOSIS — M545 Low back pain, unspecified: Secondary | ICD-10-CM | POA: Diagnosis not present

## 2023-01-31 DIAGNOSIS — R030 Elevated blood-pressure reading, without diagnosis of hypertension: Secondary | ICD-10-CM

## 2023-01-31 DIAGNOSIS — M5416 Radiculopathy, lumbar region: Secondary | ICD-10-CM | POA: Diagnosis not present

## 2023-01-31 LAB — POCT URINALYSIS DIP (CLINITEK)
Bilirubin, UA: NEGATIVE
Blood, UA: NEGATIVE
Glucose, UA: NEGATIVE mg/dL
Ketones, POC UA: NEGATIVE mg/dL
Leukocytes, UA: NEGATIVE
Nitrite, UA: NEGATIVE
POC PROTEIN,UA: NEGATIVE
Spec Grav, UA: 1.025 (ref 1.010–1.025)
Urobilinogen, UA: 0.2 U/dL
pH, UA: 5.5 (ref 5.0–8.0)

## 2023-01-31 MED ORDER — METHYLPREDNISOLONE 4 MG PO TBPK
ORAL_TABLET | ORAL | 0 refills | Status: AC
  Filled 2023-01-31: qty 21, 6d supply, fill #0

## 2023-01-31 NOTE — Assessment & Plan Note (Signed)
Pt bp elevated, likely related to pain

## 2023-01-31 NOTE — Assessment & Plan Note (Addendum)
Pt has radicular symptoms and due to this we will go ahead and start medrol dose pack to help nerve pain, likely strained from car ride. Recommended back stretches and ibuprofen, provided pt with back exercises.  - recommended lidocaine patches for her to use during the day and at work.  - given red flag symptoms to look out for - ua done to rule out bladder infection because patient was worried this could be part of the issue, UA negative

## 2023-01-31 NOTE — Assessment & Plan Note (Signed)
Repeat dexa scan due to osteopenia dx on previous dexa scan

## 2023-01-31 NOTE — Progress Notes (Signed)
Established patient visit   Patient: Whitney Neal   DOB: Jun 09, 1960   62 y.o. Female  MRN: [id number] Visit Date: 01/31/2023  Today's healthcare provider: Charlton Amor, DO   Chief Complaint  Patient presents with   Back Pain    Lower back pain x3wks both legs tingle all the way down     SUBJECTIVE    Chief Complaint  Patient presents with   Back Pain    Lower back pain x3wks both legs tingle all the way down    HPI HPI     Back Pain    Additional comments: Lower back pain x3wks both legs tingle all the way down       Last edited by Roselyn Reef, CMA on 01/31/2023  1:57 PM.      Pt presents with with low back pain for two weeks after driving from [place] for Thanksgiving.   Review of Systems  Constitutional:  Negative for activity change, fatigue and fever.  Respiratory:  Negative for cough and shortness of breath.   Cardiovascular:  Negative for chest pain.  Gastrointestinal:  Negative for abdominal pain.  Genitourinary:  Negative for difficulty urinating.  Musculoskeletal:  Positive for back pain.       Current Meds  Medication Sig   amLODipine (NORVASC) 5 MG tablet Take 1 tablet (5 mg total) by mouth daily.   atorvastatin (LIPITOR) 40 MG tablet Take 1 tablet (40 mg total) by mouth daily.   cholecalciferol (VITAMIN D3) 10 MCG (400 UNIT) TABS tablet Take 1 tablet (400 Units total) by mouth daily.   metFORMIN (GLUCOPHAGE-XR) 500 MG 24 hr tablet Take 1 tablet (500 mg total) by mouth daily with breakfast.   methylPREDNISolone (MEDROL DOSEPAK) 4 MG TBPK tablet Take as directed on package for 6 days.   Multiple Vitamins-Minerals (MULTIVITAMIN PO) Take 1 tablet by mouth daily.    OBJECTIVE    BP (!) 156/90 (BP Location: Left Arm, Patient Position: Sitting, Cuff Size: Normal)   Pulse 94   Ht 5' (1.524 m)   Wt 154 lb 12 oz (70.2 kg)   SpO2 99%   BMI 30.22 kg/m   Physical Exam Vitals and nursing note reviewed.  Constitutional:      General:  She is not in acute distress.    Appearance: Normal appearance.  HENT:     Head: Normocephalic and atraumatic.     Right Ear: External ear normal.     Left Ear: External ear normal.     Nose: Nose normal.  Eyes:     Conjunctiva/sclera: Conjunctivae normal.  Cardiovascular:     Rate and Rhythm: Normal rate and regular rhythm.  Pulmonary:     Effort: Pulmonary effort is normal.     Breath sounds: Normal breath sounds.  Musculoskeletal:     Comments: Lumbar paraspinal tenderness to palpation  Neurological:     General: No focal deficit present.     Mental Status: She is alert and oriented to person, place, and time.     Coordination: Coordination normal.  Psychiatric:        Mood and Affect: Mood normal.        Behavior: Behavior normal.        Thought Content: Thought content normal.        Judgment: Judgment normal.        ASSESSMENT & PLAN    Problem List Items Addressed This Visit       Nervous and Auditory  Lumbar radiculopathy - Primary   Pt has radicular symptoms and due to this we will go ahead and start medrol dose pack to help nerve pain, likely strained from car ride. Recommended back stretches and ibuprofen, provided pt with back exercises.  - recommended lidocaine patches for her to use during the day and at work.  - given red flag symptoms to look out for      Relevant Medications   methylPREDNISolone (MEDROL DOSEPAK) 4 MG TBPK tablet     Musculoskeletal and Integument   Osteopenia   Repeat dexa scan due to osteopenia dx on previous dexa scan      Relevant Orders   DG Bone Density     Other   Elevated blood pressure reading   Pt bp elevated, likely related to pain      Other Visit Diagnoses       Acute bilateral low back pain, unspecified whether sciatica present       Relevant Medications   methylPREDNISolone (MEDROL DOSEPAK) 4 MG TBPK tablet   Other Relevant Orders   POCT URINALYSIS DIP (CLINITEK) (Completed)     High cholesterol        Relevant Orders   Lipid panel       No follow-ups on file.      Meds ordered this encounter  Medications   methylPREDNISolone (MEDROL DOSEPAK) 4 MG TBPK tablet    Sig: Take as directed on package for 6 days.    Dispense:  21 tablet    Refill:  0    Orders Placed This Encounter  Procedures   DG Bone Density    Standing Status:   Future    Expiration Date:   01/31/2024    Reason for Exam (SYMPTOM  OR DIAGNOSIS REQUIRED):   Eval bone density    Preferred imaging location?:   [facility]   Lipid panel    Has the patient fasted?:   No    Release to patient:   Immediate   POCT URINALYSIS DIP (CLINITEK)     Charlton Amor, DO  [facility] Primary Care & Sports Medicine at [facility] [phone] (phone) [phone] (fax)  [facility]

## 2023-02-01 ENCOUNTER — Other Ambulatory Visit (HOSPITAL_BASED_OUTPATIENT_CLINIC_OR_DEPARTMENT_OTHER): Payer: Self-pay

## 2023-02-01 LAB — LIPID PANEL
Chol/HDL Ratio: 3.1 {ratio} (ref 0.0–4.4)
Cholesterol, Total: 156 mg/dL (ref 100–199)
HDL: 51 mg/dL (ref >39)
LDL Chol Calc (NIH): 89 mg/dL (ref 0–99)
Triglycerides: 86 mg/dL (ref 0–149)
VLDL Cholesterol Cal: 16 mg/dL (ref 5–40)

## 2023-02-27 ENCOUNTER — Ambulatory Visit: Payer: Commercial Managed Care - PPO

## 2023-02-27 DIAGNOSIS — M8580 Other specified disorders of bone density and structure, unspecified site: Secondary | ICD-10-CM

## 2023-02-27 DIAGNOSIS — M810 Age-related osteoporosis without current pathological fracture: Secondary | ICD-10-CM | POA: Diagnosis not present

## 2023-02-27 DIAGNOSIS — Z780 Asymptomatic menopausal state: Secondary | ICD-10-CM | POA: Diagnosis not present

## 2023-02-28 ENCOUNTER — Encounter: Payer: Self-pay | Admitting: Family Medicine

## 2023-03-01 ENCOUNTER — Other Ambulatory Visit: Payer: Self-pay | Admitting: Family Medicine

## 2023-03-01 ENCOUNTER — Other Ambulatory Visit (HOSPITAL_BASED_OUTPATIENT_CLINIC_OR_DEPARTMENT_OTHER): Payer: Self-pay

## 2023-03-01 ENCOUNTER — Other Ambulatory Visit: Payer: Self-pay

## 2023-03-01 MED ORDER — ALENDRONATE SODIUM 70 MG PO TABS
70.0000 mg | ORAL_TABLET | ORAL | 11 refills | Status: DC
  Filled 2023-03-01: qty 4, 28d supply, fill #0

## 2023-03-01 MED ORDER — CHOLECALCIFEROL 10 MCG (400 UNIT) PO TABS
400.0000 [IU] | ORAL_TABLET | Freq: Every day | ORAL | 0 refills | Status: AC
  Filled 2023-03-01: qty 100, 100d supply, fill #0

## 2023-03-04 ENCOUNTER — Other Ambulatory Visit (HOSPITAL_BASED_OUTPATIENT_CLINIC_OR_DEPARTMENT_OTHER): Payer: Self-pay

## 2023-03-05 ENCOUNTER — Other Ambulatory Visit: Payer: Self-pay | Admitting: Family Medicine

## 2023-03-06 ENCOUNTER — Other Ambulatory Visit (HOSPITAL_BASED_OUTPATIENT_CLINIC_OR_DEPARTMENT_OTHER): Payer: Self-pay

## 2023-03-08 ENCOUNTER — Telehealth: Payer: Self-pay

## 2023-03-08 NOTE — Telephone Encounter (Signed)
-----   Message from Whitney Neal sent at 03/05/2023 12:18 PM EST ----- Hi   Can we get her set up for prolia injections?  thanks

## 2023-03-08 NOTE — Telephone Encounter (Signed)
PA required for Prolia.

## 2023-03-15 ENCOUNTER — Other Ambulatory Visit: Payer: Self-pay | Admitting: Family Medicine

## 2023-03-15 ENCOUNTER — Other Ambulatory Visit (HOSPITAL_BASED_OUTPATIENT_CLINIC_OR_DEPARTMENT_OTHER): Payer: Self-pay

## 2023-03-15 ENCOUNTER — Other Ambulatory Visit: Payer: Self-pay

## 2023-03-15 DIAGNOSIS — K219 Gastro-esophageal reflux disease without esophagitis: Secondary | ICD-10-CM

## 2023-03-15 MED ORDER — OMEPRAZOLE 40 MG PO CPDR
DELAYED_RELEASE_CAPSULE | Freq: Every day | ORAL | 1 refills | Status: AC
  Filled 2023-03-15: qty 90, 90d supply, fill #0
  Filled 2023-05-30: qty 90, 90d supply, fill #1

## 2023-03-29 ENCOUNTER — Telehealth: Payer: Self-pay

## 2023-03-29 NOTE — Telephone Encounter (Signed)
 Prior auth for: PROLIA  60 MG Determination: Pending as of 03/29/23 Auth #: [id number] Valid from: n/a Patient notified via MyChart

## 2023-04-09 ENCOUNTER — Telehealth: Payer: Self-pay

## 2023-04-09 DIAGNOSIS — M810 Age-related osteoporosis without current pathological fracture: Secondary | ICD-10-CM

## 2023-04-09 NOTE — Telephone Encounter (Signed)
 Prior auth for: PROLIA  Determination: APPROVED Auth #: [id number] Valid from: 04/01/23 TO 03/30/24 Patient notified via MyChart

## 2023-04-16 ENCOUNTER — Other Ambulatory Visit (HOSPITAL_BASED_OUTPATIENT_CLINIC_OR_DEPARTMENT_OTHER): Payer: Self-pay

## 2023-04-18 ENCOUNTER — Telehealth: Payer: Self-pay

## 2023-04-18 ENCOUNTER — Ambulatory Visit
Admission: EM | Admit: 2023-04-18 | Discharge: 2023-04-18 | Disposition: A | Discharge status: Home / Self Care | Payer: Commercial Managed Care - PPO | Attending: Family Medicine | Admitting: Family Medicine

## 2023-04-18 ENCOUNTER — Other Ambulatory Visit (HOSPITAL_BASED_OUTPATIENT_CLINIC_OR_DEPARTMENT_OTHER): Payer: Self-pay

## 2023-04-18 DIAGNOSIS — H6691 Otitis media, unspecified, right ear: Secondary | ICD-10-CM

## 2023-04-18 DIAGNOSIS — M62838 Other muscle spasm: Secondary | ICD-10-CM | POA: Diagnosis not present

## 2023-04-18 MED ORDER — CEFDINIR 300 MG PO CAPS
300.0000 mg | ORAL_CAPSULE | Freq: Two times a day (BID) | ORAL | 0 refills | Status: AC
  Filled 2023-04-18: qty 14, 7d supply, fill #0

## 2023-04-18 MED ORDER — METHOCARBAMOL 500 MG PO TABS
500.0000 mg | ORAL_TABLET | Freq: Three times a day (TID) | ORAL | 0 refills | Status: DC | PRN
  Filled 2023-04-18: qty 30, 10d supply, fill #0

## 2023-04-18 NOTE — Telephone Encounter (Signed)
 Copied from CRM [id number]. Topic: Clinical - Medication Question >> Apr 18, 2023  8:52 AM Gery Pray wrote: Reason for CRM: Patient sent message on 02/21 and has not received a response. Reached out to [facility] and they sent a message back to  [place]. Was informed that when she sees it she will either respond through mychart or give the patient a callback.

## 2023-04-18 NOTE — Addendum Note (Signed)
 Addended by: Chalmers Cater on: 04/18/2023 01:40 PM   Modules accepted: Orders

## 2023-04-18 NOTE — ED Triage Notes (Signed)
 Pt states that she has some right sided neck pain and right ear pain. X1 week

## 2023-04-18 NOTE — ED Provider Notes (Signed)
 Whitney Neal CARE    CSN: [id number] Arrival date & time: 04/18/23  1346      History   Chief Complaint Chief Complaint  Patient presents with   Neck Pain    Right sided neck pain and right ear painx1 week    HPI Whitney Neal is a 63 y.o. female.   HPI 63 year old female presents with right sided neck pain and right ear pain for 1 week.  PMH significant for cervical strain, HTN, and osteopenia.  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  Normal EGD 2011, 2014   Cervical lymphadenopathy    left   Cervical strain    Constipation    Delayed bile leak s/p ERCP/stenting 03/14/2017    Diabetes mellitus without complication (HCC)    on meds   GERD (gastroesophageal reflux disease)    on PRN   Hepatic abscesses s/p perc drainage 04/04/2017 04/04/2017   Hypercholesteremia    on meds   Hyperglycemia    a1c 6.3 7/12   Hyperplastic colonic polyp    history of    Hypertension    on meds   Migraine    Osteopenia    PONV (postoperative nausea and vomiting)    Ulcer 2010   Urinary incontinence    Vitamin D deficiency     Patient Active Problem List   Diagnosis Date Noted   Lumbar radiculopathy 01/31/2023   Elevated blood pressure reading 01/31/2023   Medication management 07/05/2022   Prediabetes 07/01/2018   Need for shingles vaccine 12/30/2017   Hepatic abscesses s/p perc drainage 04/04/2017 04/04/2017   Nausea & vomiting 04/02/2017   Delayed bile leak s/p ERCP/stenting 03/14/2017    Acute on chronic cholecystitis s/p lap cholecystectomy 03/09/2017 03/09/2017   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 Ferdinand Cava - benign disease   BLADDER SURGERY     bladder mesh surgery 2010- Dr. Noland Fordyce   BREAST CYST ASPIRATION     COLONOSCOPY W/ POLYPECTOMY  2011   ENDOSCOPIC RETROGRADE CHOLANGIOPANCREATOGRAPHY (ERCP) WITH PROPOFOL N/A 05/03/2017   Procedure: ENDOSCOPIC RETROGRADE CHOLANGIOPANCREATOGRAPHY (ERCP) WITH PROPOFOL;  Surgeon: Jeani Hawking, MD;  Location: [facility] END[facility]COPY;  Service: Endoscopy;  Laterality: N/A;   ERCP N/A 03/13/2017   Procedure: ENDOSCOPIC RETROGRADE CHOLANGIOPANCREATOGRAPHY (ERCP);  Surgeon: Jeani Hawking, MD;  Location: [facility] ENDOSCOPY;  Service: Endoscopy;  Laterality: N/A;   ERCP N/A 03/14/2017   Procedure: ENDOSCOPIC RETROGRADE CHOLANGIOPANCREATOGRAPHY (ERCP);  Surgeon: Meryl Dare, MD;  Location: [facility];  Service: Endoscopy;  Laterality: N/A;   IR RADIOLOGIST EVAL & MGMT  04/16/2017   LAPAROSCOPIC CHOLECYSTECTOMY SINGLE PORT N/A 03/09/2017   Procedure: LAPAROSCOPIC CHOLECYSTECTOMY WITH INTRAOPERATIVE CHOLANGIOGRAM, CLOSURE OF ABERRANT DUCT OF  LUSCHKA;  Surgeon: Karie Soda, MD;  Location: [facility] ORS;  Service: General;  Laterality: N/A;    OB History   No obstetric history on file.      Home Medications    Prior to Admission medications   Medication Sig Start Date End Date Taking? Authorizing Provider  amLODipine (NORVASC) 5 MG tablet Take 1 tablet (5 mg total) by mouth daily. 01/10/23  Yes Tamera Punt, Erika S, DO  atorvastatin (LIPITOR) 40 MG tablet Take 1 tablet (40 mg total) by mouth daily. 01/10/23 01/10/24 Yes Morey Hummingbird S, DO  cefdinir (OMNICEF) 300 MG  capsule Take 1 capsule (300 mg total) by mouth 2 (two) times daily for 7 days. 04/18/23 04/25/23 Yes Trevor Iha, FNP  cholecalciferol (VITAMIN D3) 10 MCG (400 UNIT) TABS tablet Take 1 tablet (400 Units total) by mouth daily. 03/01/23  Yes Charlton Amor, DO  metFORMIN (GLUCOPHAGE-XR) 500 MG 24 hr tablet Take 1 tablet (500 mg total) by mouth daily with breakfast. 01/10/23 01/10/24 Yes Wachs, Erika S, DO  methocarbamol (ROBAXIN) 500 MG tablet Take 1 tablet (500 mg total) by mouth 3 (three) times daily as needed. 04/18/23  Yes Trevor Iha, FNP  Multiple Vitamins-Minerals (MULTIVITAMIN PO) Take 1 tablet by mouth daily.   Yes [provider]  omeprazole (PRILOSEC) 40 MG capsule TAKE 1 CAPSULE (40 MG TOTAL) BY MOUTH DAILY. 03/15/23 03/14/24 Yes Wachs, Colbert Coyer, DO  methylPREDNISolone (MEDROL DOSEPAK) 4 MG TBPK tablet Take as directed on package for 6 days. 01/31/23   Charlton Amor, DO    Family History Family History  Problem Relation Age of Onset   Hypertension Father    Heart attack Father 43   Prostate cancer Father    Pulmonary embolism Mother        deceased   Hypertension Mother    Diabetes Sister    Hypertension Brother    Hypertension Sister    Cancer Paternal Grandmother        oral   Thyroid disease Sister        1/2 sister thyroid problem   Healthy Daughter    Heart attack Brother 36   Breast cancer Cousin    Crohn's disease Neg Hx    Colon polyps Neg Hx    Esophageal cancer Neg Hx    Stomach cancer Neg Hx    Rectal cancer Neg Hx     Social History Social History   Tobacco Use   Smoking status: Never   Smokeless tobacco: Never  Vaping Use   Vaping status: Never Used  Substance Use Topics   Alcohol use: No   Drug use: No     Allergies   Codeine, Morphine, Morphine and codeine, Other, and Rocephin [ceftriaxone sodium in dextrose]   Review of Systems Review of Systems  HENT:  Positive for ear pain.   Musculoskeletal:        Right shoulder/right  sided neck pain x 1 week     Physical Exam Triage Vital Signs ED Triage Vitals  Encounter Vitals Group     BP 04/18/23 1440 (!) 169/97     Systolic BP Percentile --      Diastolic BP Percentile --      Pulse Rate 04/18/23 1440 79     Resp 04/18/23 1440 16     Temp 04/18/23 1440 97.8 F (36.6 C)     Temp Source 04/18/23 1440 Oral     SpO2 04/18/23 1440 99 %     Weight  04/18/23 1438 150 lb (68 kg)     Height 04/18/23 1438 5' (1.524 m)     Head Circumference --      Peak Flow --      Pain Score 04/18/23 1438 8     Pain Loc --      Pain Education --      Exclude from Growth Chart --    No data found.  Updated Vital Signs BP (!) 169/97 (BP Location: Left Arm)   Pulse 79   Temp 97.8 F (36.6 C) (Oral)   Resp 16   Ht 5' (1.524 m)   Wt 150 lb (68 kg)   SpO2 99%   BMI 29.29 kg/m    Physical Exam Vitals and nursing note reviewed.  Constitutional:      Appearance: Normal appearance. She is normal weight.  HENT:     Head: Normocephalic and atraumatic.     Right Ear: Ear canal and external ear normal.     Left Ear: Tympanic membrane, ear canal and external ear normal.     Ears:     Comments: Right TM: Erythematous, bulging    Mouth/Throat:     Mouth: Mucous membranes are moist.     Pharynx: Oropharynx is clear.  Eyes:     Extraocular Movements: Extraocular movements intact.     Conjunctiva/sclera: Conjunctivae normal.     Pupils: Pupils are equal, round, and reactive to light.  Cardiovascular:     Rate and Rhythm: Normal rate and regular rhythm.     Pulses: Normal pulses.     Heart sounds: Normal heart sounds.  Pulmonary:     Effort: Pulmonary effort is normal.     Breath sounds: Normal breath sounds. No wheezing, rhonchi or rales.  Musculoskeletal:        General: Normal range of motion.     Cervical back: Normal range of motion and neck supple.     Comments: Right trapezius: Significant palpable muscle adhesions with some soft tissue swelling noted  Skin:     General: Skin is warm and dry.  Neurological:     General: No focal deficit present.     Mental Status: She is alert and oriented to person, place, and time. Mental status is at baseline.  Psychiatric:        Mood and Affect: Mood normal.        Behavior: Behavior normal.        Thought Content: Thought content normal.      UC Treatments / Results  Labs (all labs ordered are listed, but only abnormal results are displayed) Labs Reviewed - No data to display  EKG   Radiology No results found.  Procedures Procedures (including critical care time)  Medications Ordered in UC Medications - No data to display  Initial Impression / Assessment and Plan / UC Course  I have reviewed the triage vital signs and the nursing notes.  Pertinent labs & imaging results that were available during my care of the patient were reviewed by me and considered in my medical decision making (see chart for details).     MDM: 1.  Acute right otitis media-Rx'd Augmentin 875/125 mg tablet: Take 1 tablet twice daily x 10 days; 2.  Trapezius muscle spasm-Rx'd Robaxin 500 mg tablet: Take 1 tablet 3 times daily, as needed.  Advised patient to take medication as directed with food to completion.  Advised may take Robaxin daily or as needed for accompanying right trapezius muscle spasm.  Encouraged to increase daily water intake to 64 ounces per day while taking these medications.  Advised if symptoms worsen and/or unresolved please follow-up with your PCP or here for further evaluation. Final Clinical Impressions(s) / UC Diagnoses   Final diagnoses:  Trapezius muscle spasm  Acute right otitis media     Discharge Instructions      Advised patient to take medication as directed with food to completion.  Advised may take Robaxin daily or as needed for accompanying right trapezius muscle spasm.  Encouraged to increase daily water intake to 64 ounces per day while taking these medications.  Advised if  symptoms worsen and/or unresolved please follow-up with your PCP or here for further evaluation.     ED Prescriptions     Medication Sig Dispense Auth. Provider   cefdinir (OMNICEF) 300 MG capsule Take 1 capsule (300 mg total) by mouth 2 (two) times daily for 7 days. 14 capsule Trevor Iha, FNP   methocarbamol (ROBAXIN) 500 MG tablet Take 1 tablet (500 mg total) by mouth 3 (three) times daily as needed. 30 tablet Trevor Iha, FNP      PDMP not reviewed this encounter.   Trevor Iha, FNP 04/18/23 1529

## 2023-04-18 NOTE — Telephone Encounter (Signed)
 Ordered labs for Prolia.   See other message from Dr Tamera Punt.

## 2023-04-18 NOTE — Telephone Encounter (Signed)
 Good afternoon,   I have my approval letter for my injection from my insurance provider. Do you need to see my copy ? When can I start to receive the treatment.     Thank you

## 2023-04-18 NOTE — Discharge Instructions (Addendum)
 Advised patient to take medication as directed with food to completion.  Advised may take Robaxin daily or as needed for accompanying right trapezius muscle spasm.  Encouraged to increase daily water intake to 64 ounces per day while taking these medications.  Advised if symptoms worsen and/or unresolved please follow-up with your PCP or here for further evaluation.

## 2023-04-19 LAB — CMP14+EGFR
ALT: 14 IU/L (ref 0–32)
AST: 21 IU/L (ref 0–40)
Albumin: 4.3 g/dL (ref 3.9–4.9)
Alkaline Phosphatase: 117 IU/L (ref 44–121)
BUN/Creatinine Ratio: 25 (ref 12–28)
BUN: 14 mg/dL (ref 8–27)
Bilirubin Total: 0.3 mg/dL (ref 0.0–1.2)
CO2: 23 mmol/L (ref 20–29)
Calcium: 9.4 mg/dL (ref 8.7–10.3)
Chloride: 105 mmol/L (ref 96–106)
Creatinine, Ser: 0.55 mg/dL — ABNORMAL LOW (ref 0.57–1.00)
Globulin, Total: 2.5 g/dL (ref 1.5–4.5)
Glucose: 88 mg/dL (ref 70–99)
Potassium: 4.5 mmol/L (ref 3.5–5.2)
Sodium: 142 mmol/L (ref 134–144)
Total Protein: 6.8 g/dL (ref 6.0–8.5)
eGFR: 102 mL/min/{1.73_m2} (ref >59)

## 2023-04-25 ENCOUNTER — Other Ambulatory Visit (HOSPITAL_COMMUNITY): Payer: Self-pay

## 2023-04-25 ENCOUNTER — Encounter (HOSPITAL_COMMUNITY): Payer: Self-pay

## 2023-04-25 ENCOUNTER — Ambulatory Visit: Payer: Commercial Managed Care - PPO | Admitting: Medical-Surgical

## 2023-04-25 ENCOUNTER — Other Ambulatory Visit: Payer: Self-pay

## 2023-04-25 ENCOUNTER — Other Ambulatory Visit (HOSPITAL_BASED_OUTPATIENT_CLINIC_OR_DEPARTMENT_OTHER): Payer: Self-pay

## 2023-04-25 VITALS — BP 138/80 | HR 88 | Ht 60.0 in | Wt 153.5 lb

## 2023-04-25 DIAGNOSIS — M8580 Other specified disorders of bone density and structure, unspecified site: Secondary | ICD-10-CM | POA: Diagnosis not present

## 2023-04-25 DIAGNOSIS — Z7984 Long term (current) use of oral hypoglycemic drugs: Secondary | ICD-10-CM

## 2023-04-25 DIAGNOSIS — M810 Age-related osteoporosis without current pathological fracture: Secondary | ICD-10-CM | POA: Diagnosis not present

## 2023-04-25 MED ORDER — DENOSUMAB 60 MG/ML ~~LOC~~ SOSY
60.0000 mg | PREFILLED_SYRINGE | Freq: Once | SUBCUTANEOUS | 0 refills | Status: DC
  Filled 2023-04-25 – 2023-04-26 (×2): qty 1, 1d supply, fill #0

## 2023-04-25 MED ORDER — DENOSUMAB 60 MG/ML ~~LOC~~ SOSY
60.0000 mg | PREFILLED_SYRINGE | Freq: Once | SUBCUTANEOUS | Status: DC
Start: 2023-05-09 — End: 2023-04-25
  Administered 2023-04-25: 60 mg via SUBCUTANEOUS

## 2023-04-25 NOTE — Progress Notes (Signed)
 Pt here today for her first Prolia injection. Pt is to get injection every 6 months Denies medication changes, headache, or SOB.       a                    Pt given prolia 60mg  in RUA. Tolerated well. No redness or swelling noted at the site. Advised pt to RTC in 6 months to get 2nd injection for the yr. ( Around 10/26/23)

## 2023-04-26 ENCOUNTER — Other Ambulatory Visit: Payer: Self-pay | Admitting: Pharmacist

## 2023-04-26 ENCOUNTER — Other Ambulatory Visit: Payer: Self-pay

## 2023-04-26 ENCOUNTER — Encounter (HOSPITAL_COMMUNITY): Payer: Self-pay

## 2023-04-26 ENCOUNTER — Telehealth: Payer: Self-pay | Admitting: Pharmacist

## 2023-04-26 ENCOUNTER — Encounter: Payer: Self-pay | Admitting: Pharmacist

## 2023-04-26 ENCOUNTER — Ambulatory Visit: Attending: Family Medicine | Admitting: Pharmacist

## 2023-04-26 DIAGNOSIS — Z79899 Other long term (current) drug therapy: Secondary | ICD-10-CM

## 2023-04-26 MED ORDER — DENOSUMAB 60 MG/ML ~~LOC~~ SOSY
60.0000 mg | PREFILLED_SYRINGE | Freq: Once | SUBCUTANEOUS | 0 refills | Status: AC
  Filled 2023-04-26 – 2023-04-29 (×2): qty 1, 1d supply, fill #0
  Filled 2023-04-29: qty 1, 30d supply, fill #0
  Filled 2023-04-29 – 2023-08-15 (×3): qty 1, 1d supply, fill #0

## 2023-04-26 NOTE — Telephone Encounter (Signed)
 Called patient to schedule an appointment for the [facility] [employer] [facility]. I was unable to reach the patient so I left a HIPAA-compliant message requesting that the patient return my call.   Butch Penny, PharmD, Patsy Baltimore, CPP Clinical Pharmacist [facility] & [facility] [phone]

## 2023-04-26 NOTE — Progress Notes (Signed)
 Pharmacy Patient Advocate Encounter   Received notification from Patient Pharmacy that prior authorization for Prolia is required/requested.   Insurance verification completed.   The patient is insured through [facility] .   Per test claim: PA required; PA submitted to above mentioned insurance via CoverMyMeds Key/confirmation #/EOC  [id number] Status is pending

## 2023-04-26 NOTE — Progress Notes (Signed)
 Please see OV from 04/26/2023 for complete documentation.  Butch Penny, PharmD, Patsy [place], CPP Clinical Pharmacist [facility] & [facility] [phone]

## 2023-04-26 NOTE — Progress Notes (Signed)
 S:  Patient presents for review of their specialty medication therapy.  Patient is currently taking Prolia for osteoporosis. Patient is managed by Dr. Tamera Punt for this.   Adherence: just started the medication yesterday  Efficacy: just started the medication yesterday  Dosing: 60mg  subcutaneously once every 6 months  Dose adjustments: Renal: Monitor patients with severe impairment (CrCl <30 mL/minute or on dialysis) closely, as significant and prolonged hypocalcemia (incidence of 29% and potentially lasting weeks to months) and marked elevations of serum parathyroid hormone are serious risks in this population. Ensure adequate calcium and vitamin D intake/supplementation. CrCl >=30 mL/minute: No dosage adjustment necessary. CrCl <30 mL/minute: No dosage adjustment necessary; use in conjunction with guidance from patient's nephrology team. Hepatic: no dose adjustments (has not been studied)  Drug-drug interactions: none identified  Screening: TB test: completed  Hepatitis: completed   Monitoring: S/sx of infection: none  S/sx of hypersensitivity: none  S/sx of hypocalcemia/hypercalcemia: none   Dermatitis/skin rash: none  Peripheral edema: none HA: none GI upset: none   Other side effects: none  Last bone density study: 02/27/23   O:      Lab Results  Component Value Date   WBC 3.9 07/05/2022   HGB 13.5 07/05/2022   HCT 40.8 07/05/2022   MCV 85.9 07/05/2022   PLT 329 07/05/2022      Chemistry      Component Value Date/Time   NA 142 04/18/2023 1346   K 4.5 04/18/2023 1346   CL 105 04/18/2023 1346   CO2 23 04/18/2023 1346   BUN 14 04/18/2023 1346   CREATININE 0.55 (L) 04/18/2023 1346   CREATININE 0.61 07/05/2022 1322      Component Value Date/Time   CALCIUM 9.4 04/18/2023 1346   ALKPHOS 117 04/18/2023 1346   AST 21 04/18/2023 1346   ALT 14 04/18/2023 1346   BILITOT 0.3 04/18/2023 1346       A/P: 1. Medication review: Patient currently on Prolia for  osteoporosis. Reviewed the medication with the patient, including the following: Prolia (denosumab) is a monoclonal antibody with affinity for nuclear factor-kappa ligand (RANKL). Prolia binds to RANKL and prevents osteoclast formation, leading to decreased bone resorption and increased bone mass in osteoporosis. Patient educated on purpose, proper use, and potential adverse effects of Prolia. The most common adverse effects are hypersensitivities, peripheral edema, dermatitis/skin rash, GI upset, HA, joint pain, and infection. There is the possibility of atypical femur fracture, serum calcium disturbances, and osteonecrosis of the jaw. Patients should monitor for and report hip, thigh, or groin pain. Additionally, patients should monitor for and report jaw pain, tooth/periodontal infection, toothache, and/or gingival ulceration/erosion. Prolia exists as a solution prefilled syringe for SQ administration. Administration: Denosumab is intended for SubQ route only and should not be administered IV, IM, or intradermally. Prior to administration, bring to room temperature in original container (allow to stand ~15 to 30 minutes); do not warm by any other method. Solution may contain trace amounts of translucent to white protein particles; do not use if cloudy, discolored (normal solution should be clear and colorless to pale yellow), or contains excessive particles or foreign matter. Avoid vigorous shaking. Administer via SubQ injection in the upper arm, upper thigh, or abdomen; should only be administered by a health care professional. No recommendations for any changes at this time.  Butch Penny, PharmD, Patsy [place], CPP Clinical Pharmacist [facility] & [facility] [phone]

## 2023-04-29 ENCOUNTER — Other Ambulatory Visit: Payer: Self-pay

## 2023-04-29 ENCOUNTER — Other Ambulatory Visit (HOSPITAL_BASED_OUTPATIENT_CLINIC_OR_DEPARTMENT_OTHER): Payer: Self-pay

## 2023-04-29 ENCOUNTER — Other Ambulatory Visit (HOSPITAL_COMMUNITY): Payer: Self-pay

## 2023-04-29 NOTE — Progress Notes (Addendum)
 Pharmacy Patient Advocate Encounter  Received notification from [facility] that Prior Authorization for Prolia has been APPROVED from 04/26/23 to 04/25/24   PA #/Case ID/Reference #: [id number]

## 2023-04-29 NOTE — Telephone Encounter (Signed)
 Left patient a voicemail to contact the office back and ask for Anjelica 04/29/23 at 9:29am - follow up from Dr [facility]

## 2023-04-29 NOTE — Progress Notes (Signed)
 Patient does not need [place] to fill until September. Appointment is 10/28/23.

## 2023-04-29 NOTE — Progress Notes (Signed)
 Pharmacy Patient Advocate Encounter  Insurance verification completed.   The patient is insured through [facility]   Ran test claim for Prolia. Currently a quantity of 1ml is a 30 day supply and the co-pay is $0. Patient has Prolia copay card.   This test claim was processed through [facility]- copay amounts may vary at other pharmacies due to pharmacy/plan contracts, or as the patient moves through the different stages of their insurance plan.

## 2023-05-07 NOTE — Telephone Encounter (Signed)
 Prior auth for: PROLIA  Determination: APPROVED Auth #: [id number] Valid from: 04/01/23 TO 03/30/24 Patient notified via MyChart

## 2023-05-30 ENCOUNTER — Other Ambulatory Visit: Payer: Self-pay

## 2023-05-30 ENCOUNTER — Other Ambulatory Visit: Payer: Self-pay | Admitting: Family Medicine

## 2023-05-30 ENCOUNTER — Other Ambulatory Visit (HOSPITAL_BASED_OUTPATIENT_CLINIC_OR_DEPARTMENT_OTHER): Payer: Self-pay

## 2023-05-30 MED ORDER — METHOCARBAMOL 500 MG PO TABS
500.0000 mg | ORAL_TABLET | Freq: Three times a day (TID) | ORAL | 0 refills | Status: AC | PRN
  Filled 2023-05-30 – 2023-06-01 (×2): qty 30, 10d supply, fill #0

## 2023-05-31 ENCOUNTER — Other Ambulatory Visit (HOSPITAL_BASED_OUTPATIENT_CLINIC_OR_DEPARTMENT_OTHER): Payer: Self-pay

## 2023-06-01 ENCOUNTER — Other Ambulatory Visit (HOSPITAL_BASED_OUTPATIENT_CLINIC_OR_DEPARTMENT_OTHER): Payer: Self-pay

## 2023-06-03 ENCOUNTER — Other Ambulatory Visit (HOSPITAL_BASED_OUTPATIENT_CLINIC_OR_DEPARTMENT_OTHER): Payer: Self-pay

## 2023-06-14 NOTE — Telephone Encounter (Signed)
 Task completed. Patient received her 1st Prolia  injection. Scheduled in 6 months for her next injection.

## 2023-06-17 ENCOUNTER — Other Ambulatory Visit (HOSPITAL_BASED_OUTPATIENT_CLINIC_OR_DEPARTMENT_OTHER): Payer: Self-pay

## 2023-06-27 ENCOUNTER — Other Ambulatory Visit (HOSPITAL_BASED_OUTPATIENT_CLINIC_OR_DEPARTMENT_OTHER): Payer: Self-pay

## 2023-06-27 ENCOUNTER — Encounter: Payer: Self-pay | Admitting: Family Medicine

## 2023-07-04 ENCOUNTER — Other Ambulatory Visit: Payer: Self-pay

## 2023-07-11 ENCOUNTER — Other Ambulatory Visit (HOSPITAL_BASED_OUTPATIENT_CLINIC_OR_DEPARTMENT_OTHER): Payer: Self-pay

## 2023-07-18 ENCOUNTER — Ambulatory Visit: Payer: Commercial Managed Care - PPO | Admitting: Family Medicine

## 2023-08-02 ENCOUNTER — Other Ambulatory Visit: Payer: Self-pay

## 2023-08-15 ENCOUNTER — Other Ambulatory Visit: Payer: Self-pay

## 2023-08-15 ENCOUNTER — Other Ambulatory Visit (HOSPITAL_BASED_OUTPATIENT_CLINIC_OR_DEPARTMENT_OTHER): Payer: Self-pay

## 2023-08-26 ENCOUNTER — Telehealth: Payer: Self-pay | Admitting: Family Medicine

## 2023-08-26 NOTE — Telephone Encounter (Signed)
 Copied from CRM [id number]. Topic: Clinical - Prescription Issue >> Aug 26, 2023  3:29 PM Emylou G wrote:  Reason for CRM: Whitney Neal w/[facility] Rcvd invalid script for Prolia ?  Can we resend it? Pls call if you need to [phone] fax [phone]

## 2023-08-27 ENCOUNTER — Other Ambulatory Visit: Payer: Self-pay

## 2023-08-28 NOTE — Telephone Encounter (Signed)
 Copied from CRM [id number]. Topic: Clinical - Prescription Issue >> Aug 28, 2023 10:09 AM Turkey B wrote:  Reason for CRM: Hadassah from [facility] states needs correct directions for med, Prolia ,l she states prescriber is Dr Jegede, ut pt is a patient at [facility] location. Diretions states once fo ronce does, but normallyis uposed to be 1 every6 months

## 2023-08-30 ENCOUNTER — Other Ambulatory Visit: Payer: Self-pay

## 2023-09-05 ENCOUNTER — Other Ambulatory Visit: Payer: Self-pay

## 2023-09-05 ENCOUNTER — Other Ambulatory Visit: Payer: Self-pay | Admitting: Family Medicine

## 2023-09-05 NOTE — Telephone Encounter (Signed)
 Per the chart notes, a new PA was submitted and approved by the Rx auth team. The patient had the first Prolia  injection done at [facility] pharmacy on 04/26/23. Next injection due 09/25. The CRM note has been forwarded to the Rx auth team to resolve.

## 2023-09-05 NOTE — Telephone Encounter (Signed)
 Have you heard from the pharmacy concerning this? This got routed to me and I'm not sure what the issue is.

## 2023-09-06 ENCOUNTER — Other Ambulatory Visit: Payer: Self-pay

## 2023-09-09 ENCOUNTER — Other Ambulatory Visit (HOSPITAL_COMMUNITY): Payer: Self-pay

## 2023-09-09 ENCOUNTER — Other Ambulatory Visit: Payer: Self-pay

## 2023-09-27 ENCOUNTER — Other Ambulatory Visit: Payer: Self-pay

## 2023-10-14 ENCOUNTER — Other Ambulatory Visit: Payer: Self-pay | Admitting: Family Medicine

## 2023-10-14 ENCOUNTER — Other Ambulatory Visit: Payer: Self-pay

## 2023-10-15 ENCOUNTER — Other Ambulatory Visit: Payer: Self-pay

## 2023-10-16 ENCOUNTER — Other Ambulatory Visit: Payer: Self-pay

## 2023-10-18 ENCOUNTER — Other Ambulatory Visit: Payer: Self-pay

## 2023-10-22 ENCOUNTER — Other Ambulatory Visit: Payer: Self-pay

## 2023-10-23 ENCOUNTER — Other Ambulatory Visit: Payer: Self-pay

## 2023-10-23 ENCOUNTER — Telehealth: Payer: Self-pay

## 2023-10-23 NOTE — Telephone Encounter (Signed)
 Spoke with patient. She has moved to [place] - she will be getting the Prolia  injection with new provider there.   Florence Riggs with specialty pharmacy  and left MRN # for patient and detailed message on confidential voice mail

## 2023-10-23 NOTE — Telephone Encounter (Signed)
 Copied from CRM [id number]. Topic: Clinical - Prescription Issue >> Oct 23, 2023  1:00 PM Suzette B wrote: Reason for CRM: Annabella [phone] [facility] Prolia  injection and needing prescription for the patient

## 2023-10-24 ENCOUNTER — Other Ambulatory Visit: Payer: Self-pay

## 2023-10-24 NOTE — Progress Notes (Signed)
 Patient has moved to [place] and has a new provider for Prolia . They will take over Prolia  services for pt. Dis-enrolling.

## 2023-10-28 ENCOUNTER — Ambulatory Visit

## 2024-01-14 ENCOUNTER — Other Ambulatory Visit (HOSPITAL_COMMUNITY): Payer: Self-pay
